# Patient Record
Sex: Female | Born: 1950 | Hispanic: Yes | Marital: Married | Smoking: Never smoker
Health system: Southern US, Community
[De-identification: ages and names within clinical notes are randomized; demographics above are authoritative.]

## PROBLEM LIST (undated history)

## (undated) DIAGNOSIS — H269 Unspecified cataract: Secondary | ICD-10-CM

## (undated) DIAGNOSIS — T7840XA Allergy, unspecified, initial encounter: Secondary | ICD-10-CM

## (undated) DIAGNOSIS — M19071 Primary osteoarthritis, right ankle and foot: Secondary | ICD-10-CM

## (undated) DIAGNOSIS — M810 Age-related osteoporosis without current pathological fracture: Secondary | ICD-10-CM

## (undated) DIAGNOSIS — M1990 Unspecified osteoarthritis, unspecified site: Secondary | ICD-10-CM

## (undated) DIAGNOSIS — H524 Presbyopia: Secondary | ICD-10-CM

## (undated) DIAGNOSIS — E7800 Pure hypercholesterolemia, unspecified: Secondary | ICD-10-CM

## (undated) DIAGNOSIS — H52203 Unspecified astigmatism, bilateral: Secondary | ICD-10-CM

## (undated) DIAGNOSIS — Z87442 Personal history of urinary calculi: Secondary | ICD-10-CM

## (undated) DIAGNOSIS — H5203 Hypermetropia, bilateral: Secondary | ICD-10-CM

## (undated) DIAGNOSIS — I10 Essential (primary) hypertension: Secondary | ICD-10-CM

## (undated) DIAGNOSIS — H11003 Unspecified pterygium of eye, bilateral: Secondary | ICD-10-CM

## (undated) DIAGNOSIS — I4891 Unspecified atrial fibrillation: Secondary | ICD-10-CM

## (undated) HISTORY — DX: Unspecified pterygium of eye, bilateral: H11.003

## (undated) HISTORY — PX: CHOLECYSTECTOMY: SHX55

## (undated) HISTORY — DX: Presbyopia: H52.4

## (undated) HISTORY — DX: Essential (primary) hypertension: I10

## (undated) HISTORY — DX: Age-related osteoporosis without current pathological fracture: M81.0

## (undated) HISTORY — DX: Unspecified osteoarthritis, unspecified site: M19.90

## (undated) HISTORY — DX: Unspecified cataract: H26.9

## (undated) HISTORY — DX: Unspecified astigmatism, bilateral: H52.203

## (undated) HISTORY — DX: Allergy, unspecified, initial encounter: T78.40XA

## (undated) HISTORY — DX: Hypermetropia, bilateral: H52.03

---

## 2004-05-18 ENCOUNTER — Inpatient Hospital Stay (HOSPITAL_COMMUNITY): Admission: EM | Admit: 2004-05-18 | Discharge: 2004-05-22 | Payer: Self-pay | Admitting: Emergency Medicine

## 2004-12-24 ENCOUNTER — Emergency Department (HOSPITAL_COMMUNITY): Admission: EM | Admit: 2004-12-24 | Discharge: 2004-12-24 | Payer: Self-pay | Admitting: Emergency Medicine

## 2007-01-23 LAB — CONVERTED CEMR LAB: Pap Smear: NORMAL

## 2007-09-11 ENCOUNTER — Telehealth (INDEPENDENT_AMBULATORY_CARE_PROVIDER_SITE_OTHER): Payer: Self-pay | Admitting: *Deleted

## 2007-09-11 DIAGNOSIS — M25579 Pain in unspecified ankle and joints of unspecified foot: Secondary | ICD-10-CM

## 2007-09-17 ENCOUNTER — Ambulatory Visit: Payer: Self-pay | Admitting: *Deleted

## 2007-09-17 ENCOUNTER — Ambulatory Visit: Payer: Self-pay | Admitting: Nurse Practitioner

## 2007-09-17 DIAGNOSIS — L84 Corns and callosities: Secondary | ICD-10-CM | POA: Insufficient documentation

## 2007-09-17 DIAGNOSIS — M2140 Flat foot [pes planus] (acquired), unspecified foot: Secondary | ICD-10-CM | POA: Insufficient documentation

## 2007-09-17 LAB — CONVERTED CEMR LAB
ALT: 31 units/L (ref 0–35)
AST: 28 units/L (ref 0–37)
Albumin: 4.3 g/dL (ref 3.5–5.2)
Alkaline Phosphatase: 95 units/L (ref 39–117)
BUN: 14 mg/dL (ref 6–23)
Basophils Absolute: 0 10*3/uL (ref 0.0–0.1)
Basophils Relative: 0 % (ref 0–1)
CO2: 21 meq/L (ref 19–32)
Calcium: 9.4 mg/dL (ref 8.4–10.5)
Chloride: 106 meq/L (ref 96–112)
Glucose, Bld: 85 mg/dL (ref 70–99)
Lymphs Abs: 1.6 10*3/uL (ref 0.7–3.3)
MCHC: 32.8 g/dL (ref 30.0–36.0)
Neutrophils Relative %: 67 % (ref 43–77)
RBC: 4.47 M/uL (ref 3.87–5.11)
RDW: 13.2 % (ref 11.5–14.0)
Rhuematoid fact SerPl-aCnc: <20 intl units/mL (ref 0–20)
WBC: 6.8 10*3/uL (ref 4.0–10.5)

## 2007-09-23 ENCOUNTER — Ambulatory Visit (HOSPITAL_COMMUNITY): Admission: RE | Admit: 2007-09-23 | Discharge: 2007-09-23 | Payer: Self-pay | Admitting: Nurse Practitioner

## 2007-10-01 ENCOUNTER — Ambulatory Visit: Payer: Self-pay | Admitting: Nurse Practitioner

## 2007-10-01 DIAGNOSIS — M949 Disorder of cartilage, unspecified: Secondary | ICD-10-CM

## 2007-10-01 DIAGNOSIS — F4310 Post-traumatic stress disorder, unspecified: Secondary | ICD-10-CM | POA: Insufficient documentation

## 2007-10-01 DIAGNOSIS — M79609 Pain in unspecified limb: Secondary | ICD-10-CM

## 2007-10-01 DIAGNOSIS — M899 Disorder of bone, unspecified: Secondary | ICD-10-CM | POA: Insufficient documentation

## 2007-10-07 ENCOUNTER — Ambulatory Visit (HOSPITAL_COMMUNITY): Admission: RE | Admit: 2007-10-07 | Discharge: 2007-10-07 | Payer: Self-pay | Admitting: Family Medicine

## 2007-10-07 ENCOUNTER — Encounter (INDEPENDENT_AMBULATORY_CARE_PROVIDER_SITE_OTHER): Payer: Self-pay | Admitting: Nurse Practitioner

## 2007-10-12 ENCOUNTER — Encounter (INDEPENDENT_AMBULATORY_CARE_PROVIDER_SITE_OTHER): Payer: Self-pay | Admitting: Nurse Practitioner

## 2007-10-20 ENCOUNTER — Ambulatory Visit: Payer: Self-pay | Admitting: Cardiology

## 2007-10-20 ENCOUNTER — Encounter (INDEPENDENT_AMBULATORY_CARE_PROVIDER_SITE_OTHER): Payer: Self-pay | Admitting: Nurse Practitioner

## 2007-10-20 ENCOUNTER — Ambulatory Visit (HOSPITAL_COMMUNITY): Admission: RE | Admit: 2007-10-20 | Discharge: 2007-10-20 | Payer: Self-pay | Admitting: Cardiology

## 2007-11-04 ENCOUNTER — Ambulatory Visit: Payer: Self-pay

## 2007-11-04 ENCOUNTER — Encounter: Payer: Self-pay | Admitting: Cardiology

## 2007-11-12 ENCOUNTER — Ambulatory Visit: Payer: Self-pay | Admitting: Cardiology

## 2007-11-12 ENCOUNTER — Encounter (INDEPENDENT_AMBULATORY_CARE_PROVIDER_SITE_OTHER): Payer: Self-pay | Admitting: Nurse Practitioner

## 2007-12-11 ENCOUNTER — Encounter (INDEPENDENT_AMBULATORY_CARE_PROVIDER_SITE_OTHER): Payer: Self-pay | Admitting: Nurse Practitioner

## 2007-12-11 DIAGNOSIS — I447 Left bundle-branch block, unspecified: Secondary | ICD-10-CM

## 2007-12-15 ENCOUNTER — Ambulatory Visit: Payer: Self-pay | Admitting: Nurse Practitioner

## 2007-12-15 DIAGNOSIS — I10 Essential (primary) hypertension: Secondary | ICD-10-CM

## 2008-01-09 ENCOUNTER — Emergency Department (HOSPITAL_COMMUNITY): Admission: EM | Admit: 2008-01-09 | Discharge: 2008-01-09 | Payer: Self-pay | Admitting: Emergency Medicine

## 2008-01-09 DIAGNOSIS — M479 Spondylosis, unspecified: Secondary | ICD-10-CM | POA: Insufficient documentation

## 2008-03-15 ENCOUNTER — Ambulatory Visit: Payer: Self-pay | Admitting: Nurse Practitioner

## 2008-03-15 LAB — CONVERTED CEMR LAB
Bilirubin Urine: NEGATIVE
KOH Prep: NEGATIVE
Ketones, urine, test strip: NEGATIVE
Pap Smear: NEGATIVE
Protein, U semiquant: NEGATIVE
Specific Gravity, Urine: <1.005
Urobilinogen, UA: 0.2

## 2008-03-16 ENCOUNTER — Ambulatory Visit (HOSPITAL_COMMUNITY): Admission: RE | Admit: 2008-03-16 | Discharge: 2008-03-16 | Payer: Self-pay | Admitting: Family Medicine

## 2008-03-16 ENCOUNTER — Encounter (INDEPENDENT_AMBULATORY_CARE_PROVIDER_SITE_OTHER): Payer: Self-pay | Admitting: Nurse Practitioner

## 2008-03-16 DIAGNOSIS — E785 Hyperlipidemia, unspecified: Secondary | ICD-10-CM

## 2008-03-16 LAB — CONVERTED CEMR LAB
ALT: 15 units/L (ref 0–35)
BUN: 18 mg/dL (ref 6–23)
Basophils Relative: 0 % (ref 0–1)
Calcium: 9.7 mg/dL (ref 8.4–10.5)
Chlamydia, DNA Probe: NEGATIVE
Eosinophils Absolute: 0.3 10*3/uL (ref 0.0–0.7)
Eosinophils Relative: 4 % (ref 0–5)
GC Probe Amp, Genital: NEGATIVE
HDL: 64 mg/dL (ref >39)
LDL Cholesterol: 162 mg/dL — ABNORMAL HIGH (ref 0–99)
Lymphs Abs: 1.5 10*3/uL (ref 0.7–4.0)
Monocytes Absolute: 0.6 10*3/uL (ref 0.1–1.0)
Neutro Abs: 4.3 10*3/uL (ref 1.7–7.7)
Neutrophils Relative %: 64 % (ref 43–77)
Platelets: 298 10*3/uL (ref 150–400)
RDW: 13.3 % (ref 11.5–15.5)
Sodium: 145 meq/L (ref 135–145)
Total CHOL/HDL Ratio: 3.9
Total Protein: 7.7 g/dL (ref 6.0–8.3)
Triglycerides: 127 mg/dL (ref <150)
VLDL: 25 mg/dL (ref 0–40)

## 2008-05-17 ENCOUNTER — Ambulatory Visit: Payer: Self-pay | Admitting: Nurse Practitioner

## 2008-05-17 DIAGNOSIS — R05 Cough: Secondary | ICD-10-CM | POA: Insufficient documentation

## 2008-06-15 ENCOUNTER — Ambulatory Visit: Payer: Self-pay | Admitting: Nurse Practitioner

## 2008-06-15 DIAGNOSIS — N200 Calculus of kidney: Secondary | ICD-10-CM

## 2008-06-15 LAB — CONVERTED CEMR LAB
Bilirubin Urine: NEGATIVE
Cholesterol, target level: 200 mg/dL
Specific Gravity, Urine: 1.015
Urobilinogen, UA: 0.2

## 2008-06-16 LAB — CONVERTED CEMR LAB
Alkaline Phosphatase: 84 units/L (ref 39–117)
Bilirubin, Direct: 0.1 mg/dL (ref 0.0–0.3)
Cholesterol: 215 mg/dL — ABNORMAL HIGH (ref 0–200)
HDL: 60 mg/dL (ref >39)
Total Bilirubin: 0.7 mg/dL (ref 0.3–1.2)
Total CHOL/HDL Ratio: 3.6
Total Protein: 7.3 g/dL (ref 6.0–8.3)
VLDL: 19 mg/dL (ref 0–40)

## 2008-08-16 ENCOUNTER — Telehealth (INDEPENDENT_AMBULATORY_CARE_PROVIDER_SITE_OTHER): Payer: Self-pay | Admitting: Internal Medicine

## 2008-08-17 ENCOUNTER — Encounter (INDEPENDENT_AMBULATORY_CARE_PROVIDER_SITE_OTHER): Payer: Self-pay | Admitting: Internal Medicine

## 2009-02-15 ENCOUNTER — Ambulatory Visit: Payer: Self-pay | Admitting: Nurse Practitioner

## 2009-02-15 DIAGNOSIS — R300 Dysuria: Secondary | ICD-10-CM | POA: Insufficient documentation

## 2009-02-15 LAB — CONVERTED CEMR LAB
Glucose, Urine, Semiquant: NEGATIVE
Ketones, urine, test strip: NEGATIVE
Nitrite: NEGATIVE
Protein, U semiquant: NEGATIVE
Urobilinogen, UA: 0.2
pH: 6.5

## 2009-02-16 ENCOUNTER — Encounter (INDEPENDENT_AMBULATORY_CARE_PROVIDER_SITE_OTHER): Payer: Self-pay | Admitting: Nurse Practitioner

## 2009-02-23 ENCOUNTER — Telehealth (INDEPENDENT_AMBULATORY_CARE_PROVIDER_SITE_OTHER): Payer: Self-pay | Admitting: *Deleted

## 2009-03-01 ENCOUNTER — Ambulatory Visit: Payer: Self-pay | Admitting: Nurse Practitioner

## 2009-03-01 LAB — CONVERTED CEMR LAB
ALT: 17 units/L (ref 0–35)
Albumin: 4.1 g/dL (ref 3.5–5.2)
Alkaline Phosphatase: 83 units/L (ref 39–117)
Basophils Absolute: 0 10*3/uL (ref 0.0–0.1)
Basophils Relative: 0 % (ref 0–1)
CO2: 17 meq/L — ABNORMAL LOW (ref 19–32)
Calcium: 8.9 mg/dL (ref 8.4–10.5)
Cholesterol: 229 mg/dL — ABNORMAL HIGH (ref 0–200)
Eosinophils Relative: 4 % (ref 0–5)
HCT: 41.1 % (ref 36.0–46.0)
HDL: 63 mg/dL (ref >39)
MCHC: 32.4 g/dL (ref 30.0–36.0)
Monocytes Relative: 6 % (ref 3–12)
Total Bilirubin: 0.6 mg/dL (ref 0.3–1.2)

## 2009-03-09 ENCOUNTER — Encounter (INDEPENDENT_AMBULATORY_CARE_PROVIDER_SITE_OTHER): Payer: Self-pay | Admitting: Nurse Practitioner

## 2009-03-29 ENCOUNTER — Ambulatory Visit: Payer: Self-pay | Admitting: Nurse Practitioner

## 2009-03-29 DIAGNOSIS — T148XXA Other injury of unspecified body region, initial encounter: Secondary | ICD-10-CM | POA: Insufficient documentation

## 2009-05-10 ENCOUNTER — Ambulatory Visit: Payer: Self-pay | Admitting: Nurse Practitioner

## 2009-05-10 ENCOUNTER — Encounter (INDEPENDENT_AMBULATORY_CARE_PROVIDER_SITE_OTHER): Payer: Self-pay | Admitting: Internal Medicine

## 2009-05-10 ENCOUNTER — Encounter (INDEPENDENT_AMBULATORY_CARE_PROVIDER_SITE_OTHER): Payer: Self-pay | Admitting: Nurse Practitioner

## 2009-05-10 DIAGNOSIS — E669 Obesity, unspecified: Secondary | ICD-10-CM

## 2009-05-10 LAB — CONVERTED CEMR LAB
Alkaline Phosphatase: 82 units/L (ref 39–117)
Chloride: 105 meq/L (ref 96–112)
Eosinophils Absolute: 0.2 10*3/uL (ref 0.0–0.7)
GC Probe Amp, Genital: NEGATIVE
Glucose, Bld: 70 mg/dL (ref 70–99)
Glucose, Urine, Semiquant: NEGATIVE
HDL: 62 mg/dL (ref >39)
Hemoglobin: 13.3 g/dL (ref 12.0–15.0)
KOH Prep: NEGATIVE
Ketones, urine, test strip: NEGATIVE
LDL Cholesterol: 113 mg/dL — ABNORMAL HIGH (ref 0–99)
MCHC: 32.8 g/dL (ref 30.0–36.0)
Microalb, Ur: 1.16 mg/dL (ref 0.00–1.89)
Monocytes Absolute: 0.4 10*3/uL (ref 0.1–1.0)
Monocytes Relative: 8 % (ref 3–12)
Potassium: 4.5 meq/L (ref 3.5–5.3)
Protein, U semiquant: NEGATIVE
RBC: 4.41 M/uL (ref 3.87–5.11)
Sodium: 142 meq/L (ref 135–145)
Specific Gravity, Urine: <1.005
TSH: 1.445 microintl units/mL (ref 0.350–4.500)
Total CHOL/HDL Ratio: 3.1
Total Protein: 7.5 g/dL (ref 6.0–8.3)
Triglycerides: 95 mg/dL (ref <150)
VLDL: 19 mg/dL (ref 0–40)
pH: 6.5

## 2009-05-11 ENCOUNTER — Encounter (INDEPENDENT_AMBULATORY_CARE_PROVIDER_SITE_OTHER): Payer: Self-pay | Admitting: Nurse Practitioner

## 2009-05-12 ENCOUNTER — Ambulatory Visit (HOSPITAL_COMMUNITY): Admission: RE | Admit: 2009-05-12 | Discharge: 2009-05-12 | Payer: Self-pay | Admitting: Family Medicine

## 2009-05-18 ENCOUNTER — Ambulatory Visit: Payer: Self-pay | Admitting: Internal Medicine

## 2009-07-05 ENCOUNTER — Encounter (INDEPENDENT_AMBULATORY_CARE_PROVIDER_SITE_OTHER): Payer: Self-pay | Admitting: Nurse Practitioner

## 2010-01-03 ENCOUNTER — Ambulatory Visit: Payer: Self-pay | Admitting: Nurse Practitioner

## 2010-01-03 DIAGNOSIS — H04129 Dry eye syndrome of unspecified lacrimal gland: Secondary | ICD-10-CM | POA: Insufficient documentation

## 2010-12-11 ENCOUNTER — Emergency Department (HOSPITAL_COMMUNITY)
Admission: EM | Admit: 2010-12-11 | Discharge: 2010-12-11 | Payer: Self-pay | Source: Home / Self Care | Admitting: Family Medicine

## 2011-01-22 NOTE — Assessment & Plan Note (Signed)
Summary: HTN/Hypercholesterolemia   Vital Signs:  Patient profile:   60 year old female Height:      59 inches Weight:      186 pounds BMI:     37.70 Temp:     98.1 degrees F oral Pulse rate:   57 / minute Pulse rhythm:   regular Resp:     18 per minute BP sitting:   145 / 77  (left arm) Cuff size:   large  Vitals Entered By: [name] [name] (January 03, 2010 10:28 AM) CC: refill meds.... pt said a couple of weeks ago she got dizzy...Marland Kitchen pt says she just got up to go to the bathroom and then she was dizzy...Marland KitchenMarland Kitchen pt says it might be cause she has not taken meds... pt has been out of meds for two months now....., Hypertension Management, Lipid Management Is Patient Diabetic? No Pain Assessment Patient in pain? no       Does patient need assistance? Functional Status Self care Ambulation Normal   CC:  refill meds.... pt said a couple of weeks ago she got dizzy...Marland Kitchen pt says she just got up to go to the bathroom and then she was dizzy...Marland KitchenMarland Kitchen pt says it might be cause she has not taken meds... pt has been out of meds for two months now....., Hypertension Management, and Lipid Management.  History of Present Illness:  Pt into the office for 6 month follow - up.  Pt here today with her daughter who interprets for her.  No medicatons for the past 2 months because she did not have refills on the meds.  Pt did go to the pharmacy but she was denied refills until this office visit.  Reviewed refill protocal with pt.  Hypertension History:      She denies headache, chest pain, and palpitations.  She notes no problems with any antihypertensive medication side effects.  Pt has been without her medications for the past 2 months.        Positive major cardiovascular risk factors include female age 10 years old or older, hyperlipidemia, and hypertension.  Negative major cardiovascular risk factors include no history of diabetes, negative family history for ischemic heart disease, and non-tobacco-user  status.        Further assessment for target organ damage reveals no history of ASHD, cardiac end-organ damage (CHF/LVH), stroke/TIA, peripheral vascular disease, renal insufficiency, or hypertensive retinopathy.    Lipid Management History:      Positive NCEP/ATP III risk factors include female age 72 years old or older and hypertension.  Negative NCEP/ATP III risk factors include non-diabetic, HDL cholesterol greater than 60, no family history for ischemic heart disease, non-tobacco-user status, no ASHD (atherosclerotic heart disease), no prior stroke/TIA, no peripheral vascular disease, and no history of aortic aneurysm.        The patient states that she does not know about the "Therapeutic Lifestyle Change" diet.  The patient does not know about adjunctive measures for cholesterol lowering.  She expresses no side effects from her lipid-lowering medication.  Comments include: Pt has not been taking her meds for the past 2 monts - needs refills.  The patient denies any symptoms to suggest myopathy or liver disease.      Current Medications (verified): 1)  Calcium Plus Vitamin D 600-100 Mg-Unit  Caps (Calcium Carbonate-Vitamin D) .Marland Kitchen.. 1 Tablet By Mouth By Mouth Two Times A Day For Bones 2)  Pravachol 40 Mg  Tabs (Pravastatin Sodium) .Marland Kitchen.. 1 Tablet By Mouth At Night For  Cholesterol 3)  Ultram 50 Mg  Tabs (Tramadol Hcl) .Marland Kitchen.. 1 Tablet By Mouth Daily As Needed For Pain 4)  Norvasc 10 Mg  Tabs (Amlodipine Besylate) .Marland Kitchen.. 1 Tablet By Mouth For Blood Pressure 5)  Alendronate Sodium 70 Mg Tabs (Alendronate Sodium) .Marland Kitchen.. 1 Tab By Mouth Weekly--Take On Empty Stomach.  Do Not Lie Down Afterward.  Do Not Eat or Take Other Meds For 1 Hour. 6)  Voltaren 1 % Gel (Diclofenac Sodium) .... Apply To Affected Area Two Times A Day As Needed For Pain  Allergies (verified): No Known Drug Allergies  Review of Systems General:  Denies fever. Eyes:  Complains of itching and red eye. CV:  Denies chest pain or  discomfort. Resp:  Denies cough. GI:  Denies abdominal pain, nausea, and vomiting. MS:  bil foot pain - flat feet.  Still wearing orthopedic shoes.  Physical Exam  General:  alert.   Head:  normocephalic.   Eyes:  conjunctival injection and pinguecula.   Lungs:  normal breath sounds.   Heart:  normal rate and regular rhythm.   Abdomen:  obese Msk:  normal ROM and pes planus.   Neurologic:  alert & oriented X3.     Impression & Recommendations:  Problem # 1:  HYPERTENSION, BENIGN ESSENTIAL (ICD-401.1) BP elevated today. DASH diet advised pt to restart on meds. Her updated medication list for this problem includes:    Norvasc 10 Mg Tabs (Amlodipine besylate) .Marland Kitchen... 1 tablet by mouth for blood pressure  Problem # 2:  DYSLIPIDEMIA (ICD-272.4) no need to check lipids today but pt has been off meds for 2 months advised her to restart meds Her updated medication list for this problem includes:    Pravachol 40 Mg Tabs (Pravastatin sodium) .Marland Kitchen... 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 1 tablet by mouth by mouth two times a day for bones 2)  Pravachol 40 Mg Tabs (Pravastatin sodium) .Marland Kitchen.. 1 tablet by mouth at night for cholesterol 3)  Ultram 50 Mg Tabs (Tramadol hcl) .Marland Kitchen.. 1 tablet by mouth daily as needed for pain 4)  Norvasc 10 Mg Tabs (Amlodipine besylate) .Marland Kitchen.. 1 tablet by mouth for blood pressure 5)  Alendronate Sodium 70 Mg Tabs (Alendronate sodium) .Marland Kitchen.. 1 tab by mouth weekly--take on empty stomach.  do not lie down afterward.  do not eat or take other meds for 1  hour. 6)  Voltaren 1 % Gel (Diclofenac sodium) .... Apply to affected area two times a day as needed for pain  Other Orders: Flu Vaccine 71yrs + (60454) Admin 1st Vaccine (09811) Admin 1st Vaccine [facility]) [phone])  Hypertension Assessment/Plan:      The patient's hypertensive risk group is category B: At least one risk factor (excluding diabetes) with no target organ damage.  Her calculated 10 year risk of coronary heart disease is 9 %.  Today's blood pressure is 145/77.  Her blood pressure goal is < 140/90.  Lipid Assessment/Plan:      Based on NCEP/ATP III, the patient's risk factor category is "0-1 risk factors".  The patient's lipid goals are as follows: Total  cholesterol goal is 200; LDL cholesterol goal is 160; HDL cholesterol goal is 40; Triglyceride goal is 150.    Patient Instructions: 1)  Restart your medications for cholesterol and bones.  2)  Schedule an appointment in 6 months for a complete physical exam. Do not eat before this visit.  You will need fasting labs. cbc, lipids, cmp, rapid hiv, tsh. 3)  You will get mammogram, PHQ-9, EKG,  4)  Dry eyes - likely due to dry heat in the house. 5)  Be sure that the filter in the heat is changed. 6)  Use either a humidifier or boil water on the stove to put some humidity in the air. 7)  May use saliene eye drops or Clear eyes Prescriptions: ALENDRONATE SODIUM 70 MG TABS (ALENDRONATE SODIUM) 1 tab by mouth weekly--take on empty stomach.  Do not lie down afterward.  Do not eat or take other meds for 1 hour.  #4 Each x 5   Entered and Authorized by:   Lehman Prom FNP   Signed by:   Lehman Prom FNP on 01/03/2010   Method used:   Print then Give to Patient   RxID:   [id number] ULTRAM 50 MG  TABS (TRAMADOL HCL) 1 tablet by mouth daily as needed for pain  #30 x 0   Entered and Authorized by:   Lehman Prom FNP   Signed by:   Lehman Prom FNP on 01/03/2010   Method used:   Faxed to ...       [facility]retail)       [address] [place], [place]  [address]       Ph: [phone]       Fax: [phone]   RxID:   [id number] NORVASC 10 MG  TABS (AMLODIPINE BESYLATE) 1 tablet by mouth for blood pressure  #30 x 6   Entered and Authorized by:   Lehman Prom FNP   Signed by:   Lehman Prom FNP on 01/03/2010   Method used:   Faxed to ...       [facility] (retail)       [address] [place], [place]  [address]       Ph: [phone]       Fax: [phone]   RxID:   [id number] PRAVACHOL 40 MG  TABS (PRAVASTATIN SODIUM) 1 tablet by mouth at night for cholesterol  #30 x 6   Entered and Authorized by:   Lehman Prom FNP   Signed by:   Lehman Prom FNP on 01/03/2010   Method used:   Faxed to ...       [facility] (retail)       [address] [place], [place]  [address]       Ph: [phone]       Fax: [phone]   RxID:   [id number]    Influenza Vaccine    Vaccine Type: Fluvax 3+    Site: right deltoid    Mfr: San[employer]i Pasteur    Dose: 0.5 ml    Route: IM    Given by: [name] [name]    Exp. Date: 06/21/2010    Lot #: [id number]    VIS given: 07/16/07 version given January 03, 2010.  Flu Vaccine Consent Questions    Do you have a history of severe  allergic reactions to this vaccine? no    Any prior history of allergic reactions to egg and/or gelatin? no    Do you have a sensitivity to the preservative Thimersol? no    Do you have a past history of Guillan-Barre Syndrome? no    Do you currently have an acute febrile illness? no    Have you ever had a severe reaction to latex? no    Vaccine information given and explained to patient? yes    Are you currently pregnant? no

## 2011-02-21 ENCOUNTER — Telehealth (INDEPENDENT_AMBULATORY_CARE_PROVIDER_SITE_OTHER): Payer: Self-pay | Admitting: Nurse Practitioner

## 2011-02-26 ENCOUNTER — Encounter (INDEPENDENT_AMBULATORY_CARE_PROVIDER_SITE_OTHER): Payer: Self-pay | Admitting: Internal Medicine

## 2011-03-05 NOTE — Progress Notes (Signed)
Summary: Query:  Refill alendronate?  Phone Note Outgoing Call   Summary of Call: Last seen 12/2009.  Refill alendronate or call pt. to make appt. before refill? Initial call taken by: Dutch Quint RN,  February 21, 2011 3:18 PM  Follow-up for Phone Call        med refilled it has been 1 year since pt's last appt so she needs an office visit Follow-up by: Lehman Prom FNP,  February 22, 2011 10:08 AM  Additional Follow-up for Phone Call Additional follow up Details #1::        Voicemail box has not been set up yet.  No message left.  Dutch Quint RN  February 25, 2011 4:40 PM  Voicemail box has not been set up yet.  No message left.  Letter sent.  Dutch Quint RN  February 26, 2011 2:44 PM

## 2011-03-05 NOTE — Letter (Signed)
Summary: Generic Letter  [facility]  [address] [place], [place] [address]   Phone: [phone]  Fax: [phone]        02/26/2011  Verdie Shire [address] [place], [place]  [address]  Dear Ms. VAZQUEZ-GARCIA,  Nosotros hemos trato de comunicarnos por telefono y no hemos podido.  Por favor llamar a la oficina, a su tiempo conveniente, para hablar con usted.  Sinceramente,  Dutch Quint RN

## 2011-05-07 NOTE — Assessment & Plan Note (Signed)
[facility]                            CARDIOLOGY OFFICE NOTE   NAME:[name], [name]                        MRN:          [id number]  DATE:11/12/2007                            DOB:          Apr 04, 1951    I had seen [name] [name] on October 20, 2007.  Her blood pressure  was elevated.  She had some shoulder discomfort.  We added Benazepril 10  mg and arranged for a chest x-ray.  This was done at [facility].  She  may have more than one medical record number.  We did obtain the result.  There was no acute abnormality.  She has some interstitial coarsening.  She has some scar versus atelectasis at the left base; however, there  was no acute abnormality.  She did have a 2D echo.  This study was done  on November 04, 2007 and showed good LV function.  There were no major  valvular abnormalities.   The patient returns today, and she is feeling better.  Also, her blood  pressure is much better.  She does not speak [place].  She is here with  her daughter, who can communicate well.   PAST MEDICAL HISTORY:  Other medical problems, see the list below.   ALLERGIES:  No known drug allergies.   MEDICATIONS:  Benazepril, piroxicam.   REVIEW OF SYSTEMS:  She feels much better, and her review of systems is  negative.   PHYSICAL EXAMINATION:  Blood pressure today is significantly improved at  127/75.  Her weight is down 2 pounds to 200.  Pulse is 61.  Patient is oriented to person, time, and place, and her affect is  normal.  I communicated with her through her daughter in the room.  LUNGS:  Clear.  Respiratory effort is not labored.  CARDIAC:  An S1 with an S2.  There are no clicks or significant murmurs.  ABDOMEN:  Soft.  She has no masses or bruits.  EXTREMITIES:  There is no peripheral edema.   PROBLEMS:  1. Normal left ventricular function.  2. Left bundle branch block.  3. Hypertension, treated.  4. Status post shortness of breath and a cough  that is improved.  No      further cardiac workup is needed.  I will see her as needed over      time at the request of [facility].    [name] Abed, MD, [facility]  Electronically Signed   JDK/MedQ  DD: 11/12/2007  DT: 11/12/2007  Job #: [id number]   cc:   [facility]

## 2011-05-07 NOTE — Assessment & Plan Note (Signed)
[facility]                            CARDIOLOGY OFFICE NOTE   NAME:[name]                MRN:          [id number]  DATE:10/20/2007                            DOB:          Oct 25, 1951    [name] is here for the evaluation of some discomfort in her  left shoulder and arm and also because her blood pressure is elevated  and because she has some shortness of breath and a cough. The  respiratory difficulty occurs at anytime. It does not appear to be  exertional. She does not have PND or orthopnea. She is also here because  her EKG is abnormal with an underlying left bundle branch block.   PAST MEDICAL HISTORY:   ALLERGIES:  No known drug allergies.   MEDICATIONS:  Feldene for arthritis. Also she has had calcium and Celexa  ordered but she has not started it.   OTHER MEDICAL PROBLEMS:  See the list below.   SOCIAL HISTORY:  The patient has 10 children. She does not smoke. She  does not drink.   FAMILY HISTORY:  There is no strong family history of coronary disease.   REVIEW OF SYSTEMS:  See the HPI.   PHYSICAL EXAMINATION:  Weight is 202 pounds, blood pressure is 179/93.  Pulse is 76.  The patient is oriented to person, time and place. The history is  obtained through using interpreters and her daughter.  HEENT:  Reveals no xanthelasma. She has normal extraocular motion. There  are no carotid bruits. There is no jugular venous distention.  LUNGS:  Clear. Respiratory effort is not labored.  CARDIAC:  Reveals an S1 with an S2. There are no clicks or significant  murmurs.  ABDOMEN:  Obese but soft.  She has no significant peripheral edema.   EKG reveals left bundle branch block.   LABORATORY DATA:  Her labs done in September 2008 through [facility]  revealed a hemoglobin of 13.5, potassium 3.9, BUN 14, creatinine 0.49.  TSH was normal.   PROBLEM LIST:  1. Hypertension. We will start Benazepril 10 mg daily.  2.  Other musculoskeletal problems that are being evaluated by      [facility].  3. Shortness of breath and cough. She needs a chest x-ray.  4. Left bundle branch block. We need a 2-D echo to assess her LV      function better. I will start her on Benazepril and then I will see      her for followup.     [name] Abed, MD, [facility]  Electronically Signed    JDK/MedQ  DD: 10/20/2007  DT: 10/21/2007  Job #: [id number]   cc:   Dala Dock

## 2011-05-24 ENCOUNTER — Inpatient Hospital Stay (INDEPENDENT_AMBULATORY_CARE_PROVIDER_SITE_OTHER)
Admission: RE | Admit: 2011-05-24 | Discharge: 2011-05-24 | Disposition: A | Discharge status: Home / Self Care | Payer: Self-pay | Source: Ambulatory Visit | Attending: Emergency Medicine | Admitting: Emergency Medicine

## 2011-05-24 DIAGNOSIS — I10 Essential (primary) hypertension: Secondary | ICD-10-CM

## 2011-05-24 LAB — POCT I-STAT, CHEM 8
BUN: 21 mg/dL (ref 6–23)
HCT: 40 % (ref 36.0–46.0)
Potassium: 4 mEq/L (ref 3.5–5.1)
Sodium: 140 mEq/L (ref 135–145)

## 2011-09-12 LAB — URINE CULTURE

## 2011-09-12 LAB — POCT URINALYSIS DIP (DEVICE)
Nitrite: NEGATIVE
Operator id: 270961
Urobilinogen, UA: 0.2
pH: 7

## 2013-07-12 ENCOUNTER — Ambulatory Visit: Payer: Self-pay | Admitting: Family Medicine

## 2013-07-12 VITALS — BP 174/82 | HR 77 | Temp 97.8°F | Resp 18 | Ht 60.0 in | Wt 186.0 lb

## 2013-07-12 DIAGNOSIS — I10 Essential (primary) hypertension: Secondary | ICD-10-CM

## 2013-07-12 DIAGNOSIS — M25569 Pain in unspecified knee: Secondary | ICD-10-CM

## 2013-07-12 LAB — BASIC METABOLIC PANEL
Calcium: 9.3 mg/dL (ref 8.4–10.5)
Glucose, Bld: 89 mg/dL (ref 70–99)
Sodium: 137 mEq/L (ref 135–145)

## 2013-07-12 MED ORDER — LISINOPRIL 10 MG PO TABS: 10.0000 mg | ORAL_TABLET | Freq: Every day | ORAL | Status: DC

## 2013-07-12 MED ORDER — PIROXICAM 20 MG PO CAPS: 20.0000 mg | ORAL_CAPSULE | Freq: Every day | ORAL | Status: DC

## 2013-07-12 NOTE — Progress Notes (Signed)
[facility] [address], [place] [address] [phone]  Date:  07/12/2013   Name:  [name] [name]   DOB:  Mar 18, 1951   MRN:  [id number]  PCP:  Julieanne Manson, MD    Chief Complaint: Hypertension and Dizziness   History of Present Illness:  [name] [name] is a 62 y.o. very pleasant female patient who presents with the following:  She has been out of her BP medications for more than 6 months. She has been on BP medications for a long time- she was on benazepril but has run out.  However, the benazepril is expensive and she would like to change to something else if possible  She is post- menopausal She has been on celexa in the past but no longer feels that she needs this.   She is here with her family today.  No other concerns except she has also used feldene in the past for occasional MSK pains and would like to have some more of this if possible.    Last BMP in 2012 per epic  Patient Active Problem List   Diagnosis Date Noted  . 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  . OBESITY 05/10/2009  . STRAIN 03/29/2009  . DYSURIA 02/15/2009  . NEPHROLITHIASIS 06/15/2008  . COUGH 05/17/2008  . DYSLIPIDEMIA 03/16/2008  . 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 HYPERTENSION, BENIGN ESSENTIAL 12/15/2007  . BUNDLE BRANCH BLOCK, LEFT 12/11/2007  . PTSD 10/01/2007  . ARM PAIN, LEFT 10/01/2007  . OSTEOPENIA 10/01/2007  . CALLUSES, FEET, BILATERAL 09/17/2007  . PES PLANUS 09/17/2007  . PAIN IN JOINT, ANKLE/FOOT 09/11/2007    Past Medical History  Diagnosis Date  . Arthritis   . Osteoporosis     History reviewed. No pertinent past surgical history.  History  Substance Use Topics  . Smoking status: Never Smoker   . Smokeless tobacco: Not on file  . Alcohol Use: No    History reviewed. No pertinent family history.  No Known Allergies  Medication list has been reviewed and updated.  No current outpatient prescriptions on file prior to  visit.   No current facility-administered medications on file prior to visit.    Review of Systems:  As per HPI- otherwise negative.   Physical Examination: Filed Vitals:   07/12/13 1258  BP: 174/82  Pulse: 77  Temp: 97.8 F (36.6 C)  Resp: 18   Filed Vitals:   07/12/13 1258  Height: 5' (1.524 m)  Weight: 186 lb (84.369 kg)   Body mass index is 36.33 kg/(m^2). Ideal Body Weight: Weight in (lb) to have BMI = 25: 127.7  GEN: WDWN, NAD, Non-toxic, A & O x 3, obese HEENT: Atraumatic, Normocephalic. Neck supple. No masses, No LAD. Ears and Nose: No external deformity. CV: RRR, No M/G/R. No JVD. No thrill. No extra heart sounds. PULM: CTA B, no wheezes, crackles, rhonchi. No retractions. No resp. distress. No accessory muscle use. ABD: S, NT, ND. No rebound. No HSM. EXTR: No c/c/e NEURO Normal gait.  PSYCH: Normally interactive. Conversant. Not depressed or anxious appearing.  Calm demeanor.    Assessment and Plan: HTN (hypertension) - Plan: Basic metabolic panel, lisinopril (PRINIVIL,ZESTRIL) 10 MG tablet, DISCONTINUED: lisinopril (PRINIVIL,ZESTRIL) 10 MG tablet  Pain in joint, lower leg, unspecified laterality - Plan: piroxicam (FELDENE) 20 MG capsule, DISCONTINUED: piroxicam (FELDENE) 20 MG capsule  Restart BP medication; the benazapril has been expensive for her, so will change to lisinopril. Asked her to check her BP at home and record her  readings, and plan to come back in one month for a recheck  She has used feldene on occasion for MSK pain. Gave her some more of these but asked her to use sparingly as they can raise her BP.   Signed Abbe Amsterdam, MD

## 2013-07-13 ENCOUNTER — Encounter: Payer: Self-pay | Admitting: Family Medicine

## 2013-09-03 ENCOUNTER — Ambulatory Visit: Payer: No Typology Code available for payment source | Attending: Internal Medicine

## 2013-09-27 ENCOUNTER — Ambulatory Visit: Payer: No Typology Code available for payment source | Attending: Internal Medicine | Admitting: Internal Medicine

## 2013-09-27 ENCOUNTER — Other Ambulatory Visit (HOSPITAL_COMMUNITY)
Admission: RE | Admit: 2013-09-27 | Discharge: 2013-09-27 | Disposition: A | Discharge status: Home / Self Care | Payer: No Typology Code available for payment source | Source: Ambulatory Visit | Attending: Internal Medicine | Admitting: Internal Medicine

## 2013-09-27 ENCOUNTER — Encounter: Payer: Self-pay | Admitting: Internal Medicine

## 2013-09-27 VITALS — BP 154/82 | HR 58 | Temp 97.8°F | Resp 17

## 2013-09-27 DIAGNOSIS — Z01419 Encounter for gynecological examination (general) (routine) without abnormal findings: Secondary | ICD-10-CM | POA: Insufficient documentation

## 2013-09-27 DIAGNOSIS — Z23 Encounter for immunization: Secondary | ICD-10-CM

## 2013-09-27 DIAGNOSIS — Z0000 Encounter for general adult medical examination without abnormal findings: Secondary | ICD-10-CM

## 2013-09-27 DIAGNOSIS — E785 Hyperlipidemia, unspecified: Secondary | ICD-10-CM

## 2013-09-27 DIAGNOSIS — M79609 Pain in unspecified limb: Secondary | ICD-10-CM | POA: Insufficient documentation

## 2013-09-27 DIAGNOSIS — N63 Unspecified lump in unspecified breast: Secondary | ICD-10-CM

## 2013-09-27 DIAGNOSIS — Z1151 Encounter for screening for human papillomavirus (HPV): Secondary | ICD-10-CM | POA: Insufficient documentation

## 2013-09-27 DIAGNOSIS — I10 Essential (primary) hypertension: Secondary | ICD-10-CM

## 2013-09-27 DIAGNOSIS — M2140 Flat foot [pes planus] (acquired), unspecified foot: Secondary | ICD-10-CM | POA: Insufficient documentation

## 2013-09-27 DIAGNOSIS — M2141 Flat foot [pes planus] (acquired), right foot: Secondary | ICD-10-CM

## 2013-09-27 MED ORDER — LISINOPRIL 20 MG PO TABS: 20.0000 mg | ORAL_TABLET | Freq: Every day | ORAL | Status: DC

## 2013-09-27 MED ORDER — SIMVASTATIN 20 MG PO TABS: 20.0000 mg | ORAL_TABLET | Freq: Every day | ORAL | Status: DC

## 2013-09-27 NOTE — Progress Notes (Signed)
Patient here to establish care Has history of HTN

## 2013-09-27 NOTE — Progress Notes (Signed)
Patient ID: [name] [name], female   DOB: December 28, 1950, 62 y.o.   MRN: [id number]   CC:  Feet hurt, flat feet.  Establish care.  HPI: The patient comes in today to establish care. Her main complaints are bilateral foot pain secondary to her history of flat feet. Her past medical history is reviewed and she had a fasting lipid panel done 05/10/2009 with elevated cholesterol at that time. She's been intermittently on medications for cholesterol control.  No Known Allergies Past Medical History  Diagnosis Date  . Arthritis   . Osteoporosis   . Hypertension    Current Outpatient Prescriptions on File Prior to Visit  Medication Sig Dispense Refill  . lisinopril (PRINIVIL,ZESTRIL) 10 MG tablet Take 1 tablet (10 mg total) by mouth daily.  90 tablet  3  . piroxicam (FELDENE) 20 MG capsule Take 1 capsule (20 mg total) by mouth daily. As needed for leg pains. Spanish label please  30 capsule  0  . risedronate (ACTONEL) 35 MG tablet Take 35 mg by mouth every 7 (seven) days. with water on empty stomach, nothing by mouth or lie down for next 30 minutes.       No current facility-administered medications on file prior to visit.   History reviewed. No pertinent family history. History   Social History  . Marital Status: Married    Spouse Name: N/A    Number of Children: N/A  . Years of Education: N/A   Occupational History  . Not on file.   Social History Main Topics  . Smoking status: Never Smoker   . Smokeless tobacco: Not on file  . Alcohol Use: No  . Drug Use: No  . Sexual Activity: Not on file   Other Topics Concern  . Not on file   Social History Narrative  . No narrative on file    Review of Systems: Constitutional: No fever, no chills;  Appetite normal; No weight loss. HEENT: No blurry vision, no diplopia, no pharyngitis, no dysphagia CV: No chest pain, no palpitations.  Resp: No SOB, no cough. GI: No N/V, no diarrhea, no melena, no hematochezia.  GU: No dysuria, hematuria,  no frequency, no hesitancy.  MSK: no myalgias/arthralgias except in her feet.  Neuro:  No headache, no focal neurological deficits.  Psych: No depression, no anxiety.  Endo: No heat intolerance, no cold intolerance, no excessive thirst, no excessive urination.  Skin: No rashes, no skin lesions.  Heme: No fatigue, no easy bruising   Objective:   Filed Vitals:   09/27/13 0913  BP: 154/82  Pulse: 58  Temp: 97.8 F (36.6 C)  Resp: 17    Physical Exam  Constitutional: Appears well-developed and well-nourished. No distress.  HENT: Normocephalic. External right and left ear normal. Oropharynx is clear and moist.  Eyes: Conjunctivae and EOM are normal. PERRLA, no scleral icterus.  Neck: Normal ROM. Neck supple. No JVD. No tracheal deviation. No thyromegaly.  CVS: RRR, S1/S2 +, no murmurs, no gallops, no carotid bruit.  Breasts: Normal female breasts with no axillary lymphadenopathy, nipple discharge, or masses except for a small area of skin thickening below the right breast, outer quadrant. Pulmonary: Effort and breath sounds normal, no stridor, rhonchi, wheezes, rales.  Abdominal: Soft. BS +,  no distension, tenderness, rebound or guarding. GYN: Normal external female genitalia. Normal cervix. No cervical motion tenderness or abnormal discharge.  Musculoskeletal: Normal range of motion. No edema and no tenderness.  Neuro: Alert. Normal reflexes, muscle tone coordination. No cranial nerve  deficit. Skin: Skin is warm and dry. No rash noted. Not diaphoretic. No erythema. No pallor.  Psychiatric: Normal mood and affect. Behavior, judgment, thought content normal.   Lab Results  Component Value Date   WBC 5.0 05/10/2009   HGB 13.6 05/24/2011   HCT 40.0 05/24/2011   MCV 91.8 05/10/2009   PLT 291 05/10/2009   Lab Results  Component Value Date   CREATININE 0.47* 07/12/2013   BUN 20 07/12/2013   NA 137 07/12/2013   K 4.0 07/12/2013   CL 104 07/12/2013   CO2 23 07/12/2013    No results found for  this basename: HGBA1C   Lipid Panel     Component Value Date/Time   CHOL 194 05/10/2009 2128   TRIG 95 05/10/2009 2128   HDL 62 05/10/2009 2128   CHOLHDL 3.1 Ratio 05/10/2009 2128   VLDL 19 05/10/2009 2128   LDLCALC 113* 05/10/2009 2128       Assessment and plan:  1. Bilateral flatfeet: Referral made to podiatry for consideration of shoe inserts. 2. Breast lump: Referred for mammography. Area of skin thickening noted. 3. Dyslipidemia: Patient was started on simvastatin 20 mg daily. She will need a repeat lipid panel and chemistries done in 6 weeks. 4. Hypertension: Patient's Prinivil was increased to 20 mg daily for better blood pressure control.   Routine Health Maintenance   Ophthalmology Exam: Reports she is up to date.  Colon Cancer Screening annually 50-75 with stool cards/Colonoscopy Q 10 years: Referral made 09/27/2013.  Lipid Screening Q 5 years:  Scheduled 11/14.  DM Screening >45 Q 3 years:  Hemoglobin A1c ordered for 11/14.  Mammogram annually in women > 40: Scheduled.  Breast Exam annually: 09/27/2013  PAP annually 21-30, Q 3 years > 30: 09/27/2013  Flu vaccine: 09/27/2013  Return to the clinic: 6 weeks for blood work, blood pressure check.  Signed:  Dr. Trula Ore Caeleb Batalla 09/27/2013 9:20 AM

## 2013-09-27 NOTE — Patient Instructions (Signed)
Flat Feet Having flat feet is a common condition. One foot or both might be affected. People of any age can have flat feet. In fact, everyone is born with them. But most of the time, the foot gradually develops an arch. That is the curve on the bottom of the foot that creates a gap between the foot and the ground. An arch usually develops in childhood. Sometimes, though, an arch never develops and the foot stays flat on the bottom. Other times, an arch develops but later collapses (caves in). That is what gives the condition its nickname, "fallen arches." The medical term for flat feet is pes planus. Some people have flat feet their whole life and have no problems. For others, the condition causes pain and needs to be corrected.  CAUSES   A problem with the foot's soft tissue; tendons and ligaments could be loose.  This can cause what is called flexible flat feet. That means the shape of the foot changes with pressure. When standing on the toes, a curved arch can be seen. When standing on the ground, the foot is flat.  Wear and tear. Sometimes arches simply flatten over time.  Damage to the posterior tibial tendon. This is the tendon that goes from the inside of the ankle to the bones in the middle of the foot. It is the main support for the arch. If the tendon is injured, stretched or torn, the arch might flatten.  Tarsal coalition. With this condition, two or more bones in the foot are joined together (fused ) during development in the womb. This limits movement and can lead to a flat foot. SYMPTOMS   The foot is even with the ground from toe to heel. Your caregiver will look closely at the inside of the foot while you are standing.  Pain along the bottom of the foot. Some people describe the pain as tightness.  Swelling on the inside of the foot or ankle.  Changes in the way you walk (gait).  The feet lean inward, starting at the ankle (pronation). DIAGNOSIS  To decide if a child or  adult has flat feet, a healthcare provider will probably:  Do a physical examination. This might include having the person stand on his or her toes and then stand normally. The caregiver will also hold the foot and put pressure on the foot in different directions.  Check the person's shoes. The pattern of wear on the soles can offer clues.  Order images (pictures) of the foot. They can help identify the cause of any pain. They also will show injuries to bones or tendons that could be causing the condition. The images can come from:  X-rays.  Computed tomography (CT) scan. This combines X-ray and a computer.  Magnetic resonance imaging (MRI). This uses magnets, radio waves and a computer to take a picture of the foot. It is the best technique to evaluate tendons, ligaments and muscles. TREATMENT   Flexible flat feet usually are painless. Most of the time, gait is not affected. Most children grow out of the condition. Often no treatment is needed. If there is pain, treatment options include:  Orthotics. These are inserts that go in the shoes. They add support and shape to the feet. An orthotic is custom-made from a mold of the foot.  Shoes. Not all shoes are the same. People with flat feet need arch support. However, too much can be painful. It is important to find shoes that offer the right amount   of support. Athletes, especially runners, may need to try shoes made just for people with flatter feet.  Medication. For pain, only take over-the-counter medicine for pain, discomfort, as directed by your caregiver.  Rest. If the feet start to hurt, cut back on the exercise which increases the pain. Use common sense.  For damage to the posterior tibial tendon, options include:  Orthotics. Also adding a wedge on the inside edge may help. This can relieve pressure on the tendon.  Ankle brace, boot or cast. These supports can ease the load on the tendon while it heals.  Surgery. If the tendon is  torn, it might need to be repaired.  For tarsal coalition, similar options apply:  Pain medication.  Orthotics.  A cast and crutches. This keeps weight off the foot.  Physical therapy.  Surgery to remove the bone bridge joining the two bones together. PROGNOSIS  In most people, flat feet do not cause pain or problems. People can go about their normal activities. However, if flat feet are painful, they can and should be treated. Treatment usually relieves the pain. HOME CARE INSTRUCTIONS   Take any medications prescribed by the healthcare provider. Follow the directions carefully.  Wear, or make sure a child wears, orthotics or special shoes if this was suggested. Be sure to ask how often and for how long they should be worn.  Do any exercises or therapy treatments that were suggested.  Take notes on when the pain occurs. This will help healthcare providers decide how to treat the condition.  If surgery is needed, be sure to find out if there is anything that should or should not be done before the operation. SEEK MEDICAL CARE IF:   Pain worsens in the foot or lower leg.  Pain disappears after treatment, but then returns.  Walking or simple exercise becomes difficult or causes foot pain.  Orthotics or special shoes are uncomfortable or painful. Document Released: 10/06/2009 Document Revised: 03/02/2012 Document Reviewed: 10/06/2009 Exit[employer] Patient Information 2014 [employer].  

## 2013-09-30 ENCOUNTER — Telehealth: Payer: Self-pay

## 2013-09-30 NOTE — Telephone Encounter (Signed)
[name] [name]  Can you call the patient and tell her her pap smear results Thank you

## 2013-09-30 NOTE — Telephone Encounter (Signed)
Message copied by Lestine Mount on Thu Sep 30, 2013  3:15 PM ------      Message from: RAMA, Trula Ore P      Created: Thu Sep 30, 2013  2:50 PM       Please let the patient know that her Pap smear was negative for cancer but did show some hormone related thinning of the vaginal tissues which can cause vaginitis. If she has any trouble with vaginal discomfort, please have her notify us so that we can prescribe something to help with this. ------

## 2013-09-30 NOTE — Progress Notes (Signed)
Quick Note:  Please let the patient know that her Pap smear was negative for cancer but did show some hormone related thinning of the vaginal tissues which can cause vaginitis. If she has any trouble with vaginal discomfort, please have her notify us so that we can prescribe something to help with this. ______

## 2013-10-05 NOTE — Telephone Encounter (Signed)
Pt aware of her results 

## 2013-10-07 ENCOUNTER — Ambulatory Visit: Payer: Self-pay | Admitting: Podiatry

## 2013-10-08 ENCOUNTER — Ambulatory Visit: Payer: No Typology Code available for payment source | Attending: Internal Medicine

## 2013-10-08 NOTE — Progress Notes (Unsigned)
  Subjective:    Patient ID: [name] [name], female    DOB: 1951-11-28, 62 y.o.   MRN: [id number]  HPI    Review of Systems     Objective:   Physical Exam        Assessment & Plan:  Pt came in for BP recheck. BP 161/75/ 63 Pt instructed to continue taking prescribed bp meds. States she has been feeling very stressful lately. Barbaraann Boys interpretor for [employer]

## 2013-11-08 ENCOUNTER — Ambulatory Visit: Payer: No Typology Code available for payment source | Attending: Internal Medicine

## 2013-11-08 DIAGNOSIS — Z0000 Encounter for general adult medical examination without abnormal findings: Secondary | ICD-10-CM

## 2013-11-08 DIAGNOSIS — E785 Hyperlipidemia, unspecified: Secondary | ICD-10-CM

## 2013-11-08 LAB — HEMOGLOBIN A1C: Mean Plasma Glucose: 114 mg/dL (ref <117)

## 2013-11-08 LAB — COMPREHENSIVE METABOLIC PANEL
ALT: 16 U/L (ref 0–35)
Alkaline Phosphatase: 75 U/L (ref 39–117)
CO2: 28 mEq/L (ref 19–32)
Calcium: 9.7 mg/dL (ref 8.4–10.5)
Creat: 0.58 mg/dL (ref 0.50–1.10)
Sodium: 138 mEq/L (ref 135–145)
Total Bilirubin: 0.6 mg/dL (ref 0.3–1.2)

## 2013-11-08 LAB — LIPID PANEL
Cholesterol: 247 mg/dL — ABNORMAL HIGH (ref 0–200)
HDL: 62 mg/dL (ref >39)
Total CHOL/HDL Ratio: 4 Ratio
VLDL: 22 mg/dL (ref 0–40)

## 2013-11-08 NOTE — Progress Notes (Unsigned)
  Subjective:    Patient ID: [name] [name], female    DOB: [birth year], 62 y.o.   MRN: [id number]  HPI    Review of Systems     Objective:   Physical Exam        Assessment & Plan:  Pt here for BP recheck BP 126/72 Denies pain at this time Labs drawn

## 2014-02-10 ENCOUNTER — Encounter: Payer: Self-pay | Admitting: Internal Medicine

## 2014-02-10 ENCOUNTER — Ambulatory Visit: Payer: No Typology Code available for payment source | Attending: Internal Medicine | Admitting: Internal Medicine

## 2014-02-10 VITALS — BP 146/86 | HR 77 | Temp 97.8°F | Resp 14 | Ht 59.0 in | Wt 184.4 lb

## 2014-02-10 DIAGNOSIS — R109 Unspecified abdominal pain: Secondary | ICD-10-CM | POA: Insufficient documentation

## 2014-02-10 DIAGNOSIS — I10 Essential (primary) hypertension: Secondary | ICD-10-CM | POA: Insufficient documentation

## 2014-02-10 DIAGNOSIS — R110 Nausea: Secondary | ICD-10-CM | POA: Insufficient documentation

## 2014-02-10 DIAGNOSIS — Z79899 Other long term (current) drug therapy: Secondary | ICD-10-CM | POA: Insufficient documentation

## 2014-02-10 DIAGNOSIS — R197 Diarrhea, unspecified: Secondary | ICD-10-CM

## 2014-02-10 LAB — CBC WITH DIFFERENTIAL/PLATELET
BASOS ABS: 0 10*3/uL (ref 0.0–0.1)
BASOS PCT: 0 % (ref 0–1)
EOS ABS: 0.2 10*3/uL (ref 0.0–0.7)
Eosinophils Relative: 3 % (ref 0–5)
HCT: 43 % (ref 36.0–46.0)
Hemoglobin: 14.8 g/dL (ref 12.0–15.0)
Lymphocytes Relative: 15 % (ref 12–46)
Lymphs Abs: 0.8 10*3/uL (ref 0.7–4.0)
MCH: 31.6 pg (ref 26.0–34.0)
MCHC: 34.4 g/dL (ref 30.0–36.0)
MCV: 91.9 fL (ref 78.0–100.0)
MONOS PCT: 11 % (ref 3–12)
Monocytes Absolute: 0.6 10*3/uL (ref 0.1–1.0)
NEUTROS ABS: 3.7 10*3/uL (ref 1.7–7.7)
NEUTROS PCT: 71 % (ref 43–77)
PLATELETS: 261 10*3/uL (ref 150–400)
RBC: 4.68 MIL/uL (ref 3.87–5.11)
RDW: 13.5 % (ref 11.5–15.5)
WBC: 5.2 10*3/uL (ref 4.0–10.5)

## 2014-02-10 LAB — LIPID PANEL
Cholesterol: 216 mg/dL — ABNORMAL HIGH (ref 0–200)
HDL: 64 mg/dL (ref >39)
LDL Cholesterol: 135 mg/dL — ABNORMAL HIGH (ref 0–99)
Total CHOL/HDL Ratio: 3.4 Ratio
Triglycerides: 84 mg/dL (ref <150)
VLDL: 17 mg/dL (ref 0–40)

## 2014-02-10 LAB — COMPLETE METABOLIC PANEL WITH GFR
ALBUMIN: 4.2 g/dL (ref 3.5–5.2)
ALT: 32 U/L (ref 0–35)
AST: 32 U/L (ref 0–37)
Alkaline Phosphatase: 83 U/L (ref 39–117)
BILIRUBIN TOTAL: 0.5 mg/dL (ref 0.2–1.2)
BUN: 14 mg/dL (ref 6–23)
CO2: 24 mEq/L (ref 19–32)
Calcium: 9.7 mg/dL (ref 8.4–10.5)
Chloride: 105 mEq/L (ref 96–112)
Creat: 0.56 mg/dL (ref 0.50–1.10)
GFR, Est African American: >89 mL/min
GLUCOSE: 100 mg/dL — AB (ref 70–99)
POTASSIUM: 3.5 meq/L (ref 3.5–5.3)
Sodium: 140 mEq/L (ref 135–145)
TOTAL PROTEIN: 7.5 g/dL (ref 6.0–8.3)

## 2014-02-10 LAB — LIPASE: Lipase: 32 U/L (ref 0–75)

## 2014-02-10 MED ORDER — PANTOPRAZOLE SODIUM 40 MG PO TBEC: 40.0000 mg | DELAYED_RELEASE_TABLET | Freq: Every day | ORAL | Status: DC

## 2014-02-10 MED ORDER — PROMETHAZINE HCL 25 MG PO TABS: 12.5000 mg | ORAL_TABLET | Freq: Three times a day (TID) | ORAL | Status: DC | PRN

## 2014-02-10 NOTE — Progress Notes (Signed)
Patient is here for abdominal pain and diarrhea. Patient has a history of hypertension. BP today is 146/86. Complains of dizziness, abdominal pain, headaches, nausea, vomiting, and diarrhea. Patient has been on hypertension medication x2 months. Also complains of a Rt side sore throat and dry cough x2 months. Patient has an interpreter.

## 2014-02-10 NOTE — Progress Notes (Signed)
Patient ID: [name] [name], female   DOB: September 01, 1951, 63 y.o.   MRN: [id number]   CC:  HPI: Patient presents with a chief complaint of diarrhea since Sunday associated with nausea but no vomiting. She describes stooling every few minutes. She has been taking her blood pressure medication. She is able to keep liquids down. She has been taking Pepto-Bismol for the diarrhea. She has diffuse abdominal pain. No recent history of antibiotics or hospitalizations.  She denies any hematochezia melena. She is normotensive today.  No Known Allergies Past Medical History  Diagnosis Date  . Arthritis   . Osteoporosis   . Hypertension    Current Outpatient Prescriptions on File Prior to Visit  Medication Sig Dispense Refill  . lisinopril (PRINIVIL,ZESTRIL) 20 MG tablet Take 1 tablet (20 mg total) by mouth daily.  90 tablet  3  . piroxicam (FELDENE) 20 MG capsule Take 1 capsule (20 mg total) by mouth daily. As needed for leg pains. Spanish label please  30 capsule  0  . risedronate (ACTONEL) 35 MG tablet Take 35 mg by mouth every 7 (seven) days. with water on empty stomach, nothing by mouth or lie down for next 30 minutes.      . simvastatin (ZOCOR) 20 MG tablet Take 1 tablet (20 mg total) by mouth at bedtime.  90 tablet  3   No current facility-administered medications on file prior to visit.   No family history on file. History   Social History  . Marital Status: Married    Spouse Name: N/A    Number of Children: N/A  . Years of Education: N/A   Occupational History  . Not on file.   Social History Main Topics  . Smoking status: Never Smoker   . Smokeless tobacco: Not on file  . Alcohol Use: No  . Drug Use: No  . Sexual Activity: Not on file   Other Topics Concern  . Not on file   Social History Narrative  . No narrative on file    Review of Systems  Constitutional: Negative for fever, chills, diaphoresis, activity change, appetite change and fatigue.  HENT: Negative for  ear pain, nosebleeds, congestion, facial swelling, rhinorrhea, neck pain, neck stiffness and ear discharge.   Eyes: Negative for pain, discharge, redness, itching and visual disturbance.  Respiratory: Negative for cough, choking, chest tightness, shortness of breath, wheezing and stridor.   Cardiovascular: Negative for chest pain, palpitations and leg swelling.  Gastrointestinal: As in history of present illness  Genitourinary: Negative for dysuria, urgency, frequency, hematuria, flank pain, decreased urine volume, difficulty urinating and dyspareunia.  Musculoskeletal: Negative for back pain, joint swelling, arthralgias and gait problem.  Neurological: Negative for dizziness, tremors, seizures, syncope, facial asymmetry, speech difficulty, weakness, light-headedness, numbness and headaches.  Hematological: Negative for adenopathy. Does not bruise/bleed easily.  Psychiatric/Behavioral: Negative for hallucinations, behavioral problems, confusion, dysphoric mood, decreased concentration and agitation.    Objective:   Filed Vitals:   02/10/14 1450  BP: 146/86  Pulse: 77  Temp: 97.8 F (36.6 C)  Resp: 14    Physical Exam  Constitutional: Appears well-developed and well-nourished. No distress.  HENT: Normocephalic. External right and left ear normal. Oropharynx is clear and moist.  Eyes: Conjunctivae and EOM are normal. PERRLA, no scleral icterus.  Neck: Normal ROM. Neck supple. No JVD. No tracheal deviation. No thyromegaly.  CVS: RRR, S1/S2 +, no murmurs, no gallops, no carotid bruit.  Pulmonary: Effort and breath sounds normal, no stridor, rhonchi, wheezes, rales.  Abdominal: Soft. BS +,  diffusely tender, rebound or guarding.  Musculoskeletal: Normal range of motion. No edema and no tenderness.  Lymphadenopathy: No lymphadenopathy noted, cervical, inguinal. Neuro: Alert. Normal reflexes, muscle tone coordination. No cranial nerve deficit. Skin: Skin is warm and dry. No rash noted. Not  diaphoretic. No erythema. No pallor.  Psychiatric: Normal mood and affect. Behavior, judgment, thought content normal.   Lab Results  Component Value Date   WBC 5.0 05/10/2009   HGB 13.6 05/24/2011   HCT 40.0 05/24/2011   MCV 91.8 05/10/2009   PLT 291 05/10/2009   Lab Results  Component Value Date   CREATININE 0.58 11/08/2013   BUN 12 11/08/2013   NA 138 11/08/2013   K 4.6 11/08/2013   CL 103 11/08/2013   CO2 28 11/08/2013    Lab Results  Component Value Date   HGBA1C 5.6 11/08/2013   Lipid Panel     Component Value Date/Time   CHOL 247* 11/08/2013 1002   TRIG 110 11/08/2013 1002   HDL 62 11/08/2013 1002   CHOLHDL 4.0 11/08/2013 1002   VLDL 22 11/08/2013 1002   LDLCALC 163* 11/08/2013 1002       Assessment and plan:   Patient Active Problem List   Diagnosis Date Noted  . 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  . OBESITY 05/10/2009  . STRAIN 03/29/2009  . DYSURIA 02/15/2009  . NEPHROLITHIASIS 06/15/2008  . COUGH 05/17/2008  . DYSLIPIDEMIA 03/16/2008  . 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 HYPERTENSION, BENIGN ESSENTIAL 12/15/2007  . BUNDLE BRANCH BLOCK, LEFT 12/11/2007  . PTSD 10/01/2007  . ARM PAIN, LEFT 10/01/2007  . OSTEOPENIA 10/01/2007  . CALLUSES, FEET, BILATERAL 09/17/2007  . PES PLANUS 09/17/2007  . PAIN IN JOINT, ANKLE/FOOT 09/11/2007   Acute infectious diarrhea Most likely secondary to an noro virus Stool culture, C. difficile PCR will be obtained, sample container provided    Phenergan for nausea If Abdominal pain and diarrhea continues we'll order a CT abdomen pelvis  Hypertension Patient advised to hold lisinopril to avoid acute renal failure Patient advised to hydrate herself aggressively and resume lisinopril when diarrhea resolves   Followup in a couple of months   The patient was given clear instructions to go to ER or return to medical center if symptoms don't improve, worsen or new problems develop. The patient verbalized understanding.  The patient was told to call to get any lab results if not heard anything in the next week.

## 2014-02-11 ENCOUNTER — Telehealth: Payer: Self-pay | Admitting: *Deleted

## 2014-02-11 LAB — CLOSTRIDIUM DIFFICILE BY PCR: Toxigenic C. Difficile by PCR: NOT DETECTED

## 2014-02-11 NOTE — Telephone Encounter (Signed)
Tried contacting the patient with an interpreter. Was unable to reach the patient and leave a voicemail.

## 2014-02-11 NOTE — Telephone Encounter (Signed)
Message copied by Lazette Estala, [place] R on Fri Feb 11, 2014  5:17 PM ------      Message from: Susie CassetteABROL MD, Germain OsgoodNAYANA      Created: Fri Feb 11, 2014  4:33 PM       Notify patient of the patient's labs are within normal limits and the stool study was negative for C. difficile ------

## 2014-02-14 LAB — STOOL CULTURE

## 2014-02-21 ENCOUNTER — Ambulatory Visit: Payer: No Typology Code available for payment source

## 2014-04-05 ENCOUNTER — Ambulatory Visit: Payer: Self-pay | Admitting: Internal Medicine

## 2014-04-28 ENCOUNTER — Ambulatory Visit: Payer: Self-pay

## 2014-05-19 ENCOUNTER — Ambulatory Visit: Payer: Self-pay | Attending: Internal Medicine

## 2014-07-28 ENCOUNTER — Ambulatory Visit: Payer: Self-pay | Attending: Internal Medicine | Admitting: Internal Medicine

## 2014-07-28 ENCOUNTER — Encounter: Payer: Self-pay | Admitting: Internal Medicine

## 2014-07-28 VITALS — BP 160/80 | HR 70 | Temp 99.0°F | Resp 14 | Ht 59.0 in | Wt 183.0 lb

## 2014-07-28 DIAGNOSIS — K2970 Gastritis, unspecified, without bleeding: Secondary | ICD-10-CM | POA: Insufficient documentation

## 2014-07-28 DIAGNOSIS — M1990 Unspecified osteoarthritis, unspecified site: Secondary | ICD-10-CM | POA: Insufficient documentation

## 2014-07-28 DIAGNOSIS — I10 Essential (primary) hypertension: Secondary | ICD-10-CM | POA: Insufficient documentation

## 2014-07-28 DIAGNOSIS — Z0000 Encounter for general adult medical examination without abnormal findings: Secondary | ICD-10-CM | POA: Insufficient documentation

## 2014-07-28 DIAGNOSIS — H11009 Unspecified pterygium of unspecified eye: Secondary | ICD-10-CM | POA: Insufficient documentation

## 2014-07-28 DIAGNOSIS — H11003 Unspecified pterygium of eye, bilateral: Secondary | ICD-10-CM

## 2014-07-28 DIAGNOSIS — K2990 Gastroduodenitis, unspecified, without bleeding: Secondary | ICD-10-CM

## 2014-07-28 DIAGNOSIS — E785 Hyperlipidemia, unspecified: Secondary | ICD-10-CM | POA: Insufficient documentation

## 2014-07-28 LAB — POCT RAPID STREP A (OFFICE): Rapid Strep A Screen: NEGATIVE

## 2014-07-28 MED ORDER — PANTOPRAZOLE SODIUM 40 MG PO TBEC: 40.0000 mg | DELAYED_RELEASE_TABLET | Freq: Every day | ORAL | Status: DC

## 2014-07-28 MED ORDER — LISINOPRIL 20 MG PO TABS: 20.0000 mg | ORAL_TABLET | Freq: Every day | ORAL | Status: DC

## 2014-07-28 MED ORDER — SIMVASTATIN 20 MG PO TABS: 20.0000 mg | ORAL_TABLET | Freq: Every day | ORAL | Status: DC

## 2014-07-28 NOTE — Progress Notes (Signed)
Pt is here following up on her HTN. Pt reports having a strong cough w/ abdomen pain and hoarseness. Flu like symptoms w/ diarrhea. Pt states that she is not getting hungry and feel weak.

## 2014-07-28 NOTE — Progress Notes (Signed)
Patient ID: [name] [name], female   DOB: 09/15/1951, 63 y.o.   MRN: [id number]   [name] [name], is a 63 y.o. female  [id number]  [id number]  DOB - 05/23/1951  Chief Complaint  Patient presents with  . Follow-up        Subjective:   [name] [name] is a 63 y.o. female here today for a follow up visit. Pt is here following up on her HTN.  Pt reports having a strong cough w/ abdomen pain and hoarseness of voice. She has been having on and off diarrhea since February. She has been worked up with no significant findings except that she has not had colonoscopy done. Pt states that she is not getting hungry and feel weak. Her medical history is significant for hypertension, dyslipidemia, osteoporosis. Patient has No headache, No chest pain, No abdominal pain - No Nausea, No new weakness tingling or numbness, No Cough - SOB.  Problem  Preventative Health Care  Essential Hypertension  Pterygium  Dyslipidemia  Gastritis    ALLERGIES: No Known Allergies  PAST MEDICAL HISTORY: Past Medical History  Diagnosis Date  . Arthritis   . Osteoporosis   . Hypertension     MEDICATIONS AT HOME: Prior to Admission medications   Medication Sig Start Date End Date Taking? Authorizing Provider  lisinopril (PRINIVIL,ZESTRIL) 20 MG tablet Take 1 tablet (20 mg total) by mouth daily. 07/28/14  Yes Jeanann Lewandowskylugbemiga Reeshemah Nazaryan, MD  pantoprazole (PROTONIX) 40 MG tablet Take 1 tablet (40 mg total) by mouth daily. 07/28/14   Jeanann Lewandowskylugbemiga Keria Widrig, MD  piroxicam (FELDENE) 20 MG capsule Take 1 capsule (20 mg total) by mouth daily. As needed for leg pains. Spanish label please 07/12/13   Pearline CablesJessica C Copland, MD  promethazine (PHENERGAN) 25 MG tablet Take 0.5 tablets (12.5 mg total) by mouth every 8 (eight) hours as needed for nausea or vomiting. 02/10/14   Richarda OverlieNayana Abrol, MD  risedronate (ACTONEL) 35 MG tablet Take 35 mg by mouth every 7 (seven) days. with water on empty stomach, nothing by mouth or lie down  for next 30 minutes.    Historical Provider, MD  simvastatin (ZOCOR) 20 MG tablet Take 1 tablet (20 mg total) by mouth at bedtime. 07/28/14  Yes Jeanann Lewandowskylugbemiga Julio Storr, MD     Objective:   Filed Vitals:   07/28/14 1153  BP: 160/80  Pulse: 70  Temp: 99 F (37.2 C)  TempSrc: Oral  Resp: 14  Height: 4\' 11"  (1.499 m)  Weight: 183 lb (83.008 kg)  SpO2: 94%    Exam General appearance : Awake, alert, not in any distress. Speech Clear. Not toxic looking HEENT: Atraumatic and Normocephalic, pupils equally reactive to light and accomodation Neck: supple, no JVD. No cervical lymphadenopathy.  Chest:Good air entry bilaterally, no added sounds  CVS: S1 S2 regular, no murmurs.  Abdomen: Bowel sounds present, Non tender and not distended with no gaurding, rigidity or rebound. Extremities: B/L Lower Ext shows no edema, both legs are warm to touch Neurology: Awake alert, and oriented X 3, CN II-XII intact, Non focal Skin:No Rash Wounds:N/A  Data Review Lab Results  Component Value Date   HGBA1C 5.6 11/08/2013     Assessment & Plan   1. Preventative health care  - Rapid Strep A - MM Digital Screening; Future - Colonoscopy - Ambulatory referral to gastroenterology for screening colonoscopy and chronic diarrhea  2. Essential hypertension  - lisinopril (PRINIVIL,ZESTRIL) 20 MG tablet; Take 1 tablet (20 mg total) by mouth daily.  Dispense: 90 tablet; Refill:  3  3. Dyslipidemia  - simvastatin (ZOCOR) 20 MG tablet; Take 1 tablet (20 mg total) by mouth at bedtime.  Dispense: 90 tablet; Refill: 3  4. Pterygium, bilateral  - Ambulatory referral to Ophthalmology  5. Gastritis  - pantoprazole (PROTONIX) 40 MG tablet; Take 1 tablet (40 mg total) by mouth daily.  Dispense: 30 tablet; Refill: 3  Patient was counseled extensively about nutrition and exercise  Interpreter was used to communicate directly with patient for the entire encounter including providing detailed patient instructions.    Return in about 3 months (around 10/28/2014), or if symptoms worsen or fail to improve, for Follow up HTN, Follow up Pain and comorbidities.  The patient was given clear instructions to go to ER or return to medical center if symptoms don't improve, worsen or new problems develop. The patient verbalized understanding. The patient was told to call to get lab results if they haven't heard anything in the next week.   This note has been created with Education officer, environmental. Any transcriptional errors are unintentional.    Jeanann Lewandowsky, MD, MHA, FACP, FAAP [facility] [place], [place] [phone]   07/28/2014, 12:56 PM

## 2014-07-28 NOTE — Patient Instructions (Signed)
Gastritis - Adultos  (Gastritis, Adult)  La gastrittis es la irritacin (inflamacin) de la membrana interna del estmago. Puede ser Neomia Dear enfermedad de inicio sbito (aguda) o de largo plazo (crnica). Si la gastritis no se trata, puede causar sangrado y lceras. CAUSAS  La gastritis se produce cuando la membrana que tapiza interiormente al estmago se debilita o se daa. Los jugos digestivos del estmago inflaman el revestimiento del estmago debilitado. El revestimiento del estmago puede debilitarse o daarse por una infeccin viral o bacteriana. La infeccin bacteriana ms comn es la infeccin por Helicobacter pylori. Tambin puede ser el resultado del consumo excesivo de alcohol, por el uso de ciertos medicamentos o porque hay demasiado cido en el estmago.  SNTOMAS  En algunos casos no hay sntomas. Si se presentan sntomas, stos pueden ser:   Dolor o sensacin de ardor en la parte superior del abdomen.  Nuseas.  Vmitos.  Sensacin molesta de distensin despus de comer. DIAGNSTICO  El mdico puede diagnosticar gastritis segn los sntomas y el examen fsico. Para determinar la causa de la gastritis, el mdico podr:   Pedir anlisis de sangre o de materia fecal para diagnosticar la presencia de la bacteria H pylori.  Gastroscopa. Un tubo delgado y flexible (endoscopio) se pasa por Theatre stage manager al [employer]. El endoscopio tiene [place] luz y una cmara en el extremo. El mdico utilizar el endoscopio para observar el interior del [place].  Tomar una muestra de tejido (biopsia) del estmago para examinarlo en el microscopio. TRATAMIENTO  Segn la causa de la gastritis podrn recetarle: Antibiticos, si la causa es una infeccin bacteriana, como una infeccin por H. pylori. Anticidos o bloqueadores H2, si hay demasiado cido en el estmago. El [employer] aconsejar que deje de tomar aspirina, ibuprofeno u otros antiinflamatorios no esteroides (AINE).  INSTRUCCIONES PARA EL  CUIDADO EN EL HOGAR   Tome slo medicamentos de venta libre o recetados, segn las indicaciones del mdico.  Si le han recetado antibiticos, tmelos segn las indicaciones. Tmelos todos, aunque se sienta mejor.  Debe ingerir gran cantidad de lquido para mantener la orina de tono claro o color amarillo plido.  Evite las comidas y bebidas que [address] [place], [place]:  [place] con cafena o alcohlicas.  Chocolate.  Sabores a Advertising account planner.  Ajo y cebolla.  Comidas muy condimentadas.  Ctricos como naranjas, limones o limas.  Alimentos que contengan tomate, como salsas, [place] y pizza.  Alimentos fritos y Lexicographer.  Haga comidas pequeas durante Glass blower/designer de 3 comidas abundantes. SOLICITE ATENCIN MDICA DE INMEDIATO SI:   La materia fecal es negra o de color rojo oscuro.  Vomita sangre de color rojo brillante o material similar a granos de caf.  No puede retener los lquidos.  El dolor abdominal empeora.  Tiene fiebre.  No mejora luego de 1 semana.  Tiene preguntas o preocupaciones. ASEGRESE DE QUE:   Comprende estas instrucciones.  Controlar su enfermedad.  Solicitar ayuda de inmediato si no mejora o si empeora. Document Released: 09/18/2005 Document Revised: 09/02/2012 [facility] Patient Information 2015 [place], [place]. This information is not intended to replace advice given to you by your health care provider. Make sure you discuss any questions you have with your health care provider. Plan de alimentacin DASH (DASH Eating Plan) DASH es la sigla en ingls de "Enfoques Alimentarios para Detener la Hipertensin". El plan de alimentacin DASH ha demostrado bajar la presin arterial elevada (hipertensin). Los beneficios adicionales para la salud pueden incluir la disminucin del  riesgo de diabetes mellitus tipo2, enfermedades cardacas e ictus. Este plan tambin puede ayudar a Geophysical data processor. QU DEBO SABER ACERCA DEL PLAN DE ALIMENTACIN DASH? Para el plan  de alimentacin DASH, seguir las siguientes pautas generales:  Elija los alimentos con un valor porcentual diario de sodio de menos del 5% (segn figura en la etiqueta del alimento).  Use hierbas o aderezos sin sal, en lugar de sal de mesa o sal marina.  Consulte al mdico o farmacutico antes de usar sustitutos de la sal.  Coma productos con bajo contenido de sodio, cuya etiqueta suele decir "bajo contenido de sodio" o "sin agregado de sal".  Coma alimentos frescos.  Coma ms verduras, frutas y productos lcteos con bajo contenido de [place].  Elija los cereales integrales. Busque la palabra "integral" en Estate agent de la lista de ingredientes.  Elija el pescado y el pollo o el pavo sin piel ms a menudo que las carnes rojas. Limite el consumo de pescado, carne de ave y carne a 6onzas (170g) por Futures trader.  Limite el consumo de dulces, postres, azcares y bebidas azucaradas.  Elija las grasas saludables para el corazn.  Limite el consumo de queso a 1onza (28g) por Futures trader.  Consuma ms comida casera y menos de restaurante, de buf y comida rpida.  Limite el consumo de alimentos fritos.  Cocine los alimentos utilizando mtodos que no sean la fritura.  Limite las verduras enlatadas. Si las consume, enjuguelas bien para disminuir el sodio.  Cuando coma en un restaurante, pida que preparen su comida con menos sal o, en lo posible, sin nada de sal. QU ALIMENTOS PUEDO COMER? Pida ayuda a un nutricionista para conocer las necesidades calricas individuales. Cereales Pan de salvado o integral. Arroz integral. Pastas de salvado o integrales. Quinua, trigo burgol y cereales integrales. Cereales con bajo contenido de sodio. Tortillas de harina de maz o de salvado. Pan de maz integral. Galletas saladas integrales. Galletas con bajo contenido de [place]. Vegetales Verduras frescas o congeladas (crudas, al vapor, asadas o grilladas). Jugos de tomate y verduras con contenido bajo o  reducido de sodio. Pasta y salsa de tomate con contenido bajo o reducido de sodio. Verduras enlatadas con bajo contenido de sodio o reducido de sodio.  Nils Pyle Nils Pyle frescas, en conserva (en su jugo natural) o frutas congeladas. Carnes y otros productos con protenas Carne de res molida (al 85% o ms [place]), carne de res de animales alimentados con pastos o carne de res sin la grasa. Pollo o pavo sin piel. Carne de pollo o de [place]. Cerdo sin la grasa. Todos los pescados y frutos de mar. Huevos. Porotos, guisantes o lentejas secos. Frutos secos y semillas sin sal. Frijoles enlatados sin sal. Lcteos Productos lcteos con bajo contenido de grasas, como [place] o al 1%, quesos reducidos en grasas o al 2%, ricota con bajo contenido de grasas o [employer], o yogur natural con bajo contenido de [place]. Quesos con contenido bajo o reducido de sodio. Grasas y Writer en barra que no contengan grasas trans. [place] y alios para ensaladas livianos o reducidos en grasas (reducidos en sodio). [place]. Aceites de crtamo, oliva o canola. [place] natural de man o almendra. Otros Palomitas de maz y pretzels sin sal. Los artculos mencionados arriba pueden no ser [employer] de las bebidas o los alimentos recomendados. Comunquese con el nutricionista para conocer ms opciones. QU ALIMENTOS NO SE RECOMIENDAN? Cereales Pan blanco. Pastas blancas. Arroz blanco. Pan de maz refinado. Bagels y  croissants. Galletas saladas que contengan grasas trans. Vegetales Vegetales con crema o fritos. Verduras en salsa de [place]. Verduras enlatadas comunes. Pasta y salsa de tomate en lata comunes. Jugos comunes de tomate y de verduras. Nils PyleFrutas Frutas secas. Fruta enlatada en almbar liviano o espeso. Jugo de frutas. Carnes y otros productos con protenas Cort[place] de carne con Holiday representativegrasa. Costillas, alas de pollo, tocineta, salchicha, mortadela, salame, chinchulines, tocino, perros  calientes, salchichas alemanas y embutidos envasados. Frutos secos y semillas con sal. Frijoles con sal en lata. Lcteos Leche entera o al 2%, crema, mezcla de [place] y crema, y queso crema. Yogur entero o endulzado. Quesos o queso azul con alto contenido de Neurosurgeongrasas. [place]rem[place] no lcteas y coberturas batidas. Quesos procesados, quesos para untar o cuajadas. Condimentos Sal de cebolla y ajo, sal condimentada, sal de mesa y sal marina. Salsas en lata y envasadas. Salsa [place]. Salsa trtara. Salsa barbacoa. Salsa teriyaki. Salsa de soja, incluso la que tiene contenido reducido de [place] Bloom[place]odio. Salsa de carne. Salsa de pescado. Salsa de Belleroseostras. Salsa rosada. Rbano picante. Ketchup y mostaza. Saborizantes y tiernizantes para carne. Caldo en cubitos. Salsa picante. Salsa tabasco. Adobos. Aderezos para tacos. Salsas. Grasas y [address] [place], Indiamargarina en barra, Southside Placemanteca de Nelsonvillecerdo, Cockrell Hillgrasa, Singaporemantequilla clarificada y Steffanie Rainwatergrasa de tocino. Aceites de coco, de palmiste o de palma. Aderezos comunes para ensalada. Otros Pickles y Jordan Hillaceitunas. Palomitas de maz y pretzels con sal. Los artculos mencionados arriba pueden no ser Raytheonuna lista completa de las bebidas y los alimentos que se Theatre stage managerdeben evitar. Comunquese con el nutricionista para obtener ms informacin. DNDE Raelyn MoraPUEDO ENCONTRAR MS INFORMACIN? Instituto Nacional del Muirorazn, del Pulmn y de la Sangre (National Heart, Lung, and Blood Institute): CablePromo.itwww.nhlbi.nih.gov/health/health-topics/topics/dash/ Document Released: 11/28/2011 Document Revised: 04/25/2014 [facility] Patient Information 2015 [place], [place]. This information is not intended to replace advice given to you by your health care provider. Make sure you discuss any questions you have with your health care provider. Hipertensin (Hypertension) La hipertensin, conocida comnmente como presin arterial alta, se produce cuando la sangre bombea en las arterias con mucha fuerza. Las arterias son los vasos sanguneos  que transportan la sangre desde el corazn hacia todas las partes del cuerpo. Una lectura de la presin arterial consiste en un nmero ms alto sobre un nmero ms bajo, por ejemplo, 110/72. El nmero ms alto (presin sistlica) corresponde a la presin interna de las arterias cuando el corazn Darbyvillebombea sangre. El nmero ms bajo (presin diastlica) corresponde a la presin interna de las arterias cuando el corazn se relaja. En condiciones ideales, la presin arterial debe ser inferior a 120/80. La hipertensin fuerza al corazn a trabajar ms para Marine scientistbombear la sangre. Las arterias pueden estrecharse o ponerse rgidas. La hipertensin conlleva el riesgo de enfermedad cardaca, ictus y otros problemas.  FACTORES DE RIESGO Algunos factores de riesgo de hipertensin son controlables, pero otros no lo son.  DynegyEntre los factores de riesgo que usted no puede Chief Operating Officercontrolar, se incluyen:   Nurse, learning disabilityLa raza. El riesgo es mayor para las Statisticianpersonas afroamericanas.  La edad. Los riesgos aumentan con la edad.  El sexo. Antes de los 45aos, los hombres corren ms Goodyear Tireriesgo que las mujeres. Despus de los 65aos, las mujeres corren ms Lexmark Internationalriesgo que los hombres. Entre los factores de riesgo que usted puede Chief Operating Officercontrolar, se incluyen:  No hacer la cantidad suficiente de actividad fsica o ejercicio.  Tener sobrepeso.  Consumir mucha grasa, azcar, caloras o sal en la dieta.  Beber alcohol en exceso. SIGNOS Y SNTOMAS Por lo general, la  hipertensin no causa signos o sntomas. La hipertensin demasiado alta (crisis hipertensiva) puede causar dolor de cabeza, ansiedad, falta de aire y hemorragia nasal. DIAGNSTICO  Para detectar si usted tiene hipertensin, el mdico le medir la presin arterial mientras est sentado, con el brazo levantado a la altura del corazn. Debe medirla al [facility] veces en el mismo brazo. Determinadas condiciones pueden causar una diferencia de presin arterial entre el brazo izquierdo y Aeronautical engineer. El hecho  de tener una sola lectura de la presin arterial ms alta que lo normal no significa que Research scientist (physical sciences). En el caso de tener una lectura de la presin arterial con un valor alto, pdale al mdico que la verifique nuevamente. TRATAMIENTO  El tratamiento de la hipertensin arterial incluye hacer cambios en el estilo de vida y, posiblemente, tomar medicamentos. Un estilo de vida saludable puede ayudar a bajar la presin arterial alta. Quiz deba cambiar algunos hbitos. Los [employer] Hughes [employer]orporated en el estilo de vida pueden incluir:  Seguir la dieta DASH. Esta dieta tiene un alto contenido de frutas, verduras y Radiation protection practitioner. Incluye poca cantidad de sal, carnes rojas y azcares agregados.  Hacer al menos 2horas de actividad fsica enrgica todas las semanas.  Perder peso, si es necesario.  No fumar.  Limitar el consumo de bebidas alcohlicas.  Aprender formas de reducir el estrs. Si los cambios en el estilo de vida no son suficientes para Museum/gallery curator la presin arterial, el mdico puede recetarle medicamentos. Quiz necesite tomar ms de uno. Trabaje en conjunto con su mdico para comprender los riesgos y los beneficios. INSTRUCCIONES PARA EL CUIDADO EN EL HOGAR  Haga que le midan de nuevo la presin arterial segn las indicaciones del mdico.  Tome los medicamentos solamente como se lo haya indicado el mdico. Siga cuidadosamente las indicaciones. Los medicamentos para la presin arterial deben tomarse segn las indicaciones. Los medicamentos pierden eficacia al omitir las dosis. El hecho de omitir las dosis tambin [place] el riesgo de otros problemas.  No fume.  Contrlese la presin arterial en su casa segn las indicaciones del mdico. SOLICITE ATENCIN MDICA SI:   Piensa que tiene una reaccin alrgica a los medicamentos.  Tiene mareos o dolores de cabeza con Naval architect.  Tiene hinchazn en los tobillos.  Tiene problemas de visin. SOLICITE ATENCIN MDICA DE  INMEDIATO SI:  Siente un dolor de cabeza intenso o confusin.  Siente debilidad inusual, adormecimiento o que Hospital doctor.  Siente dolor intenso en el pecho o en el abdomen.  Vomita repetidas veces.  Tiene dificultad para respirar. ASEGRESE DE QUE:   Comprende estas instrucciones.  Controlar su afeccin.  Recibir ayuda de inmediato si no mejora o si empeora. Document Released: 12/09/2005 Document Revised: 04/25/2014 [facility] Patient Information 2015 [place], [place]. This information is not intended to replace advice given to you by your health care provider. Make sure you discuss any questions you have with your health care provider.

## 2014-07-30 LAB — CULTURE, GROUP A STREP: ORGANISM ID, BACTERIA: NORMAL

## 2014-12-05 ENCOUNTER — Encounter: Payer: Self-pay | Admitting: Internal Medicine

## 2014-12-05 ENCOUNTER — Ambulatory Visit: Payer: Self-pay | Attending: Internal Medicine | Admitting: Internal Medicine

## 2014-12-05 VITALS — BP 149/82 | HR 63 | Temp 98.7°F | Resp 18 | Ht 59.0 in | Wt 185.0 lb

## 2014-12-05 DIAGNOSIS — I10 Essential (primary) hypertension: Secondary | ICD-10-CM

## 2014-12-05 DIAGNOSIS — Z23 Encounter for immunization: Secondary | ICD-10-CM

## 2014-12-05 DIAGNOSIS — M545 Low back pain: Secondary | ICD-10-CM

## 2014-12-05 DIAGNOSIS — N342 Other urethritis: Secondary | ICD-10-CM

## 2014-12-05 LAB — POCT URINALYSIS DIPSTICK
Bilirubin, UA: NEGATIVE
GLUCOSE UA: NEGATIVE
Ketones, UA: NEGATIVE
NITRITE UA: NEGATIVE
Protein, UA: NEGATIVE
SPEC GRAV UA: 1.02
UROBILINOGEN UA: 0.2
pH, UA: 7

## 2014-12-05 MED ORDER — DICLOFENAC SODIUM 75 MG PO TBEC
75.0000 mg | DELAYED_RELEASE_TABLET | Freq: Two times a day (BID) | ORAL | Status: DC
Start: 2014-12-05 — End: 2015-04-15

## 2014-12-05 MED ORDER — CIPROFLOXACIN HCL 500 MG PO TABS: 500.0000 mg | ORAL_TABLET | Freq: Two times a day (BID) | ORAL | Status: DC
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
## 2014-12-05 NOTE — Patient Instructions (Signed)
Dolor de espalda en el adulto °(Back Pain, Adult) ° El dolor de cintura es frecuente. Aproximadamente 1 de cada 5 personas lo sufren. La causa rara vez pone en peligro la vida. Con frecuencia mejora luego de algún tiempo. Alrededor de la mitad de las personas que sufren un inicio súbito de dolor de cintura, se sentirán mejor luego de 2 semanas. Aproximadamente 8 de cada 10 se sentirán mejor luego de 6 semanas.  °CAUSAS  °Algunas causas comunes son:  °· Distensión de los músculos o ligamentos que sostienen la columna vertebral. °· Desgaste (degeneración) de los discos vertebrales. °· Artritis. °· Traumatismos directos en la espalda. °DIAGNÓSTICO  °La mayor parte de las veces, la causa directa no se conoce. Sin embargo, el dolor puede tratarse efectivamente aún cuando no se conozca la causa. Una de las formas más precisas de asegurar que la causa del dolor no constituye un peligro es responder a las preguntas del médico acerca de su salud y sus síntomas. Si el médico necesita más información, podrá indicar análisis de laboratorio o realizar un diagnóstico por imágenes (radiografías o resonancia magnética). Sin embargo, aunque las imágenes muestren modificaciones, generalmente no es necesaria la cirugía.  °INSTRUCCIONES PARA EL CUIDADO EN EL HOGAR  °En algunas personas, el dolor de espalda vuelve. Como rara vez es peligroso, los pacientes pueden aprender a manejarlo ellos mismos.  °· Manténgase activo. Si permanece sentado o de pie mucho tiempo en el mismo lugar, se tensiona la espalda.  No se siente, maneje ni se quede parado en un mismo lugar por más de 30 minutos. Realice caminatas cortas en superficies planas ni bien el dolor haya cedido. Trate de aumentar cada día el tiempo que camina . °· No se quede en la cama. Si hace reposo durante más de 1 o 2 días, puede retrasar la recuperación. °· No evite los ejercicios ni el trabajo. El cuerpo está hecho para moverse. No es peligroso estar activo, aunque le duela la  espalda. La espalda se curará más rápido si continúa sus actividades antes de que el dolor se vaya. °· Preste atención a su cuerpo cuando se incline y se levante. Muchas personas sienten menos molestias cuando levantan objetos si doblan las rodillas, mantienen la carga cerca del cuerpo y evitan torcerse. Generalmente, las posiciones más cómodas son las que ejercen menos tensión en la espalda en recuperación. °· Encuentre una posición cómoda para dormir. Utilice un colchón firme y recuéstese de costado. Doble ligeramente sus rodillas. Si se recuesta sobre su espalda, coloque una almohada debajo de sus rodillas. °· Tome sólo medicamentos de venta libre o recetados, según las indicaciones del médico. Los medicamentos de venta libre para calmar el dolor y reducir la inflamación, son los que en general más ayudan. El médico podrá prescribirle relajantes musculares. Estos medicamentos calman el dolor de modo que pueda retornar a sus actividades normales y a realizar ejercicios saludables. °· Aplique hielo sobre la zona lesionada. °¨ Ponga el hielo en una bolsa plástica. °¨ Colóquese una toalla entre la piel y la bolsa de hielo. °¨ Deje la bolsa de hielo durante 15 a 20 minutos 3 a 4 veces por día, durante los primeros 2 ó 3 días. Luego podrá alternar entre calor y hielo para reducir el dolor y los espasmos. °· Consulte a su médico si puede tratar de hacer ejercicios para la espalda y recibir un masaje suave. Pueden ser beneficiosos. °· Evite sentirse ansioso o estresado. El estrés aumenta la tensión muscular y puede empeorar el dolor de espalda. Es importante reconocer cuando está ansioso o estresado y aprender la forma   de controlarlos.El ejercicio es una gran opcin. SOLICITE ATENCIN MDICA SI:   Siente un dolor que no se alivia con reposo o medicamentos.  El dolor no mejora en 1 semana.  Desarrolla nuevos sntomas.  No se siente bien en general. SOLICITE ATENCIN MDICA DE INMEDIATO SI:  Siente un dolor  que se irradia desde la espalda hacia sus piernas.  Desarrolla nuevos problemas en el intestino o la vejiga.  Siente debilidad o adormecimiento inusual en sus brazos o piernas.  Presenta nuseas o vmitos.  Presenta dolor abdominal.  Se siente desfalleciente. Document Released: 12/09/2005 Document Revised: 06/09/2012 [facility] Patient Information 2015 University CenterExitCare, MarylandLLC. This information is not intended to replace advice given to you by your health care provider. Make sure you discuss any questions you have with your health care provider. Hipertensin (Hypertension) La hipertensin, conocida comnmente como presin arterial alta, se produce cuando la sangre bombea en las arterias con mucha fuerza. Las arterias son los vasos sanguneos que transportan la sangre desde el corazn hacia todas las partes del cuerpo. Una lectura de la presin arterial consiste en un nmero ms alto sobre un nmero ms bajo, por ejemplo, 110/72. El nmero ms alto (presin sistlica) corresponde a la presin interna de las arterias cuando el corazn Dorchesterbombea sangre. El nmero ms bajo (presin diastlica) corresponde a la presin interna de las arterias cuando el corazn se relaja. En condiciones ideales, la presin arterial debe ser inferior a 120/80. La hipertensin fuerza al corazn a trabajar ms para Marine scientistbombear la sangre. Las arterias pueden estrecharse o ponerse rgidas. La hipertensin conlleva el riesgo de enfermedad cardaca, ictus y otros problemas.  FACTORES DE RIESGO Algunos factores de riesgo de hipertensin son controlables, pero otros no lo son.  DynegyEntre los factores de riesgo que usted no puede Chief Operating Officercontrolar, se incluyen:   Nurse, learning disabilityLa raza. El riesgo es mayor para las Statisticianpersonas afroamericanas.  La edad. Los riesgos aumentan con la edad.  El sexo. Antes de los 45aos, los hombres corren ms Goodyear Tireriesgo que las mujeres. Despus de los 65aos, las mujeres corren ms Lexmark Internationalriesgo que los hombres. Entre los factores de riesgo que usted  puede Chief Operating Officercontrolar, se incluyen:  No hacer la cantidad suficiente de actividad fsica o ejercicio.  Tener sobrepeso.  Consumir mucha grasa, azcar, caloras o sal en la dieta.  Beber alcohol en exceso. SIGNOS Y SNTOMAS Por lo general, la hipertensin no causa signos o sntomas. La hipertensin demasiado alta (crisis hipertensiva) puede causar dolor de cabeza, ansiedad, falta de aire y hemorragia nasal. DIAGNSTICO  Para detectar si usted tiene hipertensin, el mdico le medir la presin arterial mientras est sentado, con el brazo levantado a la altura del corazn. Debe medirla al [facility] Pamenos dos veces en el mismo brazo. Determinadas condiciones pueden causar una diferencia de presin arterial entre el brazo izquierdo y Aeronautical engineerel derecho. El hecho de tener una sola lectura de la presin arterial ms alta que lo normal no significa que Research scientist (physical sciences)necesita un tratamiento. En el caso de tener una lectura de la presin arterial con un valor alto, pdale al mdico que la verifique nuevamente. TRATAMIENTO  El tratamiento de la hipertensin arterial incluye hacer cambios en el estilo de vida y, posiblemente, tomar medicamentos. Un estilo de vida saludable puede ayudar a bajar la presin arterial alta. Quiz deba cambiar algunos hbitos. Los Baker Hughes Incorporatedcambios en el estilo de vida pueden incluir:  Seguir la dieta DASH. Esta dieta tiene un alto contenido de frutas, verduras y Radiation protection practitionercereales integrales. Incluye poca cantidad de sal, carnes rojas y azcares  agregados.  Hacer al menos 2horas de actividad fsica enrgica todas las semanas.  Perder peso, si es necesario.  No fumar.  Limitar el consumo de bebidas alcohlicas.  Aprender formas de reducir el estrs. Si los cambios en el estilo de vida no son suficientes para Museum/gallery curatorlograr controlar la presin arterial, el mdico puede recetarle medicamentos. Quiz necesite tomar ms de uno. Trabaje en conjunto con su mdico para comprender los riesgos y los beneficios. INSTRUCCIONES PARA EL CUIDADO  EN EL HOGAR  Haga que le midan de nuevo la presin arterial segn las indicaciones del mdico.  Tome los medicamentos solamente como se lo haya indicado el mdico. Siga cuidadosamente las indicaciones. Los medicamentos para la presin arterial deben tomarse segn las indicaciones. Los medicamentos pierden eficacia al omitir las dosis. El hecho de omitir las dosis tambin Lesothoaumenta el riesgo de otros problemas.  No fume.  Contrlese la presin arterial en su casa segn las indicaciones del mdico. SOLICITE ATENCIN MDICA SI:   Piensa que tiene una reaccin alrgica a los medicamentos.  Tiene mareos o dolores de cabeza con Naval architectrecurrencia.  Tiene hinchazn en los tobillos.  Tiene problemas de visin. SOLICITE ATENCIN MDICA DE INMEDIATO SI:  Siente un dolor de cabeza intenso o confusin.  Siente debilidad inusual, adormecimiento o que Hospital doctorse desmayar.  Siente dolor intenso en el pecho o en el abdomen.  Vomita repetidas veces.  Tiene dificultad para respirar. ASEGRESE DE QUE:   Comprende estas instrucciones.  Controlar su afeccin.  Recibir ayuda de inmediato si no mejora o si empeora. Document Released: 12/09/2005 Document Revised: 04/25/2014 [facility]Exit[facility] Patient Information 2015 [place] LorraineExitCare, MarylandLLC. This information is not intended to replace advice given to you by your health care provider. Make sure you discuss any questions you have with your health care provider.

## 2014-12-13 ENCOUNTER — Ambulatory Visit (INDEPENDENT_AMBULATORY_CARE_PROVIDER_SITE_OTHER): Payer: Self-pay | Admitting: Physician Assistant

## 2014-12-13 VITALS — BP 134/86 | HR 66 | Temp 99.0°F | Resp 18 | Ht 59.75 in | Wt 181.8 lb

## 2014-12-13 DIAGNOSIS — R1013 Epigastric pain: Secondary | ICD-10-CM

## 2014-12-13 DIAGNOSIS — R112 Nausea with vomiting, unspecified: Secondary | ICD-10-CM

## 2014-12-13 DIAGNOSIS — K529 Noninfective gastroenteritis and colitis, unspecified: Secondary | ICD-10-CM

## 2014-12-13 LAB — COMPLETE METABOLIC PANEL WITH GFR
ALT: 71 U/L — ABNORMAL HIGH (ref 0–35)
AST: 82 U/L — ABNORMAL HIGH (ref 0–37)
Albumin: 3.8 g/dL (ref 3.5–5.2)
Alkaline Phosphatase: 85 U/L (ref 39–117)
BILIRUBIN TOTAL: 0.7 mg/dL (ref 0.2–1.2)
BUN: 15 mg/dL (ref 6–23)
CO2: 27 meq/L (ref 19–32)
CREATININE: 0.51 mg/dL (ref 0.50–1.10)
Calcium: 9.4 mg/dL (ref 8.4–10.5)
Chloride: 106 mEq/L (ref 96–112)
GLUCOSE: 91 mg/dL (ref 70–99)
Potassium: 4.3 mEq/L (ref 3.5–5.3)
Sodium: 139 mEq/L (ref 135–145)
Total Protein: 6.9 g/dL (ref 6.0–8.3)

## 2014-12-13 LAB — POCT CBC
Granulocyte percent: 78 %G (ref 37–80)
HEMATOCRIT: 39.3 % (ref 37.7–47.9)
HEMOGLOBIN: 13.2 g/dL (ref 12.2–16.2)
LYMPH, POC: 0.9 (ref 0.6–3.4)
MCH: 31.5 pg — AB (ref 27–31.2)
MCHC: 33.5 g/dL (ref 31.8–35.4)
MCV: 94.2 fL (ref 80–97)
MID (cbc): 0.5 (ref 0–0.9)
MPV: 6.5 fL (ref 0–99.8)
POC Granulocyte: 5.1 (ref 2–6.9)
POC LYMPH PERCENT: 14 %L (ref 10–50)
POC MID %: 8 % (ref 0–12)
Platelet Count, POC: 308 10*3/uL (ref 142–424)
RBC: 4.17 M/uL (ref 4.04–5.48)
RDW, POC: 13.2 %
WBC: 6.6 10*3/uL (ref 4.6–10.2)

## 2014-12-13 LAB — LIPASE: Lipase: 34 U/L (ref 0–75)

## 2014-12-13 MED ORDER — ONDANSETRON HCL 4 MG PO TABS: 4.0000 mg | ORAL_TABLET | Freq: Three times a day (TID) | ORAL | Status: AC | PRN

## 2014-12-13 MED ORDER — PANTOPRAZOLE SODIUM 40 MG PO TBEC: 40.0000 mg | DELAYED_RELEASE_TABLET | Freq: Every day | ORAL | Status: DC

## 2014-12-13 NOTE — Progress Notes (Signed)
MRN: [id number] DOB: 01/28/1951  Subjective:   [name] [name] is a 63 y.o. female presenting for epigastric pain at 11:30pm last night.  She is with her daughter in law.  The pain radiated through to her back, and up towards her head.  She states that she had difficulty breathing with the pain.  She was diaphoretic in assocation with this episode.  She then episodic bouts of nausea and vomiting to the early morning along with diarrhea.  This was yellow and non-bloody.  Last BM was 6am.  The last emesis 11:30am.  She has not had any thing to eat since last night but peanut crackers here.  She has slight dizziness and headache.  She denies epigastric pain now.  She ate tamales for dinner.  Daughter-in-law states that the family also ate tamales and no one has had these symptoms.  Daughter in law states that the patient is not taking the protonix.    [name] [name] has a current medication list which includes the following prescription(s): diclofenac, lisinopril, pantoprazole, piroxicam, promethazine, risedronate, and simvastatin.  She has No Known Allergies.  [name] [name]  has a past medical history of Arthritis; Osteoporosis; and Hypertension. Also  has no past surgical history on file.  ROS As in subjective.  Objective:   Vitals: BP 134/86 mmHg  Pulse 66  Temp(Src) 99 F (37.2 C) (Oral)  Resp 18  Ht 4' 11.75" (1.518 m)  Wt 181 lb 12.8 oz (82.464 kg)  BMI 35.79 kg/m2  SpO2 98%  Physical Exam  Constitutional: She is oriented to person, place, and time and well-developed, well-nourished, and in no distress. No distress.  Eyes: Pupils are equal, round, and reactive to light. Right eye exhibits no discharge. Left eye exhibits no discharge.  Neck: Neck supple.  Cardiovascular: Normal rate, regular rhythm and normal heart sounds.  Exam reveals no gallop.   No murmur heard. Pulmonary/Chest: Effort normal and breath sounds normal. No respiratory distress. She has no wheezes.  Abdominal: Soft.  Bowel sounds are normal. She exhibits no distension, no abdominal bruit and no pulsatile midline mass. There is tenderness (Tendereness to epigastric area.  Negative Murphy's sign.  Negative McBurney's.  No masses or bruits appreciated or heard.). There is no CVA tenderness.  Lymphadenopathy:    She has no cervical adenopathy.  Neurological: She is alert and oriented to person, place, and time.  Skin: Skin is warm and dry. No rash noted.  Psychiatric: Mood, memory, affect and judgment normal.    Results for orders placed or performed in visit on 12/13/14  POCT CBC  Result Value Ref Range   WBC 6.6 4.6 - 10.2 K/uL   Lymph, poc 0.9 0.6 - 3.4   POC LYMPH PERCENT 14.0 10 - 50 %L   MID (cbc) 0.5 0 - 0.9   POC MID % 8.0 0 - 12 %M   POC Granulocyte 5.1 2 - 6.9   Granulocyte percent 78.0 37 - 80 %G   RBC 4.17 4.04 - 5.48 M/uL   Hemoglobin 13.2 12.2 - 16.2 g/dL   HCT, POC 52.839.3 41.337.7 - 47.9 %   MCV 94.2 80 - 97 fL   MCH, POC 31.5 (A) 27 - 31.2 pg   MCHC 33.5 31.8 - 35.4 g/dL   RDW, POC 24.413.2 %   Platelet Count, POC 308 142 - 424 K/uL   MPV 6.5 0 - 99.8 fL   Orthostats as listed in extended vitals.    EKG reviewed with Dr. Alwyn RenHopper: No  changes from last documented EKG.    Assessment and Plan :  63 year old is here today for nausea, vomiting, and diarrhea.   Diff dx includes gastroenteritis, appendicitis, gastritis.  Given the symptoms, I am most suspicious of gastroenteritis.  Refilling her protonix and advised to continue taking.  Ondansetron given for prevention.  Gastroenteritis   Abdominal pain, epigastric - Plan: POCT CBC, COMPLETE METABOLIC PANEL WITH GFR, Lipase, EKG 12-Lead, pantoprazole (PROTONIX) 40 MG tablet  Nausea and vomiting, vomiting of unspecified type - Plan: ondansetron (ZOFRAN) 4 MG tablet    Trena PlattStephanie Maryann Mccall, PA-C [facility] 12/22/20158:32 PM

## 2014-12-13 NOTE — Patient Instructions (Addendum)
Please drink plenty of water I will contact you about the results of your labs.  Gastroenteritis viral (Viral Gastroenteritis) La gastroenteritis viral tambin es conocida como gripe del Grandviewestmago. Este trastorno Performance Food Groupafecta el estmago y el tubo digestivo. Puede causar diarrea y vmitos repentinos. La enfermedad generalmente dura entre 3 y [address] das. La Harley-Davidsonmayora de las personas desarrolla una respuesta inmunolgica. Con el tiempo, esto elimina el virus. Mientras se desarrolla esta respuesta natural, el virus puede afectar en forma importante su salud.  CAUSAS Muchos virus diferentes pueden causar gastroenteritis, por ejemplo el rotavirus o el norovirus. Estos virus pueden contagiarse al consumir alimentos o agua contaminados. Tambin puede contagiarse al compartir utensilios u otros artculos personales con una persona infectada o al tocar una superficie contaminada.  SNTOMAS Los sntomas ms comunes son diarrea y vmitos. Estos problemas pueden causar una prdida grave de lquidos corporales(deshidratacin) y un desequilibrio de sales corporales(electrolitos). Otros sntomas pueden ser:   Grant RutsFiebre.  Dolor de Turkmenistancabeza.  Fatiga.  Dolor abdominal. DIAGNSTICO  El mdico podr hacer el diagnstico de gastroenteritis viral basndose en los sntomas y el examen fsico Tambin pueden tomarle una muestra de materia fecal para diagnosticar la presencia de virus u otras infecciones.  TRATAMIENTO Esta enfermedad generalmente desaparece sin tratamiento. Los tratamientos estn dirigidos a Social research officer, governmentla rehidratacin. Los casos ms graves de gastroenteritis viral implican vmitos tan intensos que no es posible retener lquidos. En Franklin Resourcesestos casos, los lquidos deben administrarse a travs de una va intravenosa (IV).  INSTRUCCIONES PARA EL CUIDADO DOMICILIARIO  Beba suficientes lquidos para mantener la orina clara o de color amarillo plido. Beba pequeas cantidades de lquido con frecuencia y aumente la cantidad segn la  tolerancia.  Pida instrucciones especficas a su mdico con respecto a la rehidratacin.  Evite:  Alimentos que Nurse, adulttengan mucha azcar.  Alcohol.  Gaseosas.  TabacoVista Lawman.  Jugos.  Bebidas con cafena.  Lquidos muy calientes o fros.  Alimentos muy grasos.  Comer demasiado a Licensed conveyancerla vez.  Productos lcteos hasta 24 a 48 horas despus de que se detenga la diarrea.  Puede consumir probiticos. Los probiticos son cultivos activos de bacterias beneficiosas. Pueden disminuir la cantidad y el nmero de deposiciones diarreicas en el adulto. Se encuentran en los yogures con cultivos activos y en los suplementos.  Lave bien sus manos para evitar que se disemine el virus.  Slo tome medicamentos de venta libre o recetados para Primary school teachercalmar el dolor, las molestias o bajar la fiebre segn las indicaciones de su mdico. No administre aspirina a los nios. Los medicamentos antidiarreicos no son recomendables.  Consulte a su mdico si puede seguir tomando sus medicamentos recetados o de H. J. Heinzventa libre.  Cumpla con todas las visitas de control, segn le indique su mdico. SOLICITE ATENCIN MDICA DE INMEDIATO SI:  No puede retener lquidos.  No hay emisin de orina durante 6 a 8 horas.  Le falta el aire.  Observa sangre en el vmito (se ve como caf molido) o en la materia fecal.  Siente dolor abdominal que empeora o se concentra en una zona pequea (se localiza).  Tiene nuseas o vmitos persistentes.  Tiene fiebre.  El paciente es un nio menor de 3 meses y Mauritaniatiene fiebre.  El paciente es un nio mayor de 3 meses, tiene fiebre y sntomas persistentes.  El paciente es un nio mayor de 3 meses y tiene fiebre y sntomas que empeoran repentinamente.  El paciente es un beb y no tiene lgrimas cuando llora. ASEGRESE QUE:   Comprende estas instrucciones.  Controlar su enfermedad.  Solicitar ayuda inmediatamente si no mejora o si empeora. Document Released: 12/09/2005 Document Revised:  03/02/2012 [facility]Exit[facility] Patient Information 2015 [place]ExitCare, [place]LLC. This information is not intended to replace advice given to you by your health care provider. Make sure you discuss any questions you have with your health care provider.

## 2014-12-27 ENCOUNTER — Ambulatory Visit: Payer: Self-pay | Attending: Internal Medicine | Admitting: Internal Medicine

## 2014-12-27 ENCOUNTER — Encounter: Payer: Self-pay | Admitting: Internal Medicine

## 2014-12-27 VITALS — BP 130/79 | HR 65 | Temp 98.1°F | Resp 16 | Ht 60.0 in | Wt 181.0 lb

## 2014-12-27 DIAGNOSIS — R059 Cough, unspecified: Secondary | ICD-10-CM

## 2014-12-27 DIAGNOSIS — I10 Essential (primary) hypertension: Secondary | ICD-10-CM | POA: Insufficient documentation

## 2014-12-27 DIAGNOSIS — R05 Cough: Secondary | ICD-10-CM | POA: Insufficient documentation

## 2014-12-27 DIAGNOSIS — M810 Age-related osteoporosis without current pathological fracture: Secondary | ICD-10-CM | POA: Insufficient documentation

## 2014-12-27 DIAGNOSIS — H11003 Unspecified pterygium of eye, bilateral: Secondary | ICD-10-CM | POA: Insufficient documentation

## 2014-12-27 DIAGNOSIS — Z1239 Encounter for other screening for malignant neoplasm of breast: Secondary | ICD-10-CM

## 2014-12-27 DIAGNOSIS — Z791 Long term (current) use of non-steroidal anti-inflammatories (NSAID): Secondary | ICD-10-CM | POA: Insufficient documentation

## 2014-12-27 MED ORDER — DM-GUAIFENESIN ER 30-600 MG PO TB12: 1.0000 | ORAL_TABLET | Freq: Two times a day (BID) | ORAL | Status: DC

## 2014-12-27 NOTE — Progress Notes (Signed)
Patient is complaining of productive cough since XMAS States she had flu and [place] urgent care did lab work and told her her liver enzymes were abnormal-wants to follow up with you about this Patient needs mammogram Patient states she had a pap smear a little over a year ago. Had flu/PNA shot in 12/15

## 2014-12-27 NOTE — Progress Notes (Signed)
Patient ID: [name] [name], female   DOB: 09/05/1951, 64 y.o.   MRN: [id number]   [name] [name], is a 64 y.o. female  [id number]  [id number]  DOB - 08/28/1951  Chief Complaint  Patient presents with  . Hyperlipidemia  . Cough  . Hypertension        Subjective:   [name] [name] is a 64 y.o. female here today for a follow up visit. Patient has history of osteoporosis and hypertension was recently seen in the urgent care for abdominal pain, evaluation revealed mildly elevated transaminases, she was discharged to be followed up in the clinic. Patient has no significant complaints today, she said her abdominal pain has improved significantly. She has had a nonproductive cough since Christmas which started with a flulike symptom, she went to urgent care and was treated, but laboratory evaluation revealed elevated liver enzymes and was instructed to follow-up with primary care physician for further workup. Patient is due for mammogram. Last Pap smear was over one year ago. Patient has No headache, No chest pain, No abdominal pain - No Nausea, No new weakness tingling or numbness, No Cough - SOB.  Problem  Breast Cancer Screening    ALLERGIES: No Known Allergies  PAST MEDICAL HISTORY: Past Medical History  Diagnosis Date  . Arthritis   . Osteoporosis   . Hypertension     MEDICATIONS AT HOME: Prior to Admission medications   Medication Sig Start Date End Date Taking? Authorizing Provider  diclofenac (VOLTAREN) 75 MG EC tablet Take 1 tablet (75 mg total) by mouth 2 (two) times daily. 12/05/14  Yes Quentin Angstlugbemiga E Arnell Slivinski, MD  lisinopril (PRINIVIL,ZESTRIL) 20 MG tablet Take 1 tablet (20 mg total) by mouth daily. 07/28/14  Yes Quentin Angstlugbemiga E Mohamedamin Nifong, MD  simvastatin (ZOCOR) 20 MG tablet Take 1 tablet (20 mg total) by mouth at bedtime. 07/28/14  Yes Quentin Angstlugbemiga E Chevi Lim, MD  dextromethorphan-guaiFENesin (MUCINEX DM) 30-600 MG per 12 hr tablet Take 1 tablet by mouth 2 (two)  times daily. 12/27/14   Quentin Angstlugbemiga E Malachi Kinzler, MD  pantoprazole (PROTONIX) 40 MG tablet Take 1 tablet (40 mg total) by mouth daily. Patient not taking: Reported on 12/27/2014 12/13/14   Collie SiadStephanie D English, PA  piroxicam (FELDENE) 20 MG capsule Take 1 capsule (20 mg total) by mouth daily. As needed for leg pains. Spanish label please Patient not taking: Reported on 12/27/2014 07/12/13   Pearline CablesJessica C Copland, MD  promethazine (PHENERGAN) 25 MG tablet Take 0.5 tablets (12.5 mg total) by mouth every 8 (eight) hours as needed for nausea or vomiting. Patient not taking: Reported on 12/27/2014 02/10/14   Richarda OverlieNayana Abrol, MD  risedronate (ACTONEL) 35 MG tablet Take 35 mg by mouth every 7 (seven) days. with water on empty stomach, nothing by mouth or lie down for next 30 minutes.    Historical Provider, MD     Objective:   Filed Vitals:   12/27/14 1029  BP: 130/79  Pulse: 65  Temp: 98.1 F (36.7 C)  Resp: 16  Height: 5' (1.524 m)  Weight: 181 lb (82.101 kg)  SpO2: 98%    Exam General appearance : Awake, alert, not in any distress. Speech Clear. Not toxic looking, obese HEENT: Bilateral pterygia, Atraumatic and Normocephalic, pupils equally reactive to light and accomodation Neck: supple, no JVD. No cervical lymphadenopathy.  Chest:Good air entry bilaterally, no added sounds  CVS: S1 S2 regular, no murmurs.  Abdomen: Bowel sounds present, Non tender and not distended with no gaurding, rigidity or rebound. Extremities: B/L Lower  Ext shows no edema, both legs are warm to touch Neurology: Awake alert, and oriented X 3, CN II-XII intact, Non focal Skin:No Rash Wounds:N/A  Data Review Lab Results  Component Value Date   HGBA1C 5.6 11/08/2013     Assessment & Plan   1. Essential hypertension  - Continue lisinopril 20 mg tablet by mouth daily - DASH diet  2. Pterygium, bilateral  - Ambulatory referral to Ophthalmology  3. Cough  - dextromethorphan-guaiFENesin (MUCINEX DM) 30-600 MG per 12 hr  tablet; Take 1 tablet by mouth 2 (two) times daily.  Dispense: 30 tablet; Refill: 0 - Symptomatic treatment  4. Breast cancer screening  - MM Digital Screening; Future   Patient was counseled extensively about nutrition and exercise  Interpreter was used to communicate directly with patient for the entire encounter including providing detailed patient instructions.    Return for Liver function test, Follow up HTN. in 4 weeks  The patient was given clear instructions to go to ER or return to medical center if symptoms don't improve, worsen or new problems develop. The patient verbalized understanding. The patient was told to call to get lab results if they haven't heard anything in the next week.   This note has been created with Education officer, environmental. Any transcriptional errors are unintentional.    Jeanann Lewandowsky, MD, MHA, FACP, FAAP [facility] [place], [place] [phone]   12/27/2014, 11:22 AM

## 2014-12-27 NOTE — Patient Instructions (Signed)
Plan de alimentacin DASH (DASH Eating Plan) DASH es la sigla en ingls de "Enfoques Alimentarios para Detener la Hipertensin". El plan de alimentacin DASH ha demostrado bajar la presin arterial elevada (hipertensin). Los beneficios adicionales para la salud pueden incluir la disminucin del riesgo de diabetes mellitus tipo2, enfermedades cardacas e ictus. Este plan tambin puede ayudar a adelgazar. QU DEBO SABER ACERCA DEL PLAN DE ALIMENTACIN DASH? Para el plan de alimentacin DASH, seguir las siguientes pautas generales:  Elija los alimentos con un valor porcentual diario de sodio de menos del 5% (segn figura en la etiqueta del alimento).  Use hierbas o aderezos sin sal, en lugar de sal de mesa o sal marina.  Consulte al mdico o farmacutico antes de usar sustitutos de la sal.  Coma productos con bajo contenido de sodio, cuya etiqueta suele decir "bajo contenido de sodio" o "sin agregado de sal".  Coma alimentos frescos.  Coma ms verduras, frutas y productos lcteos con bajo contenido de grasas.  Elija los cereales integrales. Busque la palabra "integral" en el primer lugar de la lista de ingredientes.  Elija el pescado y el pollo o el pavo sin piel ms a menudo que las carnes rojas. Limite el consumo de pescado, carne de ave y carne a 6onzas (170g) por da.  Limite el consumo de dulces, postres, azcares y bebidas azucaradas.  Elija las grasas saludables para el corazn.  Limite el consumo de queso a 1onza (28g) por da.  Consuma ms comida casera y menos de restaurante, de buf y comida rpida.  Limite el consumo de alimentos fritos.  Cocine los alimentos utilizando mtodos que no sean la fritura.  Limite las verduras enlatadas. Si las consume, enjuguelas bien para disminuir el sodio.  Cuando coma en un restaurante, pida que preparen su comida con menos sal o, en lo posible, sin nada de sal. QU ALIMENTOS PUEDO COMER? Pida ayuda a un nutricionista para  conocer las necesidades calricas individuales. Cereales Pan de salvado o integral. Arroz integral. Pastas de salvado o integrales. Quinua, trigo burgol y cereales integrales. Cereales con bajo contenido de sodio. Tortillas de harina de maz o de salvado. Pan de maz integral. Galletas saladas integrales. Galletas con bajo contenido de sodio. Vegetales Verduras frescas o congeladas (crudas, al vapor, asadas o grilladas). Jugos de tomate y verduras con contenido bajo o reducido de sodio. Pasta y salsa de tomate con contenido bajo o reducido de sodio. Verduras enlatadas con bajo contenido de sodio o reducido de sodio.  Frutas Frutas frescas, en conserva (en su jugo natural) o frutas congeladas. Carnes y otros productos con protenas Carne de res molida (al 85% o ms magra), carne de res de animales alimentados con pastos o carne de res sin la grasa. Pollo o pavo sin piel. Carne de pollo o de pavo molida. Cerdo sin la grasa. Todos los pescados y frutos de mar. Huevos. Porotos, guisantes o lentejas secos. Frutos secos y semillas sin sal. Frijoles enlatados sin sal. Lcteos Productos lcteos con bajo contenido de grasas, como leche descremada o al 1%, quesos reducidos en grasas o al 2%, ricota con bajo contenido de grasas o queso cottage, o yogur natural con bajo contenido de grasas. Quesos con contenido bajo o reducido de sodio. Grasas y aceites Margarinas en barra que no contengan grasas trans. Mayonesa y alios para ensaladas livianos o reducidos en grasas (reducidos en sodio). [place]. Aceites de crtamo, oliva o canola. Mantequilla natural de man o almendra. Otros Palomitas de maz y pretzels sin sal.   Los artculos mencionados arriba pueden no ser una lista completa de las bebidas o los alimentos recomendados. Comunquese con el nutricionista para conocer ms opciones. QU ALIMENTOS NO SE RECOMIENDAN? Cereales Pan blanco. Pastas blancas. Arroz blanco. Pan de maz refinado. Bagels y  croissants. Galletas saladas que contengan grasas trans. Vegetales Vegetales con crema o fritos. Verduras en salsa de queso. Verduras enlatadas comunes. Pasta y salsa de tomate en lata comunes. Jugos comunes de tomate y de verduras. Frutas Frutas secas. Fruta enlatada en almbar liviano o espeso. Jugo de frutas. Carnes y otros productos con protenas Cortes de carne con grasa. Costillas, alas de pollo, tocineta, salchicha, mortadela, salame, chinchulines, tocino, perros calientes, salchichas alemanas y embutidos envasados. Frutos secos y semillas con sal. Frijoles con sal en lata. Lcteos Leche entera o al 2%, crema, mezcla de leche y crema, y queso crema. Yogur entero o endulzado. Quesos o queso azul con alto contenido de grasas. Cremas no lcteas y coberturas batidas. Quesos procesados, quesos para untar o cuajadas. Condimentos Sal de cebolla y ajo, sal condimentada, sal de mesa y sal marina. Salsas en lata y envasadas. Salsa Worcestershire. Salsa trtara. Salsa barbacoa. Salsa teriyaki. Salsa de soja, incluso la que tiene contenido reducido de sodio. Salsa de carne. Salsa de pescado. Salsa de ostras. Salsa rosada. Rbano picante. Ketchup y mostaza. Saborizantes y tiernizantes para carne. Caldo en cubitos. Salsa picante. Salsa tabasco. Adobos. Aderezos para tacos. Salsas. Grasas y aceites Mantequilla, margarina en barra, manteca de cerdo, grasa, mantequilla clarificada y grasa de tocino. Aceites de coco, de palmiste o de palma. Aderezos comunes para ensalada. Otros Pickles y aceitunas. Palomitas de maz y pretzels con sal. Los artculos mencionados arriba pueden no ser una lista completa de las bebidas y los alimentos que se deben evitar. Comunquese con el nutricionista para obtener ms informacin. DNDE PUEDO ENCONTRAR MS INFORMACIN? Instituto Nacional del Corazn, del Pulmn y de la Sangre (National Heart, Lung, and Blood Institute):  www.nhlbi.nih.gov/health/health-topics/topics/dash/ Document Released: 11/28/2011 Document Revised: 04/25/2014 ExitCare Patient Information 2015 Exit[employer], [employer]. This information is not intended to replace advice given to you by your health care provider. Make sure you discuss any questions you have with your health care provider. Hipertensin (Hypertension) La hipertensin, conocida comnmente como presin arterial alta, se produce cuando la sangre bombea en las arterias con mucha fuerza. Las arterias son los vasos sanguneos que transportan la sangre desde el corazn hacia todas las partes del cuerpo. Una lectura de la presin arterial consiste en un nmero ms alto sobre un nmero ms bajo, por ejemplo, 110/72. El nmero ms alto (presin sistlica) corresponde a la presin interna de las arterias cuando el corazn bombea sangre. El nmero ms bajo (presin diastlica) corresponde a la presin interna de las arterias cuando el corazn se relaja. En condiciones ideales, la presin arterial debe ser inferior a 120/80. La hipertensin fuerza al corazn a trabajar ms para bombear la sangre. Las arterias pueden estrecharse o ponerse rgidas. La hipertensin conlleva el riesgo de enfermedad cardaca, ictus y otros problemas.  FACTORES DE RIESGO Algunos factores de riesgo de hipertensin son controlables, pero otros no lo son.  Entre los factores de riesgo que usted no puede controlar, se incluyen:   La raza. El riesgo es mayor para las personas afroamericanas.  La edad. Los riesgos aumentan con la edad.  El sexo. Antes de los 45aos, los hombres corren ms riesgo que las mujeres. Despus de los 65aos, las mujeres corren ms riesgo que los hombres. Entre los factores de riesgo   que usted puede controlar, se incluyen:  No hacer la cantidad suficiente de actividad fsica o ejercicio.  Tener sobrepeso.  Consumir mucha grasa, azcar, caloras o sal en la dieta.  Beber alcohol en exceso. SIGNOS Y  SNTOMAS Por lo general, la hipertensin no causa signos o sntomas. La hipertensin demasiado alta (crisis hipertensiva) puede causar dolor de cabeza, ansiedad, falta de aire y hemorragia nasal. DIAGNSTICO  Para detectar si usted tiene hipertensin, el mdico le medir la presin arterial mientras est sentado, con el brazo levantado a la altura del corazn. Debe medirla al menos dos veces en el mismo brazo. Determinadas condiciones pueden causar una diferencia de presin arterial entre el brazo izquierdo y el derecho. El hecho de tener una sola lectura de la presin arterial ms alta que lo normal no significa que necesita un tratamiento. En el caso de tener una lectura de la presin arterial con un valor alto, pdale al mdico que la verifique nuevamente. TRATAMIENTO  El tratamiento de la hipertensin arterial incluye hacer cambios en el estilo de vida y, posiblemente, tomar medicamentos. Un estilo de vida saludable puede ayudar a bajar la presin arterial alta. Quiz deba cambiar algunos hbitos. Los cambios en el estilo de vida pueden incluir:  Seguir la dieta DASH. Esta dieta tiene un alto contenido de frutas, verduras y cereales integrales. Incluye poca cantidad de sal, carnes rojas y azcares agregados.  Hacer al menos 2horas de actividad fsica enrgica todas las semanas.  Perder peso, si es necesario.  No fumar.  Limitar el consumo de bebidas alcohlicas.  Aprender formas de reducir el estrs. Si los cambios en el estilo de vida no son suficientes para lograr controlar la presin arterial, el mdico puede recetarle medicamentos. Quiz necesite tomar ms de uno. Trabaje en conjunto con su mdico para comprender los riesgos y los beneficios. INSTRUCCIONES PARA EL CUIDADO EN EL HOGAR  Haga que le midan de nuevo la presin arterial segn las indicaciones del mdico.  Tome los medicamentos solamente como se lo haya indicado el mdico. Siga cuidadosamente las indicaciones. Los  medicamentos para la presin arterial deben tomarse segn las indicaciones. Los medicamentos pierden eficacia al omitir las dosis. El hecho de omitir las dosis tambin aumenta el riesgo de otros problemas.  No fume.  Contrlese la presin arterial en su casa segn las indicaciones del mdico. SOLICITE ATENCIN MDICA SI:   Piensa que tiene una reaccin alrgica a los medicamentos.  Tiene mareos o dolores de cabeza con recurrencia.  Tiene hinchazn en los tobillos.  Tiene problemas de visin. SOLICITE ATENCIN MDICA DE INMEDIATO SI:  Siente un dolor de cabeza intenso o confusin.  Siente debilidad inusual, adormecimiento o que se desmayar.  Siente dolor intenso en el pecho o en el abdomen.  Vomita repetidas veces.  Tiene dificultad para respirar. ASEGRESE DE QUE:   Comprende estas instrucciones.  Controlar su afeccin.  Recibir ayuda de inmediato si no mejora o si empeora. Document Released: 12/09/2005 Document Revised: 04/25/2014 ExitCare Patient Information 2015 ExitCare, LLC. This information is not intended to replace advice given to you by your health care provider. Make sure you discuss any questions you have with your health care provider.  

## 2015-01-09 ENCOUNTER — Ambulatory Visit: Payer: Self-pay

## 2015-01-26 ENCOUNTER — Ambulatory Visit: Payer: No Typology Code available for payment source | Attending: Internal Medicine | Admitting: Internal Medicine

## 2015-01-26 ENCOUNTER — Encounter: Payer: Self-pay | Admitting: Internal Medicine

## 2015-01-26 VITALS — BP 144/77 | HR 63 | Temp 98.5°F | Resp 16 | Ht 61.0 in | Wt 182.0 lb

## 2015-01-26 DIAGNOSIS — R7401 Elevation of levels of liver transaminase levels: Secondary | ICD-10-CM

## 2015-01-26 DIAGNOSIS — R740 Nonspecific elevation of levels of transaminase and lactic acid dehydrogenase [LDH]: Secondary | ICD-10-CM | POA: Insufficient documentation

## 2015-01-26 DIAGNOSIS — R778 Other specified abnormalities of plasma proteins: Secondary | ICD-10-CM | POA: Insufficient documentation

## 2015-01-26 DIAGNOSIS — I10 Essential (primary) hypertension: Secondary | ICD-10-CM | POA: Insufficient documentation

## 2015-01-26 DIAGNOSIS — F411 Generalized anxiety disorder: Secondary | ICD-10-CM | POA: Insufficient documentation

## 2015-01-26 LAB — HEPATIC FUNCTION PANEL
ALK PHOS: 84 U/L (ref 39–117)
ALT: 23 U/L (ref 0–35)
AST: 26 U/L (ref 0–37)
Albumin: 4.3 g/dL (ref 3.5–5.2)
BILIRUBIN DIRECT: 0.1 mg/dL (ref 0.0–0.3)
BILIRUBIN TOTAL: 0.8 mg/dL (ref 0.2–1.2)
Indirect Bilirubin: 0.7 mg/dL (ref 0.2–1.2)
TOTAL PROTEIN: 7.6 g/dL (ref 6.0–8.3)

## 2015-01-26 MED ORDER — HYDROXYZINE HCL 25 MG PO TABS
25.0000 mg | ORAL_TABLET | Freq: Every day | ORAL | Status: DC
Start: 2015-01-26 — End: 2015-04-15

## 2015-01-26 NOTE — Patient Instructions (Signed)
DASH Eating Plan DASH stands for "Dietary Approaches to Stop Hypertension." The DASH eating plan is a healthy eating plan that has been shown to reduce high blood pressure (hypertension). Additional health benefits may include reducing the risk of type 2 diabetes mellitus, heart disease, and stroke. The DASH eating plan may also help with weight loss. WHAT DO I NEED TO KNOW ABOUT THE DASH EATING PLAN? For the DASH eating plan, you will follow these general guidelines:  Choose foods with a percent daily value for sodium of less than 5% (as listed on the food label).  Use salt-free seasonings or herbs instead of table salt or sea salt.  Check with your health care provider or pharmacist before using salt substitutes.  Eat lower-sodium products, often labeled as "lower sodium" or "no salt added."  Eat fresh foods.  Eat more vegetables, fruits, and low-fat dairy products.  Choose whole grains. Look for the word "whole" as the first word in the ingredient list.  Choose fish and skinless chicken or turkey more often than red meat. Limit fish, poultry, and meat to 6 oz (170 g) each day.  Limit sweets, desserts, sugars, and sugary drinks.  Choose heart-healthy fats.  Limit cheese to 1 oz (28 g) per day.  Eat more home-cooked food and less restaurant, buffet, and fast food.  Limit fried foods.  Cook foods using methods other than frying.  Limit canned vegetables. If you do use them, rinse them well to decrease the sodium.  When eating at a restaurant, ask that your food be prepared with less salt, or no salt if possible. WHAT FOODS CAN I EAT? Seek help from a dietitian for individual calorie needs. Grains Whole grain or whole wheat bread. Brown rice. Whole grain or whole wheat pasta. Quinoa, bulgur, and whole grain cereals. Low-sodium cereals. Corn or whole wheat flour tortillas. Whole grain cornbread. Whole grain crackers. Low-sodium crackers. Vegetables Fresh or frozen vegetables  (raw, steamed, roasted, or grilled). Low-sodium or reduced-sodium tomato and vegetable juices. Low-sodium or reduced-sodium tomato sauce and paste. Low-sodium or reduced-sodium canned vegetables.  Fruits All fresh, canned (in natural juice), or frozen fruits. Meat and Other Protein Products Ground beef (85% or leaner), grass-fed beef, or beef trimmed of fat. Skinless chicken or turkey. Ground chicken or turkey. Pork trimmed of fat. All fish and seafood. Eggs. Dried beans, peas, or lentils. Unsalted nuts and seeds. Unsalted canned beans. Dairy Low-fat dairy products, such as skim or 1% milk, 2% or reduced-fat cheeses, low-fat ricotta or cottage cheese, or plain low-fat yogurt. Low-sodium or reduced-sodium cheeses. Fats and Oils Tub margarines without trans fats. Light or reduced-fat mayonnaise and salad dressings (reduced sodium). Avocado. Safflower, olive, or canola oils. Natural peanut or almond butter. Other Unsalted popcorn and pretzels. The items listed above may not be a complete list of recommended foods or beverages. Contact your dietitian for more options. WHAT FOODS ARE NOT RECOMMENDED? Grains White bread. White pasta. White rice. Refined cornbread. Bagels and croissants. Crackers that contain trans fat. Vegetables Creamed or fried vegetables. Vegetables in a cheese sauce. Regular canned vegetables. Regular canned tomato sauce and paste. Regular tomato and vegetable juices. Fruits Dried fruits. Canned fruit in light or heavy syrup. Fruit juice. Meat and Other Protein Products Fatty cuts of meat. Ribs, chicken wings, bacon, sausage, bologna, salami, chitterlings, fatback, hot dogs, bratwurst, and packaged luncheon meats. Salted nuts and seeds. Canned beans with salt. Dairy Whole or 2% milk, cream, half-and-half, and cream cheese. Whole-fat or sweetened yogurt. Full-fat   cheeses or blue cheese. Nondairy creamers and whipped toppings. Processed cheese, cheese spreads, or cheese  curds. Condiments Onion and garlic salt, seasoned salt, table salt, and sea salt. Canned and packaged gravies. Worcestershire sauce. Tartar sauce. Barbecue sauce. Teriyaki sauce. Soy sauce, including reduced sodium. Steak sauce. Fish sauce. Oyster sauce. Cocktail sauce. Horseradish. Ketchup and mustard. Meat flavorings and tenderizers. Bouillon cubes. Hot sauce. Tabasco sauce. Marinades. Taco seasonings. Relishes. Fats and Oils Butter, stick margarine, lard, shortening, ghee, and bacon fat. Coconut, palm kernel, or palm oils. Regular salad dressings. Other Pickles and olives. Salted popcorn and pretzels. The items listed above may not be a complete list of foods and beverages to avoid. Contact your dietitian for more information. WHERE CAN I FIND MORE INFORMATION? National Heart, Lung, and Blood Institute: travelstabloid.com Document Released: 11/28/2011 Document Revised: 04/25/2014 Document Reviewed: 10/13/2013 [facility] Patient Information 2015 [place], [place]. This information is not intended to replace advice given to you by your health care provider. Make sure you discuss any questions you have with your health care provider. Hypertension Hypertension, commonly called high blood pressure, is when the force of blood pumping through your arteries is too strong. Your arteries are the blood vessels that carry blood from your heart throughout your body. A blood pressure reading consists of a higher number over a lower number, such as 110/72. The higher number (systolic) is the pressure inside your arteries when your heart pumps. The lower number (diastolic) is the pressure inside your arteries when your heart relaxes. Ideally you want your blood pressure below 120/80. Hypertension forces your heart to work harder to pump blood. Your arteries may become narrow or stiff. Having hypertension puts you at risk for heart disease, stroke, and other problems.  RISK  FACTORS Some risk factors for high blood pressure are controllable. Others are not.  Risk factors you cannot control include:   Race. You may be at higher risk if you are African American.  Age. Risk increases with age.  Gender. Men are at higher risk than women before age 37 years. After age 55, women are at higher risk than men. Risk factors you can control include:  Not getting enough exercise or physical activity.  Being overweight.  Getting too much fat, sugar, calories, or salt in your diet.  Drinking too much alcohol. SIGNS AND SYMPTOMS Hypertension does not usually cause signs or symptoms. Extremely high blood pressure (hypertensive crisis) may cause headache, anxiety, shortness of breath, and nosebleed. DIAGNOSIS  To check if you have hypertension, your health care provider will measure your blood pressure while you are seated, with your arm held at the level of your heart. It should be measured at least twice using the same arm. Certain conditions can cause a difference in blood pressure between your right and left arms. A blood pressure reading that is higher than normal on one occasion does not mean that you need treatment. If one blood pressure reading is high, ask your health care provider about having it checked again. TREATMENT  Treating high blood pressure includes making lifestyle changes and possibly taking medicine. Living a healthy lifestyle can help lower high blood pressure. You may need to change some of your habits. Lifestyle changes may include:  Following the DASH diet. This diet is high in fruits, vegetables, and whole grains. It is low in salt, red meat, and added sugars.  Getting at least 2 hours of brisk physical activity every week.  Losing weight if necessary.  Not smoking.  Limiting  alcoholic beverages.  Learning ways to reduce stress. If lifestyle changes are not enough to get your blood pressure under control, your health care provider may  prescribe medicine. You may need to take more than one. Work closely with your health care provider to understand the risks and benefits. HOME CARE INSTRUCTIONS  Have your blood pressure rechecked as directed by your health care provider.   Take medicines only as directed by your health care provider. Follow the directions carefully. Blood pressure medicines must be taken as prescribed. The medicine does not work as well when you skip doses. Skipping doses also puts you at risk for problems.   Do not smoke.   Monitor your blood pressure at home as directed by your health care provider. SEEK MEDICAL CARE IF:   You think you are having a reaction to medicines taken.  You have recurrent headaches or feel dizzy.  You have swelling in your ankles.  You have trouble with your vision. SEEK IMMEDIATE MEDICAL CARE IF:  You develop a severe headache or confusion.  You have unusual weakness, numbness, or feel faint.  You have severe chest or abdominal pain.  You vomit repeatedly.  You have trouble breathing. MAKE SURE YOU:   Understand these instructions.  Will watch your condition.  Will get help right away if you are not doing well or get worse. Document Released: 12/09/2005 Document Revised: 04/25/2014 Document Reviewed: 10/01/2013 [facility] Patient Information 2015 [employer], [place]. This information is not intended to replace advice given to you by your health care provider. Make sure you discuss any questions you have with your health care provider. 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 Interfiere en las funciones vitales, incluyendo las [place]relaciones, el trabajo y la escuela.  Es diferente de la ansiedad normal que todas las personas experimentan en algn momento de su vida en respuesta a sucesos y Chief Operating Officeractividades especficas. En verdad, la ansiedad normal nos ayuda a prepararnos y Chief Financial Officeratravesar  estos acontecimientos y actividades de la vida. La ansiedad normal desaparece despus de que el evento o la actividad ha finalizado.  El trastorno de ansiedad generalizada no est necesariamente relacionada con eventos o actividades especficas. Tambin causa un exceso de ansiedad en proporcin a sucesos o actividades especficas. En este trastorno la ansiedad es difcil de Chief Operating Officercontrolar. Los sntomas pueden variar de leves a muy graves. Las personas que sufren de trastorno de ansiedad generalizada pueden tener intensas olas de ansiedad con sntomas fsicos (ataques de pnico).  SNTOMAS  La ansiedad y la preocupacin asociada a este trastorno son difciles de Chief Operating Officercontrolar. Esta ansiedad y la preocupacin estn relacionados con muchos eventos de la vida y sus actividades y tambin ocurre durante ms [employer] Lif[employer]as de los que no ocurre, durante 6 meses o ms. Las personas que la sufren pueden tener tres o ms de los siguientes sntomas (uno o ms en los nios):   Glass blower/designerAgitacin   Fatiga.  Dificultades de concentracin.   Irritabilidad.  Tensin muscular  Dificultad para dormirse o sueo poco satisfactorio. DIAGNSTICO  Se diagnostica a travs de una evaluacin realizada por el mdico. El mdico le har preguntas acerca de su estado de nimo, sntomas fsicos y sucesos de Oregonsu vida. Le har preguntas sobre su historia clnica, el consumo de alcohol o drogas, incluyendo los medicamentos recetados. Nucor Corporationambin le har un examen fsico e indicar anlisis de [place]. Ciertas enfermedades y el uso de determinadas sustancias pueden causar sntomas similares a este trastorno. Su  mdico lo puede derivar a Music therapist en salud mental para una evaluacin ms profunda.Gerlean Ren  Las terapias siguientes se utilizan en el tratamiento de este trastorno:   Medicamentos - Se recetan antidepresivos para el control diario a Air cabin crew. Pueden indicarse tambin medicamentos para combatir la [employer] graves,  especialmente cuando ocurren ataques de pnico.   Terapia conversada (psicoterapia) Ciertos tipos de psicoterapia pueden ser tiles en el tratamiento del trastorno de ansiedad generalizada, proporcionando apoyo, educacin y Optometrist. Una forma de psicoterapia llamada terapia cognitivo-conductual puede ensearle formas saludables de pensar y Publishing rights manager a los eventos y actividades de la vida diaria.  Tcnicasde manejo del estrs- Estas tcnicas incluyen el yoga, la meditacin y el ejercicio y pueden ser muy tiles cuando se practican con regularidad. Un especialista en salud mental puede ayudar a determinar qu tratamiento es mejor para usted. Algunas personas obtienen mejora con una terapia. Sin embargo, Economist requieren una combinacin de terapias.  Document Released: 04/05/2013 Document Revised: 04/25/2014 [facility] Patient Information 2015 [place], [place]. This information is not intended to replace advice given to you by your health care provider. Make sure you discuss any questions you have with your health care provider.

## 2015-01-26 NOTE — Progress Notes (Signed)
Patient ID: [name] [name], female   DOB: 03-07-1951, 64 y.o.   MRN: [id number]   [name] [name], is a 64 y.o. female  [id number]  [id number]  DOB - [birth year]  Chief Complaint  Patient presents with  . Follow-up    blood work abnormal liver enzymes  . Hypertension        Subjective:   [name] [name] is a 64 y.o. female here today for a follow up visit. Patient has history of osteoporosis and hypertension, was recently seen in the urgent care for abdominal pain, evaluation revealed mild elevated transaminases. Pt comes in today to f/u abnormal LFT's 12/14/15. Patient's epigastric pain with n/v/d has resolved. Prescribed Protonix and Phenergan. States she stopped taking Protonix due to constipation. Denies abdominal pain today. Needs Flu vaccine Patient has No headache, No chest pain, No abdominal pain - No Nausea, No new weakness tingling or numbness, No Cough - SOB.  Problem  Transaminasemia  Generalized Anxiety Disorder    ALLERGIES: No Known Allergies  PAST MEDICAL HISTORY: Past Medical History  Diagnosis Date  . Arthritis   . Osteoporosis   . Hypertension     MEDICATIONS AT HOME: Prior to Admission medications   Medication Sig Start Date End Date Taking? Authorizing Provider  lisinopril (PRINIVIL,ZESTRIL) 20 MG tablet Take 1 tablet (20 mg total) by mouth daily. 07/28/14  Yes Quentin Angst, MD  simvastatin (ZOCOR) 20 MG tablet Take 1 tablet (20 mg total) by mouth at bedtime. 07/28/14  Yes Quentin Angst, MD  dextromethorphan-guaiFENesin (MUCINEX DM) 30-600 MG per 12 hr tablet Take 1 tablet by mouth 2 (two) times daily. Patient not taking: Reported on 01/26/2015 12/27/14   Quentin Angst, MD  diclofenac (VOLTAREN) 75 MG EC tablet Take 1 tablet (75 mg total) by mouth 2 (two) times daily. Patient not taking: Reported on 01/26/2015 12/05/14   Quentin Angst, MD  hydrOXYzine (ATARAX/VISTARIL) 25 MG tablet Take 1 tablet (25 mg total) by  mouth at bedtime. 01/26/15   Quentin Angst, MD  pantoprazole (PROTONIX) 40 MG tablet Take 1 tablet (40 mg total) by mouth daily. Patient not taking: Reported on 12/27/2014 12/13/14   Collie Siad English, PA  piroxicam (FELDENE) 20 MG capsule Take 1 capsule (20 mg total) by mouth daily. As needed for leg pains. Spanish label please Patient not taking: Reported on 12/27/2014 07/12/13   Pearline Cables, MD  promethazine (PHENERGAN) 25 MG tablet Take 0.5 tablets (12.5 mg total) by mouth every 8 (eight) hours as needed for nausea or vomiting. Patient not taking: Reported on 12/27/2014 02/10/14   Richarda Overlie, MD  risedronate (ACTONEL) 35 MG tablet Take 35 mg by mouth every 7 (seven) days. with water on empty stomach, nothing by mouth or lie down for next 30 minutes.    Historical Provider, MD     Objective:   Filed Vitals:   01/26/15 1028  BP: 144/77  Pulse: 63  Temp: 98.5 F (36.9 C)  TempSrc: Oral  Resp: 16  Height:  (1.549 m)  Weight: 182 lb (82.555 kg)  SpO2: 97%    Exam General appearance : Awake, alert, not in any distress. Speech Clear. Not toxic looking HEENT: Atraumatic and Normocephalic, pupils equally reactive to light and accomodation Neck: supple, no JVD. No cervical lymphadenopathy.  Chest:Good air entry bilaterally, no added sounds  CVS: S1 S2 regular, no murmurs.  Abdomen: Bowel sounds present, Non tender and not distended with no gaurding, rigidity or rebound. Extremities: B/L Lower  Ext shows no edema, both legs are warm to touch Neurology: Awake alert, and oriented X 3, CN II-XII intact, Non focal  Data Review Lab Results  Component Value Date   HGBA1C 5.6 11/08/2013     Assessment & Plan   1. Transaminasemia  - Hepatic Function Panel - Urinalysis, Complete  2. Generalized anxiety disorder  - hydrOXYzine (ATARAX/VISTARIL) 25 MG tablet; Take 1 tablet (25 mg total) by mouth at bedtime.  Dispense: 30 tablet; Refill: 3   Patient was counseled  extensively about nutrition and exercise.  Interpreter was used to communicate directly with patient for the entire encounter including providing detailed patient instructions.    Return in about 3 months (around 04/26/2015) for Follow up HTN, Follow up Pain and comorbidities, Generalized Anxiety Disorder.  The patient was given clear instructions to go to ER or return to medical center if symptoms don't improve, worsen or new problems develop. The patient verbalized understanding. The patient was told to call to get lab results if they haven't heard anything in the next week.   This note has been created with Education officer, environmentalDragon speech recognition software and smart phrase technology. Any transcriptional errors are unintentional.    Jeanann LewandowskyJEGEDE, Abbott Jasinski, MD, MHA, FACP, FAAP [facility] and [facility] [place] , [place] [phone]   01/26/2015, 11:20 AM

## 2015-01-26 NOTE — Progress Notes (Signed)
Pt comes in today to f/u abnormal LFT's 12/14/15 Pt [facility] epigastric pain with n/v/d, which has resolved Prescribed Protonix and Phenergan States she stopped taking Protonix due to constipation? Denies abdominal pain today  Here for repeat labs Need Flu vaccine  Spanish interpretor present

## 2015-01-27 LAB — URINALYSIS, COMPLETE
Bacteria, UA: NONE SEEN
Bilirubin Urine: NEGATIVE
CASTS: NONE SEEN
CRYSTALS: NONE SEEN
Glucose, UA: NEGATIVE mg/dL
HGB URINE DIPSTICK: NEGATIVE
KETONES UR: NEGATIVE mg/dL
Leukocytes, UA: NEGATIVE
Nitrite: NEGATIVE
PH: 7 (ref 5.0–8.0)
PROTEIN: NEGATIVE mg/dL
SPECIFIC GRAVITY, URINE: 1.006 (ref 1.005–1.030)
SQUAMOUS EPITHELIAL / LPF: NONE SEEN
Urobilinogen, UA: 0.2 mg/dL (ref 0.0–1.0)

## 2015-02-03 ENCOUNTER — Ambulatory Visit: Payer: No Typology Code available for payment source | Attending: Internal Medicine

## 2015-02-07 ENCOUNTER — Telehealth: Payer: Self-pay | Admitting: *Deleted

## 2015-02-07 NOTE — Telephone Encounter (Signed)
Left voice message to rerun call (message in Spanish)    Notes Recorded by Quentin Angstlugbemiga E Jegede, MD on 02/01/2015 at 5:19 PM Please inform patient that her liver function and urine test were normal.

## 2015-03-03 NOTE — Progress Notes (Signed)
Patient ID: [name] [name], female   DOB: Mar 30, 1951, 64 y.o.   MRN: [id number]   [name] [name], is a 64 y.o. female  [id number]  [id number]  DOB - 08-06-1951  Chief Complaint  Patient presents with  . Follow-up  . Back Pain        Subjective:   [name] [name] is a 64 y.o. female here today for a follow up visit. Patient with history of osteoporosis and hypertension, here today for follow-up of hypertension. Patient is complaining of lower back pain radiating to bilat groin area. States the pain is ongoing for months unrelieved by otc medication. States lower pelvic pain started today. Denies chills or fever. Patient has No headache, No chest pain, No abdominal pain - No Nausea, No new weakness tingling or numbness, No Cough - SOB.  No problems updated.  ALLERGIES: No Known Allergies  PAST MEDICAL HISTORY: Past Medical History  Diagnosis Date  . Arthritis   . Osteoporosis   . Hypertension     MEDICATIONS AT HOME: Prior to Admission medications   Medication Sig Start Date End Date Taking? Authorizing Provider  lisinopril (PRINIVIL,ZESTRIL) 20 MG tablet Take 1 tablet (20 mg total) by mouth daily. 07/28/14  Yes Quentin Angst, MD  simvastatin (ZOCOR) 20 MG tablet Take 1 tablet (20 mg total) by mouth at bedtime. 07/28/14  Yes Quentin Angst, MD  dextromethorphan-guaiFENesin (MUCINEX DM) 30-600 MG per 12 hr tablet Take 1 tablet by mouth 2 (two) times daily. Patient not taking: Reported on 01/26/2015 12/27/14   Quentin Angst, MD  diclofenac (VOLTAREN) 75 MG EC tablet Take 1 tablet (75 mg total) by mouth 2 (two) times daily. Patient not taking: Reported on 01/26/2015 12/05/14   Quentin Angst, MD  hydrOXYzine (ATARAX/VISTARIL) 25 MG tablet Take 1 tablet (25 mg total) by mouth at bedtime. 01/26/15   Quentin Angst, MD  pantoprazole (PROTONIX) 40 MG tablet Take 1 tablet (40 mg total) by mouth daily. Patient not taking: Reported on 12/27/2014  12/13/14   Collie Siad English, PA  piroxicam (FELDENE) 20 MG capsule Take 1 capsule (20 mg total) by mouth daily. As needed for leg pains. Spanish label please Patient not taking: Reported on 12/27/2014 07/12/13   Pearline Cables, MD  promethazine (PHENERGAN) 25 MG tablet Take 0.5 tablets (12.5 mg total) by mouth every 8 (eight) hours as needed for nausea or vomiting. Patient not taking: Reported on 12/27/2014 02/10/14   Richarda Overlie, MD  risedronate (ACTONEL) 35 MG tablet Take 35 mg by mouth every 7 (seven) days. with water on empty stomach, nothing by mouth or lie down for next 30 minutes.    Historical Provider, MD     Objective:   Filed Vitals:   12/05/14 1446  BP: 149/82  Pulse: 63  Temp: 98.7 F (37.1 C)  TempSrc: Oral  Resp: 18  Height:  (1.499 m)  Weight: 185 lb (83.915 kg)  SpO2: 98%    Exam General appearance : Awake, alert, not in any distress. Speech Clear. Not toxic looking HEENT: Atraumatic and Normocephalic, pupils equally reactive to light and accomodation Neck: supple, no JVD. No cervical lymphadenopathy.  Chest:Good air entry bilaterally, no added sounds  CVS: S1 S2 regular, no murmurs.  Abdomen: Bowel sounds present, Non tender and not distended with no gaurding, rigidity or rebound. Extremities: B/L Lower Ext shows no edema, both legs are warm to touch Neurology: Awake alert, and oriented X 3, CN II-XII intact, Non focal Skin:No Rash  Wounds:N/A  Data Review Lab Results  Component Value Date   HGBA1C 5.6 11/08/2013     Assessment & Plan   1. Low back pain without sciatica, unspecified back pain laterality  - Urinalysis Dipstick was negative for UTI - diclofenac (VOLTAREN) 75 MG EC tablet; Take 1 tablet (75 mg total) by mouth 2 (two) times daily. (Patient not taking: Reported on 01/26/2015)  Dispense: 30 tablet; Refill: 0  2. Encounter for immunization Flu and pneumonia vaccine given today  3. Essential hypertension Continue lisinopril 20 mg  tablet by mouth daily. - DASH Diet  Interpreter was used to communicate directly with patient for the entire encounter including providing detailed patient instructions.   Return in about 3 months (around 03/06/2015), or if symptoms worsen or fail to improve, for Follow up HTN, Follow up Pain and comorbidities.  The patient was given clear instructions to go to ER or return to medical center if symptoms don't improve, worsen or new problems develop. The patient verbalized understanding. The patient was told to call to get lab results if they haven't heard anything in the next week.   This note has been created with Education officer, environmentalDragon speech recognition software and smart phrase technology. Any transcriptional errors are unintentional.    Jeanann LewandowskyJEGEDE, Maurion Walkowiak, MD, MHA, FACP, FAAP [facility] and [facility] [place], [place] [phone]   12/05/2014

## 2015-03-28 ENCOUNTER — Encounter (HOSPITAL_COMMUNITY): Payer: Self-pay | Admitting: Emergency Medicine

## 2015-03-28 ENCOUNTER — Emergency Department (HOSPITAL_COMMUNITY)
Admission: EM | Admit: 2015-03-28 | Discharge: 2015-03-28 | Disposition: A | Discharge status: Home / Self Care | Payer: Self-pay | Source: Home / Self Care | Attending: Family Medicine | Admitting: Family Medicine

## 2015-03-28 ENCOUNTER — Emergency Department (INDEPENDENT_AMBULATORY_CARE_PROVIDER_SITE_OTHER): Payer: No Typology Code available for payment source

## 2015-03-28 DIAGNOSIS — Q6652 Congenital pes planus, left foot: Secondary | ICD-10-CM

## 2015-03-28 MED ORDER — MELOXICAM 7.5 MG PO TABS: 7.5000 mg | ORAL_TABLET | Freq: Two times a day (BID) | ORAL | Status: DC

## 2015-03-28 NOTE — ED Provider Notes (Signed)
CSN: [id number]     Arrival date & time 03/28/15  [id number] History   First MD Initiated Contact with Patient 03/28/15 1016     Chief Complaint  Patient presents with  . Foot Pain   (Consider location/radiation/quality/duration/timing/severity/associated sxs/prior Treatment) Patient is a 64 y.o. female presenting with lower extremity pain. The history is provided by the patient. The history is limited by a language barrier. A language interpreter was used (staff member assisted).  Foot Pain This is a chronic problem. The current episode started more than 1 week ago. The problem has been gradually worsening. The symptoms are aggravated by walking.    Past Medical History  Diagnosis Date  . Arthritis   . Osteoporosis   . Hypertension    History reviewed. No pertinent past surgical history. History reviewed. No pertinent family history. History  Substance Use Topics  . Smoking status: Never Smoker   . Smokeless tobacco: Not on file  . Alcohol Use: No   OB History    No data available     Review of Systems  Constitutional: Negative.   Musculoskeletal: Positive for joint swelling and gait problem.  Skin: Negative.     Allergies  Review of patient's allergies indicates no known allergies.  Home Medications   Prior to Admission medications   Medication Sig Start Date End Date Taking? Authorizing Provider  lisinopril (PRINIVIL,ZESTRIL) 20 MG tablet Take 1 tablet (20 mg total) by mouth daily. 07/28/14  Yes Quentin Angstlugbemiga E Jegede, MD  simvastatin (ZOCOR) 20 MG tablet Take 1 tablet (20 mg total) by mouth at bedtime. 07/28/14  Yes Quentin Angstlugbemiga E Jegede, MD  dextromethorphan-guaiFENesin (MUCINEX DM) 30-600 MG per 12 hr tablet Take 1 tablet by mouth 2 (two) times daily. Patient not taking: Reported on 01/26/2015 12/27/14   Quentin Angstlugbemiga E Jegede, MD  diclofenac (VOLTAREN) 75 MG EC tablet Take 1 tablet (75 mg total) by mouth 2 (two) times daily. Patient not taking: Reported on 01/26/2015 12/05/14    Quentin Angstlugbemiga E Jegede, MD  hydrOXYzine (ATARAX/VISTARIL) 25 MG tablet Take 1 tablet (25 mg total) by mouth at bedtime. 01/26/15   Quentin Angstlugbemiga E Jegede, MD  pantoprazole (PROTONIX) 40 MG tablet Take 1 tablet (40 mg total) by mouth daily. Patient not taking: Reported on 12/27/2014 12/13/14   Collie SiadStephanie D English, PA  piroxicam (FELDENE) 20 MG capsule Take 1 capsule (20 mg total) by mouth daily. As needed for leg pains. Spanish label please Patient not taking: Reported on 12/27/2014 07/12/13   Pearline CablesJessica C Copland, MD  promethazine (PHENERGAN) 25 MG tablet Take 0.5 tablets (12.5 mg total) by mouth every 8 (eight) hours as needed for nausea or vomiting. Patient not taking: Reported on 12/27/2014 02/10/14   Richarda OverlieNayana Abrol, MD  risedronate (ACTONEL) 35 MG tablet Take 35 mg by mouth every 7 (seven) days. with water on empty stomach, nothing by mouth or lie down for next 30 minutes.    Historical Provider, MD   BP 159/79 mmHg  Pulse 58  Temp(Src) 98.7 F (37.1 C) (Oral)  Resp 20  SpO2 96% Physical Exam  Constitutional: She is oriented to person, place, and time. She appears well-developed and well-nourished.  Musculoskeletal: She exhibits tenderness.       Left foot: There is decreased range of motion, tenderness, bony tenderness and deformity.       Feet:  Neurological: She is alert and oriented to person, place, and time.  Skin: Skin is warm and dry.  Nursing note and vitals reviewed.   ED  Course  Procedures (including critical care time) Labs Review Labs Reviewed - No data to display  Imaging Review Dg Foot Complete Left  03/28/2015   CLINICAL DATA:  Ankle swelling, no known injury, pain  EXAM: LEFT FOOT - COMPLETE 3+ VIEW  COMPARISON:  None  FINDINGS: Three views of the left foot submitted. No acute fracture or subluxation. There is diffuse osteopenia. Small plantar spur of calcaneus.  IMPRESSION: No acute fracture or subluxation.  Diffuse osteopenia.   Electronically Signed   By: Natasha Mead M.D.   On:  03/28/2015 10:37    X-rays reviewed and report per radiologist.  MDM   1. Pes planus, congenital, left        Linna Hoff, MD 03/28/15 1105

## 2015-03-28 NOTE — ED Notes (Signed)
Pt has had pain in her left foot, radiating into her shin for about 1 month.  Pt denies any falls or injury to the foot.

## 2015-03-28 NOTE — Discharge Instructions (Signed)
See orthopedist as advised for further foot eval.

## 2015-04-12 ENCOUNTER — Encounter: Payer: Self-pay | Admitting: Family Medicine

## 2015-04-12 ENCOUNTER — Telehealth: Payer: Self-pay | Admitting: *Deleted

## 2015-04-12 ENCOUNTER — Ambulatory Visit: Payer: No Typology Code available for payment source | Attending: Internal Medicine | Admitting: Family Medicine

## 2015-04-12 ENCOUNTER — Telehealth: Payer: Self-pay | Admitting: Internal Medicine

## 2015-04-12 VITALS — BP 162/82 | HR 64 | Temp 99.1°F | Resp 18 | Ht 59.5 in | Wt 182.8 lb

## 2015-04-12 DIAGNOSIS — J069 Acute upper respiratory infection, unspecified: Secondary | ICD-10-CM

## 2015-04-12 NOTE — Patient Instructions (Signed)
Infeccin de las vas areas superiores en los adultos (Upper Respiratory Infection, Adult)  La infeccin respiratoria de las vas areas superiores se conoce tambin como resfro comn. Las vas areas superiores incluyen los senos nasales, la garganta, la trquea, y los bronquios. Los bronquios son las vas areas que conducen el aire a los pulmones. La mayor parte de las personas mejora luego de una semana, pero los sntomas pueden durar hasta dos semanas. La tos residual puede durar ms. CAUSAS Varios tipos de virus pueden causar la infeccin de los tejidos que cubren las vas areas superiores. Los tejidos se irritan y se inflaman y se originan secreciones. Tambin es frecuente la produccin de moco. El resfro es contagioso. El virus se disemina fcilmente a otras personas por contacto oral. Aqu se incluyen los besos, el compartir un vaso y el toser o estornudar. Tambin puede diseminarse tocndose la boca o la nariz y luego tocando una superficie que luego tocan otras personas.  SNTOMAS Los sntomas se desarrollan entre uno y tres das luego de entrar en contacto con el virus. Pueden variar de una persona a otra. Incluyen:  Secrecin nasal.  Estornudos  Congestin nasal.  Irritacin de los senos nasales.  Dolor de garganta.  Prdida de la voz (laringitis).  Tos.  Fatiga.  Dolores musculares.  Prdida del apetito.  Dolor de cabeza.  Fiebre no muy elevada. DIAGNSTICO Puede diagnosticarse a s mismo la infeccin respiratoria, segn los sntomas habituales, ya que la mayor parte de las personas se resfra dos o tres veces al ao. El profesional puede confirmarlo basndose en el examen fsico. Lo ms importante es que el profesional verifique que los sntomas no se deben a otra enfermedad como anginas, sinusitis, neumona, asma o epiglotitis. Para diagnosticar el resfrio comn, no es necesario que haga anlisis de sangre, pruebas en la garganta o radiografas, pero en algunos  casos puede ser de utilidad para excluir otros problemas ms graves. El mdico decidir si necesita otras pruebas. RIESGOS Y COMPLICACIONES Tendr mayor riesgo de sufrir un resfro grave si consume cigarrillos, sufre una enfermedad cardaca (como insuficiencia cardaca) o pulmonar crnica (como asma) o si tiene un debilitamiento del sistema inmunolgico. Las personas muy jvenes o muy mayores tienen riesgo de sufrir infecciones ms graves. La sinusitis bacteriana, las infecciones del odo medio y la neumona bacteriana pueden complicar el resfro comn. El resfro puede exacerbar el asma y la enfermedad pulmonar obstructiva crnica. En algunos casos estas complicaciones requieren la atencin en un servicio de emergencias y pueden poner en peligro la vida. PREVENCIN La mejor manera de protegerse para no contraer un resfro es mantener una buena higiene. Evite el contacto bucal o de las manos con personas con sntomas de resfro. Si se produce el contacto, lvese las manos con frecuencia. No hay pruebas firmes que indiquen que la vitamina C, la vitamina E, la equincea o la actividad fsica reduzcan las posibilidades de tener una infeccin. Sin embargo, siempre se recomienda descansar mucho y tener una buena nutricin. TRATAMIENTO El tratamiento est dirigido a aliviar los sntomas. Esta enfermedad no tiene cura. Los antibiticos no son eficaces, ya que esta infeccin la causa un virus y no una bacteria. El tratamiento incluye:  Aumente la ingesta de lquidos. Consumo de bebidas deportivas, que proporcionan electrolitos,azcares e hidratacin.  Inhale vapor caliente (de un vaporizador o de la ducha).  Tomar sopa de pollo u otros lquidos claros, y mantener una buena nutricin.  Descanse lo suficiente.  Haga grgaras o coma pastillas para aliviar   las molestias.  Control de la fiebre con ibuprofeno o acetaminofen, segn las indicaciones del mdico.  Aumento del uso del inhalador, si sufre asma. Las  pastillas y los geles de zinc durante las primeras 24 horas de iniciado el resfro comn, pueden disminuir la duracin y Paramedicaliviar la gravedad de los sntomas. Los medicamentos para Chief Technology Officerel dolor pueden disminuir la fiebre, Paramedicaliviar los dolores musculares y Chief Technology Officerel dolor de Advertising copywritergarganta. Se dispone de una gran variedad de medicamentos de venta libre para tratar la congestin y la secrecin nasal. El profesional podr recomendarle inhalantes para los otros sntomas. INSTRUCCIONES PARA EL CUIDADO DOMICILIARIO  Utilice los medicamentos de venta libre o de prescripcin para Chief Technology Officerel dolor, el malestar o la [place]iebre, segn se lo indique el profesional que lo asiste.  Utilice un vaporizador caliente o inhale vapor, haciendo salir agua de la ducha para aumentar la humedad [place]ambiente. Esto mantendr las secreciones hmedas y Community education officerle resultar ms fcil respirar.  Beba gran cantidad de lquido para mantener la orina de tono claro o color amarillo plido.  Descanse todo lo que pueda.  Regrese a su trabajo cuando la temperatura se haya normalizado, o cuando el profesional que lo asiste se lo indique. Quizs sea necesario que permanezca en su casa durante un tiempo ms prolongado para Buyer, retailevitar infectar a Economistotras personas. Tambin puede utilizar un barbijo y ser cuidadoso con el lavado de manos para evitar la diseminacin del virus. SOLICITE ATENCIN MDICA SI:  Luego de los primeros das siente que empeora en vez de [place]jorar.  Necesita que Occupational psychologistel profesional le brinde ms informacin relacionada con los medicamentos para AGCO Corporationcontrolar los sntomas.  Siente escalofros, le falta el aire o escupe moco de color marrn o rojo. Estos pueden ser sntomas de neumona.  Tiene una secrecin nasal de color amarillo o marrn, o siente dolor en el rostro, especialmente cuando se inclina hacia adelante. Estos pueden ser sntomas de sinusitis.  Tiene fiebre, siente el cuello hinchado, tiene dolor al tragar u observa manchas blancas en el fondo de la garganta.  Estos pueden ser sntomas de angina por estreptococo. Romona CurlsSOLICITE ATENCIN MDICA DE INMEDIATO SI:  Lance Mussiene fiebre.  Comienza a sentir Herbalistun dolor de cabeza intenso o persistente, dolor de odos, en el seno nasal o en el pecho.  Tiene tos y esta se prolonga demasiado, tose y escupe sangre, la mucosidad habitual se modifica (si tiene una enfermedad pulmonar crnica) o respira con dificultad.  Siente rigidez en el cuello o dolor de cabeza intenso. Document Released: 09/18/2005 Document Revised: 03/02/2012 [facility] Patient Information 2015 [place], [place]. This information is not intended to replace advice given to you by your health care provider. Make sure you discuss any questions you have with your health care provider.  Follow-up if getting worse after a week or not getting better after 10 days. Do not take Pseudophrine because of blood pressure May use warm salt water gargles, Tylenol, motrin, throat logenzes, cough drops, robitussin, Nyquil.

## 2015-04-12 NOTE — Telephone Encounter (Signed)
Pt calling to clarify what medications she is supposed to be taking and which ones require refills.  Pt is confused by the list of current medications that were printed on her AVS.  Please f/u with pt.

## 2015-04-12 NOTE — Progress Notes (Signed)
Subjective:     Patient ID: [name] [name], female   DOB: 01/08/1951, 64 y.o.   MRN: [id number]  HPI   Patient presents with a 2-3 day history of upper respiratory symptoms. She states is unsure if cold or allergies.  She started with stuffy nose, leading to runny nose, eye burning, sorethroat, cough, body aches and feeling feverish. She did not confirm fever.   Review of Systems  See HPI    Objective:   Physical Exam  Alert, oriented, appropriate in no distress. Skin is warm and dry. TMS clear, conjunctiva mildly injected, throat with mild generalized erythema without tonsillar swelling or exudate. Neck is supple FROM w/o adenopathy or tenderness. Lungs are clear to auscultation. HS are regular w/o m,g,r     Assessment:    Viral Upper Respiratory infection    Plan:     I  Have explained why I think this is cold and not allergies.   I have provided URI education in spanish Have recommended OTC symptomatic measure but no pseudoephidrine. She should follow-up if getting worse after a week or not getting better after 10 days.

## 2015-04-12 NOTE — Telephone Encounter (Signed)
Spoke with patient's daughter because mother was not at home.  Mother had called in earlier saying she was confused as to what Concepcion LivingLinda Bernhardt wanted her to take for her upper respiratory infection.  Spoke with the daughter via [id number].  I explained that she could take Tylenol or motrin or aches, pains or headache, fever, cough drops and eith Robitussin or Nyquil for cough.  She was not to take any medication with pseudoephedrine in it because it can raise her blood pressure.  Patient's daughter verbalized understanding and said she would pass information on to her mother.

## 2015-04-12 NOTE — Progress Notes (Signed)
Patient complaining of three day history of headache, runny nose. Patient indicates she has felt feverish but did not check temperature at home. Patient also indicates she has had increased fatigue and developed sore throat yesterday. She indicates she feels like she has something caught in throat. Patient taking Nyquil for symptom relief. Interpretor used for Systems analystlanguage translation.

## 2015-04-15 ENCOUNTER — Emergency Department (INDEPENDENT_AMBULATORY_CARE_PROVIDER_SITE_OTHER)
Admission: EM | Admit: 2015-04-15 | Discharge: 2015-04-15 | Disposition: A | Discharge status: Home / Self Care | Payer: Self-pay | Source: Home / Self Care | Attending: Family Medicine | Admitting: Family Medicine

## 2015-04-15 ENCOUNTER — Emergency Department (INDEPENDENT_AMBULATORY_CARE_PROVIDER_SITE_OTHER): Payer: Self-pay

## 2015-04-15 DIAGNOSIS — J4530 Mild persistent asthma, uncomplicated: Secondary | ICD-10-CM

## 2015-04-15 DIAGNOSIS — J302 Other seasonal allergic rhinitis: Secondary | ICD-10-CM

## 2015-04-15 MED ORDER — ALBUTEROL SULFATE (2.5 MG/3ML) 0.083% IN NEBU
INHALATION_SOLUTION | RESPIRATORY_TRACT | Status: AC
  Filled 2015-04-15: qty 3

## 2015-04-15 MED ORDER — ALBUTEROL SULFATE HFA 108 (90 BASE) MCG/ACT IN AERS: 2.0000 | INHALATION_SPRAY | RESPIRATORY_TRACT | Status: DC | PRN

## 2015-04-15 MED ORDER — TRIAMCINOLONE ACETONIDE 40 MG/ML IJ SUSP
40.0000 mg | Freq: Once | INTRAMUSCULAR | Status: AC
  Administered 2015-04-15: 40 mg via INTRAMUSCULAR

## 2015-04-15 MED ORDER — PREDNISONE 20 MG PO TABS: ORAL_TABLET | ORAL | Status: DC

## 2015-04-15 MED ORDER — IPRATROPIUM-ALBUTEROL 0.5-2.5 (3) MG/3ML IN SOLN
RESPIRATORY_TRACT | Status: AC
  Filled 2015-04-15: qty 3

## 2015-04-15 MED ORDER — ALBUTEROL SULFATE (2.5 MG/3ML) 0.083% IN NEBU
2.5000 mg | INHALATION_SOLUTION | Freq: Once | RESPIRATORY_TRACT | Status: AC
  Administered 2015-04-15: 2.5 mg via RESPIRATORY_TRACT

## 2015-04-15 MED ORDER — TRIAMCINOLONE ACETONIDE 40 MG/ML IJ SUSP
INTRAMUSCULAR | Status: AC
  Filled 2015-04-15: qty 1

## 2015-04-15 MED ORDER — IPRATROPIUM-ALBUTEROL 0.5-2.5 (3) MG/3ML IN SOLN
3.0000 mL | Freq: Once | RESPIRATORY_TRACT | Status: AC
  Administered 2015-04-15: 3 mL via RESPIRATORY_TRACT

## 2015-04-15 NOTE — ED Notes (Signed)
Pt  Reports  Symptoms  Of  Chills  Body  Aches        Dizzy   And     Nauseated  X  4  Days      Pt  Was   Seen  By  Her  PCP  4  Days  Ago  And  DX  With  Possible flu   Continues  To  Have   Symptoms

## 2015-04-15 NOTE — Discharge Instructions (Signed)
Rinitis alrgica (Allergic Rhinitis) Flonase or Rhinocort nasal spray every day Allegra or Zyrtec daily  La rinitis alrgica ocurre cuando las membranas mucosas de la nariz responden a los alrgenos. Los alrgenos son las partculas que estn en el aire y que hacen que el cuerpo tenga una reaccin Counselling psychologist. Esto hace que usted libere anticuerpos alrgicos. A travs de una cadena de eventos, estos finalmente hacen que usted libere histamina en la corriente sangunea. Aunque la funcin de la histamina es proteger al organismo, es esta liberacin de histamina lo que provoca malestar, como los estornudos frecuentes, la congestin y goteo y Control and instrumentation engineer.  CAUSAS  La causa de la rinitis Merchandiser, retail (fiebre del heno) son los alrgenos del polen que pueden provenir del csped, los rboles y Theme park manager. La causa de la rinitis IT consultant (rinitis alrgica perenne) son los alrgenos como los caros del polvo domstico, la caspa de las mascotas y las esporas del moho.  SNTOMAS  1. Secrecin nasal (congestin). 2. Goteo y picazn nasales con estornudos y Arboriculturist. DIAGNSTICO  Su mdico puede ayudarlo a Warehouse manager alrgeno o los alrgenos que desencadenan sus sntomas. Si usted y su mdico no pueden Chief Strategy Officer cul es el alrgeno, pueden hacerse anlisis de sangre o estudios de la piel. TRATAMIENTO  La rinitis alrgica no tiene [place], pero puede controlarse mediante lo siguiente:  Medicamentos y vacunas contra la alergia (inmunoterapia).  Prevencin del alrgeno. La fiebre del heno a menudo puede tratarse con antihistamnicos en las formas de pldoras o aerosol nasal. Los antihistamnicos bloquean los efectos de la histamina. Existen medicamentos de venta libre que pueden ayudar con la congestin nasal y la hinchazn alrededor de los ojos. Consulte a su mdico antes de tomar o administrarse este medicamento.  Si la prevencin del alrgeno o el medicamento recetado no dan resultado,  existen muchos medicamentos nuevos que su mdico puede recetarle. Pueden usarse medicamentos ms fuertes si las medidas iniciales no son efectivas. Pueden aplicarse inyecciones desensibilizantes si los medicamentos y la prevencin no funcionan. La desensibilizacin ocurre cuando un paciente recibe vacunas constantes hasta que el cuerpo se vuelve menos sensible al alrgeno. Asegrese de Medical sales representative seguimiento con su mdico si los problemas continan. INSTRUCCIONES PARA EL CUIDADO EN EL HOGAR No es posible evitar por completo los alrgenos, pero puede reducir los sntomas al tomar medidas para limitar su exposicin a ellos. Es muy til saber exactamente a qu es alrgico para que pueda evitar sus desencadenantes especficos. SOLICITE ATENCIN MDICA SI:   Lance Muss.  Desarrolla una tos que no se detiene fcilmente (persistente).  Le falta el aire.  Comienza a tener sibilancias.  Los sntomas interfieren con las actividades diarias normales. Document Released: 09/18/2005 Document Revised: 09/29/2013 [facility] Patient Information 2015 [place], [place]. This information is not intended to replace advice given to you by your health care provider. Make sure you discuss any questions you have with your health care provider.  Broncoespasmo (Bronchospasm) Albuterol inhaler and prednisone as directed El broncoespasmo se produce cuando los conductos que transportan el aire desde y [employer] pulmones (vas respiratorias) sufren un espasmo o se Engineer, technical sales. Durante un broncoespasmo es Control and instrumentation engineer. Esto se debe a que las vas respiratorias se Engineer, technical sales. El broncoespasmo puede ser desencadenado por: 3. Alergias. Puede ser a animales, polen, alimentos o moho. 4. Infeccin. Esta es una causa frecuente de broncoespasmo. 5. Actividad fsica. 6. Agentes irritantes. Por ejemplo, polucin, humo de cigarrillos, olores fuertes, aerosoles y vapores de [place]. 7. Los cambios climticos. 8. Estrs.  9. Estar  emocionado. CUIDADOS EN EL HOGAR   Cuente siempre con un plan para pedir ayuda. Sepa cundo debe llamar al mdico y a los servicios de emergencia de su localidad (911 en [place] Sepa dnde puede acceder a un servicio de emergencias.  Solo tome los medicamentos que le haya indicado su mdico.  Si le indicaron el uso de un inhalador o nebulizador, consulte a su mdico para que le explique cmo usarlo correctamente. Siempre use un espaciador con Therapist, nutritionalel inhalador, si le proporcionaron uno  Mantenga la calma durante el ataque. Trate de relajarse y respire ms lentamente.  Controle el ambiente de su casa:  Cambie el filtro de la calefaccin y el aire acondicionado al menos una vez al mes.  Limite el uso de hogares o estufas a lea.  No fume. No permita que fumen en su casa.  Evite la exposicin a perfumes y fragancias.  Elimine las plagas (como cucarachas y ratones) y sus excrementos.  Elimine las plantas si observa moho en ellas.  Mantenga su casa limpia y [place] Island[place]re de polvo.  Reemplace las alfombras por pisos de Wedge[place]madera, baldosas o vinilo. Las alfombras pueden retener la caspa de los animales y [place] polvo.  Use almohadas, mantas y cubre colchones antialrgicos.  Lave las sbanas y las mantas todas las semanas con agua caliente. Squelas en Luci Bankuna secadora.  Use mantas de polister o algodn.  Lvese las manos con frecuencia. SOLICITE AYUDA SI:  Tiene dolores musculares.  Siente dolor en el pecho.  El catarro espeso que elimina (esputo) cambia de un color claro o blanco a un color amarillo, verde, gris o sanguinolento.  El catarro espeso que elimina se hace ms espeso.  Tiene algn problema que pueda relacionarse con los medicamentos que est tomando como:  Una erupcin cutnea.  Picazn.  Hinchazn.  Problemas para respirar. SOLICITE AYUDA DE INMEDIATO SI:  No puede respirar normalmente.  No puede dejar de toser.  El tratamiento no lo ayuda a Solicitorrespirar mejor.  Siente un  dolor muy intenso en el pecho. ASEGRESE DE QUE:   Comprende estas instrucciones.  Controlar su afeccin.  Recibir ayuda de inmediato si no mejora o si empeora. Document Released: 01/11/2011 Document Revised: 12/14/2013 [facility] Patient Information 2015 [place]. This information is not intended to replace advice given to you by your health care provider. Make sure you discuss any questions you have with your health care provider.  Cmo usar Advertising account executiveun inhalador (How to Use an Inhaler) Es muy importante que sepa usar su Water engineerinhalador correctamente. Una buena tcnica garantizar que el medicamento llegue a los pulmones.  CMO USAR UN INHALADOR: 10. Retire la tapa del inhalador. 11. Si esta es la primera vez que [place] el [place]inhalador, debe prepararlo. Sacuda el inhalador durante 5segundos. Libere cuatro descargas en el aire, lejos del rostro. Si tiene preguntas, pdale ayuda al mdico. 12. Sacuda el inhalador durante 5segundos. 13. Gire el inhalador de modo que la botella quede por encima de la boquilla. 14. Coloque el dedo ndice por encima de la botella. El pulgar debe sujetar la parte inferior del inhalador. 15. Abra la boca. 16. Sostenga el inhalador lejos de la boca (un ancho de 2 dedos) o coloque los labios alrededor de la boquilla. Pregntele al mdico de qu manera debe usar Therapist, nutritionalel inhalador. 17. Exhale la mayor cantidad de aire que pueda. 18. Inhale y presione la botella hacia abajo 1vez para Pharmacologistliberar el medicamento. Sentir cmo el medicamento ingresa a la boca y Administratorla garganta. 19. Siga  inspirando profundamente, muy despacio. Trate de llenar los pulmones. 20. Despus de que haya inspirado profundamente, contenga la respiracin durante 10segundos. Esto ayudar a que el medicamento se asiente en los pulmones. Si no puede contener la respiracin durante 10segundos, contngala cuanto ms pueda antes de exhalar. 21. Exhale lentamente a travs de los labios fruncidos. Ponga los labios como cuando  silba. 22. Si el mdico le ha indicado que debe aspirar ms de 1 descarga, espere de 15 a 30segundos como mnimo entre Presenter, broadcasting. Esto ayudar a que [employer] del [place]. No use el inhalador ms veces de las que el [employer]. 23. Vuelva a colocar la tapa en el inhalador. 24. Siga las indicaciones del mdico o las instrucciones que vienen en la caja para Building control surveyor. Si [place] ms de Advertising account executive, pregntele al mdico qu inhaladores debe usar y en qu orden. Pdale al mdico que lo ayude a determinar cundo Higher education careers adviser a Field seismologist.  Si [place] un inhalador con corticoides, enjuguese siempre la boca con agua despus de la ltima descarga, hgase grgaras y escupa el agua. No trague el agua. SOLICITE AYUDA SI:  El medicamento del Armed forces operational officer solo lo ayuda parcialmente a TEFL teacher las sibilancias y las dificultades para Industrial/product designer.  Tiene dificultad para Academic librarian.  Experimenta un leve aumento de la expectoracin espesa (flema). SOLICITE AYUDA DE INMEDIATO SI:  El medicamento del inhalador no ayuda a TEFL teacher las sibilancias o la dificultad para respirar, o bien, si siente opresin en el pecho.  Siente mareos, dolor de cabeza o una frecuencia cardaca acelerada.  Siente escalofros, fiebre o sudores nocturnos.  Experimenta un aumento considerable de la expectoracin espesa o si observa sangre en dicha expectoracin espesa. ASEGRESE DE QUE:   Comprende estas instrucciones.  Controlar su afeccin.  Recibir ayuda de inmediato si no mejora o si empeora. Document Released: 01/11/2011 Document Revised: 09/29/2013 [facility] Patient Information 2015 [place], [place]. This information is not intended to replace advice given to you by your health care provider. Make sure you discuss any questions you have with your health care provider.

## 2015-04-15 NOTE — ED Provider Notes (Signed)
CSN: [id number]     Arrival date & time 04/15/15  1448 History   First MD Initiated Contact with Patient 04/15/15 1725     Chief Complaint  Patient presents with  . Chills   (Consider location/radiation/quality/duration/timing/severity/associated sxs/prior Treatment) HPI Comments: 64 year old female is complaining of a one-week history of PND, occasional dizziness, sore throat and subjective fever. Last night she was sweating and presumed she had a fever but did not measure it. She was seen by her PCP and treated with a bottle of OTC liquid. This has not helped much.   Past Medical History  Diagnosis Date  . Arthritis   . Osteoporosis   . Hypertension    No past surgical history on file. No family history on file. History  Substance Use Topics  . Smoking status: Never Smoker   . Smokeless tobacco: Not on file  . Alcohol Use: No   OB History    No data available     Review of Systems  Constitutional: Positive for fever, chills and activity change.  HENT: Positive for congestion and postnasal drip. Negative for trouble swallowing.   Eyes: Negative.   Respiratory: Positive for cough. Negative for choking and wheezing.   Cardiovascular:       Pain across the anterior chest and lower abdomen when coughing only.  Gastrointestinal: Negative.   Genitourinary: Negative.   Musculoskeletal: Negative.   Neurological: Positive for dizziness. Negative for tremors, syncope and speech difficulty.    Allergies  Review of patient's allergies indicates no known allergies.  Home Medications   Prior to Admission medications   Medication Sig Start Date End Date Taking? Authorizing Provider  albuterol (PROVENTIL HFA;VENTOLIN HFA) 108 (90 BASE) MCG/ACT inhaler Inhale 2 puffs into the lungs every 4 (four) hours as needed for wheezing or shortness of breath. 04/15/15   Hayden Rasmussenavid Kholton Coate, NP  predniSONE (DELTASONE) 20 MG tablet Take 3 tabs po on first day, 2 tabs second day, 2 tabs third day, 1 tab  fourth day, 1 tab 5th day. Take with food. Start 04/16/15. 04/15/15   Hayden Rasmussenavid Orma Cheetham, NP  risedronate (ACTONEL) 35 MG tablet Take 35 mg by mouth every 7 (seven) days. with water on empty stomach, nothing by mouth or lie down for next 30 minutes.    Historical Provider, MD  simvastatin (ZOCOR) 20 MG tablet Take 1 tablet (20 mg total) by mouth at bedtime. 07/28/14   Quentin Angstlugbemiga E Jegede, MD   Temp(Src) 99.8 F (37.7 C) (Oral)  Resp 16  SpO2 99% Physical Exam  Constitutional: She appears well-developed and well-nourished. No distress.  HENT:  Mouth/Throat: No oropharyngeal exudate.  Bilateral TMs are retracted Oropharynx with cobblestoning and moderate amount of clear PND.  Eyes: EOM are normal.  Neck: Normal range of motion. Neck supple.  Cardiovascular: Normal rate, regular rhythm and normal heart sounds.   Pulmonary/Chest: Effort normal. She exhibits tenderness.  With forced expiration there is bilateral coarseness and wheezing. Cough increases the intensity of the coarseness.  Abdominal: Soft. There is no tenderness.  Musculoskeletal: Normal range of motion.  Neurological: She is alert. No cranial nerve deficit. She exhibits normal muscle tone.  Skin: Skin is warm and dry.  Psychiatric: She has a normal mood and affect.  Nursing note and vitals reviewed.   ED Course  Procedures (including critical care time) Labs Review Labs Reviewed - No data to display  Imaging Review Dg Chest 2 View  04/15/2015   CLINICAL DATA:  Cough.  Fever.  Dyspnea.  Shortness of breath.  EXAM: CHEST  2 VIEW  COMPARISON:  05/18/2004.  FINDINGS: Cardiopericardial silhouette is within normal limits. Mediastinal contours are normal. Basilar atelectasis. No airspace consolidation. Discoid atelectasis is evident on the lateral view. Cholecystectomy clips are present in the right upper quadrant. No airspace consolidation. No pleural effusion. Lower thoracic compression fracture is new compared to the prior exam from 2005  but age indeterminate.  IMPRESSION: 1. No acute cardiopulmonary disease. 2. Basilar atelectasis. 3. Age indeterminate but likely chronic inferior thoracic compression fracture.   Electronically Signed   By: Andreas Newport M.D.   On: 04/15/2015 18:59     MDM   1. Other seasonal allergic rhinitis   2. RAD (reactive airway disease) with wheezing, mild persistent, uncomplicated     Kenalog 40 mg IM. Post Duoneb 5 mg 2.5 mg much improvement, no coarseness or wheezing. Very good air movement. No cough. Flonase or Rhinocort nasal spray every day Allegra or Zyrtec daily Albuterol inhaler for cough and breathing Prednisone daily start tomorrow. Take deep breaths often.  Hayden Rasmussen, NP 04/15/15 1910

## 2015-04-19 ENCOUNTER — Ambulatory Visit: Payer: Self-pay | Attending: Internal Medicine | Admitting: Family Medicine

## 2015-04-19 ENCOUNTER — Encounter: Payer: Self-pay | Admitting: Family Medicine

## 2015-04-19 VITALS — BP 132/81 | HR 67 | Temp 98.3°F | Resp 18 | Ht 60.0 in | Wt 183.6 lb

## 2015-04-19 DIAGNOSIS — J069 Acute upper respiratory infection, unspecified: Secondary | ICD-10-CM | POA: Insufficient documentation

## 2015-04-19 MED ORDER — GUAIFENESIN-CODEINE 100-10 MG/5ML PO SOLN: 10.0000 mL | Freq: Three times a day (TID) | ORAL | Status: DC

## 2015-04-19 NOTE — Progress Notes (Signed)
Patient complaining of coughing and congestion x 2 weeks. She went to ED on 04/15/15 and was told she has seasonal rhinitis and reactive airway disease. Patient prescribed albuterol and prednisone at Urgent Care. Patient taking all meds as prescribed. Interpreter line used [phone]

## 2015-04-19 NOTE — Patient Instructions (Signed)
Lots of liquids. Use inhaler if needed for difficulty breathing Cough syrup, try to save for night time for cough and rest.

## 2015-04-19 NOTE — Progress Notes (Signed)
Subjective:     Patient ID: [name] ShireRamona Vazquez-Garcia, female   DOB: 07/18/1951, 64 y.o.   MRN: [id number]  HPI   Patient presents for a follow-up of an ED visit for upper airway congestion.  I saw her on 4/20 and diagnosed her with viral URI and suggested symptomatic measures. She had only had for 24-48 hours at that time. She went to ED on 4/26 and was diagnosed with URI with reactive airway disease. She was prescribe an albuterol inhaler and prednisone taper. She returns today because she is not well and the cough is keeping her awake. She denies any facial pain, ST, fever or chills.   Review of Systems   See HPI       Objective:   Physical Exam   Alert, oriented, appropriate, in no distress.  Lungs are clear to auscultation. HS are regular.    Assessment:     Viral URI with cough    Plan:     Guaifenesin with codiene, 10 ml no more than 3 times a day. I have recommended to reserve for nighttime use Use inhaler for difficulty breathing Stay upright most of the day Lots of liquids. Reassurance.   Henrietta HooverLinda C. Tearah Saulsbury, FNP-BC

## 2015-05-01 ENCOUNTER — Ambulatory Visit: Payer: Self-pay | Attending: Internal Medicine

## 2015-07-09 ENCOUNTER — Emergency Department (HOSPITAL_COMMUNITY)
Admission: EM | Admit: 2015-07-09 | Discharge: 2015-07-09 | Disposition: A | Discharge status: Home / Self Care | Payer: Self-pay | Attending: Emergency Medicine | Admitting: Emergency Medicine

## 2015-07-09 ENCOUNTER — Encounter (HOSPITAL_COMMUNITY): Payer: Self-pay | Admitting: Emergency Medicine

## 2015-07-09 DIAGNOSIS — Y9389 Activity, other specified: Secondary | ICD-10-CM | POA: Insufficient documentation

## 2015-07-09 DIAGNOSIS — M1990 Unspecified osteoarthritis, unspecified site: Secondary | ICD-10-CM | POA: Insufficient documentation

## 2015-07-09 DIAGNOSIS — Y9289 Other specified places as the place of occurrence of the external cause: Secondary | ICD-10-CM | POA: Insufficient documentation

## 2015-07-09 DIAGNOSIS — M810 Age-related osteoporosis without current pathological fracture: Secondary | ICD-10-CM | POA: Insufficient documentation

## 2015-07-09 DIAGNOSIS — I10 Essential (primary) hypertension: Secondary | ICD-10-CM | POA: Insufficient documentation

## 2015-07-09 DIAGNOSIS — T63441A Toxic effect of venom of bees, accidental (unintentional), initial encounter: Secondary | ICD-10-CM | POA: Insufficient documentation

## 2015-07-09 DIAGNOSIS — Y998 Other external cause status: Secondary | ICD-10-CM | POA: Insufficient documentation

## 2015-07-09 MED ORDER — PREDNISONE 20 MG PO TABS: 20.0000 mg | ORAL_TABLET | Freq: Two times a day (BID) | ORAL | Status: DC

## 2015-07-09 MED ORDER — DIPHENHYDRAMINE HCL 25 MG PO TABS: 25.0000 mg | ORAL_TABLET | Freq: Four times a day (QID) | ORAL | Status: DC | PRN

## 2015-07-09 MED ORDER — METHYLPREDNISOLONE SODIUM SUCC 125 MG IJ SOLR
125.0000 mg | Freq: Once | INTRAMUSCULAR | Status: AC
  Administered 2015-07-09: 125 mg via INTRAVENOUS
  Filled 2015-07-09: qty 2

## 2015-07-09 NOTE — ED Notes (Signed)
Received pt via EMS with c/o stung by a bee on left pinky finger. Pt has redness to B/L arms, neck and back. Pt took 25 mg of benadryl at home and was given another 25 MG of benadryl by EMS. Pt denies shortness of breath.

## 2015-07-09 NOTE — ED Provider Notes (Signed)
CSN: [id number]     Arrival date & time 07/09/15  1202 History   First MD Initiated Contact with Patient 07/09/15 1208     Chief Complaint  Patient presents with  . Insect Bite  . Allergic Reaction  . Abdominal Pain  . Nausea      HPI  Patient presents after being stung by a bee on her left hand. Itching and swelling to her small finger progressing into her forearm. No difficulty swallowing speech breathing or swelling of the mouth lips or tongue. No history of previous significant reactions or anaphylaxis.  Past Medical History  Diagnosis Date  . Arthritis   . Osteoporosis   . Hypertension    History reviewed. No pertinent past surgical history. No family history on file. History  Substance Use Topics  . Smoking status: Never Smoker   . Smokeless tobacco: Not on file  . Alcohol Use: No   OB History    No data available     Review of Systems  Constitutional: Negative for fever, chills, diaphoresis, appetite change and fatigue.  HENT: Negative for mouth sores, sore throat and trouble swallowing.   Eyes: Negative for visual disturbance.  Respiratory: Negative for cough, chest tightness, shortness of breath and wheezing.   Cardiovascular: Negative for chest pain.  Gastrointestinal: Negative for nausea, vomiting, abdominal pain, diarrhea and abdominal distention.  Endocrine: Negative for polydipsia, polyphagia and polyuria.  Genitourinary: Negative for dysuria, frequency and hematuria.  Musculoskeletal: Negative for gait problem.  Skin: Positive for rash. Negative for color change and pallor.  Neurological: Negative for dizziness, syncope, light-headedness and headaches.  Hematological: Does not bruise/bleed easily.  Psychiatric/Behavioral: Negative for behavioral problems and confusion.      Allergies  Review of patient's allergies indicates no known allergies.  Home Medications   Prior to Admission medications   Medication Sig Start Date End Date Taking?  Authorizing Provider  albuterol (PROVENTIL HFA;VENTOLIN HFA) 108 (90 BASE) MCG/ACT inhaler Inhale 2 puffs into the lungs every 4 (four) hours as needed for wheezing or shortness of breath. 04/15/15   Hayden Rasmussen, NP  diphenhydrAMINE (BENADRYL) 25 MG tablet Take 1 tablet (25 mg total) by mouth every 6 (six) hours as needed. 07/09/15   Rolland Porter, MD  guaiFENesin-codeine 100-10 MG/5ML syrup Take 10 mLs by mouth 3 (three) times daily. As needed but try to use only at night. 04/19/15   Henrietta Hoover, NP  predniSONE (DELTASONE) 20 MG tablet Take 1 tablet (20 mg total) by mouth 2 (two) times daily with a meal. 07/09/15   Rolland Porter, MD  risedronate (ACTONEL) 35 MG tablet Take 35 mg by mouth every 7 (seven) days. with water on empty stomach, nothing by mouth or lie down for next 30 minutes.    Historical Provider, MD  simvastatin (ZOCOR) 20 MG tablet Take 1 tablet (20 mg total) by mouth at bedtime. 07/28/14   Quentin Angst, MD   BP 143/70 mmHg  Pulse 60  Temp(Src) 97.8 F (36.6 C) (Oral)  Resp 22  Wt 183 lb 10.3 oz (83.3 kg)  SpO2 99% Physical Exam  Constitutional: She is oriented to person, place, and time. She appears well-developed and well-nourished. No distress.  HENT:  Head: Normocephalic.  Normal tongue and oral mucosa. Normal pharynx. No stridor. No wheezing.  Eyes: Conjunctivae are normal. Pupils are equal, round, and reactive to light. No scleral icterus.  Neck: Normal range of motion. Neck supple. No thyromegaly present.  Cardiovascular: Normal rate and regular  rhythm.  Exam reveals no gallop and no friction rub.   No murmur heard. Pulmonary/Chest: Effort normal and breath sounds normal. No respiratory distress. She has no wheezes. She has no rales.  Abdominal: Soft. Bowel sounds are normal. She exhibits no distension. There is no tenderness. There is no rebound.  Musculoskeletal: Normal range of motion.  Neurological: She is alert and oriented to person, place, and time.  Skin:  Skin is warm and dry. No rash noted.     Psychiatric: She has a normal mood and affect. Her behavior is normal.    ED Course  Procedures (including critical care time) Labs Review Labs Reviewed - No data to display  Imaging Review No results found.   EKG Interpretation None      MDM   Final diagnoses:  Bee sting, accidental or unintentional, initial encounter    Deserve for an hour. Given IV site Medrol. Benadryl. No progression. Has a mostly resolution of urticaria. No GI, pulmonary ENT symptoms. Plan is discharged home.    Rolland PorterMark Shayn Madole, MD 07/09/15 1530

## 2015-07-09 NOTE — Discharge Instructions (Signed)
Picadura de insectos Surveyor, minerals(Insect Bite)  Los mosquitos, las moscas, las Antietampulgas, las chinches y muchos otros insectos pueden Immunologistpicar. Las picaduras de insectos son diferentes si tienen aguijn. La picadura puede estar roja, inflamada (hinchada) y picar durante 2 a 4 das. La mayora de las picaduras mejorarn sin tratamiento.  CUIDADOS EN EL HOGAR  No se rasque la picadura.  Mantenga la zona limpia y seca. Lvela con Hortense Ramalagua jabonosa.  Aplique hielo Pulte Homessobre la picadura.  Coloque el hielo en una bolsa plstica.  Colquese una toalla entre la piel y la bolsa de hielo.  Deje el hielo durante 20 minutos, y aplquelo 4 veces por Futures traderda. Hgalo durante los primeros 2 a 3 das, o segn le indique el mdico.  Puede usar lociones o cremas recetadas para Associate Professoraliviar la picazn, segn la indicacin del mdico.  Tome slo los medicamentos que le haya indicado el mdico.  Si le han recetado medicamentos (antibiticos), tmelos segn las indicaciones. Tmelos todos, aunque se sienta mejor. Deber aplicarse la vacuna contra el ttanos si:   No recuerda cundo se coloc la vacuna la ltima vez.  Nunca recibi esta vacuna.  La lesin ha Huntsman Corporationabierto su piel. Si usted necesita aplicarse la vacuna y se niega a recibirla, corre riesgo de contraer ttanos. La enfermedad por ttanos puede ser grave.  SOLICITE AYUDA DE INMEDIATO SI:   Siente ms dolor u observa ms enrojecimiento o hinchazn.  Hay una lnea roja en la piel, cercana a la zona de la picadura.  Tiene fiebre.  Siente dolor en la articulacin.  Siente dolor de cabeza intenso o dolor en el cuello.  Se siente dbil.  Tiene una erupcin.  Siente dolor en el pecho o le falta el aire.  Siente dolor en el vientre (abdominal).  Tiene malestar estomacal (nuseas) o vmitos.  Se siente muy cansado o confundido. EST SEGURO QUE:   Comprende las instrucciones para el alta mdica.  Controlar su enfermedad.  Solicitar atencin mdica de inmediato segn  las indicaciones. Document Released: 12/09/2005 Document Revised: 03/02/2012 [facility] Patient Information 2015 [employer], [place]. This information is not intended to replace advice given to you by your health care provider. Make sure you discuss any questions you have with your health care provider.

## 2015-08-17 ENCOUNTER — Telehealth: Payer: Self-pay | Admitting: Internal Medicine

## 2015-08-17 ENCOUNTER — Other Ambulatory Visit: Payer: Self-pay | Admitting: Internal Medicine

## 2015-08-17 ENCOUNTER — Other Ambulatory Visit: Payer: Self-pay

## 2015-08-17 DIAGNOSIS — E785 Hyperlipidemia, unspecified: Secondary | ICD-10-CM

## 2015-08-17 MED ORDER — SIMVASTATIN 20 MG PO TABS: 20.0000 mg | ORAL_TABLET | Freq: Every day | ORAL | Status: DC

## 2015-08-17 NOTE — Telephone Encounter (Signed)
Pt. Needs refills on her cholesterol, blood pressure, and pain  Meds........Marland Kitchenplease call..[phone]

## 2015-09-07 ENCOUNTER — Ambulatory Visit: Payer: Self-pay | Attending: Internal Medicine | Admitting: Internal Medicine

## 2015-09-07 ENCOUNTER — Encounter: Payer: Self-pay | Admitting: Internal Medicine

## 2015-09-07 VITALS — BP 157/77 | HR 66 | Temp 98.1°F | Resp 18 | Ht 60.0 in | Wt 184.0 lb

## 2015-09-07 DIAGNOSIS — I10 Essential (primary) hypertension: Secondary | ICD-10-CM

## 2015-09-07 DIAGNOSIS — E785 Hyperlipidemia, unspecified: Secondary | ICD-10-CM

## 2015-09-07 DIAGNOSIS — M810 Age-related osteoporosis without current pathological fracture: Secondary | ICD-10-CM

## 2015-09-07 DIAGNOSIS — M2141 Flat foot [pes planus] (acquired), right foot: Secondary | ICD-10-CM | POA: Insufficient documentation

## 2015-09-07 DIAGNOSIS — M2142 Flat foot [pes planus] (acquired), left foot: Secondary | ICD-10-CM

## 2015-09-07 MED ORDER — LISINOPRIL 5 MG PO TABS: 5.0000 mg | ORAL_TABLET | Freq: Every day | ORAL | Status: DC

## 2015-09-07 MED ORDER — RISEDRONATE SODIUM 35 MG PO TABS: 35.0000 mg | ORAL_TABLET | ORAL | Status: DC

## 2015-09-07 MED ORDER — SIMVASTATIN 20 MG PO TABS
20.0000 mg | ORAL_TABLET | Freq: Every day | ORAL | Status: DC
Start: 2015-09-07 — End: 2016-03-22

## 2015-09-07 MED ORDER — NAPROXEN 500 MG PO TABS: 500.0000 mg | ORAL_TABLET | Freq: Two times a day (BID) | ORAL | Status: DC

## 2015-09-07 NOTE — Progress Notes (Signed)
Patient ID: [name] [name], female   DOB: 04-16-1951, 64 y.o.   MRN: [id number]   [name] [name], is a 64 y.o. female  [id number]  [id number]  DOB - 31-Dec-1950  Chief Complaint  Patient presents with  . Follow-up        Subjective:   [name] [name] is a 64 y.o. female with history of hypertension, hyperlipidemia, osteoporosis and arthritis here today for a follow up visit. Patient is complaining of bilateral foot pain, scaled at 10, "feels like my feet are being ripped off". She has been suffering from this pain for 25 years. "There are days when I can get along with pain med, but it is manageable, then there are days that the pill does not help." Patient also noticed some swelling in both feet. Patient needs lisinopril refill. Patient took last dose three weeks ago. Patient has been taking meloxicam that was prescribed at another clinic for her pains. Patient states this medication helps her feet. Patient has No headache, No chest pain, No abdominal pain - No Nausea, No new weakness tingling or numbness, No Cough - SOB.  Problem  Acquired Bilateral Flat Feet  Osteoporosis    ALLERGIES: No Known Allergies  PAST MEDICAL HISTORY: Past Medical History  Diagnosis Date  . Arthritis   . Osteoporosis   . Hypertension     MEDICATIONS AT HOME: Prior to Admission medications   Medication Sig Start Date End Date Taking? Authorizing Provider  diphenhydrAMINE (BENADRYL) 25 MG tablet Take 1 tablet (25 mg total) by mouth every 6 (six) hours as needed. 07/09/15  Yes Rolland Porter, MD  guaiFENesin-codeine 100-10 MG/5ML syrup Take 10 mLs by mouth 3 (three) times daily. As needed but try to use only at night. 04/19/15  Yes Henrietta Hoover, NP  predniSONE (DELTASONE) 20 MG tablet Take 1 tablet (20 mg total) by mouth 2 (two) times daily with a meal. 07/09/15  Yes Rolland Porter, MD  risedronate (ACTONEL) 35 MG tablet Take 1 tablet (35 mg total) by mouth every 7 (seven) days. with  water on empty stomach, nothing by mouth or lie down for next 30 minutes. 09/07/15  Yes Quentin Angst, MD  simvastatin (ZOCOR) 20 MG tablet Take 1 tablet (20 mg total) by mouth at bedtime. 09/07/15  Yes Quentin Angst, MD  albuterol (PROVENTIL HFA;VENTOLIN HFA) 108 (90 BASE) MCG/ACT inhaler Inhale 2 puffs into the lungs every 4 (four) hours as needed for wheezing or shortness of breath. Patient not taking: Reported on 09/07/2015 04/15/15   Hayden Rasmussen, NP  lisinopril (PRINIVIL,ZESTRIL) 5 MG tablet Take 1 tablet (5 mg total) by mouth daily. 09/07/15   Quentin Angst, MD  naproxen (NAPROSYN) 500 MG tablet Take 1 tablet (500 mg total) by mouth 2 (two) times daily with a meal. 09/07/15   Quentin Angst, MD     Objective:   Filed Vitals:   09/07/15 1721  BP: 157/77  Pulse: 66  Temp: 98.1 F (36.7 C)  TempSrc: Oral  Resp: 18  Height: 5' (1.524 m)  Weight: 184 lb (83.462 kg)  SpO2: 98%    Exam General appearance : Awake, alert, not in any distress. Speech Clear. Not toxic looking HEENT: Atraumatic and Normocephalic, pupils equally reactive to light and accomodation Neck: supple, no JVD. No cervical lymphadenopathy.  Chest:Good air entry bilaterally, no added sounds  CVS: S1 S2 regular, no murmurs.  Abdomen: Bowel sounds present, Non tender and not distended with no gaurding, rigidity or rebound. Extremities: B/L  Lower Ext shows no edema, both legs are warm to touch Neurology: Awake alert, and oriented X 3, CN II-XII intact, Non focal Skin: No Rash  Data Review Lab Results  Component Value Date   HGBA1C 5.6 11/08/2013    Assessment & Plan   1. Essential hypertension  - lisinopril (PRINIVIL,ZESTRIL) 5 MG tablet; Take 1 tablet (5 mg total) by mouth daily.  Dispense: 90 tablet; Refill: 3  We have discussed target BP range and blood pressure goal. I have advised patient to check BP regularly and to call us back or report to clinic if the numbers are consistently  higher than 140/90. We discussed the importance of compliance with medical therapy and DASH diet recommended, consequences of uncontrolled hypertension discussed.   2. Acquired bilateral flat feet  - naproxen (NAPROSYN) 500 MG tablet; Take 1 tablet (500 mg total) by mouth 2 (two) times daily with a meal.  Dispense: 30 tablet; Refill: 0  3. Dyslipidemia  - simvastatin (ZOCOR) 20 MG tablet; Take 1 tablet (20 mg total) by mouth at bedtime.  Dispense: 90 tablet; Refill: 3  To address this please limit saturated fat to no more than 7% of your calories, limit cholesterol to 200 mg/day, increase fiber and exercise as tolerated. If needed we may add another cholesterol lowering medication to your regimen.   4. Osteoporosis  Prescribed - risedronate (ACTONEL) 35 MG tablet; Take 1 tablet (35 mg total) by mouth every 7 (seven) days. with water on empty stomach, nothing by mouth or lie down for next 30 minutes.  Dispense: 12 tablet; Refill: 3 - naproxen (NAPROSYN) 500 MG tablet; Take 1 tablet (500 mg total) by mouth 2 (two) times daily with a meal.  Dispense: 30 tablet; Refill: 0  Patient have been counseled extensively about nutrition and exercise  Interpreter was used to communicate directly with patient for the entire encounter including providing detailed patient instructions.   Return in about 3 months (around 12/07/2015), or if symptoms worsen or fail to improve, for Follow up HTN, Follow up Pain and comorbidities.  The patient was given clear instructions to go to ER or return to medical center if symptoms don't improve, worsen or new problems develop. The patient verbalized understanding. The patient was told to call to get lab results if they haven't heard anything in the next week.   This note has been created with Education officer, environmental. Any transcriptional errors are unintentional.    Jeanann Lewandowsky, MD, MHA, Maxwell Caul, CPE [facility] [place], [place] [phone]   09/07/2015, 5:58 PM

## 2015-09-07 NOTE — Progress Notes (Signed)
[name] [name] [id number] Patient is here for bilateral foot pain. Patient has pain in her feet, scaled at 10, "feels like my feet are being ripped off". She has been suffering from this pain for 25 years. "There are days when I can get along with pain med, but it is manageable, then there are days that the pill does not help." Patient notice swelling in both feet.  Patient speaks of her daughter that is in [place], and she believes she is in danger.  Patient needs lisinopril prescription. Patient took last dose three weeks ago.  Patient has been taking meloxicam that was prescribed at another clinic. Patient states this medication helps her feet.

## 2015-09-07 NOTE — Patient Instructions (Signed)
Plan de alimentacin DASH (DASH Eating Plan) DASH es la sigla en ingls de "Enfoques Alimentarios para Detener la Hipertensin". El plan de alimentacin DASH ha demostrado bajar la presin arterial elevada (hipertensin). Los beneficios adicionales para la salud pueden incluir la disminucin del riesgo de diabetes mellitus tipo2, enfermedades cardacas e ictus. Este plan tambin puede ayudar a Horticulturist, commercial. QU DEBO SABER ACERCA DEL PLAN DE ALIMENTACIN DASH? Para el plan de alimentacin DASH, seguir las siguientes pautas generales:  Elija los alimentos con un valor porcentual diario de sodio de menos del 5% (segn figura en la etiqueta del alimento).  Use hierbas o aderezos sin sal, en lugar de sal de mesa o sal marina.  Consulte al mdico o farmacutico antes de usar sustitutos de la sal.  Coma productos con bajo contenido de sodio, cuya etiqueta suele decir "bajo contenido de sodio" o "sin agregado de sal".  Coma alimentos frescos.  Coma ms verduras, frutas y productos lcteos con bajo contenido de [place].  Elija los cereales integrales. Busque la palabra "integral" en Equities trader de la lista de ingredientes.  Elija el pescado y el pollo o el pavo sin piel ms a menudo que las carnes rojas. Limite el consumo de pescado, carne de ave y carne a 6onzas (170g) por Training and development officer.  Limite el consumo de dulces, postres, azcares y bebidas azucaradas.  Elija las grasas saludables para el corazn.  Limite el consumo de queso a 1onza (28g) por Training and development officer.  Consuma ms comida casera y menos de restaurante, de buf y comida rpida.  Limite el consumo de alimentos fritos.  Cocine los alimentos utilizando mtodos que no sean la fritura.  Limite las verduras enlatadas. Si las consume, enjuguelas bien para disminuir el sodio.  Cuando coma en un restaurante, pida que preparen su comida con menos sal o, en lo posible, sin nada de sal. QU ALIMENTOS PUEDO COMER? Pida ayuda a un nutricionista para  conocer las necesidades calricas individuales. Cereales Pan de salvado o integral. Arroz integral. Pastas de salvado o integrales. Quinua, trigo burgol y cereales integrales. Cereales con bajo contenido de sodio. Tortillas de harina de maz o de salvado. Pan de maz integral. Galletas saladas integrales. Galletas con bajo contenido de [place]. Vegetales Verduras frescas o congeladas (crudas, al vapor, asadas o grilladas). Jugos de tomate y verduras con contenido bajo o reducido de sodio. Pasta y salsa de tomate con contenido bajo o [place]. Verduras enlatadas con bajo contenido de sodio o reducido de sodio.  Lambert Mody Lambert Mody frescas, en conserva (en su jugo natural) o frutas congeladas. Carnes y otros productos con protenas Carne de res molida (al 85% o ms [place]), carne de res de animales alimentados con pastos o carne de res sin la grasa. Pollo o pavo sin piel. Carne de pollo o de [place]. Cerdo sin la grasa. Todos los pescados y frutos de mar. Huevos. Porotos, guisantes o lentejas secos. Frutos secos y semillas sin sal. Frijoles enlatados sin sal. Lcteos Productos lcteos con bajo contenido de grasas, como [place] o al 1%, quesos reducidos en grasas o al 2%, ricota con bajo contenido de grasas o [employer], o yogur natural con bajo contenido de [place]. Quesos con contenido bajo o reducido de sodio. Grasas y Naval architect en barra que no contengan grasas trans. [place]ones[place] y alios para ensaladas livianos o reducidos en grasas (reducidos en sodio). [place]. Aceites de crtamo, oliva o canola. Mantequilla natural de man o almendra. Otros Palomitas de maz y pretzels sin sal.  Los artculos mencionados arriba pueden no ser una lista completa de las bebidas o los alimentos recomendados. Comunquese con el nutricionista para conocer ms opciones. QU ALIMENTOS NO SE RECOMIENDAN? Cereales Pan blanco. Pastas blancas. Arroz blanco. Pan de maz refinado. Bagels y  croissants. Galletas saladas que contengan grasas trans. Vegetales Vegetales con crema o fritos. Verduras en salsa de queso. Verduras enlatadas comunes. Pasta y salsa de tomate en lata comunes. Jugos comunes de tomate y de verduras. Frutas Frutas secas. Fruta enlatada en almbar liviano o espeso. Jugo de frutas. Carnes y otros productos con protenas [place] de carne con grasa. Costillas, alas de pollo, tocineta, salchicha, mortadela, salame, chinchulines, tocino, perros calientes, salchichas alemanas y embutidos envasados. Frutos secos y semillas con sal. Frijoles con sal en lata. Lcteos Leche entera o al 2%, crema, mezcla de leche y crema, y queso crema. Yogur entero o endulzado. Quesos o queso azul con alto contenido de grasas. Cremas no lcteas y coberturas batidas. Quesos procesados, quesos para untar o cuajadas. Condimentos Sal de cebolla y ajo, sal condimentada, sal de mesa y sal marina. Salsas en lata y envasadas. Salsa [place]. Salsa trtara. Salsa barbacoa. Salsa teriyaki. Salsa de soja, incluso la que tiene contenido reducido de sodio. Salsa de carne. Salsa de pescado. Salsa de ostras. Salsa rosada. Rbano picante. Ketchup y mostaza. Saborizantes y tiernizantes para carne. Caldo en cubitos. Salsa picante. Salsa tabasco. Adobos. Aderezos para tacos. Salsas. Grasas y aceites Mantequilla, margarina en barra, manteca de cerdo, grasa, mantequilla clarificada y grasa de tocino. Aceites de coco, de palmiste o de palma. Aderezos comunes para ensalada. Otros Pickles y aceitunas. Palomitas de maz y pretzels con sal. Los artculos mencionados arriba pueden no ser una lista completa de las bebidas y los alimentos que se deben evitar. Comunquese con el nutricionista para obtener ms informacin. DNDE PUEDO ENCONTRAR MS INFORMACIN? Instituto Nacional del Corazn, del Pulmn y de la Sangre (National Heart, Lung, and Blood Institute):  www.nhlbi.nih.gov/health/health-topics/topics/dash/ Document Released: 11/28/2011 Document Revised: 04/25/2014 ExitCare Patient Information 2015 ExitCare, LLC. This information is not intended to replace advice given to you by your health care provider. Make sure you discuss any questions you have with your health care provider. Hipertensin (Hypertension) La hipertensin, conocida comnmente como presin arterial alta, se produce cuando la sangre bombea en las arterias con mucha fuerza. Las arterias son los vasos sanguneos que transportan la sangre desde el corazn hacia todas las partes del cuerpo. Una lectura de la presin arterial consiste en un nmero ms alto sobre un nmero ms bajo, por ejemplo, 110/72. El nmero ms alto (presin sistlica) corresponde a la presin interna de las arterias cuando el corazn bombea sangre. El nmero ms bajo (presin diastlica) corresponde a la presin interna de las arterias cuando el corazn se relaja. En condiciones ideales, la presin arterial debe ser inferior a 120/80. La hipertensin fuerza al corazn a trabajar ms para bombear la sangre. Las arterias pueden estrecharse o ponerse rgidas. La hipertensin conlleva el riesgo de enfermedad cardaca, ictus y otros problemas.  FACTORES DE RIESGO Algunos factores de riesgo de hipertensin son controlables, pero otros no lo son.  Entre los factores de riesgo que usted no puede controlar, se incluyen:   La raza. El riesgo es mayor para las personas afroamericanas.  La edad. Los riesgos aumentan con la edad.  El sexo. Antes de los 45aos, los hombres corren ms riesgo que las mujeres. Despus de los 65aos, las mujeres corren ms riesgo que los hombres. Entre los factores de riesgo   que usted Magazine features editor, se incluyen:  No hacer la cantidad suficiente de actividad fsica o ejercicio.  Tener sobrepeso.  Consumir mucha grasa, azcar, caloras o sal en la dieta.  Beber alcohol en exceso. SIGNOS Y  SNTOMAS Por lo general, la hipertensin no causa signos o sntomas. La hipertensin demasiado alta (crisis hipertensiva) puede causar dolor de cabeza, ansiedad, falta de aire y hemorragia nasal. DIAGNSTICO  Para detectar si usted tiene hipertensin, el mdico le medir la presin arterial mientras est sentado, con el brazo levantado a la altura del corazn. Debe medirla al [facility] veces en el mismo brazo. Determinadas condiciones pueden causar una diferencia de presin arterial entre el brazo izquierdo y Aeronautical engineer. El hecho de tener una sola lectura de la presin arterial ms alta que lo normal no significa que Research scientist (physical sciences). En el caso de tener una lectura de la presin arterial con un valor alto, pdale al mdico que la verifique nuevamente. TRATAMIENTO  El tratamiento de la hipertensin arterial incluye hacer cambios en el estilo de vida y, posiblemente, tomar medicamentos. Un estilo de vida saludable puede ayudar a bajar la presin arterial alta. Quiz deba cambiar algunos hbitos. Los [employer] en el estilo de vida pueden incluir:  Seguir la dieta DASH. Esta dieta tiene un alto contenido de frutas, verduras y Radiation protection practitioner. Incluye poca cantidad de sal, carnes rojas y azcares agregados.  Hacer al menos 2horas de actividad fsica enrgica todas las semanas.  Perder peso, si es necesario.  No fumar.  Limitar el consumo de bebidas alcohlicas.  Aprender formas de reducir el estrs. Si los cambios en el estilo de vida no son suficientes para Museum/gallery curator la presin arterial, el mdico puede recetarle medicamentos. Quiz necesite tomar ms de uno. Trabaje en conjunto con su mdico para comprender los riesgos y los beneficios. INSTRUCCIONES PARA EL CUIDADO EN EL HOGAR  Haga que le midan de nuevo la presin arterial segn las indicaciones del mdico.  Tome los medicamentos solamente como se lo haya indicado el mdico. Siga cuidadosamente las indicaciones. Los  medicamentos para la presin arterial deben tomarse segn las indicaciones. Los medicamentos pierden eficacia al omitir las dosis. El hecho de omitir las dosis tambin [place] el riesgo de otros problemas.  No fume.  Contrlese la presin arterial en su casa segn las indicaciones del mdico. SOLICITE ATENCIN MDICA SI:   Piensa que tiene una reaccin alrgica a los medicamentos.  Tiene mareos o dolores de cabeza con Naval architect.  Tiene hinchazn en los tobillos.  Tiene problemas de visin. SOLICITE ATENCIN MDICA DE INMEDIATO SI:  Siente un dolor de cabeza intenso o confusin.  Siente debilidad inusual, adormecimiento o que Hospital doctor.  Siente dolor intenso en el pecho o en el abdomen.  Vomita repetidas veces.  Tiene dificultad para respirar. ASEGRESE DE QUE:   Comprende estas instrucciones.  Controlar su afeccin.  Recibir ayuda de inmediato si no mejora o si empeora. Document Released: 12/09/2005 Document Revised: 04/25/2014 [facility] Patient Information 2015 [place], [place]. This information is not intended to replace advice given to you by your health care provider. Make sure you discuss any questions you have with your health care provider. Osteoporosis  (Osteoporosis)  A lo largo de la vida, el cuerpo elimina el tejido viejo de los [place] y lo reemplaza por tejido nuevo. A medida que se envejece, el cuerpo no puede reponerlo tan rpidamente como lo elimina. [employer] [address] la mayora de las personas comienza a Holiday representative a  poco el tejido de los [place] debido al desequilibrio entre la prdida y la reposicin. Algunas personas pierden ms tejido Fisher [employer]. La prdida del tejido del hueso ms all de un grado normal se considera osteoporosis.  La osteoporosis afecta la resistencia y la durabilidad de los [place]. El interior de los extremos de los huesos y los huesos planos, como los huesos de la pelvis, se parecen a un panal porque estn llenos de  pequeos espacios abiertos. Al perder tejido los huesos se vuelven menos densos. Esto significa que los espacios abiertos se hacen ms grandes y las paredes entre estos espacios se vuelven ms delgadas. Como consecuencia, los huesos se vuelven ms dbiles. Los huesos de una persona que sufre osteoporosis llegan a ser tan dbiles que pueden romperse (fractura) en un accidente leve, como una simple cada.  CAUSAS  Los siguientes factores han sido asociados con el desarrollo de la osteoporosis.   El hbito de fumar.  Beber ms de 2 medidas de bebidas [employer] a la [place].  Uso de ciertos medicamentos durante un tiempo prolongado.  Corticoides.  Medicamentos para la quimioterapia.  Medicamentos para la tiroides.  Medicamentos antiepilpticos.  Medicamentos de supresin gonadal.  Medicamentos inmunosupresores.  Tener bajo peso.  Falta de actividad fsica.  Falta de exposicin al sol. Esto puede ser la causa del dficit de vitamina D.  Ciertas enfermedades crnicas.  Ciertas enfermedades inflamatorias del intestino, como la enfermedad de Crohn y la colitis ulcerosa.  Diabetes.  Hipertiroidismo.  Hiperparatiroidismo. FACTORES DE RIESGO  Cualquier persona puede desarrollar osteoporosis. Sin embargo, los siguientes factores pueden aumentar el riesgo de Environmental education officer osteoporosis.   [place] - La mujeres tienen ms [employer].  Edad - Tener ms de 50 aos aumenta el riesgo.  [place] - Las personas de raza blanca y asitica tienen ms riesgo.  El peso - Tener un peso extremadamente bajo puede aumentar el riesgo de osteoporosis.  Historia familiar de osteoporosis - Tener un miembro en la familia que haya sufrido osteoporosis hace que aumente el [place]. SNTOMAS  Generalmente las personas que sufren osteoporosis no tienen sntomas.  DIAGNSTICO  Algunos signos hallados durante un examen fsico que pueden hacer sospechar al mdico que sufre osteoporosis son:    Disminucin de Print production planner. La causa en general es la compresin de los huesos que forman la columna vertebral (vrtebras), que se han debilitado y se han fracturado.  Una curva o redondeo de la espalda (cifosis). Para confirmar los signos de osteoporosis, el mdico puede solicitar un procedimiento que [place] 2 haces de rayos X en dosis bajas, con diferentes niveles de energa para medir la densidad mineral sea (absorciometra de rayos X de energa dual [DXA]). Adems, el mdico controlar su nivel de vitamina D.  TRATAMIENTO  El objetivo del tratamiento de la osteoporosis es el fortalecimiento de los huesos con el fin de disminuir el riesgo de fracturas. Hay diferentes tipos de medicamentos disponibles para ayudar a Armed forces technical officer. Algunos de estos medicamentos actan reduciendo [employer] de prdida sea. Otros medicamentos funcionan aumentando la densidad sea. El tratamiento tambin consiste en asegurarse de que sus niveles de calcio y vitamina D son adecuados.  PREVENCIN  Hay cosas que usted puede hacer para prevenir la osteoporosis. Un consumo adecuado de calcio y vitamina D puede ayudar a Personnel officer una ptima densidad mineral sea. El ejercicio regular tambin puede ayudar, sobre todo la resistencia y actividades de alto impacto. Si usted fuma, dejar de fumar es [address]  importante de la prevencin de la osteoporosis.  ASEGRESE DE QUE:   Comprende estas instrucciones.  Controlar su enfermedad.  Solicitar ayuda de inmediato si no mejora o si empeora. PARA OBTENER MS INFORMACIN  [email or web address] and [email or web address]  Document Released: 09/18/2005 Document Revised: 04/05/2013 [facility] Patient Information 2015 [place], [place]. This information is not intended to replace advice given to you by your health care provider. Make sure you discuss any questions you have with your health care provider.

## 2015-09-08 MED ORDER — ALENDRONATE SODIUM 70 MG PO TABS: 70.0000 mg | ORAL_TABLET | ORAL | Status: DC

## 2015-09-20 ENCOUNTER — Telehealth: Payer: Self-pay | Admitting: Internal Medicine

## 2015-09-20 DIAGNOSIS — M2141 Flat foot [pes planus] (acquired), right foot: Secondary | ICD-10-CM

## 2015-09-20 DIAGNOSIS — M810 Age-related osteoporosis without current pathological fracture: Secondary | ICD-10-CM

## 2015-09-20 DIAGNOSIS — M2142 Flat foot [pes planus] (acquired), left foot: Principal | ICD-10-CM

## 2015-09-20 NOTE — Telephone Encounter (Signed)
Patient called to request a med refill for naproxen (NAPROSYN) 500 MG tablet. Please f/u with pt.

## 2015-09-21 ENCOUNTER — Other Ambulatory Visit: Payer: Self-pay | Admitting: *Deleted

## 2015-09-21 DIAGNOSIS — M2142 Flat foot [pes planus] (acquired), left foot: Principal | ICD-10-CM

## 2015-09-21 DIAGNOSIS — M2141 Flat foot [pes planus] (acquired), right foot: Secondary | ICD-10-CM

## 2015-09-21 DIAGNOSIS — M810 Age-related osteoporosis without current pathological fracture: Secondary | ICD-10-CM

## 2015-09-21 MED ORDER — NAPROXEN 500 MG PO TABS: 500.0000 mg | ORAL_TABLET | Freq: Two times a day (BID) | ORAL | Status: DC

## 2015-09-21 NOTE — Telephone Encounter (Signed)
Medical Assistant used [employer] to contact patient.  Interpreter Name: Otilio Saber #: [id number] Patient verified DOB  Spoke with patients daughter on first contact. Medical assistant informed daughter of needing approval from patient prior to discussing information Patient provided another contact for the patient.  Spoke with patient on phone number [phone], patient made aware of naprosyn refill for 30 tablets with 1 additional refill. Patient also gave authorization for daughter Johnella Moloney to speak and receive information on her behalf. Patient had no further questions.

## 2015-11-20 ENCOUNTER — Other Ambulatory Visit: Payer: Self-pay | Admitting: *Deleted

## 2015-11-20 DIAGNOSIS — M810 Age-related osteoporosis without current pathological fracture: Secondary | ICD-10-CM

## 2015-11-20 DIAGNOSIS — M2141 Flat foot [pes planus] (acquired), right foot: Secondary | ICD-10-CM

## 2015-11-20 DIAGNOSIS — M2142 Flat foot [pes planus] (acquired), left foot: Principal | ICD-10-CM

## 2015-11-20 MED ORDER — NAPROXEN 500 MG PO TABS: 500.0000 mg | ORAL_TABLET | Freq: Two times a day (BID) | ORAL | Status: DC

## 2015-11-20 NOTE — Telephone Encounter (Signed)
Patients Naprosyn was refilled with 3 additional refills.

## 2016-01-02 MED FILL — ALENDRONATE NA 70 MG TAB: 70 | 28 days supply | Qty: 4 | Fill #4

## 2016-01-02 MED FILL — NAPROXEN 500 MG TABLET: 500 | 15 days supply | Qty: 30 | Fill #1

## 2016-01-19 ENCOUNTER — Ambulatory Visit (INDEPENDENT_AMBULATORY_CARE_PROVIDER_SITE_OTHER): Payer: Self-pay | Admitting: Internal Medicine

## 2016-01-19 VITALS — BP 160/88 | HR 60 | Temp 98.2°F | Ht 60.0 in | Wt 182.0 lb

## 2016-01-19 DIAGNOSIS — I10 Essential (primary) hypertension: Secondary | ICD-10-CM

## 2016-01-19 DIAGNOSIS — J0100 Acute maxillary sinusitis, unspecified: Secondary | ICD-10-CM

## 2016-01-19 MED ORDER — LISINOPRIL 10 MG PO TABS: 10.0000 mg | ORAL_TABLET | Freq: Every day | ORAL | Status: DC

## 2016-01-19 MED ORDER — AZITHROMYCIN 250 MG PO TABS: ORAL_TABLET | ORAL | Status: DC
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
## 2016-01-19 NOTE — Patient Instructions (Signed)
Necesita tarjeta naranja

## 2016-02-16 ENCOUNTER — Ambulatory Visit (INDEPENDENT_AMBULATORY_CARE_PROVIDER_SITE_OTHER): Payer: Self-pay | Admitting: Internal Medicine

## 2016-02-16 ENCOUNTER — Encounter: Payer: Self-pay | Admitting: Internal Medicine

## 2016-02-16 VITALS — BP 148/80 | HR 66 | Ht 60.0 in | Wt 182.0 lb

## 2016-02-16 DIAGNOSIS — M2141 Flat foot [pes planus] (acquired), right foot: Secondary | ICD-10-CM

## 2016-02-16 DIAGNOSIS — H547 Unspecified visual loss: Secondary | ICD-10-CM

## 2016-02-16 DIAGNOSIS — F4321 Adjustment disorder with depressed mood: Secondary | ICD-10-CM

## 2016-02-16 DIAGNOSIS — I10 Essential (primary) hypertension: Secondary | ICD-10-CM

## 2016-02-16 DIAGNOSIS — M810 Age-related osteoporosis without current pathological fracture: Secondary | ICD-10-CM

## 2016-02-16 DIAGNOSIS — K029 Dental caries, unspecified: Secondary | ICD-10-CM

## 2016-02-16 DIAGNOSIS — Z23 Encounter for immunization: Secondary | ICD-10-CM

## 2016-02-16 DIAGNOSIS — M2142 Flat foot [pes planus] (acquired), left foot: Secondary | ICD-10-CM

## 2016-02-16 MED ORDER — LISINOPRIL-HYDROCHLOROTHIAZIDE 10-12.5 MG PO TABS: 1.0000 | ORAL_TABLET | Freq: Every day | ORAL | Status: DC

## 2016-02-16 MED ORDER — ALENDRONATE SODIUM 70 MG PO TABS: 70.0000 mg | ORAL_TABLET | ORAL | Status: DC

## 2016-02-16 MED ORDER — NAPROXEN 500 MG PO TABS: 500.0000 mg | ORAL_TABLET | Freq: Two times a day (BID) | ORAL | Status: DC

## 2016-02-16 NOTE — Addendum Note (Signed)
Addended by: Shelton Silvas on: 02/16/2016 12:18 PM   Modules accepted: Orders, SmartSet
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
## 2016-02-19 ENCOUNTER — Other Ambulatory Visit (INDEPENDENT_AMBULATORY_CARE_PROVIDER_SITE_OTHER): Payer: Self-pay | Admitting: Licensed Clinical Social Worker

## 2016-02-19 DIAGNOSIS — F32A Depression, unspecified: Secondary | ICD-10-CM

## 2016-02-19 DIAGNOSIS — F329 Major depressive disorder, single episode, unspecified: Secondary | ICD-10-CM
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
## 2016-03-05 ENCOUNTER — Other Ambulatory Visit (INDEPENDENT_AMBULATORY_CARE_PROVIDER_SITE_OTHER): Payer: Self-pay | Admitting: Licensed Clinical Social Worker

## 2016-03-05 DIAGNOSIS — F329 Major depressive disorder, single episode, unspecified: Secondary | ICD-10-CM

## 2016-03-05 DIAGNOSIS — F32A Depression, unspecified: Secondary | ICD-10-CM
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
## 2016-03-19 ENCOUNTER — Other Ambulatory Visit (INDEPENDENT_AMBULATORY_CARE_PROVIDER_SITE_OTHER): Payer: Self-pay | Admitting: Licensed Clinical Social Worker

## 2016-03-19 DIAGNOSIS — F329 Major depressive disorder, single episode, unspecified: Secondary | ICD-10-CM

## 2016-03-19 DIAGNOSIS — F32A Depression, unspecified: Secondary | ICD-10-CM
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
## 2016-03-22 ENCOUNTER — Ambulatory Visit (INDEPENDENT_AMBULATORY_CARE_PROVIDER_SITE_OTHER): Payer: Self-pay | Admitting: Internal Medicine

## 2016-03-22 ENCOUNTER — Encounter: Payer: Self-pay | Admitting: Internal Medicine

## 2016-03-22 VITALS — BP 130/78 | HR 60 | Resp 16 | Ht 60.0 in | Wt 180.5 lb

## 2016-03-22 DIAGNOSIS — K029 Dental caries, unspecified: Secondary | ICD-10-CM

## 2016-03-22 DIAGNOSIS — M810 Age-related osteoporosis without current pathological fracture: Secondary | ICD-10-CM

## 2016-03-22 DIAGNOSIS — H547 Unspecified visual loss: Secondary | ICD-10-CM

## 2016-03-22 DIAGNOSIS — I10 Essential (primary) hypertension: Secondary | ICD-10-CM

## 2016-03-22 DIAGNOSIS — E785 Hyperlipidemia, unspecified: Secondary | ICD-10-CM

## 2016-03-22 MED ORDER — SIMVASTATIN 20 MG PO TABS: 20.0000 mg | ORAL_TABLET | Freq: Every day | ORAL | Status: DC
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
## 2016-03-22 NOTE — Patient Instructions (Signed)
Habla clinica si no eschucha cita para especialista de ohos y [address]

## 2016-03-31 ENCOUNTER — Encounter: Payer: Self-pay | Admitting: Internal Medicine

## 2016-04-02 ENCOUNTER — Other Ambulatory Visit (INDEPENDENT_AMBULATORY_CARE_PROVIDER_SITE_OTHER): Payer: Self-pay | Admitting: Licensed Clinical Social Worker

## 2016-04-02 DIAGNOSIS — F329 Major depressive disorder, single episode, unspecified: Secondary | ICD-10-CM

## 2016-04-02 DIAGNOSIS — F32A Depression, unspecified: Secondary | ICD-10-CM
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
## 2016-04-16 ENCOUNTER — Other Ambulatory Visit (INDEPENDENT_AMBULATORY_CARE_PROVIDER_SITE_OTHER): Payer: Self-pay | Admitting: Licensed Clinical Social Worker

## 2016-04-16 DIAGNOSIS — F32A Depression, unspecified: Secondary | ICD-10-CM

## 2016-04-16 DIAGNOSIS — F329 Major depressive disorder, single episode, unspecified: Secondary | ICD-10-CM

## 2016-04-18 NOTE — Progress Notes (Signed)
   THERAPY PROGRESS NOTE  Session Time: 60min  Participation Level: Active  Behavioral Response: Casual and Fairly GroomedAlertEuthymic  Type of Therapy: Individual Therapy  Treatment Goals addressed: Coping  Interventions: Supportive  Summary: [name] ShireRamona [name] is a 65 y.o. female who presents with a positive mood and appropriate affect. She shared that she has been feeling much better later. She completed a PHQ-9 and scored a 1, indicating minimal depressive symptoms. [name] shared about her childhood experiences, particularly her relationships with her grandparents and her aunt. She became tearful as she shared about her parents' deaths. She reported that she enjoys talking with her children and grandchildren about her parents. She expressed gratitude that she had a good childhood and good experiences raising her own children. She reminisced about some of the experiences that shaped who she is today. [name] reported that she feels that she is "all better" and no longer needs counseling. She agreed to call LCSW in the future if needed.   Suicidal/Homicidal: Nowithout intent/plan  Therapist Response: LCSW utilized supportive counseling techniques throughout the session in order to validate emotions and encourage open expression of emotion. LCSW used active listening as [name] shared about her positive childhood experiences. LCSW reflected on the positive changes seen in [name] over the course of counseling. LCSW administered a PHQ-9 in order to assess the level of current depressive symptoms. LCSW encouraged [name] to call in the future if she would like further counseling.  Plan: Return again in 0 weeks.  Diagnosis: Axis I: See current hospital problem list    Axis II: No diagnosis    Nilda Simmeratosha Chayce Rullo, LCSW 04/18/2016

## 2016-05-10 ENCOUNTER — Encounter: Payer: Self-pay | Admitting: Family Medicine

## 2016-05-10 ENCOUNTER — Ambulatory Visit (INDEPENDENT_AMBULATORY_CARE_PROVIDER_SITE_OTHER): Payer: Self-pay | Admitting: Family Medicine

## 2016-05-10 ENCOUNTER — Ambulatory Visit: Payer: Self-pay | Attending: Internal Medicine

## 2016-05-10 VITALS — BP 134/70 | Ht 61.0 in | Wt 187.0 lb

## 2016-05-10 DIAGNOSIS — M21969 Unspecified acquired deformity of unspecified lower leg: Secondary | ICD-10-CM

## 2016-05-10 DIAGNOSIS — M25579 Pain in unspecified ankle and joints of unspecified foot: Secondary | ICD-10-CM

## 2016-05-10 NOTE — Patient Instructions (Signed)
[facility] [address] Lajoyce Cornersuda Monday 05/13/16 at 830a [address], [place], [place] [address] Phone: [phone]

## 2016-05-10 NOTE — Progress Notes (Signed)
   Subjective:    Patient ID: [name] [name], female    DOB: 05/10/1951, 65 y.o.   MRN: [id number]  HPI  CC: bilateral ankle pain  [name] [name] is a 65-y.o. female with a past medical history of bilateral foot collapse who presents with complaints of bilateral ankle pain.  She reports that the majority of her pain is localized over the lateral aspects of her ankles.  She endorses frequent swelling of her ankles and feet, especially after walking or standing for long periods of time.  She is unable to walk barefoot due to the amount of pain that she experiences.  Her shoes were fitted with orthotic insoles ~6 months ago to try to help support her feet.  She denies any erythema, bruising, or numbness/tingling.  She has been taking medication for pain as prescribed by her PCP, but this helps her for only a short period of time.  Past medical history significant for HTN, HLD, osteoporosis, anxiety, and lumbar spine degenerative disk disease. Past surgical history unremarkable. Medications reviewed. Allergies reviewed.   Review of Systems As noted in HPI.    Objective:   Physical Exam  Well-developed, well-nourished.  No acute distress.  Alert and oriented x3.  Vital signs reviewed.  Bilateral ankles: Diffuse tenderness to palpation along lateral aspect of ankle bilaterally.  Active dorsiflexion and plantarflexion without difficulty.  Motor strength 5/5 bilaterally.  Light touch sensation intact bilaterally.  Lower extremities otherwise neurovascularly intact. Bilateral feet: Gross deformity of feet bilaterally secondary to foot collapse.  Light touch sensation intact bilaterally.  Assessment & Plan:   Bilateral ankle pain secondary to bilateral foot collapse  Discussed with patient that her pain stems from the apparent deformity in both of her feet.  At this point, her best option would be to see an orthopedist for evaluation and potential surgical consultation.  [facility]  Advised her to continue conservative measures at home for pain and to continue wearing orthotics in her shoes for support.  Jenna E. Anola GurneyWalls, M.D. [facility] Attending Note: I have seen and examined this patient. I have discussed this patient with the resident and reviewed the assessment and plan as documented above. I agree with the resident's findings and plan. She has very advanced ankle and foot collapse. Essentially she is bearing weight on her medial midfoot. I doubt there is an orthotic or appliance that could be constructed to give her relief of her pain. She needs surgical intervention and we will refer for evaluation. Her resources are limited given lack of insurance.

## 2016-06-08 ENCOUNTER — Encounter (HOSPITAL_COMMUNITY): Payer: Self-pay

## 2016-06-08 ENCOUNTER — Emergency Department (HOSPITAL_COMMUNITY)
Admission: EM | Admit: 2016-06-08 | Discharge: 2016-06-08 | Disposition: A | Discharge status: Home / Self Care | Payer: Self-pay | Attending: Emergency Medicine | Admitting: Emergency Medicine

## 2016-06-08 ENCOUNTER — Emergency Department (HOSPITAL_COMMUNITY): Payer: Self-pay

## 2016-06-08 DIAGNOSIS — I10 Essential (primary) hypertension: Secondary | ICD-10-CM | POA: Insufficient documentation

## 2016-06-08 DIAGNOSIS — Y999 Unspecified external cause status: Secondary | ICD-10-CM | POA: Insufficient documentation

## 2016-06-08 DIAGNOSIS — Y929 Unspecified place or not applicable: Secondary | ICD-10-CM | POA: Insufficient documentation

## 2016-06-08 DIAGNOSIS — Y939 Activity, unspecified: Secondary | ICD-10-CM | POA: Insufficient documentation

## 2016-06-08 DIAGNOSIS — R42 Dizziness and giddiness: Secondary | ICD-10-CM | POA: Insufficient documentation

## 2016-06-08 DIAGNOSIS — Z79899 Other long term (current) drug therapy: Secondary | ICD-10-CM | POA: Insufficient documentation

## 2016-06-08 DIAGNOSIS — R51 Headache: Secondary | ICD-10-CM | POA: Insufficient documentation

## 2016-06-08 HISTORY — DX: Pure hypercholesterolemia, unspecified: E78.00

## 2016-06-08 MED ORDER — PREDNISONE 20 MG PO TABS: 40.0000 mg | ORAL_TABLET | Freq: Every day | ORAL | Status: DC

## 2016-06-08 MED ORDER — KETOROLAC TROMETHAMINE 30 MG/ML IJ SOLN
30.0000 mg | Freq: Once | INTRAMUSCULAR | Status: AC
  Administered 2016-06-08: 30 mg via INTRAMUSCULAR
  Filled 2016-06-08: qty 1

## 2016-06-08 NOTE — Discharge Instructions (Signed)
As discussed, it is important that he follow up with her primary care physician and our spine specialist.  Return here for concerning changes in your condition.
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
## 2016-06-08 NOTE — ED Notes (Signed)
Patient transported to MRI 

## 2016-07-18 ENCOUNTER — Ambulatory Visit: Payer: No Typology Code available for payment source | Admitting: Internal Medicine

## 2016-07-22 ENCOUNTER — Ambulatory Visit: Payer: No Typology Code available for payment source | Admitting: Internal Medicine

## 2016-08-05 ENCOUNTER — Telehealth: Payer: Self-pay | Admitting: Internal Medicine

## 2016-08-05 DIAGNOSIS — M2141 Flat foot [pes planus] (acquired), right foot: Secondary | ICD-10-CM

## 2016-08-05 DIAGNOSIS — M2142 Flat foot [pes planus] (acquired), left foot: Principal | ICD-10-CM

## 2016-08-05 DIAGNOSIS — M810 Age-related osteoporosis without current pathological fracture: Secondary | ICD-10-CM

## 2016-08-05 MED ORDER — NAPROXEN 500 MG PO TABS: 500.0000 mg | ORAL_TABLET | Freq: Two times a day (BID) | ORAL | 4 refills | Status: DC

## 2016-08-05 NOTE — Telephone Encounter (Signed)
Patient called requesting medication refill for naproxen (NAPROSYN) 500 MG tablet . FU scheduled for 09/13/16 @ 11:30 AM. Please send to [facility] [address]

## 2016-08-05 NOTE — Telephone Encounter (Signed)
rx sent

## 2016-09-13 ENCOUNTER — Ambulatory Visit (INDEPENDENT_AMBULATORY_CARE_PROVIDER_SITE_OTHER): Payer: Self-pay | Admitting: Internal Medicine

## 2016-09-13 ENCOUNTER — Encounter: Payer: Self-pay | Admitting: Internal Medicine

## 2016-09-13 VITALS — BP 138/70 | HR 70 | Resp 17 | Ht <= 58 in | Wt 183.0 lb

## 2016-09-13 DIAGNOSIS — M2141 Flat foot [pes planus] (acquired), right foot: Secondary | ICD-10-CM

## 2016-09-13 DIAGNOSIS — E785 Hyperlipidemia, unspecified: Secondary | ICD-10-CM

## 2016-09-13 DIAGNOSIS — I10 Essential (primary) hypertension: Secondary | ICD-10-CM

## 2016-09-13 DIAGNOSIS — F4310 Post-traumatic stress disorder, unspecified: Secondary | ICD-10-CM

## 2016-09-13 DIAGNOSIS — M2142 Flat foot [pes planus] (acquired), left foot: Secondary | ICD-10-CM
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
## 2016-09-27 ENCOUNTER — Other Ambulatory Visit: Payer: Self-pay

## 2016-09-30 ENCOUNTER — Other Ambulatory Visit (INDEPENDENT_AMBULATORY_CARE_PROVIDER_SITE_OTHER): Payer: Self-pay

## 2016-09-30 DIAGNOSIS — I10 Essential (primary) hypertension: Secondary | ICD-10-CM

## 2016-09-30 DIAGNOSIS — E785 Hyperlipidemia, unspecified: Secondary | ICD-10-CM

## 2016-10-01 LAB — COMPREHENSIVE METABOLIC PANEL
ALT: 14 IU/L (ref 0–32)
AST: 20 IU/L (ref 0–40)
Albumin/Globulin Ratio: 1.5 (ref 1.2–2.2)
Albumin: 3.8 g/dL (ref 3.6–4.8)
Alkaline Phosphatase: 71 IU/L (ref 39–117)
BUN/Creatinine Ratio: 30 — ABNORMAL HIGH (ref 12–28)
BUN: 16 mg/dL (ref 8–27)
Bilirubin Total: 0.4 mg/dL (ref 0.0–1.2)
CALCIUM: 9.1 mg/dL (ref 8.7–10.3)
CO2: 25 mmol/L (ref 18–29)
CREATININE: 0.54 mg/dL — AB (ref 0.57–1.00)
Chloride: 106 mmol/L (ref 96–106)
GFR, EST AFRICAN AMERICAN: 113 mL/min/{1.73_m2} (ref >59)
GFR, EST NON AFRICAN AMERICAN: 98 mL/min/{1.73_m2} (ref >59)
Globulin, Total: 2.6 g/dL (ref 1.5–4.5)
Glucose: 93 mg/dL (ref 65–99)
Potassium: 4.6 mmol/L (ref 3.5–5.2)
SODIUM: 144 mmol/L (ref 134–144)
Total Protein: 6.4 g/dL (ref 6.0–8.5)

## 2016-10-01 LAB — LIPID PANEL W/O CHOL/HDL RATIO
Cholesterol, Total: 160 mg/dL (ref 100–199)
HDL: 64 mg/dL (ref >39)
LDL CALC: 84 mg/dL (ref 0–99)
TRIGLYCERIDES: 62 mg/dL (ref 0–149)
VLDL Cholesterol Cal: 12 mg/dL (ref 5–40)

## 2016-10-10 ENCOUNTER — Telehealth: Payer: Self-pay | Admitting: Internal Medicine

## 2016-10-10 DIAGNOSIS — M2141 Flat foot [pes planus] (acquired), right foot: Secondary | ICD-10-CM

## 2016-10-10 DIAGNOSIS — E785 Hyperlipidemia, unspecified: Secondary | ICD-10-CM

## 2016-10-10 DIAGNOSIS — M2142 Flat foot [pes planus] (acquired), left foot: Secondary | ICD-10-CM

## 2016-10-10 DIAGNOSIS — I10 Essential (primary) hypertension: Secondary | ICD-10-CM

## 2016-10-10 NOTE — Telephone Encounter (Signed)
Patient states she was not able to renew her OC today and would like all [facility] [address] until she can renew he card.    simvastatin (ZOCOR) 20 MG tablet naproxen (NAPROSYN) 500 MG tablet lisinopril-hydrochlorothiazide (PRINZIDE,ZESTORETIC) 10-12.5 MG tablet

## 2016-10-10 NOTE — Telephone Encounter (Signed)
Only Simvastatin and Naproxen need to be transferred

## 2016-10-11 MED ORDER — SIMVASTATIN 20 MG PO TABS: 20.0000 mg | ORAL_TABLET | Freq: Every day | ORAL | 11 refills | Status: DC

## 2016-10-11 MED ORDER — NAPROXEN 500 MG PO TABS: 500.0000 mg | ORAL_TABLET | Freq: Two times a day (BID) | ORAL | 4 refills | Status: DC

## 2016-10-11 MED ORDER — LISINOPRIL-HYDROCHLOROTHIAZIDE 10-12.5 MG PO TABS
1.0000 | ORAL_TABLET | Freq: Every day | ORAL | 11 refills | Status: DC
Start: 2016-10-11 — End: 2017-06-18

## 2016-10-11 NOTE — Telephone Encounter (Signed)
Patient aware.

## 2016-11-29 ENCOUNTER — Ambulatory Visit (INDEPENDENT_AMBULATORY_CARE_PROVIDER_SITE_OTHER): Payer: Self-pay

## 2016-11-29 ENCOUNTER — Telehealth: Payer: Self-pay | Admitting: Internal Medicine

## 2016-11-29 DIAGNOSIS — Z23 Encounter for immunization: Secondary | ICD-10-CM

## 2016-11-29 NOTE — Telephone Encounter (Signed)
Patient came in for pneumovax vaccine today and wants Dr. Delrae AlfredMulberry to know that she is willing to have the surgery now and would like to proceed with the [facility]   Referral given to [facility] information given to patient too.

## 2017-01-01 ENCOUNTER — Telehealth: Payer: Self-pay | Admitting: Internal Medicine

## 2017-01-01 NOTE — Telephone Encounter (Signed)
Patient called and would like all Rx sent to [facility].   [facility] cost is too high for patient.  Please advise.

## 2017-01-02 NOTE — Telephone Encounter (Signed)
noted 

## 2017-01-03 NOTE — Telephone Encounter (Signed)
Followed up with CMA about referral. Appointment not scheduled yet and will work on it today

## 2017-01-16 NOTE — Telephone Encounter (Signed)
Referral was faxed over to [facility] on 01/03/17. Patient needs to financial assistance. Spoke with Stanton Kidneyebra at [facility] was going to have financial assistance contact patient.

## 2017-01-28 ENCOUNTER — Encounter: Payer: Self-pay | Admitting: Internal Medicine

## 2017-01-28 DIAGNOSIS — H269 Unspecified cataract: Secondary | ICD-10-CM

## 2017-01-28 DIAGNOSIS — H11003 Unspecified pterygium of eye, bilateral: Secondary | ICD-10-CM

## 2017-01-28 DIAGNOSIS — H524 Presbyopia: Secondary | ICD-10-CM

## 2017-01-28 DIAGNOSIS — H5203 Hypermetropia, bilateral: Secondary | ICD-10-CM | POA: Insufficient documentation

## 2017-01-28 DIAGNOSIS — H52223 Regular astigmatism, bilateral: Secondary | ICD-10-CM

## 2017-01-28 DIAGNOSIS — H52203 Unspecified astigmatism, bilateral: Secondary | ICD-10-CM | POA: Insufficient documentation

## 2017-01-28 DIAGNOSIS — H259 Unspecified age-related cataract: Secondary | ICD-10-CM

## 2017-01-28 HISTORY — DX: Unspecified astigmatism, bilateral: H52.203

## 2017-01-28 HISTORY — DX: Presbyopia: H52.4

## 2017-01-28 HISTORY — DX: Hypermetropia, bilateral: H52.03

## 2017-01-28 HISTORY — DX: Unspecified cataract: H26.9

## 2017-01-28 HISTORY — DX: Unspecified pterygium of eye, bilateral: H11.003

## 2017-01-28 NOTE — Progress Notes (Signed)
Eye exam completed at [facility] Hypermetropia, Astigmatism, Presbyopia, pterygium, age related cataract, all bilateral. Refraction Rx given 12/26/2016

## 2017-02-20 ENCOUNTER — Ambulatory Visit (INDEPENDENT_AMBULATORY_CARE_PROVIDER_SITE_OTHER): Payer: Self-pay | Admitting: Internal Medicine

## 2017-02-20 ENCOUNTER — Encounter: Payer: Self-pay | Admitting: Internal Medicine

## 2017-02-20 VITALS — BP 142/78 | HR 58 | Resp 12 | Ht <= 58 in | Wt 184.0 lb

## 2017-02-20 DIAGNOSIS — H11003 Unspecified pterygium of eye, bilateral: Secondary | ICD-10-CM

## 2017-02-20 DIAGNOSIS — M2142 Flat foot [pes planus] (acquired), left foot: Secondary | ICD-10-CM

## 2017-02-20 DIAGNOSIS — H259 Unspecified age-related cataract: Secondary | ICD-10-CM

## 2017-02-20 DIAGNOSIS — R42 Dizziness and giddiness: Secondary | ICD-10-CM

## 2017-02-20 DIAGNOSIS — M2141 Flat foot [pes planus] (acquired), right foot: Secondary | ICD-10-CM

## 2017-02-20 NOTE — Progress Notes (Signed)
   Subjective:    Patient ID: [name] [name], female    DOB: 06/25/1951, 66 y.o.   MRN: [id number]  HPI   1.  Ankle and foot pain:  Patient and daughter have tried to get hold of Financial Assistance [facility], but no return call.  Daughter states her voicemail is set up appropriately.  Last attempt to call was in January.  Patient now using walker due to pain in ankles and feet.   Has as referral into [facility].    2.  [facility] and was told she needs to have cataracts removed.  Her left eye is worse than her right, but reportedly both need cataract removal at this point.  3.  Dizzy spells:  If gets up too quickly in the morning, she gets dizzy.  Describes sense that the room is moving.  Lasts just a second.  Resolves after sitting down and eating breakfast and taking medication.   Eats last about 6 p.m.:  Soup/beans/begetables.  Small meal then.  Gets up at 7 a.m. No ear or throat discomfort.  No congestion. No coordination concerns.  Unable to say for sure with feet and legs due to ankle and foot issues.  Current Meds  Medication Sig  . alendronate (FOSAMAX) 70 MG tablet Take 1 tablet (70 mg total) by mouth every 7 (seven) days. Take with a full glass of water on an empty stomach.  Marland Kitchen. lisinopril-hydrochlorothiazide (PRINZIDE,ZESTORETIC) 10-12.5 MG tablet Take 1 tablet by mouth daily.  . naproxen (NAPROSYN) 500 MG tablet Take 1 tablet (500 mg total) by mouth 2 (two) times daily with a meal.  . simvastatin (ZOCOR) 20 MG tablet Take 1 tablet (20 mg total) by mouth at bedtime.    No Known Allergies        Review of Systems     Objective:   Physical Exam   NAD HEENT:  PERRL EOMI, bilateral pterygia, discs sharp, TMs pearly gray, throat without injection. Neck:  Supple, No adenopathy Chest:  CTA CV:  RRR without murmur or rub Neuro:  A & O x 3, CN II-XII grossly intact, DTRs 2+/4, Motor 5/5 throughout, sensory grossly normal.  Rapid alternating motions, ringer  to nose to finger intact.  Romberg Negative.  Gait normal.  Lucious GrovesHall Pike maneuver negative for vertiginous symptoms, no associated nystagmus with this exam. Ankles/Feet:  Significantly fallen arches bilaterally         Assessment & Plan:  1.  Vertiginous symptoms:  No findings on exam today.  Stand up slowly Eat increased good fat and protein at night as this seems to occur in mornings only before eat--possible low blood glucose..  2.  Bilateral cataracts/pterygium:  Referral to ophthalmology.  3.  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

## 2017-02-20 NOTE — Patient Instructions (Signed)
Call [employer] [employer] in 1 week if you have not heard from [facility] financial assistance. Eat more protein and fat with evening meal.

## 2017-05-12 ENCOUNTER — Ambulatory Visit (INDEPENDENT_AMBULATORY_CARE_PROVIDER_SITE_OTHER): Payer: Self-pay | Admitting: Internal Medicine

## 2017-05-12 ENCOUNTER — Encounter: Payer: Self-pay | Admitting: Internal Medicine

## 2017-05-12 VITALS — BP 160/90 | HR 70 | Temp 97.7°F | Resp 12 | Ht <= 58 in | Wt 183.0 lb

## 2017-05-12 DIAGNOSIS — J301 Allergic rhinitis due to pollen: Secondary | ICD-10-CM | POA: Insufficient documentation

## 2017-05-12 DIAGNOSIS — T7840XA Allergy, unspecified, initial encounter: Secondary | ICD-10-CM | POA: Insufficient documentation

## 2017-05-12 MED ORDER — CETIRIZINE HCL 10 MG PO TABS: 10.0000 mg | ORAL_TABLET | Freq: Every day | ORAL | 11 refills | Status: DC

## 2017-05-12 MED ORDER — FLUTICASONE PROPIONATE 50 MCG/ACT NA SUSP: 2.0000 | Freq: Every day | NASAL | 6 refills | Status: DC

## 2017-05-12 NOTE — Progress Notes (Signed)
   Subjective:    Patient ID: [name] [name], female    DOB: 09/16/1951, 66 y.o.   MRN: [id number]  HPI   Has had cough with itchy throat, nose and eyes for 8 days.  Cough productive of white mucous.  Lots of sneezing.  Chest discomfort with cough.  Also a headache with cough. No fever, though may have had a fever the first day.  Did not take her temp.  No nausea, vomiting or diarrhea.  One of her grandchildren had similar symptoms.  Only treated with Tylenol. Took unknown OTC medication for sinusitis. Helped a little bit.   No dyspnea   Has had symptoms of allergies  Essential Hypertension:  Just took her Lisinopril/HCTZ.  Was late today.  Generally takes at a regular hour.  Current Meds  Medication Sig  . lisinopril-hydrochlorothiazide (PRINZIDE,ZESTORETIC) 10-12.5 MG tablet Take 1 tablet by mouth daily.  . naproxen (NAPROSYN) 500 MG tablet Take 1 tablet (500 mg total) by mouth 2 (two) times daily with a meal.  . simvastatin (ZOCOR) 20 MG tablet Take 1 tablet (20 mg total) by mouth at bedtime.    No Known Allergies    Review of Systems     Objective:   Physical Exam   NAD HEENT:  PERRL, EOMI, conjunctivae without injection, TMs pearly gray, throat with mild injection, could not get a good view of posterior pharynx.  Nasal mucosa boggy and swollen with white discharge.  NT over frontal and maxillary sinuses. Neck:  Supple, No adenopathy Chest:  CTA CV:  RRR without murmur or rub, radial pulses normal and equal.        Assessment & Plan:  Seasonal Allergic rhinitis:   Zyrtec 10 mg daily.   Fluticasone nasal spray  2 sprays each nostril daily Call if no improvement in 7 days, sooner if worsens. Drink lots of water.

## 2017-05-12 NOTE — Patient Instructions (Signed)
Call clinic in 1 week if no improvement or if worsens.

## 2017-06-05 ENCOUNTER — Other Ambulatory Visit: Payer: Self-pay | Admitting: Internal Medicine

## 2017-06-05 DIAGNOSIS — M810 Age-related osteoporosis without current pathological fracture: Secondary | ICD-10-CM

## 2017-06-05 NOTE — Telephone Encounter (Signed)
Patient called requesting refill for alendronate (FOSAMAX) 70 MG tablet [[id number] States she needs to take it on Sunday and does not have any pills left. [facility] to [facility] [address]

## 2017-06-06 NOTE — Telephone Encounter (Signed)
Filled with first request.

## 2017-06-18 ENCOUNTER — Telehealth: Payer: Self-pay | Admitting: Internal Medicine

## 2017-06-18 ENCOUNTER — Other Ambulatory Visit: Payer: Self-pay

## 2017-06-18 DIAGNOSIS — I10 Essential (primary) hypertension: Secondary | ICD-10-CM

## 2017-06-18 MED ORDER — LISINOPRIL-HYDROCHLOROTHIAZIDE 10-12.5 MG PO TABS: 1.0000 | ORAL_TABLET | Freq: Every day | ORAL | 11 refills | Status: DC

## 2017-06-18 NOTE — Telephone Encounter (Signed)
Please inform patient Rx was sent to [facility] [address].

## 2017-06-18 NOTE — Telephone Encounter (Signed)
Patient need a refill in her blood pressure medication; [facility]

## 2017-06-19 ENCOUNTER — Telehealth: Payer: Self-pay | Admitting: Internal Medicine

## 2017-06-19 NOTE — Telephone Encounter (Signed)
Patient needs blood preasure medication; [facility] is expired she [facility] get [facility], needs a possibility to get [facility] [address]

## 2017-06-20 NOTE — Telephone Encounter (Signed)
[facility] 

## 2017-06-24 ENCOUNTER — Telehealth: Payer: Self-pay | Admitting: Internal Medicine

## 2017-06-24 ENCOUNTER — Other Ambulatory Visit: Payer: Self-pay

## 2017-06-24 DIAGNOSIS — E785 Hyperlipidemia, unspecified: Secondary | ICD-10-CM

## 2017-06-24 MED ORDER — SIMVASTATIN 20 MG PO TABS: 20.0000 mg | ORAL_TABLET | Freq: Every day | ORAL | 11 refills | Status: DC

## 2017-06-24 NOTE — Telephone Encounter (Signed)
Patient needs refill on Rx: simvastatin (ZOCOR) 20 mg. And lisinopril-hydrochlorothiazide (PRINZIDe,ZESTORECIC) 10-12.5 mg tablet.

## 2017-06-24 NOTE — Telephone Encounter (Signed)
Rx sent to pharmacy   

## 2017-06-26 ENCOUNTER — Other Ambulatory Visit: Payer: Self-pay

## 2017-07-03 ENCOUNTER — Ambulatory Visit: Payer: Self-pay | Admitting: Internal Medicine

## 2017-07-17 ENCOUNTER — Ambulatory Visit (INDEPENDENT_AMBULATORY_CARE_PROVIDER_SITE_OTHER): Payer: Self-pay | Admitting: Physician Assistant

## 2017-07-17 ENCOUNTER — Encounter (INDEPENDENT_AMBULATORY_CARE_PROVIDER_SITE_OTHER): Payer: Self-pay | Admitting: Physician Assistant

## 2017-07-17 ENCOUNTER — Ambulatory Visit: Payer: Self-pay | Admitting: Internal Medicine

## 2017-07-17 VITALS — BP 152/83 | HR 59 | Temp 97.9°F | Ht <= 58 in | Wt 186.8 lb

## 2017-07-17 DIAGNOSIS — M21961 Unspecified acquired deformity of right lower leg: Secondary | ICD-10-CM

## 2017-07-17 DIAGNOSIS — I10 Essential (primary) hypertension: Secondary | ICD-10-CM

## 2017-07-17 DIAGNOSIS — E785 Hyperlipidemia, unspecified: Secondary | ICD-10-CM

## 2017-07-17 DIAGNOSIS — M810 Age-related osteoporosis without current pathological fracture: Secondary | ICD-10-CM

## 2017-07-17 MED ORDER — MELOXICAM 15 MG PO TABS: 15.0000 mg | ORAL_TABLET | Freq: Every day | ORAL | 1 refills | Status: DC

## 2017-07-17 MED ORDER — LISINOPRIL-HYDROCHLOROTHIAZIDE 20-12.5 MG PO TABS: 2.0000 | ORAL_TABLET | Freq: Every day | ORAL | 3 refills | Status: DC

## 2017-07-17 MED ORDER — CALCIUM CARB-CHOLECALCIFEROL 600-800 MG-UNIT PO TABS: 2.0000 | ORAL_TABLET | Freq: Every day | ORAL | 11 refills | Status: DC

## 2017-07-17 MED ORDER — ACETAMINOPHEN 500 MG PO TABS: 1000.0000 mg | ORAL_TABLET | Freq: Three times a day (TID) | ORAL | 0 refills | Status: AC | PRN

## 2017-07-17 MED ORDER — ALENDRONATE SODIUM 70 MG PO TABS: ORAL_TABLET | ORAL | 11 refills | Status: DC

## 2017-07-17 MED ORDER — ALENDRONATE SODIUM 70 MG PO TABS
ORAL_TABLET | ORAL | 11 refills | Status: DC
Start: 2017-07-17 — End: 2017-09-10

## 2017-07-17 MED ORDER — ACETAMINOPHEN 500 MG PO TABS: 1000.0000 mg | ORAL_TABLET | Freq: Three times a day (TID) | ORAL | 0 refills | Status: DC | PRN

## 2017-07-17 NOTE — Patient Instructions (Signed)

## 2017-07-17 NOTE — Progress Notes (Signed)
Subjective:  Patient ID: [name] [name], female    DOB: 06/19/1951  Age: 66 y.o. MRN: [id number]  CC: HTN  HPI [name] [name] is a 66 y.o. female with a PMH of HTN, HLD, osteoporosis, cataract, astigmatism, hypermetropia, presbyopia, pterygium, and peroneal tendinitis bilaterally presents to establish care for her HTN. Was [facility] and last prescribed Prinzide 10-12.5 mg. Patient reports taking as directed qday. BP is noted to be elevated today.    Also complains of right ankle pain. Was previously seen by podiatry but only received "a few shots". Reportedly was told that she will likely need surgery of the right ankle. Patient does not know her diagnosis and notes are not available. Painful to bear weight on right ankle. Ankle may swell depending on how long she has been standing. Has taken NSAIDs in the past with only minimal relief. Denies cyanosis, erythema, ecchymosis, or paresthesia.   Outpatient Medications Prior to Visit  Medication Sig Dispense Refill  . alendronate (FOSAMAX) 70 MG tablet TAKE ONE TABLET BY MOUTH EVERY 7 DAYS . TAKE WITH A FULL GLASS OF WATER ON AN EMPTY STOMACH 4 tablet 11  . lisinopril-hydrochlorothiazide (PRINZIDE,ZESTORETIC) 10-12.5 MG tablet Take 1 tablet by mouth daily. 30 tablet 11  . naproxen (NAPROSYN) 500 MG tablet Take 1 tablet (500 mg total) by mouth 2 (two) times daily with a meal. 60 tablet 4  . simvastatin (ZOCOR) 20 MG tablet Take 1 tablet (20 mg total) by mouth at bedtime. 30 tablet 11  . cetirizine (ZYRTEC ALLERGY) 10 MG tablet Take 1 tablet (10 mg total) by mouth daily. 30 tablet 11  . fluticasone (FLONASE) 50 MCG/ACT nasal spray Place 2 sprays into both nostrils daily. 16 g 6   No facility-administered medications prior to visit.      ROS Review of Systems  Constitutional: Negative for chills, fever and malaise/fatigue.  Eyes: Negative for blurred vision.  Respiratory: Negative  for shortness of breath.   Cardiovascular: Negative for chest pain and palpitations.  Gastrointestinal: Negative for abdominal pain and nausea.  Genitourinary: Negative for dysuria and hematuria.  Musculoskeletal: Positive for joint pain. Negative for myalgias.  Skin: Negative for rash.  Neurological: Negative for tingling and headaches.  Psychiatric/Behavioral: Negative for depression. The patient is not nervous/anxious.     Objective:  Ht 4\' 10"  (1.473 m)   Wt 186 lb 12.8 oz (84.7 kg)   BMI 39.04 kg/m   BP/Weight 07/17/2017 05/12/2017 02/20/2017  Systolic BP - 160 142  Diastolic BP - 90 78  Wt. (Lbs) 186.8 183 184  BMI 39.04 40.66 39.82      Physical Exam  Constitutional: She is oriented to person, place, and time.  Well developed, well nourished, NAD, polite  HENT:  Head: Normocephalic and atraumatic.  Eyes: No scleral icterus.  Neck: Normal range of motion. Neck supple. No thyromegaly present.  Cardiovascular: Normal rate, regular rhythm and normal heart sounds.   No carotid bruit bilaterally   Pulmonary/Chest: Effort normal and breath sounds normal.  Abdominal: Soft. Bowel sounds are normal. There is no tenderness.  Musculoskeletal: She exhibits no edema.  Deformed right ankle with mild nonpitting edema, no ecchymosis, no erythema,   Neurological: She is alert and oriented to person, place, and time.  Skin: Skin is warm and dry. No rash noted. No erythema. No pallor.  Psychiatric: She has a normal mood and affect. Her behavior is normal. Thought content normal.  Vitals reviewed.    Assessment &  Plan:   1. Hypertension, unspecified type - CBC with Differential; Future - Comprehensive metabolic panel; Future - TSH; Future - lisinopril-hydrochlorothiazide (ZESTORETIC) 20-12.5 MG tablet; Take 2 tablets by mouth daily.  Dispense: 180 tablet; Refill: 3  2. Hyperlipidemia, unspecified hyperlipidemia type - Lipid Panel; Future  3. Osteoporosis, unspecified  osteoporosis type, unspecified pathological fracture presence - alendronate (FOSAMAX) 70 MG tablet; TAKE ONE TABLET BY MOUTH EVERY 7 DAYS . TAKE WITH A FULL GLASS OF WATER ON AN EMPTY STOMACH  Dispense: 4 tablet; Refill: 11 - Calcium Carb-Cholecalciferol (CALTRATE 600+D) 600-800 MG-UNIT TABS; Take 2 tablets by mouth daily.  Dispense: 60 tablet; Refill: 11 - Will order bone density study as soon as patient's financial assistance is approved. Pt does not want to accrue debt she will be unable to pay.  4. Deformity of right ankle joint - Ambulatory referral to Podiatry - meloxicam (MOBIC) 15 MG tablet; Take 1 tablet (15 mg total) by mouth daily.  Dispense: 30 tablet; Refill: 1 - acetaminophen (TYLENOL) 500 MG tablet; Take 2 tablets (1,000 mg total) by mouth every 8 (eight) hours as needed.  Dispense: 42 tablet; Refill: 0   Meds ordered this encounter  Medications  . lisinopril-hydrochlorothiazide (ZESTORETIC) 20-12.5 MG tablet    Sig: Take 2 tablets by mouth daily.    Dispense:  180 tablet    Refill:  3    Order Specific Question:   Supervising Provider    Answer:   Quentin AngstJEGEDE, OLUGBEMIGA E [id number]]  . meloxicam (MOBIC) 15 MG tablet    Sig: Take 1 tablet (15 mg total) by mouth daily.    Dispense:  30 tablet    Refill:  1    Order Specific Question:   Supervising Provider    Answer:   Quentin AngstJEGEDE, OLUGBEMIGA E [id number]]  . acetaminophen (TYLENOL) 500 MG tablet    Sig: Take 2 tablets (1,000 mg total) by mouth every 8 (eight) hours as needed.    Dispense:  42 tablet    Refill:  0    Order Specific Question:   Supervising Provider    Answer:   Quentin AngstJEGEDE, OLUGBEMIGA E [id number]]  . alendronate (FOSAMAX) 70 MG tablet    Sig: TAKE ONE TABLET BY MOUTH EVERY 7 DAYS . TAKE WITH A FULL GLASS OF WATER ON AN EMPTY STOMACH    Dispense:  4 tablet    Refill:  11    Please consider 90 day supplies to promote better adherence    Order Specific Question:   Supervising Provider    Answer:   Quentin AngstJEGEDE, OLUGBEMIGA E  [[id number]]  . Calcium Carb-Cholecalciferol (CALTRATE 600+D) 600-800 MG-UNIT TABS    Sig: Take 2 tablets by mouth daily.    Dispense:  60 tablet    Refill:  11    Order Specific Question:   Supervising Provider    Answer:   Quentin AngstJEGEDE, OLUGBEMIGA E [[id number]][[id number]]    Follow-up: 4 weeks  Loletta Specteroger David Gomez PA

## 2017-07-18 ENCOUNTER — Other Ambulatory Visit (INDEPENDENT_AMBULATORY_CARE_PROVIDER_SITE_OTHER): Payer: Self-pay

## 2017-07-18 ENCOUNTER — Ambulatory Visit: Payer: Self-pay | Attending: Physician Assistant

## 2017-07-18 DIAGNOSIS — I10 Essential (primary) hypertension: Secondary | ICD-10-CM

## 2017-07-18 DIAGNOSIS — E785 Hyperlipidemia, unspecified: Secondary | ICD-10-CM

## 2017-07-19 ENCOUNTER — Other Ambulatory Visit (INDEPENDENT_AMBULATORY_CARE_PROVIDER_SITE_OTHER): Payer: Self-pay | Admitting: Physician Assistant

## 2017-07-19 DIAGNOSIS — E785 Hyperlipidemia, unspecified: Secondary | ICD-10-CM

## 2017-07-19 LAB — COMPREHENSIVE METABOLIC PANEL
ALBUMIN: 4.5 g/dL (ref 3.6–4.8)
ALK PHOS: 73 IU/L (ref 39–117)
ALT: 17 IU/L (ref 0–32)
AST: 26 IU/L (ref 0–40)
Albumin/Globulin Ratio: 1.5 (ref 1.2–2.2)
BUN/Creatinine Ratio: 25 (ref 12–28)
BUN: 13 mg/dL (ref 8–27)
Bilirubin Total: 0.4 mg/dL (ref 0.0–1.2)
CO2: 24 mmol/L (ref 20–29)
CREATININE: 0.52 mg/dL — AB (ref 0.57–1.00)
Calcium: 9.9 mg/dL (ref 8.7–10.3)
Chloride: 104 mmol/L (ref 96–106)
GFR calc Af Amer: 114 mL/min/{1.73_m2} (ref >59)
GFR calc non Af Amer: 98 mL/min/{1.73_m2} (ref >59)
Globulin, Total: 3 g/dL (ref 1.5–4.5)
Glucose: 89 mg/dL (ref 65–99)
Potassium: 4.3 mmol/L (ref 3.5–5.2)
Sodium: 142 mmol/L (ref 134–144)
Total Protein: 7.5 g/dL (ref 6.0–8.5)

## 2017-07-19 LAB — CBC WITH DIFFERENTIAL/PLATELET
BASOS ABS: 0 10*3/uL (ref 0.0–0.2)
Basos: 0 %
EOS (ABSOLUTE): 0.3 10*3/uL (ref 0.0–0.4)
Eos: 5 %
HEMOGLOBIN: 12.7 g/dL (ref 11.1–15.9)
Hematocrit: 37.4 % (ref 34.0–46.6)
Immature Grans (Abs): 0 10*3/uL (ref 0.0–0.1)
Immature Granulocytes: 0 %
LYMPHS ABS: 1.3 10*3/uL (ref 0.7–3.1)
LYMPHS: 23 %
MCH: 31.7 pg (ref 26.6–33.0)
MCHC: 34 g/dL (ref 31.5–35.7)
MCV: 93 fL (ref 79–97)
MONOCYTES: 6 %
Monocytes Absolute: 0.4 10*3/uL (ref 0.1–0.9)
Neutrophils Absolute: 3.8 10*3/uL (ref 1.4–7.0)
Neutrophils: 66 %
Platelets: 351 10*3/uL (ref 150–379)
RBC: 4.01 x10E6/uL (ref 3.77–5.28)
RDW: 13.4 % (ref 12.3–15.4)
WBC: 5.8 10*3/uL (ref 3.4–10.8)

## 2017-07-19 LAB — LIPID PANEL
Chol/HDL Ratio: 3.2 ratio (ref 0.0–4.4)
Cholesterol, Total: 221 mg/dL — ABNORMAL HIGH (ref 100–199)
HDL: 69 mg/dL (ref >39)
LDL Calculated: 129 mg/dL — ABNORMAL HIGH (ref 0–99)
TRIGLYCERIDES: 117 mg/dL (ref 0–149)
VLDL Cholesterol Cal: 23 mg/dL (ref 5–40)

## 2017-07-19 LAB — TSH: TSH: 1.06 u[IU]/mL (ref 0.450–4.500)

## 2017-07-19 MED ORDER — LOVASTATIN 20 MG PO TABS: 40.0000 mg | ORAL_TABLET | Freq: Every day | ORAL | 3 refills | Status: DC

## 2017-07-22 ENCOUNTER — Ambulatory Visit: Payer: Self-pay | Admitting: Internal Medicine

## 2017-07-23 ENCOUNTER — Telehealth (INDEPENDENT_AMBULATORY_CARE_PROVIDER_SITE_OTHER): Payer: Self-pay | Admitting: Physician Assistant

## 2017-07-23 NOTE — Telephone Encounter (Signed)
Patient called back stated got message Rx for cholesterol was sent to pharm but she does not know which pharm.  Per [facility]  Please follow up.

## 2017-07-23 NOTE — Telephone Encounter (Signed)
Patient wants lovastatin sent to [facility] pharmacy. Maryjean Mornempestt S Shekia Kuper, CMA

## 2017-07-28 ENCOUNTER — Other Ambulatory Visit (INDEPENDENT_AMBULATORY_CARE_PROVIDER_SITE_OTHER): Payer: Self-pay | Admitting: Physician Assistant

## 2017-07-28 DIAGNOSIS — E785 Hyperlipidemia, unspecified: Secondary | ICD-10-CM

## 2017-07-28 MED ORDER — LOVASTATIN 20 MG PO TABS: 40.0000 mg | ORAL_TABLET | Freq: Every day | ORAL | 3 refills | Status: DC

## 2017-07-28 NOTE — Telephone Encounter (Signed)
Lovastatin resent to [facility]. I called her moblie and there is no messaging service after multiple rings. Please try one more time to let pt know [facility]

## 2017-07-29 ENCOUNTER — Telehealth: Payer: Self-pay | Admitting: Physician Assistant

## 2017-07-29 NOTE — Telephone Encounter (Signed)
Patient notified. Tempestt S Roberts, CMA  

## 2017-07-29 NOTE — Telephone Encounter (Signed)
Pt was approved for 100% CAFA.  Her letter was generated in Guarantor notes.  I printed a copy of the letter and scanned it into patient's documents.  I also mailed the letter to the patient

## 2017-07-29 NOTE — Telephone Encounter (Signed)
Pt came to the office to find out the statu of the financial, please follow up

## 2017-08-05 ENCOUNTER — Telehealth (INDEPENDENT_AMBULATORY_CARE_PROVIDER_SITE_OTHER): Payer: Self-pay | Admitting: Physician Assistant

## 2017-08-05 NOTE — Telephone Encounter (Signed)
Noted  

## 2017-08-05 NOTE — Telephone Encounter (Signed)
FWD to PCP. Tempestt S Roberts, CMA  

## 2017-08-05 NOTE — Telephone Encounter (Signed)
Patient is approved for Ocean[employer]ancial Assistant Program  And from her previous visit notes she need a Bone Density test.  Will order bone density study as soon as patient's financial assistance is approved.

## 2017-08-22 ENCOUNTER — Encounter: Payer: Self-pay | Admitting: Podiatry

## 2017-08-22 ENCOUNTER — Ambulatory Visit (INDEPENDENT_AMBULATORY_CARE_PROVIDER_SITE_OTHER): Payer: No Typology Code available for payment source | Admitting: Podiatry

## 2017-08-22 ENCOUNTER — Ambulatory Visit: Payer: No Typology Code available for payment source

## 2017-08-22 VITALS — BP 156/69 | HR 55 | Resp 16

## 2017-08-22 DIAGNOSIS — M779 Enthesopathy, unspecified: Secondary | ICD-10-CM

## 2017-08-22 DIAGNOSIS — M79672 Pain in left foot: Principal | ICD-10-CM

## 2017-08-22 DIAGNOSIS — M79671 Pain in right foot: Secondary | ICD-10-CM

## 2017-08-22 DIAGNOSIS — M2140 Flat foot [pes planus] (acquired), unspecified foot: Secondary | ICD-10-CM

## 2017-08-22 NOTE — Progress Notes (Signed)
Subjective:    Patient ID: [name] [name], female   DOB: 66 y.o.   MRN: [id number]   HPI patient presents with pain in both ankles and is with interpreter    ROS      Objective:  Physical Exam neurovascular status intact with severe flatfoot deformity bilateral with severe ankle instability and arthritis of the subtalar joint bilateral     Assessment:    Severe structural deformity     Plan:    H&P and I [facility] surgery and fusion of the subtalar joint which will be necessary. Today I injected the sinus tarsi to try to give temporary relief 3 mg Kenalog 5 mill grams Xylocaine bilateral

## 2017-08-22 NOTE — Progress Notes (Signed)
   Subjective:    Patient ID: [name] [name], female    DOB: 11/18/1951, 66 y.o.   MRN: [id number]  HPI Chief Complaint  Patient presents with  . Foot Pain    Bilateral; lateral side; pt stated, "Has had pain for many years and no has gotten worse"      Review of Systems  Respiratory: Positive for shortness of breath.   All other systems reviewed and are negative.      Objective:   Physical Exam        Assessment & Plan:

## 2017-09-10 ENCOUNTER — Encounter (INDEPENDENT_AMBULATORY_CARE_PROVIDER_SITE_OTHER): Payer: Self-pay | Admitting: Physician Assistant

## 2017-09-10 ENCOUNTER — Ambulatory Visit (HOSPITAL_COMMUNITY)
Admission: RE | Admit: 2017-09-10 | Discharge: 2017-09-10 | Disposition: A | Discharge status: Home / Self Care | Payer: Self-pay | Source: Ambulatory Visit | Attending: Physician Assistant | Admitting: Physician Assistant

## 2017-09-10 ENCOUNTER — Ambulatory Visit (INDEPENDENT_AMBULATORY_CARE_PROVIDER_SITE_OTHER): Payer: Self-pay | Admitting: Physician Assistant

## 2017-09-10 VITALS — BP 149/88 | HR 66 | Temp 98.1°F | Wt 188.8 lb

## 2017-09-10 DIAGNOSIS — M21962 Unspecified acquired deformity of left lower leg: Secondary | ICD-10-CM

## 2017-09-10 DIAGNOSIS — M19072 Primary osteoarthritis, left ankle and foot: Secondary | ICD-10-CM | POA: Insufficient documentation

## 2017-09-10 DIAGNOSIS — M19071 Primary osteoarthritis, right ankle and foot: Secondary | ICD-10-CM | POA: Insufficient documentation

## 2017-09-10 DIAGNOSIS — Z23 Encounter for immunization: Secondary | ICD-10-CM

## 2017-09-10 DIAGNOSIS — M21961 Unspecified acquired deformity of right lower leg: Secondary | ICD-10-CM

## 2017-09-10 DIAGNOSIS — M85871 Other specified disorders of bone density and structure, right ankle and foot: Secondary | ICD-10-CM | POA: Insufficient documentation

## 2017-09-10 MED ORDER — ACETAMINOPHEN-CODEINE #3 300-30 MG PO TABS: 1.0000 | ORAL_TABLET | Freq: Three times a day (TID) | ORAL | 0 refills | Status: AC | PRN

## 2017-09-10 MED ORDER — MELOXICAM 15 MG PO TABS: 15.0000 mg | ORAL_TABLET | Freq: Every day | ORAL | 0 refills | Status: DC

## 2017-09-10 NOTE — Patient Instructions (Signed)
Acetaminophen; Codeine tablets  Qu es este medicamento? El compuesto ACETAMINOFENO; CODENA es un analgsico. Se utiliza para tratar los dolores leves a moderados. Este medicamento puede ser utilizado para otros usos; si tiene alguna pregunta consulte con su proveedor de atencin mdica o con su farmacutico. MARCAS COMUNES: Cocet, Cocet Plus, Tylenol with Codeine No.3, Tylenol with Codeine No.4, Vopac Qu le debo informar a mi profesional de la salud antes de tomar este medicamento? Necesita saber si usted presenta alguno de los [employer] o situaciones: -tumor cerebral -enfermedad de Crohn, enfermedad intestinal inflamatoria o colitis ulcerativa -abuso de drogas o drogadiccin -lesin de la cabeza -problemas cardiacos o circulatorios -si consume alcohol con frecuencia -enfermedad renal o problemas al orinar -enfermedad heptica -enfermedad pulmonar, asma o dificultades al respirar -una reaccin alrgica o inusual al acetaminofeno, a la codena, a los salicilatos, a otros analgsicos opiceos, a otros medicamentos, alimentos, colorantes o conservantes -si est embarazada o buscando quedar embarazada -si est amamantando a un beb Cmo debo utilizar este medicamento? Tome este medicamento por va oral con un vaso lleno de agua. Siga las instrucciones de la etiqueta del [place]. Puede tomarlo con o sin alimentos. Si le produce Higher education careers adviser, tome el medicamento con alimentos. No tome su medicamento con una frecuencia mayor a la indicada. Su farmacutico le dar una Gua del medicamento especial (MedGuide, su nombre en ingls) con cada receta y en cada ocasin que la vuelva a surtir. Asegrese de leer esta informacin cada vez cuidadosamente. Hable con su pediatra para informarse acerca del uso de este medicamento en nios. Puede requerir atencin especial. Sobredosis: Pngase en contacto inmediatamente con un centro toxicolgico o una sala de urgencia si usted cree que  haya tomado demasiado medicamento. ATENCIN: [employer] es solo para usted. No comparta este medicamento con nadie. Qu sucede si me olvido de una dosis? Si olvida una dosis, tmela lo antes posible. Si es casi la hora de la prxima dosis, tome slo esa dosis. No tome dosis adicionales o dobles. Qu puede interactuar con este medicamento? Esta medicina puede interactuar con los siguientes medicamentos: alcohol antihistamnicos para Buyer, retail, tos y resfro medicamentos antivirales usados para el VIH o SIDA atropina ciertos antibiticos, tales como eritromicina y claritromicina ciertos medicamentos para la ansiedad o para conciliar el sueo ciertos medicamentos para problemas de vejiga, tales como oxibutinina, tolterodina ciertos medicamentos para la depresin, como amitriptilina, fluoxetina, sertralina ciertos medicamentos para infecciones micticas, tales como itraconazol y Arboriculturist ciertos medicamentos para el pulso cardiaco irregular, tales como amiodarona, propafenona, quinidina ciertos medicamentos para el mal de Parkinson, tales como benzatropina, trihexifenidilo ciertos medicamentos para convulsiones, tales como carbamazepina, fenobarbital, fenitona, primidona ciertos medicamentos para problemas estomacales, tales como diciclomina, hiosciamina ciertos medicamentos para el mareo por movimiento, tal como escopolamina anestsicos generales, tales como halotano, isoflurano, metoxiflurano, propofol ipratropio anestsicos locales, tales como lidocana, pramoxina, tetracana IMAO, tales como Carbex, Eldepryl, Marplan, Nardil y Parnate medicamentos para relajar los msculos antes de una ciruga otros medicamentos con acetaminofeno otros medicamentos narcticos para Conservation officer, historic buildings o la tos fenotiazinas, tales como clorpromazina, Musician, Government social research officer, tioridazina rifampicina Puede ser que esta lista no menciona todas las posibles interacciones. Informe a su profesional de [employer]	[place] de [employer]  productos a base de hierbas, medicamentos de [place] o suplementos nutritivos que est tomando. Si usted fuma, consume bebidas alcohlicas o si utiliza drogas ilegales, indqueselo tambin a su profesional de [employer]	[place]. Algunas sustancias pueden interactuar con su medicamento. A qu debo estar atento al usar Coca[employer]a? Informe  a su mdico o a su profesional de la salud si el dolor no desaparece, si empeora, o si tiene un dolor nuevo o diferente. Es posible que desarrolle tolerancia al [employer]. Tolerancia significa que necesitar una dosis ms alta del medicamento para Best boy. La tolerancia es normal y previsible si toma este medicamento por [employer]. No deje de usar su medicamento repentinamente porque puede desarrollar una reaccin grave. Su cuerpo se acostumbra al [employer]. Esto NO significa que usted es [place]. La adiccin es una conducta relacionada a la obtencin y [place] de una droga por razones que no son mdicas. Si usted tiene Social research officer, government, tiene una razn mdica para tomar el analgsico. Su mdico le dir cunto medicamento tomar. Si su mdico desea que deje de Environmental consultant, la dosis se ir disminuyendo lentamente durante un tiempo para Midwife secundario. Existen distintos tipos de medicamentos narcticos (opiceos). Si toma ms de un tipo al [employer] tiempo o si est tomando otro medicamento que tambin causa somnolencia, es posible que tenga ms efectos secundarios. Entrguele a su proveedor de atencin mdica una lista de todos los medicamentos que [place]. Su mdico le dir cunto Dentist. No tome ms medicamento que lo indicado. Llame al servicio de emergencias para recibir ayuda si tiene problemas para respirar o somnolencia inusual. No tome con este medicamento otros medicamentos que contengan acetaminofeno. Siempre lea atentamente las etiquetas. Si tiene [place] duda, pregntele a su mdico o farmacutico. Si toma demasiado acetaminofeno,  busque ayuda mdica de inmediato. Demasiado acetaminofeno puede ser muy peligroso y causar dao heptico. Incluso si no tiene sntomas, es importante obtener ayuda de inmediato. Puede experimentar mareos o somnolencia. No conduzca, no utilice maquinaria ni haga nada que Associate Professor en estado de alerta hasta que sepa cmo le afecta este medicamento. No se siente ni se ponga de pie con rapidez, especialmente si es un paciente de edad avanzada. Esto reduce el riesgo de mareos o [employer]. El alcohol puede interferir con el efecto de este medicamento. Evite consumir bebidas alcohlicas. El medicamento causar estreimiento. Trate de evacuar los intestinos al menos cada 2 o 3 das. Si no evacua los intestinos durante 3 das, comunquese con su mdico o con su profesional de [employer]	[place]. Se le podra secar la boca. Masticar chicle sin azcar, chupar caramelos duros y tomar agua en abundancia le ayudar a mantener la boca hmeda. Si el problema no desaparece o es severo, consulte a su mdico. Llame inmediatamente a su mdico o busque ayuda de emergencia si est amamantando y su beb tiene ms sueo de lo habitual, est sin fuerzas, o tiene dificultades para amamantar o para respirar. Los nios pueden tener mayor riesgo de sufrir efectos secundarios. Si su hijo tiene respiracin lenta, respiracin con ruido, confusin o somnolencia inusual, deje de darle este medicamento y busque ayuda mdica de inmediato. Qu efectos secundarios puedo tener al [employer] este medicamento? Efectos secundarios que debe informar a su mdico o a Barrister's clerk de la salud tan pronto como sea posible: Chief of Staff, como erupcin cutnea, comezn/picazn o urticarias, hinchazn de la cara, los labios o la lengua problemas respiratorios confusin enrojecimiento, formacin de ampollas, descamacin o distensin de la piel, incluso dentro de la boca signos y sntomas de presin sangunea baja, tales como [place], sensacin de  [place] o aturdimiento, cadas, cansancio o debilidad inusual dificultad para orinar o cambios en el volumen de orina color amarillento de los ojos o la piel Efectos secundarios que generalmente no requieren  atencin mdica (debe informarlos a su mdico o a su profesional de la salud si persisten o si son molestos): estreimiento boca seca nuseas, vmito cansancio Puede ser que esta lista no menciona todos los posibles efectos secundarios. Comunquese a su mdico por asesoramiento mdico [employer]. Usted puede informar los efectos secundarios a la FDA por telfono al 1-800-FDA-1088. Dnde debo guardar mi medicina? Mantenga fuera del alcance de los nios. Existe la posibilidad de abusar de [employer]. Mantenga su medicamento en un lugar seguro para protegerlo contra robos. No comparta este medicamento con nadie. Es peligroso vender o Restaurant manager, fast food, y est prohibido por la ley. Este medicamento puede causar Neomia Dear sobredosis accidental y la muerte si lo toman otros adultos, nios o Neurosurgeon. Mezcle todo el medicamento sin usar con una sustancia como piedras sanitarias para gatos o posos (residuos) de caf. Luego deseche el [employer] International un recipiente cerrado, como una bolsa sellada o una lata de caf con tapa. No utilice este medicamento despus de la fecha de vencimiento. Guarde a [employer], entre 15 y 30 grados Celsius (59 y 56 grados Fahrenheit). ATENCIN: Este folleto es un resumen. Puede ser que no cubra toda la posible informacin. Si usted tiene preguntas acerca de esta medicina, consulte con su mdico, su farmacutico o su profesional de Radiographer, therapeutic.  2018 Elsevier/Gold Standard (2017-01-09 00:00:00)

## 2017-09-10 NOTE — Progress Notes (Signed)
Pt complains of still having pain in both feet Pt states the right foot hurts more

## 2017-09-10 NOTE — Progress Notes (Signed)
Subjective:  Patient ID: [name] [name], female    DOB: [birth year]  Age: 66 y.o. MRN: [id number]  CC: foot pain  HPI  [name] [name] is a 66 y.o. female with a PMH of HTN, HLD, osteoporosis, cataract, astigmatism, hypermetropia, presbyopia, pterygium, and peroneal tendinitis bilaterally presents to f/u on bilateral ankle pain and deformity. She was referred to podiatry and was seen on 08/22/17.  Podiatry reportedly injected her ankles and told her there is nothing else that can be done. No podiatry notes to refer to. Patient has not had any relief of pain since the injection. She still uses a walker when pain is at its greatest. At times she feels as if the ankle will break and cause her to fall. She has difficulty ambulating and requests help with specialty consult and pain management.      Outpatient Medications Prior to Visit  Medication Sig Dispense Refill  . Calcium Carb-Cholecalciferol (CALTRATE 600+D) 600-800 MG-UNIT TABS Take 2 tablets by mouth daily. 60 tablet 11  . lisinopril-hydrochlorothiazide (ZESTORETIC) 20-12.5 MG tablet Take 2 tablets by mouth daily. 180 tablet 3  . lovastatin (MEVACOR) 20 MG tablet Take 2 tablets (40 mg total) by mouth at bedtime. 180 tablet 3  . alendronate (FOSAMAX) 70 MG tablet TAKE ONE TABLET BY MOUTH EVERY 7 DAYS . TAKE WITH A FULL GLASS OF WATER ON AN EMPTY STOMACH 4 tablet 11   No facility-administered medications prior to visit.      ROS Review of Systems  Constitutional: Negative for chills, fever and malaise/fatigue.  Eyes: Negative for blurred vision.  Respiratory: Negative for shortness of breath.   Cardiovascular: Negative for chest pain and palpitations.  Gastrointestinal: Negative for abdominal pain and nausea.  Genitourinary: Negative for dysuria and hematuria.  Musculoskeletal: Negative for joint pain and myalgias.  Skin: Negative for rash.  Neurological: Negative for tingling and headaches.  Psychiatric/Behavioral:  Negative for depression. The patient is not nervous/anxious.     Objective:  BP (!) 150/99 (BP Location: Right Arm, Patient Position: Sitting, Cuff Size: Large)   Pulse 66   Temp 98.1 F (36.7 C) (Oral)   Wt 188 lb 12.8 oz (85.6 kg)   SpO2 99%   BMI 39.46 kg/m   BP/Weight 09/10/2017 08/22/2017 07/17/2017  Systolic BP 150 156 152  Diastolic BP 99 69 83  Wt. (Lbs) 188.8 - 186.8  BMI 39.46 - 39.04      Physical Exam  Constitutional: She is oriented to person, place, and time.  Well developed, well nourished, NAD, polite  HENT:  Head: Normocephalic and atraumatic.  Eyes: No scleral icterus.  Neck: Normal range of motion. Neck supple. No thyromegaly present.  Cardiovascular: Normal rate, regular rhythm and normal heart sounds.   Pulmonary/Chest: Effort normal and breath sounds normal.  Musculoskeletal: She exhibits no edema.  Deformity of the bilateral ankle appearing as acquired flat foot deformity   Neurological: She is alert and oriented to person, place, and time.  Antalgic gait  Skin: Skin is warm and dry. No rash noted. No erythema. No pallor.  Psychiatric: She has a normal mood and affect. Her behavior is normal. Thought content normal.  Vitals reviewed.    Assessment & Plan:    1. Deformity of right ankle joint - DG Ankle Complete Left; Future - DG Ankle Complete Right; Future - Begin acetaminophen-codeine (TYLENOL #3) 300-30 MG tablet; Take 1 tablet by mouth every 8 (eight) hours as needed for moderate pain.  Dispense: 45 tablet; Refill: 0 -  Begin meloxicam (MOBIC) 15 MG tablet; Take 1 tablet (15 mg total) by mouth daily.  Dispense: 30 tablet; Refill: 0 - Ambulatory referral to Orthopedic Surgery  2. Deformity of left ankle joint - DG Ankle Complete Left; Future - DG Ankle Complete Right; Future - Begin acetaminophen-codeine (TYLENOL #3) 300-30 MG tablet; Take 1 tablet by mouth every 8 (eight) hours as needed for moderate pain.  Dispense: 45 tablet; Refill:  0 -Begin meloxicam (MOBIC) 15 MG tablet; Take 1 tablet (15 mg total) by mouth daily.  Dispense: 30 tablet; Refill: 0 - Ambulatory referral to Orthopedic Surgery   Meds ordered this encounter  Medications  . acetaminophen-codeine (TYLENOL #3) 300-30 MG tablet    Sig: Take 1 tablet by mouth every 8 (eight) hours as needed for moderate pain.    Dispense:  45 tablet    Refill:  0    Order Specific Question:   Supervising Provider    Answer:   Quentin Angst [id number]  . meloxicam (MOBIC) 15 MG tablet    Sig: Take 1 tablet (15 mg total) by mouth daily.    Dispense:  30 tablet    Refill:  0    Order Specific Question:   Supervising Provider    Answer:   Quentin Angst [id number]    Follow-up: Return in about 8 weeks (around 11/05/2017) for ankle .   Loletta Specter PA

## 2017-09-11 ENCOUNTER — Telehealth (INDEPENDENT_AMBULATORY_CARE_PROVIDER_SITE_OTHER): Payer: Self-pay

## 2017-09-11 NOTE — Telephone Encounter (Signed)
-----   Message from Loletta Specter, PA-C sent at 09/10/2017  6:13 PM EDT ----- Osteopenia and degenerative changes of the ankles. Referral to orthopedics has already been made.

## 2017-09-11 NOTE — Telephone Encounter (Signed)
Called patient using pacific interpreters Lars Mage [phone], left message for patient to call office. Maryjean Morn, CMA

## 2017-10-02 ENCOUNTER — Ambulatory Visit (INDEPENDENT_AMBULATORY_CARE_PROVIDER_SITE_OTHER): Payer: No Typology Code available for payment source | Admitting: Orthopedic Surgery

## 2017-10-13 ENCOUNTER — Ambulatory Visit (INDEPENDENT_AMBULATORY_CARE_PROVIDER_SITE_OTHER): Payer: Self-pay | Admitting: Orthopedic Surgery

## 2017-10-13 DIAGNOSIS — M25571 Pain in right ankle and joints of right foot: Secondary | ICD-10-CM

## 2017-10-14 ENCOUNTER — Encounter (INDEPENDENT_AMBULATORY_CARE_PROVIDER_SITE_OTHER): Payer: Self-pay | Admitting: Orthopedic Surgery

## 2017-10-14 NOTE — Progress Notes (Signed)
Office Visit Note   Patient: [name] [name]           Date of Birth: September 10, 1951           MRN: [id number] Visit Date: 10/13/2017 Requested by: Loletta Specter, PA-C [address] [place], [place] [address] PCP: Denny Levy  Subjective: Chief Complaint  Patient presents with  . Right Ankle - Pain  . Left Ankle - Pain    HPI: [name] [name] is a 66 year old female with bilateral ankle pain and deformity.  She has had chronic pain for years.  She is taking Tylenol 3 for pain.  Has had injections in the past.  Outside radiographs are reviewed from September and they show significant degenerative changes within the midfoot region in both feet.  Right foot hurts worse than the left.  She has limited walking endurance.  She is not a smoker and does not have diabetes.              ROS: All systems reviewed are negative as they relate to the chief complaint within the history of present illness.  Patient denies  fevers or chills.   Assessment & Plan: Visit Diagnoses:  1. 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  This is a surgical problem based upon discussions with the patient.  Plan CT scan right ankle with follow-up with Dr. due to for likely a foot realignment and foot fusion surgery  Follow-Up Instructions: No Follow-up on file.   Orders:  Orders Placed This Encounter  Procedures  . CT ANKLE RIGHT WO CONTRAST   No orders of the defined types were placed in this encounter.     Procedures: No procedures performed   Clinical Data: No additional findings.  Objective: Vital Signs: There were no vitals taken for this visit.  Physical Exam:   Constitutional: Patient appears well-developed HEENT:  Head: Normocephalic Eyes:EOM are normal Neck: Normal range of motion Cardiovascular: Normal  rate Pulmonary/chest: Effort normal Neurologic: Patient is alert Skin: Skin is warm Psychiatric: Patient has normal mood and affect    Ortho Exam: Orthopedic exam demonstrates forefoot collapse bilaterally and midfoot collapse bilaterally with forefoot eversion.  She has diminished/absent subtalar motion on the right with only mild motion on the left.  Pedal pulses are palpable.  No other masses lymphadenopathy or skin changes noted in bilateral foot region.  End-stage posterior tib tendon deficiency is present  Specialty Comments:  No specialty comments available.  Imaging: No results found.   PMFS History: Patient Active Problem List   Diagnosis Date Noted  . Seasonal allergic rhinitis due to pollen 05/12/2017  . Allergy   . Cataract 01/28/2017  . Presbyopia of both eyes 01/28/2017  . Pterygium eye, 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  . Astigmatism, 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  . 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 Acquired bilateral flat feet 09/07/2015  . Osteoporosis 09/07/2015  . Transaminasemia 01/26/2015  . Generalized anxiety disorder 01/26/2015  . Essential hypertension 07/28/2014  . Gastritis 07/28/2014  . 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  . OBESITY 05/10/2009  . NEPHROLITHIASIS 06/15/2008  . Dyslipidemia 03/16/2008  . 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 BUNDLE BRANCH BLOCK, LEFT 12/11/2007  . PTSD 10/01/2007  . ARM PAIN, LEFT 10/01/2007   Past Medical History:  Diagnosis Date  . Allergy    spring pollen  . Arthritis   .  Astigmatism, 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  . Cataract 01/28/2017  . High cholesterol   . 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 Hypertension   . Osteoporosis   . Presbyopia of both eyes 01/28/2017  . Pterygium eye, 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    No family history on file.  No past surgical history on file. Social History   Occupational History  . Not on file.   Social History Main Topics  . Smoking status: Never Smoker  . Smokeless tobacco: Never Used  . Alcohol  use No  . Drug use: No  . Sexual activity: Not on file

## 2017-10-21 ENCOUNTER — Ambulatory Visit (HOSPITAL_COMMUNITY)
Admission: RE | Admit: 2017-10-21 | Discharge: 2017-10-21 | Disposition: A | Discharge status: Home / Self Care | Payer: Self-pay | Source: Ambulatory Visit | Attending: Orthopedic Surgery | Admitting: Orthopedic Surgery

## 2017-10-21 DIAGNOSIS — M19071 Primary osteoarthritis, right ankle and foot: Secondary | ICD-10-CM | POA: Insufficient documentation

## 2017-10-21 DIAGNOSIS — M21071 Valgus deformity, not elsewhere classified, right ankle: Secondary | ICD-10-CM | POA: Insufficient documentation

## 2017-10-21 DIAGNOSIS — M25571 Pain in right ankle and joints of right foot: Secondary | ICD-10-CM | POA: Insufficient documentation

## 2017-11-03 ENCOUNTER — Ambulatory Visit (INDEPENDENT_AMBULATORY_CARE_PROVIDER_SITE_OTHER): Payer: No Typology Code available for payment source | Admitting: Orthopedic Surgery

## 2017-11-03 ENCOUNTER — Encounter (INDEPENDENT_AMBULATORY_CARE_PROVIDER_SITE_OTHER): Payer: Self-pay | Admitting: Orthopedic Surgery

## 2017-11-03 DIAGNOSIS — M19171 Post-traumatic osteoarthritis, right ankle and foot: Secondary | ICD-10-CM

## 2017-11-03 NOTE — Progress Notes (Signed)
Office Visit Note   Patient: [name] [name]           Date of Birth: 06/11/1951           MRN: [id number] Visit Date: 11/03/2017              Requested by: Loletta SpecterGomez, Roger David, PA-C [address] [place], [place] [id number] PCP: Loletta SpecterGomez, Roger David, PA-C  Chief Complaint  Patient presents with  . 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  Patient has had progressive pronation and valgus with a fixed hindfoot with degenerative arthritic pain in the ankle and subtalar joint.  Patient has tried custom orthotics extra-depth shoes stabilizing braces all without relief.  She states she cannot perform her activities of daily living due to pain.  She states she does not have pain over the talonavicular joint region.  Assessment & Plan: Visit Diagnoses:  1. Post-traumatic osteoarthritis, right ankle and foot     Plan: Discussed with the patient with her advanced traumatic arthritis of the ankle and subtalar joint that her only option would be to proceed with a fusion from the calcaneus to the talus to the tibia.  Discussed that with her arthritic changes at the talonavicular joint she would be at increased risk of pain through the midfoot.  Discussed that further fusions would be more symptomatic than limiting fusion to the tibial calcaneal joint.  Risks and benefits were discussed including infection neurovascular injury persistent pain need for additional surgery.  Discussed the patient will need to be off her foot for 2 months.  Patient states she understands she states she would like to proceed at this time.  Interpretation provided through the office.  Follow-Up Instructions: Return in about 2 weeks (around 11/17/2017).   Ortho Exam  Patient is alert, oriented, no adenopathy, well-dressed, normal affect, normal respiratory effort. Examination patient has a good dorsalis pedis and posterior tibial  pulse she has an antalgic gait.  She has a fixed valgus hindfoot with no subtalar motion she is tender to palpation of the sinus Tarsi.  She has limited range of motion of the ankle and has tenderness to palpation anteriorly over the ankle.  The posterior tibial tendon and the talonavicular joint which are prominent are nontender to palpation.  Review of the CT scan shows advanced degenerative changes of the subtalar and tibiotalar joint.  There is some mild arthritic changes of the talonavicular joint.  Imaging: No results found. No images are attached to the encounter.  Labs: Lab Results  Component Value Date   HGBA1C 5.6 11/08/2013   ESRSEDRATE 20 09/17/2007   REPTSTATUS 01/11/2008 FINAL 01/09/2008   CULT  01/09/2008    LACTOBACILLUS SPECIES Note: Standardized susceptibility testing for this organism is not available.   LABORGA Normal Upper Respiratory Flora 07/28/2014   LABORGA No Beta Hemolytic Streptococci Isolated 07/28/2014    Orders:  No orders of the defined types were placed in this encounter.  No orders of the defined types were placed in this encounter.    Procedures: No procedures performed  Clinical Data: No additional findings.  ROS:  All other systems negative, except as noted in the HPI. Review of Systems  Objective: Vital Signs: There were no vitals taken for this visit.  Specialty Comments:  No specialty comments available.  PMFS History: Patient Active Problem List   Diagnosis Date Noted  . Seasonal allergic  rhinitis due to pollen 05/12/2017  . Allergy   . Cataract 01/28/2017  . Presbyopia of both eyes 01/28/2017  . Pterygium eye, 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  . Astigmatism, 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  . 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 Acquired bilateral flat feet 09/07/2015  . Osteoporosis 09/07/2015  . Transaminasemia 01/26/2015  . Generalized anxiety disorder 01/26/2015  . Essential hypertension 07/28/2014  . Gastritis 07/28/2014  . 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 OBESITY 05/10/2009  . NEPHROLITHIASIS 06/15/2008  . Dyslipidemia 03/16/2008  . 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 BUNDLE BRANCH BLOCK, LEFT 12/11/2007  . PTSD 10/01/2007  . ARM PAIN, LEFT 10/01/2007   Past Medical History:  Diagnosis Date  . Allergy    spring pollen  . Arthritis   . Astigmatism, 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  . Cataract 01/28/2017  . High cholesterol   . 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 Hypertension   . Osteoporosis   . Presbyopia of both eyes 01/28/2017  . Pterygium eye, 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    History reviewed. No pertinent family history.  History reviewed. No pertinent surgical history. Social History   Occupational History  . Not on file  Tobacco Use  . Smoking status: Never Smoker  . Smokeless tobacco: Never Used  Substance and Sexual Activity  . Alcohol use: No    Alcohol/week: 0.0 oz  . Drug use: No  . Sexual activity: Not on file

## 2017-11-05 ENCOUNTER — Other Ambulatory Visit: Payer: Self-pay

## 2017-11-05 ENCOUNTER — Ambulatory Visit (INDEPENDENT_AMBULATORY_CARE_PROVIDER_SITE_OTHER): Payer: Self-pay | Admitting: Physician Assistant

## 2017-11-05 ENCOUNTER — Encounter (INDEPENDENT_AMBULATORY_CARE_PROVIDER_SITE_OTHER): Payer: Self-pay | Admitting: Physician Assistant

## 2017-11-05 VITALS — BP 142/80 | HR 65 | Temp 97.9°F | Wt 186.8 lb

## 2017-11-05 DIAGNOSIS — M21962 Unspecified acquired deformity of left lower leg: Secondary | ICD-10-CM

## 2017-11-05 DIAGNOSIS — M19171 Post-traumatic osteoarthritis, right ankle and foot: Secondary | ICD-10-CM

## 2017-11-05 DIAGNOSIS — Z1159 Encounter for screening for other viral diseases: Secondary | ICD-10-CM

## 2017-11-05 DIAGNOSIS — Z1239 Encounter for other screening for malignant neoplasm of breast: Secondary | ICD-10-CM

## 2017-11-05 DIAGNOSIS — Z1231 Encounter for screening mammogram for malignant neoplasm of breast: Secondary | ICD-10-CM

## 2017-11-05 DIAGNOSIS — M21961 Unspecified acquired deformity of right lower leg: Secondary | ICD-10-CM

## 2017-11-05 MED ORDER — HYDROCODONE-ACETAMINOPHEN 5-325 MG PO TABS: 0.5000 | ORAL_TABLET | Freq: Two times a day (BID) | ORAL | 0 refills | Status: DC | PRN

## 2017-11-05 MED ORDER — MELOXICAM 15 MG PO TABS: 15.0000 mg | ORAL_TABLET | Freq: Every day | ORAL | 0 refills | Status: DC

## 2017-11-05 MED FILL — MELOXICAM 15 MG TABLET: 15 | 30 days supply | Qty: 30 | Fill #0

## 2017-11-05 MED FILL — LISINOPRIL-HCTZ 20-12.5 MG: 20-12.5 | 90 days supply | Qty: 180 | Fill #0

## 2017-11-05 NOTE — Patient Instructions (Signed)
Acetaminophen; Hydrocodone tablets or capsules Qu es este medicamento? El ACETAMINOFENO; HIDROCODONA es un analgsico. Se [place] para tratar el dolor moderado a severo. Este medicamento puede ser utilizado para otros usos; si tiene [place] pregunta consulte con su proveedor de atencin mdica o con su farmacutico. MARCAS COMUNES: Anexsia, Bancap HC, Ceta-Plus, Co-Gesic, Comfortpak, Dolagesic, Du Pont, [address]/[employer], [address], Hydrogesic, [place], Lorcet HD, Lorcet Plus, Lortab, Margesic H, Maxidone, UGI Corporation, Polygesic, Office manager, [place], Retail buyer, Vicodin, Vicodin ES, Vicodin HP, Xodol, Entergy Corporation le debo informar a mi profesional de la salud antes de tomar este medicamento? Necesita saber si usted presenta alguno de los [employer] o situaciones: -tumor cerebral -enfermedad de Crohn, enfermedad intestinal inflamatoria o colitis ulcerativa -abuso de drogas o drogadiccin -lesin de la cabeza -problemas cardiacos o circulatorios -si consume alcohol con frecuencia -enfermedad renal o problemas al orinar -enfermedad heptica -enfermedad pulmonar, asma o dificultades al respirar -una reaccin alrgica o inusual al acetaminofeno, a la hidrocodona, a otros analgsicos opiceos, a otros medicamentos, alimentos, colorantes o conservantes -si est embarazada o buscando quedar embarazada -si est amamantando a un beb Cmo debo utilizar este medicamento? Tome este medicamento por va oral con un vaso de agua. Siga las instrucciones de la etiqueta del [place]. Puede tomarlo con o sin alimentos. Si el Social worker, tmelo con alimentos. No tome su medicamento con una frecuencia mayor a la indicada. Su farmacutico le dar una Gua del medicamento especial (MedGuide, su nombre en ingls) con cada receta y en cada ocasin que la vuelva a surtir. Asegrese de leer esta informacin cada vez cuidadosamente. Hable con su pediatra para informarse acerca del uso de este  medicamento en nios. Puede requerir atencin especial. Sobredosis: Pngase en contacto inmediatamente con un centro toxicolgico o una sala de urgencia si usted cree que haya tomado demasiado medicamento. ATENCIN: Reynolds American es solo para usted. No comparta este medicamento con nadie. Qu sucede si me olvido de una dosis? Si olvida una dosis, tmela lo antes posible. Si es casi la hora de la prxima dosis, tome slo esa dosis. No tome dosis adicionales o dobles. Qu puede interactuar con este medicamento? Esta medicina puede interactuar con los siguientes medicamentos: alcohol medicamentos antivirales para el VIH o SIDA atropina antihistamnicos para Programmer, multimedia, tos y resfro ciertos antibiticos, tales como eritromicina y claritromicina ciertos medicamentos para la ansiedad o para conciliar el sueo ciertos medicamentos para problemas de vejiga, tales como oxibutinina, tolterodina ciertos medicamentos para la depresin, como amitriptilina, fluoxetina, sertralina ciertos medicamentos para infecciones micticas, tales como itraconazol y Tax adviser ciertos medicamentos para el mal de Parkinson, tales como benzatropina, trihexifenidilo ciertos medicamentos para convulsiones, tales como carbamazepina, fenobarbital, fenitona, primidona ciertos medicamentos para problemas estomacales, tales como diciclomina, hiosciamina ciertos medicamentos para el mareo por movimiento, tal como escopolamina anestsicos generales, tales como halotano, isoflurano, metoxiflurano, propofol ipratropio anestsicos locales, tales como lidocana, pramoxina, tetracana IMAO, tales como Carbex, Eldepryl, Marplan, Nardil y Parnate medicamentos para relajar los msculos antes de una ciruga otros medicamentos con acetaminofeno otros medicamentos narcticos para Chief Technology Officer o la tos fenotiazinas, tales como clorpromazina, Manufacturing engineer, Paediatric nurse, tioridazina rifampicina Puede ser que esta lista no menciona todas las posibles  interacciones. Informe a su profesional de [employer] de In[employer] Inc productos a base de hierbas, medicamentos de [place] o suplementos nutritivos que est tomando. Si usted fuma, consume bebidas alcohlicas o si utiliza drogas ilegales, indqueselo tambin a su profesional de [employer]. Algunas sustancias pueden interactuar con su medicamento. A qu debo estar atento al  usar PPL Corporationeste medicamento? Informe a su mdico o a su profesional de la salud si el dolor no desaparece, si empeora, o si tiene un dolor nuevo o diferente. Es posible que desarrolle tolerancia al Automatic Datamedicamento. Tolerancia significa que necesitar una dosis ms alta del medicamento para Engineer, materialsaliviar el dolor. La tolerancia es normal y previsible si toma este medicamento por Con-waymucho tiempo. No deje de usar su medicamento repentinamente porque puede desarrollar una reaccin grave. Su cuerpo se acostumbra al Automatic Datamedicamento. Esto NO significa que usted es Garrettadicto. La adiccin es una conducta relacionada a la obtencin y Douglasel uso de una droga por razones que no son mdicas. Si usted tiene Engineer, miningdolor, tiene una razn mdica para tomar el analgsico. Su mdico le dir cunto medicamento tomar. Si su mdico desea que deje de Astronomerusar el medicamento, la dosis se ir disminuyendo lentamente durante un tiempo para Printmakerevitar cualquier efecto secundario. Existen distintos tipos de medicamentos narcticos (opiceos). Si toma ms de un tipo al Lockheed Martinmismo tiempo o si est tomando otro medicamento que tambin causa somnolencia, es posible que tenga ms efectos secundarios. Entrguele a su proveedor de atencin mdica una lista de todos los medicamentos que [place]. Su mdico le dir cunto Risk managermedicamento tomar. No tome ms medicamento que lo indicado. Llame al servicio de emergencias para recibir ayuda si tiene problemas para respirar o somnolencia inusual. No tome con este medicamento otros medicamentos que contengan acetaminofeno. Siempre lea atentamente las etiquetas. Si tiene [place]alguna duda, pregntele  a su mdico o farmacutico. Si toma demasiado acetaminofeno, busque ayuda mdica de inmediato. Demasiado acetaminofeno puede ser muy peligroso y causar dao heptico. Incluso si no tiene sntomas, es importante obtener ayuda de inmediato. Puede experimentar somnolencia o mareos. No conduzca, no utilice maquinaria ni haga nada que Scientist, research (life sciences)le exija permanecer en estado de alerta hasta que sepa cmo le afecta este medicamento. No se siente ni se ponga de pie con rapidez, especialmente si es un paciente de edad avanzada. Esto reduce el riesgo de mareos o Newell Rubbermaiddesmayos. El alcohol puede interferir con el efecto de [place] Sandraeste medicamento. Evite consumir bebidas alcohlicas. El medicamento causar estreimiento. Trate de evacuar los intestinos al menos cada 2 o 3 das. Si no evacua los intestinos durante [address]  [address], comunquese con su mdico o con su profesional de Beazer Homesla salud. Se le podra secar la boca. Masticar chicle sin azcar, chupar caramelos duros y tomar agua en abundancia lo ayudar a mantener la boca hmeda. Si el problema no desaparece o es severo, consulte a su mdico. Qu efectos secundarios puedo tener al Boston Scientificutilizar este medicamento? Efectos secundarios que debe informar a su mdico o a Producer, television/film/videosu profesional de la salud tan pronto como sea posible: Therapist, artreacciones alrgicas, como erupcin cutnea, comezn/picazn o urticarias, hinchazn de la cara, los labios o la lengua problemas respiratorios confusin enrojecimiento, formacin de ampollas, descamacin o distensin de la piel, incluso dentro de la boca signos y sntomas de presin sangunea baja, tales como Hondurasmareos, sensacin de Baileydesmayos o aturdimiento, cadas, cansancio o debilidad inusual dificultad para orinar o cambios en el volumen de orina color amarillento de los ojos o la piel Efectos secundarios que generalmente no requieren atencin mdica (debe informarlos a su mdico o a su profesional de la salud si persisten o si son molestos): estreimiento boca seca nuseas, vmito  cansancio Puede ser que esta lista no menciona todos los posibles efectos secundarios. Comunquese a su mdico por asesoramiento mdico Hewlett-Packardsobre los efectos secundarios. Usted puede informar los efectos secundarios a la FDA por telfono al 1-800-FDA-1088. Dnde  debo guardar mi medicina? Mantenga fuera del alcance de los nios. Existe la posibilidad de abusar de PPL Corporationeste medicamento. Mantenga su medicamento en un lugar seguro para protegerlo contra robos. No comparta este medicamento con nadie. Es peligroso vender o Restaurant manager, fast foodregalar este medicamento, y est prohibido por la ley. Este medicamento puede causar Neomia Dearuna sobredosis accidental y la muerte si lo toman otros adultos, nios o Neurosurgeonmascotas. Mezcle todo el medicamento sin usar con una sustancia como piedras sanitarias para gatos o posos (residuos) de caf. Luego deseche el Science Applications Internationalmedicamento en un recipiente cerrado, como una bolsa sellada o una lata de caf con tapa. No utilice este medicamento despus de la fecha de vencimiento. Guarde a Sanmina-SCItemperatura ambiente, entre 15 y 30 grados Celsius (59 y 4686 grados Fahrenheit). ATENCIN: Este folleto es un resumen. Puede ser que no cubra toda la posible informacin. Si usted tiene preguntas acerca de esta medicina, consulte con su mdico, su farmacutico o su profesional de Radiographer, therapeuticla salud.  2018 Elsevier/Gold Standard (2017-01-09 00:00:00)

## 2017-11-05 NOTE — Progress Notes (Signed)
Subjective:  Patient ID: [name] [name], female    DOB: 05/15/1951  Age: 66 y.o. MRN: [id number]  CC: f/u ankle pain  HPI  [name] Vazquez-Garciais a 66 y.o.femalewith a PMH of HTN, HLD, osteoporosis, cataract, astigmatism, hypermetropia, presbyopia, pterygium, and peroneal tendinitis bilaterally presents to f/u on bilateral ankle pain and deformity. Has seen the orthopedic specialist and will need surgery. She was diagnosed with post traumatic osteoarthritis. She is to f/u with orthopedics in approximately two weeks and is looking forward to surgical correction. Continues with pain that is minimally and temporarily relieved with tylenol #3. However, pain usually persists and is severe.     Outpatient Medications Prior to Visit  Medication Sig Dispense Refill  . Calcium Carb-Cholecalciferol (CALTRATE 600+D) 600-800 MG-UNIT TABS Take 2 tablets by mouth daily. 60 tablet 11  . lisinopril-hydrochlorothiazide (ZESTORETIC) 20-12.5 MG tablet Take 2 tablets by mouth daily. 180 tablet 3  . lovastatin (MEVACOR) 20 MG tablet Take 2 tablets (40 mg total) by mouth at bedtime. 180 tablet 3  . meloxicam (MOBIC) 15 MG tablet Take 1 tablet (15 mg total) by mouth daily. 30 tablet 0   No facility-administered medications prior to visit.      ROS Review of Systems  Constitutional: Negative for chills, fever and malaise/fatigue.  Eyes: Negative for blurred vision.  Respiratory: Negative for shortness of breath.   Cardiovascular: Negative for chest pain and palpitations.  Gastrointestinal: Negative for abdominal pain and nausea.  Genitourinary: Negative for dysuria and hematuria.  Musculoskeletal: Positive for joint pain. Negative for myalgias.  Skin: Negative for rash.  Neurological: Negative for tingling and headaches.  Psychiatric/Behavioral: Negative for depression. The patient is not nervous/anxious.     Objective:  BP (!) 142/80 (BP Location: Left Arm, Patient Position: Sitting, Cuff  Size: Large)   Pulse 65   Temp 97.9 F (36.6 C) (Oral)   Wt 186 lb 12.8 oz (84.7 kg)   SpO2 98%   BMI 39.04 kg/m   BP/Weight 11/05/2017 09/10/2017 08/22/2017  Systolic BP 142 149 156  Diastolic BP 80 88 69  Wt. (Lbs) 186.8 188.8 -  BMI 39.04 39.46 -      Physical Exam  Constitutional: She is oriented to person, place, and time.  Well developed, overweight, NAD, polite  HENT:  Head: Normocephalic and atraumatic.  Eyes: No scleral icterus.  Pulmonary/Chest: Effort normal.  Abdominal: Soft. Bowel sounds are normal. There is no tenderness.  Musculoskeletal: She exhibits no edema.  Deformity of the medial aspect of ankle, right ankle greater than left ankle  Neurological: She is alert and oriented to person, place, and time. No cranial nerve deficit. Coordination normal.  Skin: Skin is warm and dry. No rash noted. No erythema. No pallor.  Psychiatric: She has a normal mood and affect. Her behavior is normal. Thought content normal.  Vitals reviewed.    Assessment & Plan:    1. Post-traumatic osteoarthritis of right ankle - Begin Norco 5-325 mg, take half tablet BID PRN, #30, zero refills - Keep appointment with orthopedic specialist  2. Deformity of right ankle joint - Refill meloxicam (MOBIC) 15 MG tablet; Take 1 tablet (15 mg total) daily by mouth.  Dispense: 30 tablet; Refill: 0  3. Deformity of left ankle joint - Refill meloxicam (MOBIC) 15 MG tablet; Take 1 tablet (15 mg total) daily by mouth.  Dispense: 30 tablet; Refill: 0  4. Screening for breast cancer - [place] DIGITAL SCREENING BILATERAL; Future  5. Need for hepatitis C screening test -  Hepatitis c antibody (reflex)   Meds ordered this encounter  Medications  . HYDROcodone-acetaminophen (NORCO) 5-325 MG tablet    Sig: Take 0.5 tablets every 12 (twelve) hours as needed by mouth for moderate pain.    Dispense:  30 tablet    Refill:  0    Order Specific Question:   Supervising Provider    Answer:   Quentin AngstJEGEDE,  OLUGBEMIGA E [id number]  . meloxicam (MOBIC) 15 MG tablet    Sig: Take 1 tablet (15 mg total) daily by mouth.    Dispense:  30 tablet    Refill:  0    Order Specific Question:   Supervising Provider    Answer:   Quentin AngstJEGEDE, OLUGBEMIGA E [id number]]    Follow-up: Return if symptoms worsen or fail to improve, for ankle pain.   Loletta Specteroger David Laquitha Heslin PA

## 2017-11-06 LAB — HCV COMMENT:

## 2017-11-06 LAB — HEPATITIS C ANTIBODY (REFLEX): HCV Ab: <0.1 s/co ratio (ref 0.0–0.9)

## 2017-11-07 ENCOUNTER — Telehealth (INDEPENDENT_AMBULATORY_CARE_PROVIDER_SITE_OTHER): Payer: Self-pay

## 2017-11-07 NOTE — Telephone Encounter (Signed)
-----   Message from Loletta Specteroger David Gomez, PA-C sent at 11/07/2017  8:57 AM EST ----- I have called both her numbers with no answer. Please try calling again later. Hepatitis C negative.

## 2017-11-07 NOTE — Telephone Encounter (Signed)
Informed patient of negative Hep C using pacific interpreters [phone]([phone]). Maryjean Mornempestt S Raksha Wolfgang, CMA

## 2017-12-11 NOTE — Progress Notes (Signed)
Email to interpreting services requesting interpreter for pre-admission appointment 12/17/2017 1400 and day of surgery arrival time request [phone]

## 2017-12-15 NOTE — Pre-Procedure Instructions (Addendum)
[name] [name]  12/15/2017      [facility] [place] [place] [address] Phone: [phone] Fax: [phone]    Your procedure is scheduled on Wednesday, December 24, 2017  [facility]" at 10:20AM   Call this number if you have problems the morning of surgery:  [phone]   Remember:  Do not eat food or drink liquids after midnight.  Take these medicines the morning of surgery with A SIP OF WATER: If needed HYDROcodone-acetaminophen (NORCO) for pain.  As of today,stop taking all Aspirins, Vitamins, Fish oils, and Herbal medications. Also stop all NSAIDS i.e. Advil, Ibuprofen, Motrin, Aleve, Anaprox, Naproxen, BC and Goody Powders. Includes: Meloxicam (MOBIC).   Do not wear jewelry, make-up or nail polish.  Do not wear lotions, powders, perfumes, or deodorant.  Do not shave 48 hours prior to surgery.    Do not bring valuables to the hospital.  [facility] for any belongings or valuables.  Contacts, dentures or bridgework may not be worn into surgery.  Leave your suitcase in the car.  After surgery it may be brought to your room.  For patients admitted to the hospital, discharge time will be determined by your treatment team.  Patients discharged the day of surgery will not be allowed to drive home.   Special instructions:   [place]- Preparing For Surgery  Before surgery, you can play an important role. Because skin is not sterile, your skin needs to be as free of germs as possible. You can reduce the number of germs on your skin by washing with CHG (chlorahexidine gluconate) Soap before surgery.  CHG is an antiseptic cleaner which kills germs and bonds with the skin to continue killing germs even after washing.  Please do not use if you have an allergy to CHG or antibacterial soaps. If your skin becomes reddened/irritated stop using  the CHG.  Do not shave (including legs and underarms) for at least 48 hours prior to first CHG shower. It is OK to shave your face.  Please follow these instructions carefully.   1. Shower the NIGHT BEFORE SURGERY and the MORNING OF SURGERY with CHG.   2. If you chose to wash your hair, wash your hair first as usual with your normal shampoo.  3. After you shampoo, rinse your hair and body thoroughly to remove the shampoo.  4. Use CHG as you would any other liquid soap. You can apply CHG directly to the skin and wash gently with a scrungie or a clean washcloth.   5. Apply the CHG Soap to your body ONLY FROM THE NECK DOWN.  Do not use on open wounds or open sores. Avoid contact with your eyes, ears, mouth and genitals (private parts). Wash Face and genitals (private parts)  with your normal soap.  6. Wash thoroughly, paying special attention to the area where your surgery will be performed.  7. Thoroughly rinse your body with warm water from the neck down.  8. DO NOT shower/wash with your normal soap after using and rinsing off the CHG Soap.  9. Pat yourself dry with a CLEAN TOWEL.  10. Wear CLEAN PAJAMAS to bed the night before surgery, wear comfortable clothes the morning of surgery  11. Place CLEAN SHEETS on your bed the night of your first shower and DO NOT SLEEP WITH PETS.  Day of Surgery: Do not  apply any deodorants/lotions. Please wear clean clothes to the hospital/surgery center.    Please read over the following fact sheets that you were given. Pain Booklet, Coughing and Deep Breathing, MRSA Information and Surgical Site Infection Prevention

## 2017-12-17 ENCOUNTER — Encounter (HOSPITAL_COMMUNITY)
Admission: RE | Admit: 2017-12-17 | Discharge: 2017-12-17 | Disposition: A | Discharge status: Home / Self Care | Payer: Self-pay | Source: Ambulatory Visit | Attending: Orthopedic Surgery | Admitting: Orthopedic Surgery

## 2017-12-17 ENCOUNTER — Encounter (HOSPITAL_COMMUNITY): Payer: Self-pay

## 2017-12-17 ENCOUNTER — Other Ambulatory Visit: Payer: Self-pay

## 2017-12-17 ENCOUNTER — Other Ambulatory Visit (INDEPENDENT_AMBULATORY_CARE_PROVIDER_SITE_OTHER): Payer: Self-pay | Admitting: Orthopedic Surgery

## 2017-12-17 DIAGNOSIS — I10 Essential (primary) hypertension: Secondary | ICD-10-CM | POA: Insufficient documentation

## 2017-12-17 DIAGNOSIS — I447 Left bundle-branch block, unspecified: Secondary | ICD-10-CM | POA: Insufficient documentation

## 2017-12-17 DIAGNOSIS — Z01810 Encounter for preprocedural cardiovascular examination: Secondary | ICD-10-CM | POA: Insufficient documentation

## 2017-12-17 DIAGNOSIS — E785 Hyperlipidemia, unspecified: Secondary | ICD-10-CM | POA: Insufficient documentation

## 2017-12-17 DIAGNOSIS — Z01812 Encounter for preprocedural laboratory examination: Secondary | ICD-10-CM | POA: Insufficient documentation

## 2017-12-17 DIAGNOSIS — Z79899 Other long term (current) drug therapy: Secondary | ICD-10-CM | POA: Insufficient documentation

## 2017-12-17 DIAGNOSIS — M19171 Post-traumatic osteoarthritis, right ankle and foot: Secondary | ICD-10-CM

## 2017-12-17 HISTORY — DX: Personal history of urinary calculi: Z87.442

## 2017-12-17 HISTORY — DX: Primary osteoarthritis, right ankle and foot: M19.071

## 2017-12-17 LAB — CBC
HEMATOCRIT: 37.9 % (ref 36.0–46.0)
HEMOGLOBIN: 12.7 g/dL (ref 12.0–15.0)
MCH: 31.9 pg (ref 26.0–34.0)
MCHC: 33.5 g/dL (ref 30.0–36.0)
MCV: 95.2 fL (ref 78.0–100.0)
Platelets: 277 10*3/uL (ref 150–400)
RBC: 3.98 MIL/uL (ref 3.87–5.11)
RDW: 12.3 % (ref 11.5–15.5)
WBC: 6.4 10*3/uL (ref 4.0–10.5)

## 2017-12-17 LAB — COMPREHENSIVE METABOLIC PANEL
ALBUMIN: 3.7 g/dL (ref 3.5–5.0)
ALK PHOS: 69 U/L (ref 38–126)
ALT: 20 U/L (ref 14–54)
ANION GAP: 7 (ref 5–15)
AST: 26 U/L (ref 15–41)
BILIRUBIN TOTAL: 0.7 mg/dL (ref 0.3–1.2)
BUN: 18 mg/dL (ref 6–20)
CALCIUM: 9.3 mg/dL (ref 8.9–10.3)
CO2: 23 mmol/L (ref 22–32)
Chloride: 107 mmol/L (ref 101–111)
Creatinine, Ser: 0.66 mg/dL (ref 0.44–1.00)
GFR calc Af Amer: >60 mL/min (ref >60)
GLUCOSE: 101 mg/dL — AB (ref 65–99)
POTASSIUM: 3.7 mmol/L (ref 3.5–5.1)
Sodium: 137 mmol/L (ref 135–145)
TOTAL PROTEIN: 6.9 g/dL (ref 6.5–8.1)

## 2017-12-17 LAB — SURGICAL PCR SCREEN
MRSA, PCR: NEGATIVE
STAPHYLOCOCCUS AUREUS: NEGATIVE

## 2017-12-17 LAB — PROTIME-INR
INR: 0.98
PROTHROMBIN TIME: 12.9 s (ref 11.4–15.2)

## 2017-12-17 LAB — APTT: aPTT: 29 seconds (ref 24–36)

## 2017-12-17 NOTE — Progress Notes (Signed)
PCP - Dr. Sindy Messingoger [facility]  Cardiologist - Denies  Chest x-ray - Denies  EKG - 12/17/17  Stress Test - Denies  ECHO - Denies  Cardiac Cath - Denies  Sleep Study - No CPAP - None  LABS- 12/17/17: CBC, CMP, PT, PTT   Anesthesia- No  Pt denies having chest pain, sob, or fever at this time. All instructions explained to the pt, with a verbal understanding of the material. Pt agrees to go over the instructions while at home for a better understanding. The opportunity to ask questions was provided.

## 2017-12-22 NOTE — Progress Notes (Signed)
Anesthesia Chart Review:  Pt is spanish speaking only and requires an interpreter.   Pt is a 66 year old female scheduled for R tibiocalcaneal fusion on 12/24/2017 with Aldean BakerMarcus Duda, MD  - PCP is Sindy Messingoger Gomez, PA - Pt was evaluated by cardiologsit Willa RoughJeffrey Katz, MD in 2008 for HTN, L shoulder discomfort.  Pt had LBBB at that time. Last office visit 11/12/07; f/u prn recommended.   PMH includes:  LBBB (2008), HTN, hyperlipidemia. Never smoker. BMI 39  Medications include: Lisinopril-HCTZ, lovastatin  No VS taken at pre-admission testing.   Preoperative labs reviewed.   EKG 12/17/17:  - Sinus bradycardia (56 bpm). LBBB.  - LBBB is present on EKG dating back to 2008  Echo 11/04/07:  - Overall left ventricular systolic function was normal. There were no left ventricular regional wall motion abnormalities. - There was mild aortic valvular regurgitation. - There was mild mitral annular calcification. There was mild mitral valvular regurgitation.  If no changes, I anticipate pt can proceed with surgery as scheduled.   Rica Mastngela Naysha Sholl, FNP-BC [facility]Anesthesiology Phone: [phone] 12/22/2017 4:32 PM

## 2017-12-24 ENCOUNTER — Inpatient Hospital Stay (HOSPITAL_COMMUNITY): Payer: Self-pay | Admitting: Emergency Medicine

## 2017-12-24 ENCOUNTER — Encounter (HOSPITAL_COMMUNITY): Payer: Self-pay | Admitting: *Deleted

## 2017-12-24 ENCOUNTER — Inpatient Hospital Stay (HOSPITAL_COMMUNITY)
Admission: RE | Admit: 2017-12-24 | Discharge: 2017-12-26 | DRG: 494 | Disposition: A | Discharge status: Home / Self Care | Payer: Self-pay | Source: Ambulatory Visit | Attending: Orthopedic Surgery | Admitting: Orthopedic Surgery

## 2017-12-24 ENCOUNTER — Encounter (HOSPITAL_COMMUNITY)
Admission: RE | Disposition: A | Discharge status: Home / Self Care | Payer: Self-pay | Source: Ambulatory Visit | Attending: Orthopedic Surgery

## 2017-12-24 ENCOUNTER — Other Ambulatory Visit: Payer: Self-pay

## 2017-12-24 DIAGNOSIS — E785 Hyperlipidemia, unspecified: Secondary | ICD-10-CM | POA: Diagnosis present

## 2017-12-24 DIAGNOSIS — I447 Left bundle-branch block, unspecified: Secondary | ICD-10-CM | POA: Diagnosis present

## 2017-12-24 DIAGNOSIS — M12571 Traumatic arthropathy, right ankle and foot: Principal | ICD-10-CM | POA: Diagnosis present

## 2017-12-24 DIAGNOSIS — Z9049 Acquired absence of other specified parts of digestive tract: Secondary | ICD-10-CM

## 2017-12-24 DIAGNOSIS — M810 Age-related osteoporosis without current pathological fracture: Secondary | ICD-10-CM | POA: Diagnosis present

## 2017-12-24 DIAGNOSIS — H524 Presbyopia: Secondary | ICD-10-CM | POA: Diagnosis present

## 2017-12-24 DIAGNOSIS — E7800 Pure hypercholesterolemia, unspecified: Secondary | ICD-10-CM | POA: Diagnosis present

## 2017-12-24 DIAGNOSIS — H5203 Hypermetropia, bilateral: Secondary | ICD-10-CM | POA: Diagnosis present

## 2017-12-24 DIAGNOSIS — H52203 Unspecified astigmatism, bilateral: Secondary | ICD-10-CM | POA: Diagnosis present

## 2017-12-24 DIAGNOSIS — H11003 Unspecified pterygium of eye, bilateral: Secondary | ICD-10-CM | POA: Diagnosis present

## 2017-12-24 DIAGNOSIS — Z87442 Personal history of urinary calculi: Secondary | ICD-10-CM

## 2017-12-24 DIAGNOSIS — M19171 Post-traumatic osteoarthritis, right ankle and foot: Secondary | ICD-10-CM

## 2017-12-24 DIAGNOSIS — I10 Essential (primary) hypertension: Secondary | ICD-10-CM | POA: Diagnosis present

## 2017-12-24 DIAGNOSIS — Z981 Arthrodesis status: Secondary | ICD-10-CM

## 2017-12-24 HISTORY — PX: ANKLE FUSION: SHX5718

## 2017-12-24 HISTORY — PX: ANKLE FUSION: SHX881

## 2017-12-24 SURGERY — ANKLE FUSION
Anesthesia: General | Site: Ankle | Laterality: Right

## 2017-12-24 MED ORDER — MIDAZOLAM HCL 2 MG/2ML IJ SOLN
INTRAMUSCULAR | Status: AC
  Administered 2017-12-24: 1 mg via INTRAVENOUS
  Filled 2017-12-24: qty 2

## 2017-12-24 MED ORDER — CHLORHEXIDINE GLUCONATE 4 % EX LIQD: 60.0000 mL | Freq: Once | CUTANEOUS | Status: DC

## 2017-12-24 MED ORDER — LIDOCAINE 2% (20 MG/ML) 5 ML SYRINGE
INTRAMUSCULAR | Status: AC
  Filled 2017-12-24: qty 5

## 2017-12-24 MED ORDER — OXYCODONE HCL 5 MG PO TABS
5.0000 mg | ORAL_TABLET | ORAL | Status: DC | PRN
  Administered 2017-12-24 – 2017-12-25 (×2): 5 mg via ORAL
  Filled 2017-12-24 (×2): qty 1

## 2017-12-24 MED ORDER — PROPOFOL 10 MG/ML IV BOLUS
INTRAVENOUS | Status: AC
  Filled 2017-12-24: qty 20

## 2017-12-24 MED ORDER — PHENYLEPHRINE 40 MCG/ML (10ML) SYRINGE FOR IV PUSH (FOR BLOOD PRESSURE SUPPORT)
PREFILLED_SYRINGE | INTRAVENOUS | Status: AC
  Filled 2017-12-24: qty 10

## 2017-12-24 MED ORDER — FENTANYL CITRATE (PF) 100 MCG/2ML IJ SOLN
INTRAMUSCULAR | Status: AC
  Administered 2017-12-24: 50 ug via INTRAVENOUS
  Filled 2017-12-24: qty 2

## 2017-12-24 MED ORDER — ONDANSETRON HCL 4 MG/2ML IJ SOLN: 4.0000 mg | Freq: Once | INTRAMUSCULAR | Status: DC | PRN

## 2017-12-24 MED ORDER — DEXAMETHASONE SODIUM PHOSPHATE 10 MG/ML IJ SOLN
INTRAMUSCULAR | Status: DC | PRN
  Administered 2017-12-24: 5 mg via INTRAVENOUS

## 2017-12-24 MED ORDER — DOCUSATE SODIUM 100 MG PO CAPS
100.0000 mg | ORAL_CAPSULE | Freq: Two times a day (BID) | ORAL | Status: DC
  Administered 2017-12-25 – 2017-12-26 (×4): 100 mg via ORAL
  Filled 2017-12-24 (×4): qty 1

## 2017-12-24 MED ORDER — FENTANYL CITRATE (PF) 100 MCG/2ML IJ SOLN: 25.0000 ug | INTRAMUSCULAR | Status: DC | PRN

## 2017-12-24 MED ORDER — ASPIRIN EC 325 MG PO TBEC
325.0000 mg | DELAYED_RELEASE_TABLET | Freq: Every day | ORAL | Status: DC
  Administered 2017-12-25 (×2): 325 mg via ORAL
  Filled 2017-12-24 (×2): qty 1

## 2017-12-24 MED ORDER — 0.9 % SODIUM CHLORIDE (POUR BTL) OPTIME
TOPICAL | Status: DC | PRN
  Administered 2017-12-24: 1000 mL

## 2017-12-24 MED ORDER — FENTANYL CITRATE (PF) 250 MCG/5ML IJ SOLN
INTRAMUSCULAR | Status: AC
  Filled 2017-12-24: qty 5

## 2017-12-24 MED ORDER — FENTANYL CITRATE (PF) 250 MCG/5ML IJ SOLN
INTRAMUSCULAR | Status: DC | PRN
  Administered 2017-12-24 (×2): 25 ug via INTRAVENOUS

## 2017-12-24 MED ORDER — ONDANSETRON HCL 4 MG PO TABS: 4.0000 mg | ORAL_TABLET | Freq: Four times a day (QID) | ORAL | Status: DC | PRN

## 2017-12-24 MED ORDER — LACTATED RINGERS IV SOLN
INTRAVENOUS | Status: DC
  Administered 2017-12-24 (×2): via INTRAVENOUS

## 2017-12-24 MED ORDER — OXYCODONE HCL 5 MG PO TABS
10.0000 mg | ORAL_TABLET | ORAL | Status: DC | PRN
  Administered 2017-12-25 – 2017-12-26 (×2): 10 mg via ORAL
  Filled 2017-12-24 (×3): qty 2

## 2017-12-24 MED ORDER — PROPOFOL 10 MG/ML IV BOLUS
INTRAVENOUS | Status: DC | PRN
  Administered 2017-12-24: 130 mg via INTRAVENOUS

## 2017-12-24 MED ORDER — LIDOCAINE 2% (20 MG/ML) 5 ML SYRINGE
INTRAMUSCULAR | Status: DC | PRN
  Administered 2017-12-24: 40 mg via INTRAVENOUS

## 2017-12-24 MED ORDER — LISINOPRIL 40 MG PO TABS
40.0000 mg | ORAL_TABLET | Freq: Every day | ORAL | Status: DC
  Administered 2017-12-25 – 2017-12-26 (×2): 40 mg via ORAL
  Filled 2017-12-24 (×2): qty 1

## 2017-12-24 MED ORDER — FENTANYL CITRATE (PF) 100 MCG/2ML IJ SOLN
50.0000 ug | Freq: Once | INTRAMUSCULAR | Status: AC
  Administered 2017-12-24: 50 ug via INTRAVENOUS

## 2017-12-24 MED ORDER — PHENYLEPHRINE 40 MCG/ML (10ML) SYRINGE FOR IV PUSH (FOR BLOOD PRESSURE SUPPORT)
PREFILLED_SYRINGE | INTRAVENOUS | Status: DC | PRN
  Administered 2017-12-24: 80 ug via INTRAVENOUS

## 2017-12-24 MED ORDER — METOCLOPRAMIDE HCL 5 MG PO TABS: 5.0000 mg | ORAL_TABLET | Freq: Three times a day (TID) | ORAL | Status: DC | PRN

## 2017-12-24 MED ORDER — OXYCODONE HCL 5 MG/5ML PO SOLN: 5.0000 mg | Freq: Once | ORAL | Status: DC | PRN

## 2017-12-24 MED ORDER — BISACODYL 10 MG RE SUPP: 10.0000 mg | Freq: Every day | RECTAL | Status: DC | PRN

## 2017-12-24 MED ORDER — CEFAZOLIN SODIUM-DEXTROSE 2-4 GM/100ML-% IV SOLN
2.0000 g | INTRAVENOUS | Status: AC
  Administered 2017-12-24: 2 g via INTRAVENOUS
  Filled 2017-12-24: qty 100

## 2017-12-24 MED ORDER — ONDANSETRON HCL 4 MG/2ML IJ SOLN
INTRAMUSCULAR | Status: DC | PRN
  Administered 2017-12-24: 4 mg via INTRAVENOUS

## 2017-12-24 MED ORDER — MIDAZOLAM HCL 5 MG/5ML IJ SOLN
INTRAMUSCULAR | Status: DC | PRN
  Administered 2017-12-24: 2 mg via INTRAVENOUS

## 2017-12-24 MED ORDER — ACETAMINOPHEN 650 MG RE SUPP: 650.0000 mg | RECTAL | Status: DC | PRN

## 2017-12-24 MED ORDER — ONDANSETRON HCL 4 MG/2ML IJ SOLN
INTRAMUSCULAR | Status: AC
  Filled 2017-12-24: qty 2

## 2017-12-24 MED ORDER — EPHEDRINE SULFATE 50 MG/ML IJ SOLN
INTRAMUSCULAR | Status: DC | PRN
  Administered 2017-12-24 (×2): 10 mg via INTRAVENOUS
  Administered 2017-12-24: 15 mg via INTRAVENOUS
  Administered 2017-12-24: 10 mg via INTRAVENOUS

## 2017-12-24 MED ORDER — ACETAMINOPHEN 325 MG PO TABS
650.0000 mg | ORAL_TABLET | ORAL | Status: DC | PRN
  Administered 2017-12-24 – 2017-12-25 (×3): 650 mg via ORAL
  Filled 2017-12-24 (×3): qty 2

## 2017-12-24 MED ORDER — METHOCARBAMOL 1000 MG/10ML IJ SOLN: 500.0000 mg | Freq: Four times a day (QID) | INTRAVENOUS | Status: DC | PRN

## 2017-12-24 MED ORDER — OXYCODONE HCL 5 MG PO TABS: 5.0000 mg | ORAL_TABLET | Freq: Once | ORAL | Status: DC | PRN

## 2017-12-24 MED ORDER — HYDROCHLOROTHIAZIDE 25 MG PO TABS
25.0000 mg | ORAL_TABLET | Freq: Every day | ORAL | Status: DC
  Administered 2017-12-25 – 2017-12-26 (×2): 25 mg via ORAL
  Filled 2017-12-24 (×2): qty 1

## 2017-12-24 MED ORDER — METOCLOPRAMIDE HCL 5 MG/ML IJ SOLN: 5.0000 mg | Freq: Three times a day (TID) | INTRAMUSCULAR | Status: DC | PRN

## 2017-12-24 MED ORDER — ONDANSETRON HCL 4 MG/2ML IJ SOLN
4.0000 mg | Freq: Four times a day (QID) | INTRAMUSCULAR | Status: DC | PRN
  Administered 2017-12-26: 4 mg via INTRAVENOUS
  Filled 2017-12-24: qty 2

## 2017-12-24 MED ORDER — POLYETHYLENE GLYCOL 3350 17 G PO PACK: 17.0000 g | PACK | Freq: Every day | ORAL | Status: DC | PRN

## 2017-12-24 MED ORDER — HYDROMORPHONE HCL 1 MG/ML IJ SOLN: 1.0000 mg | INTRAMUSCULAR | Status: DC | PRN

## 2017-12-24 MED ORDER — MAGNESIUM CITRATE PO SOLN: 1.0000 | Freq: Once | ORAL | Status: DC | PRN

## 2017-12-24 MED ORDER — CEFAZOLIN SODIUM-DEXTROSE 2-4 GM/100ML-% IV SOLN
2.0000 g | Freq: Four times a day (QID) | INTRAVENOUS | Status: AC
  Administered 2017-12-24 – 2017-12-25 (×3): 2 g via INTRAVENOUS
  Filled 2017-12-24 (×3): qty 100

## 2017-12-24 MED ORDER — METHOCARBAMOL 500 MG PO TABS
500.0000 mg | ORAL_TABLET | Freq: Four times a day (QID) | ORAL | Status: DC | PRN
  Administered 2017-12-24 – 2017-12-26 (×3): 500 mg via ORAL
  Filled 2017-12-24 (×4): qty 1

## 2017-12-24 MED ORDER — DEXAMETHASONE SODIUM PHOSPHATE 10 MG/ML IJ SOLN
INTRAMUSCULAR | Status: AC
  Filled 2017-12-24: qty 1

## 2017-12-24 MED ORDER — PRAVASTATIN SODIUM 20 MG PO TABS
10.0000 mg | ORAL_TABLET | Freq: Every day | ORAL | Status: DC
  Administered 2017-12-24 – 2017-12-25 (×2): 10 mg via ORAL
  Filled 2017-12-24 (×2): qty 1

## 2017-12-24 MED ORDER — MIDAZOLAM HCL 2 MG/2ML IJ SOLN
INTRAMUSCULAR | Status: AC
  Filled 2017-12-24: qty 2

## 2017-12-24 MED ORDER — LISINOPRIL-HYDROCHLOROTHIAZIDE 20-12.5 MG PO TABS: 2.0000 | ORAL_TABLET | Freq: Every day | ORAL | Status: DC

## 2017-12-24 MED ORDER — MIDAZOLAM HCL 2 MG/2ML IJ SOLN
1.0000 mg | Freq: Once | INTRAMUSCULAR | Status: AC
  Administered 2017-12-24: 1 mg via INTRAVENOUS

## 2017-12-24 MED ORDER — SODIUM CHLORIDE 0.9 % IV SOLN
INTRAVENOUS | Status: DC
  Administered 2017-12-25: 01:00:00 via INTRAVENOUS

## 2017-12-24 SURGICAL SUPPLY — 51 items
BANDAGE ESMARK 6X9 LF (GAUZE/BANDAGES/DRESSINGS) ×1 IMPLANT
BIT DRILL CALIBRATED 4.2 (BIT) IMPLANT
BIT DRILL CALIBRATED 5.0 MM (BIT) IMPLANT
BLADE SAW SGTL HD 18.5X60.5X1. (BLADE) ×3 IMPLANT
BLADE SURG 10 STRL SS (BLADE) ×4 IMPLANT
BNDG CMPR 9X6 STRL LF SNTH (GAUZE/BANDAGES/DRESSINGS) ×1
BNDG COHESIVE 4X5 TAN STRL (GAUZE/BANDAGES/DRESSINGS) ×3 IMPLANT
BNDG ESMARK 6X9 LF (GAUZE/BANDAGES/DRESSINGS) ×3
BNDG GAUZE ELAST 4 BULKY (GAUZE/BANDAGES/DRESSINGS) ×4 IMPLANT
CAP END T25 HF ARTHRO NAIL EX (Cap) IMPLANT
COVER MAYO STAND STRL (DRAPES) ×2 IMPLANT
COVER SURGICAL LIGHT HANDLE (MISCELLANEOUS) ×4 IMPLANT
DRAPE OEC MINIVIEW 54X84 (DRAPES) ×2 IMPLANT
DRAPE U-SHAPE 47X51 STRL (DRAPES) ×3 IMPLANT
DRILL BIT CALIBRATED 4.2 (BIT) ×3
DRILL BIT CALIBRATED 5.0 MM (BIT) ×3
DRSG ADAPTIC 3X8 NADH LF (GAUZE/BANDAGES/DRESSINGS) ×3 IMPLANT
DRSG PAD ABDOMINAL 8X10 ST (GAUZE/BANDAGES/DRESSINGS) ×2 IMPLANT
DURAPREP 26ML APPLICATOR (WOUND CARE) ×3 IMPLANT
ELECT REM PT RETURN 9FT ADLT (ELECTROSURGICAL) ×3
ELECTRODE REM PT RTRN 9FT ADLT (ELECTROSURGICAL) ×1 IMPLANT
ENDCAP T25 HF ARTHRO NAIL EX (Cap) ×2 IMPLANT
GAUZE SPONGE 4X4 12PLY STRL (GAUZE/BANDAGES/DRESSINGS) ×3 IMPLANT
GLOVE BIOGEL PI IND STRL 9 (GLOVE) ×1 IMPLANT
GLOVE BIOGEL PI INDICATOR 9 (GLOVE) ×2
GLOVE SURG ORTHO 9.0 STRL STRW (GLOVE) ×3 IMPLANT
GOWN STRL REUS W/ TWL LRG LVL3 (GOWN DISPOSABLE) ×1 IMPLANT
GOWN STRL REUS W/ TWL XL LVL3 (GOWN DISPOSABLE) ×1 IMPLANT
GOWN STRL REUS W/TWL LRG LVL3 (GOWN DISPOSABLE) ×3
GOWN STRL REUS W/TWL XL LVL3 (GOWN DISPOSABLE) ×3
GUIDEWIRE 3.2X290 (WIRE) ×2 IMPLANT
GUIDEWIRE 3.2X400 (WIRE) ×2 IMPLANT
KIT BASIN OR (CUSTOM PROCEDURE TRAY) ×3 IMPLANT
KIT ROOM TURNOVER OR (KITS) ×3 IMPLANT
NAIL EX CANN ARTHRODESIS 10MM (Nail) ×2 IMPLANT
NS IRRIG 1000ML POUR BTL (IV SOLUTION) ×3 IMPLANT
PACK ORTHO EXTREMITY (CUSTOM PROCEDURE TRAY) ×3 IMPLANT
PAD ARMBOARD 7.5X6 YLW CONV (MISCELLANEOUS) ×6 IMPLANT
REAMER ROD DEEP FLUTE 2.5X950 (INSTRUMENTS) ×2 IMPLANT
SCREW CANN LOCK TI FT 5X30 (Screw) ×2 IMPLANT
SCREW LOCK STAR 6X60 (Screw) ×2 IMPLANT
SCREW LOCKING 6.0X68MM (Screw) ×2 IMPLANT
SPONGE LAP 18X18 X RAY DECT (DISPOSABLE) ×3 IMPLANT
SUCTION FRAZIER HANDLE 10FR (MISCELLANEOUS) ×2
SUCTION TUBE FRAZIER 10FR DISP (MISCELLANEOUS) ×1 IMPLANT
SUT ETHILON 2 0 PSLX (SUTURE) ×7 IMPLANT
TOWEL OR 17X24 6PK STRL BLUE (TOWEL DISPOSABLE) ×1 IMPLANT
TOWEL OR 17X26 10 PK STRL BLUE (TOWEL DISPOSABLE) ×3 IMPLANT
TUBE CONNECTING 12'X1/4 (SUCTIONS) ×1
TUBE CONNECTING 12X1/4 (SUCTIONS) ×2 IMPLANT
WATER STERILE IRR 1000ML POUR (IV SOLUTION) ×1 IMPLANT

## 2017-12-24 NOTE — Transfer of Care (Signed)
Immediate Anesthesia Transfer of Care Note  Patient: [name] [name]  Procedure(s) Performed: RIGHT TIBIOCALCANEAL FUSION (Right Ankle)  Patient Location: PACU  Anesthesia Type:General  Level of Consciousness: awake, alert  and patient cooperative  Airway & Oxygen Therapy: Patient Spontanous Breathing  Post-op Assessment: Report given to RN and Post -op Vital signs reviewed and stable  Post vital signs: Reviewed and stable  Last Vitals:  Vitals:   12/24/17 0939 12/24/17 1211  BP: (!) 113/51 120/75  Pulse: (!) 52 76  Resp: (!) 8 10  Temp:    SpO2: 99% 100%    Last Pain:  Vitals:   12/24/17 0848  TempSrc: Oral      Patients Stated Pain Goal: 3 (12/24/17 0853)  Complications: No apparent anesthesia complications

## 2017-12-24 NOTE — Anesthesia Preprocedure Evaluation (Addendum)
Anesthesia Evaluation  Patient identified by MRN, date of birth, ID band Patient awake    Reviewed: Allergy & Precautions, NPO status , Patient's Chart, lab work & pertinent test results  Airway Mallampati: II  TM Distance: >3 FB Neck ROM: Full    Dental  (+) Teeth Intact, Chipped,    Pulmonary    breath sounds clear to auscultation       Cardiovascular hypertension,  Rhythm:Regular Rate:Normal     Neuro/Psych    GI/Hepatic   Endo/Other    Renal/GU      Musculoskeletal   Abdominal   Peds  Hematology   Anesthesia Other Findings   Reproductive/Obstetrics                            Anesthesia Physical Anesthesia Plan  ASA: III  Anesthesia Plan: General   Post-op Pain Management:  Regional for Post-op pain   Induction:   PONV Risk Score and Plan: 1 and Ondansetron and Dexamethasone  Airway Management Planned: LMA  Additional Equipment:   Intra-op Plan:   Post-operative Plan:   Informed Consent: I have reviewed the patients History and Physical, chart, labs and discussed the procedure including the risks, benefits and alternatives for the proposed anesthesia with the patient or authorized representative who has indicated his/her understanding and acceptance.   Dental advisory given  Plan Discussed with: CRNA and Anesthesiologist  Anesthesia Plan Comments:         Anesthesia Quick Evaluation

## 2017-12-24 NOTE — Anesthesia Procedure Notes (Signed)
Anesthesia Regional Block: Popliteal block   Pre-Anesthetic Checklist: ,, timeout performed, Correct Patient, Correct Site, Correct Laterality, Correct Procedure, Correct Position, site marked, Risks and benefits discussed,  Surgical consent,  Pre-op evaluation,  At surgeon's request and post-op pain management  Laterality: Right  Prep: chloraprep       Needles:  Injection technique: Single-shot  Needle Type: Stimulator Needle - 80      Needle Gauge: 21     Additional Needles:   Procedures:, nerve stimulator,,, ultrasound used (permanent image in chart),,,,  Narrative:  Start time: 12/24/2017 9:10 AM End time: 12/24/2017 9:20 AM Injection made incrementally with aspirations every 5 mL.  Performed by: Personally   Additional Notes: 30 c 0.5% Bupivacaine with 1:200 epi injected easily

## 2017-12-24 NOTE — Op Note (Signed)
12/24/2017  11:58 AM  PATIENT:  [name] [name]    PRE-OPERATIVE DIAGNOSIS:  Osteoarthritis Right Ankle and Subtalar Joint  POST-OPERATIVE DIAGNOSIS:  Same  PROCEDURE:  RIGHT TIBIOCALCANEAL FUSION  SURGEON:  Nadara MustardMarcus V Evan Osburn, MD  PHYSICIAN ASSISTANT:None ANESTHESIA:   General  PREOPERATIVE INDICATIONS:  [name] is a  67 y.o. female with a diagnosis of Osteoarthritis Right Ankle and Subtalar Joint who failed conservative measures and elected for surgical management.    The risks benefits and alternatives were discussed with the patient preoperatively including but not limited to the risks of infection, bleeding, nerve injury, cardiopulmonary complications, the need for revision surgery, among others, and the patient was willing to proceed.  OPERATIVE IMPLANTS: Synthes 10 x 150 mm nail locked distally x2 proximally x1.  OPERATIVE FINDINGS: No signs of infection.  OPERATIVE PROCEDURE: Patient was brought to the operating room and underwent a general anesthetic.  After adequate levels of anesthesia were obtained patient was placed in the left lateral decubitus position with the right side up and the right lower extremity was prepped and draped into a sterile field a timeout was called.  A lateral incision was made over the fibula and the distal aspect of the fibula was resected Hohmann retractors were placed to protect the soft tissue bundles anteriorly and posteriorly.  A oscillating saw was used to resect the tibial talar joint perpendicular to the long axis of the tibia.  A guidewire was inserted from the calcaneus into the tibia C arm fluoroscopy verified alignment.  This was overreamed to 11 mm a 10 mm nail was inserted.  The distal aspect the nail was locked with 2 posterior to anterior screws and a lateral screw was placed to lock the construct proximally.  C-arm fluoroscopy verified alignment.  The wounds were irrigated with normal saline hemostasis was obtained the  incisions were closed using 2-0 nylon.  A sterile dressing was applied patient was extubated taken to the PACU in stable condition.   DISCHARGE PLANNING:  Antibiotic duration: 23-hour postoperatively.  Weightbearing: Touchdown weightbearing on the right.  Pain medication: As needed.  Dressing care/ Wound VAC: Keep dressing clean dry and intact.  Ambulatory devices: Walker for ambulation.  Discharge to: Discharge to home with family to care for her.  Follow-up: In the office 1 week post operative.

## 2017-12-24 NOTE — Progress Notes (Signed)
Orthopedic Tech Progress Note Patient Details:  Verdie ShireRamona Vazquez-Garcia 12/21/1951 [id number]  Ortho Devices Type of Ortho Device: Postop shoe/boot Ortho Device/Splint Location: RLE Ortho Device/Splint Interventions: Ordered, Application   Post Interventions Patient Tolerated: Well Instructions Provided: Care of device   Jennye MoccasinHughes, Zayvien Canning Craig 12/24/2017, 3:33 PM

## 2017-12-24 NOTE — Evaluation (Signed)
Physical Therapy Evaluation Patient Details Name: [name] [name] MRN: [id number] DOB: 1951/08/22 Today's Date: 12/24/2017   History of Present Illness  67 y.o. female s/p R Tibiocalcaneal Fusion 12/02. PMH includes: Osteoporosis, HTN, Arthritis  Clinical Impression  Patient is s/p above surgery resulting in functional limitations due to the deficits listed below (see PT Problem List). PTA, pt was mod independent with all forms of mobility, ambulating with cane or Rolator sometimes. Pt lives with husband who works during day, and has some family who can be avail PRN during day with 3 stairs to enter home. Upon eval, pt presents with post op weakness and NWB status that limits her mobility. Patient currently min guard level for OOB mobility and able to ambulate short distances with RW. Patient states strong preference to return home, which I feel will be appropriate with progression in subsequent PT sessions.  Patient will benefit from skilled PT to increase their independence and safety with mobility to allow discharge to the venue listed below.       Follow Up Recommendations Home health PT;Supervision/Assistance - 24 hour    Equipment Recommendations  Rolling walker with 5" wheels;3in1 (PT)    Recommendations for Other Services OT consult     Precautions / Restrictions Precautions Precautions: Fall Precaution Comments: TDWB RLE Restrictions Weight Bearing Restrictions: Yes RLE Weight Bearing: Touchdown weight bearing Other Position/Activity Restrictions: Cam Walker for OOB      Mobility  Bed Mobility Overal bed mobility: Needs Assistance Bed Mobility: Supine to Sit     Supine to sit: Supervision     General bed mobility comments: Supervision for safety, able to supine to sit without physical assistance.   Transfers Overall transfer level: Needs assistance Equipment used: Rolling walker (2 wheeled) Transfers: Sit to/from Stand Sit to Stand: Min guard          General transfer comment: Min Guard for safety, patient powering up by herself. Cues for hand placement with RW and safety, WB status.   Ambulation/Gait Ambulation/Gait assistance: Min guard Ambulation Distance (Feet): 20 Feet Assistive device: Rolling walker (2 wheeled) Gait Pattern/deviations: Step-to pattern;Antalgic Gait velocity: decreased   General Gait Details: Min guard for ambulation, cues for techiqnue, Patient utilizing hop-to gait adhereing to WB status after cues. able to ambulate around hospital room x2 before fatiguing.   Stairs            Wheelchair Mobility    Modified Rankin (Stroke Patients Only)       Balance Overall balance assessment: Needs assistance Sitting-balance support: Feet unsupported;No upper extremity supported Sitting balance-Leahy Scale: Good     Standing balance support: During functional activity;Bilateral upper extremity supported Standing balance-Leahy Scale: Fair                               Pertinent Vitals/Pain Pain Assessment: No/denies pain    Home Living Family/patient expects to be discharged to:: Private residence Living Arrangements: Spouse/significant other Available Help at Discharge: Family;Available PRN/intermittently Type of Home: House Home Access: Stairs to enter Entrance Stairs-Rails: Can reach both Entrance Stairs-Number of Steps: 3 Home Layout: One level Home Equipment: Cane - quad      Prior Function Level of Independence: Independent with assistive device(s)         Comments: Ambulated with cane or rolator sometimes.      Hand Dominance        Extremity/Trunk Assessment   Upper Extremity Assessment Upper Extremity Assessment:  [facility] tasks assessed    Lower Extremity Assessment Lower Extremity Assessment: Overall WFL for tasks assessed(R Ankle unable to assess. )       Communication   Communication: No difficulties;Prefers language other than English(Daughter  for translation )  Cognition Arousal/Alertness: Awake/alert Behavior During Therapy: WFL for tasks assessed/performed Overall Cognitive Status: Within Functional Limits for tasks assessed                                        General Comments      Exercises     Assessment/Plan    PT Assessment Patient needs continued PT services  PT Problem List Decreased strength;Decreased range of motion;Decreased activity tolerance;Decreased balance;Decreased knowledge of use of DME;Decreased safety awareness       PT Treatment Interventions DME instruction;Gait training;Stair training;Functional mobility training;Therapeutic activities;Therapeutic exercise    PT Goals (Current goals can be found in the Care Plan section)  Acute Rehab PT Goals Patient Stated Goal: return home PT Goal Formulation: With patient/family Time For Goal Achievement: 12/31/17 Potential to Achieve Goals: Fair    Frequency Min 6X/week   Barriers to discharge Decreased caregiver support husband works during day. daughter avail PRN locally    Co-evaluation               AM-PAC PT "6 Clicks" Daily Activity  Outcome Measure Difficulty turning over in bed (including adjusting bedclothes, sheets and blankets)?: A Little Difficulty moving from lying on back to sitting on the side of the bed? : A Little Difficulty sitting down on and standing up from a chair with arms (e.g., wheelchair, bedside commode, etc,.)?: A Little Help needed moving to and from a bed to chair (including a wheelchair)?: A Little Help needed walking in hospital room?: A Little Help needed climbing 3-5 steps with a railing? : A Lot 6 Click Score: 17    End of Session Equipment Utilized During Treatment: Gait belt Activity Tolerance: Patient tolerated treatment well Patient left: in chair;with call bell/phone within reach;with family/visitor present Nurse Communication: Mobility status PT Visit Diagnosis: Unsteadiness on  feet (R26.81);Other abnormalities of gait and mobility (R26.89);Pain;Difficulty in walking, not elsewhere classified (R26.2) Pain - Right/Left: Right Pain - part of body: Ankle and joints of foot    Time: [id number] PT Time Calculation (min) (ACUTE ONLY): 42 min   Charges:   PT Evaluation $PT Eval Low Complexity: 1 Low PT Treatments $Gait Training: 8-22 mins $Therapeutic Activity: 8-22 mins   PT G Codes:        [name] GrandchildSean Jhordan [name], PT, DPT Acute Rehab Services Pager: [phone]    [name] [name] 12/24/2017, 5:33 PM

## 2017-12-24 NOTE — Anesthesia Procedure Notes (Signed)
Procedure Name: LMA Insertion Date/Time: 12/24/2017 11:14 AM Performed by: Army FossaPulliam, Aram Domzalski Dane, CRNA Pre-anesthesia Checklist: Patient identified, Emergency Drugs available, Suction available and Patient being monitored Patient Re-evaluated:Patient Re-evaluated prior to induction Oxygen Delivery Method: Circle System Utilized Preoxygenation: Pre-oxygenation with 100% oxygen Induction Type: IV induction Ventilation: Mask ventilation without difficulty LMA: LMA inserted LMA Size: 4.0 Number of attempts: 1 Airway Equipment and Method: Bite block Placement Confirmation: positive ETCO2 Tube secured with: Tape Dental Injury: Teeth and Oropharynx as per pre-operative assessment

## 2017-12-24 NOTE — H&P (Signed)
[name] [name] is an 67 y.o. female.   Chief Complaint: Patient is a 67 year old woman with traumatic arthritis of the ankle and subtalar joint.  Patient complains of pain with activities of daily living. 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  Patient has had progressive pronation and valgus with a fixed hindfoot with degenerative arthritic pain in the ankle and subtalar joint.  Patient has tried custom orthotics extra-depth shoes stabilizing braces all without relief.  She states she cannot perform her activities of daily living due to pain.  She states she does not have pain over the talonavicular joint region.    Past Medical History:  Diagnosis Date  . Allergy    spring pollen  . Arthritis   . Astigmatism, 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  . Cataract 01/28/2017  . High cholesterol   . History of kidney stones   . 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 Hypertension   . Osteoarthritis of ankle, right    and Subtalar Joint  . Osteoporosis   . Presbyopia of both eyes 01/28/2017  . Pterygium eye, 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    Past Surgical History:  Procedure Laterality Date  . CHOLECYSTECTOMY      No family history on file. Social History:  reports that  has never smoked. she has never used smokeless tobacco. She reports that she does not drink alcohol or use drugs.  Allergies: No Known Allergies  No medications prior to admission.    No results found for this or any previous visit (from the past 48 hour(s)). No results found.  Review of Systems  All other systems reviewed and are negative.   There were no vitals taken for this visit. Physical Exam  Patient is alert, oriented, no adenopathy, well-dressed, normal affect, normal respiratory effort. Examination patient has a good dorsalis pedis and posterior tibial pulse she has an antalgic gait.  She has a fixed valgus hindfoot with no subtalar motion she is tender to palpation of  the sinus Tarsi.  She has limited range of motion of the ankle and has tenderness to palpation anteriorly over the ankle.  The posterior tibial tendon and the talonavicular joint which are prominent are nontender to palpation.  Review of the CT scan shows advanced degenerative changes of the subtalar and tibiotalar joint.  There is some mild arthritic changes of the talonavicular joint.   Assessment/Plan 1. Post-traumatic osteoarthritis, right ankle and foot     Plan: Discussed with the patient with her advanced traumatic arthritis of the ankle and subtalar joint that her only option would be to proceed with a fusion from the calcaneus to the talus to the tibia.  Discussed that with her arthritic changes at the talonavicular joint she would be at increased risk of pain through the midfoot.  Discussed that further fusions would be more symptomatic than limiting fusion to the tibial calcaneal joint.  Risks and benefits were discussed including infection neurovascular injury persistent pain need for additional surgery.  Discussed the patient will need to be off her foot for 2 months.  Patient states she understands she states she would like to proceed at this time.     Nadara MustardMarcus V Duda, MD 12/24/2017, 7:17 AM

## 2017-12-24 NOTE — Progress Notes (Signed)
Orthopedic Tech Progress Note Patient Details:  [name] [name] 12/20/1951 [id number]  Ortho Devices Type of Ortho Device: Postop shoe/boot Ortho Device/Splint Location: RLE Ortho Device/Splint Interventions: Ordered, Application   Post Interventions Patient Tolerated: Well Instructions Provided: Care of device   Jennye MoccasinHughes, [name] [name] 12/24/2017, 3:32 PM

## 2017-12-24 NOTE — Anesthesia Postprocedure Evaluation (Signed)
Anesthesia Post Note  Patient: [name]  Procedure(s) Performed: RIGHT TIBIOCALCANEAL FUSION (Right Ankle)     Patient location during evaluation: PACU Anesthesia Type: General and Regional Level of consciousness: awake, awake and alert and oriented Pain management: pain level controlled Vital Signs Assessment: post-procedure vital signs reviewed and stable Respiratory status: spontaneous breathing, nonlabored ventilation and respiratory function stable Cardiovascular status: blood pressure returned to baseline Postop Assessment: no headache Anesthetic complications: no    Last Vitals:  Vitals:   12/24/17 1311 12/24/17 1315  BP: (!) 119/55 123/60  Pulse: 70 65  Resp: 18 11  Temp:  36.7 C  SpO2: 100% 100%    Last Pain:  Vitals:   12/24/17 1315  TempSrc: Oral                 Taliya Mcclard COKER

## 2017-12-25 ENCOUNTER — Encounter (HOSPITAL_COMMUNITY): Payer: Self-pay | Admitting: Orthopedic Surgery

## 2017-12-25 NOTE — Progress Notes (Signed)
Physical Therapy Treatment Patient Details Name: [name] [name] MRN: [id number] DOB: 1951/11/28 Today's Date: 12/25/2017    History of Present Illness 67 y.o. female s/p R Tibiocalcaneal Fusion 12/02. PMH includes: Osteoporosis, HTN, Arthritis    PT Comments    Patient progressing well with therapy. Session focused on improving activity tolerance and gait training. Patient demonstrating increased independence with transfers but requires some reinforcement of safety and sequencing with sit to stand and RW use. Plan to visit patient this afternoon to introduce stair training.     Follow Up Recommendations  Home health PT;Supervision/Assistance - 24 hour     Equipment Recommendations  Rolling walker with 5" wheels;3in1 (PT)    Recommendations for Other Services OT consult     Precautions / Restrictions Precautions Precautions: Fall Precaution Comments: TDWB RLE Restrictions Weight Bearing Restrictions: Yes RLE Weight Bearing: Touchdown weight bearing Other Position/Activity Restrictions: Cam Walker for OOB    Mobility  Bed Mobility Overal bed mobility: Needs Assistance Bed Mobility: Supine to Sit     Supine to sit: Supervision     General bed mobility comments: Supervision for safety, able to supine to sit without physical assistance.   Transfers Overall transfer level: Needs assistance Equipment used: Rolling walker (2 wheeled) Transfers: Sit to/from Stand Sit to Stand: Min guard;Supervision         General transfer comment: min guard to supervision for sit to stand, cues for sequencing and hand placement  Ambulation/Gait Ambulation/Gait assistance: Min guard Ambulation Distance (Feet): 60 Feet Assistive device: Rolling walker (2 wheeled) Gait Pattern/deviations: Step-to pattern;Antalgic Gait velocity: decreased   General Gait Details: Min guard for ambulation, cues for techiqnue, Patient utilizing hop-to gait adhereing to WB status after cues. able to  ambulate into hallway short distances before fatiguing in LLE.    Stairs            Wheelchair Mobility    Modified Rankin (Stroke Patients Only)       Balance Overall balance assessment: Needs assistance Sitting-balance support: Feet unsupported;No upper extremity supported Sitting balance-Leahy Scale: Good     Standing balance support: During functional activity;Bilateral upper extremity supported Standing balance-Leahy Scale: Fair                              Cognition Arousal/Alertness: Awake/alert Behavior During Therapy: WFL for tasks assessed/performed Overall Cognitive Status: Within Functional Limits for tasks assessed                                        Exercises      General Comments        Pertinent Vitals/Pain Pain Assessment: No/denies pain    Home Living                      Prior Function            PT Goals (current goals can now be found in the care plan section) Acute Rehab PT Goals Patient Stated Goal: return home PT Goal Formulation: With patient/family Time For Goal Achievement: 12/31/17 Potential to Achieve Goals: Fair    Frequency    Min 6X/week      PT Plan Current plan remains appropriate    Co-evaluation              AM-PAC PT "6 Clicks" Daily Activity  Outcome  Measure  Difficulty turning over in bed (including adjusting bedclothes, sheets and blankets)?: A Little Difficulty moving from lying on back to sitting on the side of the bed? : A Little Difficulty sitting down on and standing up from a chair with arms (e.g., wheelchair, bedside commode, etc,.)?: A Little Help needed moving to and from a bed to chair (including a wheelchair)?: A Little Help needed walking in hospital room?: A Little Help needed climbing 3-5 steps with a railing? : A Lot 6 Click Score: 17    End of Session Equipment Utilized During Treatment: Gait belt Activity Tolerance: Patient tolerated  treatment well Patient left: in chair;with call bell/phone within reach;with family/visitor present Nurse Communication: Mobility status PT Visit Diagnosis: Unsteadiness on feet (R26.81);Other abnormalities of gait and mobility (R26.89);Pain;Difficulty in walking, not elsewhere classified (R26.2) Pain - Right/Left: Right Pain - part of body: Ankle and joints of foot     Time: [id number] PT Time Calculation (min) (ACUTE ONLY): 35 min  Charges:  $Gait Training: 8-22 mins $Therapeutic Activity: 8-22 mins                    G Codes:       Etta GrandchildSean Mallory Enriques, PT, DPT Acute Rehab Services Pager: [phone]     Etta GrandchildSean  Luvinia Lucy 12/25/2017, 9:28 AM

## 2017-12-25 NOTE — Progress Notes (Signed)
Physical Therapy Treatment Patient Details Name: [name] ShireRamona [name] MRN: [id number] DOB: 09/21/1951 Today's Date: 12/25/2017    History of Present Illness 67 y.o. female s/p R Tibiocalcaneal Fusion 12/02. PMH includes: Osteoporosis, HTN, Arthritis    PT Comments    PM session focused extensively on stair training. Although able to perform, patient is requiring physical assistance to safely ambulate stairs at this time. Diiscussed scooting method to be safer currently and patient agrees. Scheduled translator tomorrow AM to ensure cary over for safety and will reinforce stair training.     Follow Up Recommendations  Home health PT;Supervision/Assistance - 24 hour     Equipment Recommendations  Rolling walker with 5" wheels;3in1 (PT)    Recommendations for Other Services OT consult     Precautions / Restrictions Precautions Precautions: Fall Precaution Comments: TDWB RLE Restrictions Weight Bearing Restrictions: Yes RLE Weight Bearing: Touchdown weight bearing Other Position/Activity Restrictions: Cam Walker for OOB    Mobility  Bed Mobility Overal bed mobility: Needs Assistance Bed Mobility: Supine to Sit     Supine to sit: Supervision     General bed mobility comments: Supervision for safety, able to supine to sit without physical assistance.   Transfers Overall transfer level: Needs assistance Equipment used: Rolling walker (2 wheeled) Transfers: Sit to/from Stand Sit to Stand: Min guard;Supervision         General transfer comment: min guard to supervision for sit to stand, cues for sequencing and hand placement  Ambulation/Gait Ambulation/Gait assistance: Min guard   Assistive device: Rolling walker (2 wheeled) Gait Pattern/deviations: Step-to pattern;Antalgic Gait velocity: decreased   General Gait Details: Min guard for ambulation, cues for techiqnue, Patient utilizing hop-to gait adhereing to WB status after cues. able to ambulate into hallway short  distances before fatiguing in LLE.    Stairs Stairs: Yes   Stair Management: Two rails;With crutches;Backwards Number of Stairs: 12 General stair comments: Trialed stairs several times with family present translating, Mod A at most for balance, min A with heavy cueing for sequencing. Pt unable to go up forwards, utilized RW backwards but inconsitant with RW techiqnue and safety. LOB 3x with Mod A to correct. Translation was an issue using family, scheudled Nurse, learning disabilitytranslator for tomorrow. Discussed scooting on bottom as the safer method at this time.    Wheelchair Mobility    Modified Rankin (Stroke Patients Only)       Balance Overall balance assessment: Needs assistance Sitting-balance support: Feet unsupported;No upper extremity supported Sitting balance-Leahy Scale: Good     Standing balance support: During functional activity;Bilateral upper extremity supported Standing balance-Leahy Scale: Fair                              Cognition Arousal/Alertness: Awake/alert Behavior During Therapy: WFL for tasks assessed/performed Overall Cognitive Status: Within Functional Limits for tasks assessed                                        Exercises      General Comments        Pertinent Vitals/Pain Pain Assessment: No/denies pain    Home Living                      Prior Function            PT Goals (current goals can now be found in  the care plan section) Acute Rehab PT Goals Patient Stated Goal: return home PT Goal Formulation: With patient/family Time For Goal Achievement: 12/31/17 Potential to Achieve Goals: Fair    Frequency    Min 6X/week      PT Plan Current plan remains appropriate    Co-evaluation              AM-PAC PT "6 Clicks" Daily Activity  Outcome Measure  Difficulty turning over in bed (including adjusting bedclothes, sheets and blankets)?: A Little Difficulty moving from lying on back to sitting on  the side of the bed? : A Little Difficulty sitting down on and standing up from a chair with arms (e.g., wheelchair, bedside commode, etc,.)?: A Little Help needed moving to and from a bed to chair (including a wheelchair)?: A Little Help needed walking in hospital room?: A Little Help needed climbing 3-5 steps with a railing? : A Lot 6 Click Score: 17    End of Session Equipment Utilized During Treatment: Gait belt Activity Tolerance: Patient tolerated treatment well Patient left: in chair;with call bell/phone within reach;with family/visitor present Nurse Communication: Mobility status PT Visit Diagnosis: Unsteadiness on feet (R26.81);Other abnormalities of gait and mobility (R26.89);Pain;Difficulty in walking, not elsewhere classified (R26.2) Pain - Right/Left: Right Pain - part of body: Ankle and joints of foot     Time: 1709-1740 PT Time Calculation (min) (ACUTE ONLY): 31 min  Charges:  $Gait Training: 23-37 mins                    G Codes:       Etta Grandchild, PT, DPT Acute Rehab Services Pager: [phone]      Etta Grandchild 12/25/2017, 5:51 PM

## 2017-12-25 NOTE — Care Management Note (Signed)
Case Management Note  Patient Details  Name: Verdie ShireRamona Vazquez-Garcia MRN: [id number] Date of Birth: 09/09/1951  Subjective/Objective:   67 y.o. female s/p R Tibiocalcaneal Fusion                Action/Plan: Case manager spoke with patient through translator concerning discharge and DME. Referral was called to [facility], patient is uninsured and they are determining if she will qualify for charity Home Health. Patient will have family support at discharge.   Expected Discharge Date:   12/26/17               Expected Discharge Plan:     In-House Referral:  NA  Discharge planning Services  CM Consult  Post Acute Care Choice:  Durable Medical Equipment, Home Health Choice offered to:  Patient  DME Arranged:  3-N-1, Walker rolling DME Agency:  [facility]  [facility]  Status of Service:  In process, will continue to follow  If discussed at Long Length of Stay Meetings, dates discussed:    Additional Comments:  Durenda GuthrieBrady, Humbert Morozov Naomi, RN 12/25/2017, 11:20 AM

## 2017-12-25 NOTE — Progress Notes (Signed)
Patient ID: [name] [name], female   DOB: 05/01/1951, 67 y.o.   MRN: [id number] Postoperative day 1 right tibial calcaneal fusion.  Patient is comfortable her foot is plantigrade the dressing is clean and dry.  Plan for physical therapy with the fracture boot on the right minimal weightbearing to the right lower extremity anticipate discharge once safe with therapy.

## 2017-12-26 MED ORDER — OXYCODONE-ACETAMINOPHEN 5-325 MG PO TABS: 1.0000 | ORAL_TABLET | ORAL | 0 refills | Status: DC | PRN

## 2017-12-26 NOTE — Discharge Summary (Signed)
Discharge Diagnoses:  Active Problems:   Post-traumatic osteoarthritis, right ankle and foot   S/P ankle fusion   Surgeries: Procedure(s): RIGHT TIBIOCALCANEAL FUSION on 12/24/2017    Consultants:   Discharged Condition: Improved  Hospital Course: [name] [name] is an 67 y.o. female who was admitted 12/24/2017 with a chief complaint of traumatic arthritis right foot and ankle., with a final diagnosis of Osteoarthritis Right Ankle and Subtalar Joint.  Patient was brought to the operating room on 12/24/2017 and underwent Procedure(s): RIGHT TIBIOCALCANEAL FUSION.    Patient was given perioperative antibiotics:  Anti-infectives (From admission, onward)   Start     Dose/Rate Route Frequency Ordered Stop   12/24/17 1700  ceFAZolin (ANCEF) IVPB 2g/100 mL premix     2 g 200 mL/hr over 30 Minutes Intravenous Every 6 hours 12/24/17 1336 12/25/17 1246   12/24/17 0840  ceFAZolin (ANCEF) IVPB 2g/100 mL premix     2 g 200 mL/hr over 30 Minutes Intravenous On call to O.R. 12/24/17 0840 12/24/17 1125    .  Patient was given sequential compression devices, early ambulation, and aspirin for DVT prophylaxis.  Recent vital signs:  Patient Vitals for the past 24 hrs:  BP Temp Temp src Pulse Resp SpO2  12/26/17 0358 (!) 97/51 98.4 F (36.9 C) Oral 60 18 98 %  12/25/17 1930 110/65 97.7 F (36.5 C) Oral 60 18 100 %  12/25/17 1100 - - - - - 97 %  .  Recent laboratory studies: No results found.  Discharge Medications:   Allergies as of 12/26/2017   No Known Allergies     Medication List    STOP taking these medications   HYDROcodone-acetaminophen 5-325 MG tablet Commonly known as:  NORCO     TAKE these medications   Calcium Carb-Cholecalciferol 600-800 MG-UNIT Tabs Commonly known as:  CALTRATE 600+D Take 2 tablets by mouth daily.   lisinopril-hydrochlorothiazide 20-12.5 MG tablet Commonly known as:  ZESTORETIC Take 2 tablets by mouth daily.   lovastatin 20 MG tablet Commonly  known as:  MEVACOR Take 2 tablets (40 mg total) by mouth at bedtime.   meloxicam 15 MG tablet Commonly known as:  MOBIC Take 1 tablet (15 mg total) daily by mouth.   oxyCODONE-acetaminophen 5-325 MG tablet Commonly known as:  PERCOCET/ROXICET Take 1 tablet by mouth every 4 (four) hours as needed for severe pain.            Discharge Care Instructions  (From admission, onward)        Start     Ordered   12/26/17 0000  Touch down weight bearing    Question Answer Comment  Laterality right   Extremity Lower      12/26/17 0715      Diagnostic Studies: No results found.  Patient benefited maximally from their hospital stay and there were no complications.     Disposition: 01-Home or Self Care Discharge Instructions    Call MD / Call 911   Complete by:  As directed    If you experience chest pain or shortness of breath, CALL 911 and be transported to the hospital emergency room.  If you develope a fever above 101 F, pus (white drainage) or increased drainage or redness at the wound, or calf pain, call your surgeon's office.   Constipation Prevention   Complete by:  As directed    Drink plenty of fluids.  Prune juice may be helpful.  You may use a stool softener, such as Colace (over the  counter) 100 mg twice a day.  Use MiraLax (over the counter) for constipation as needed.   Diet - low sodium heart healthy   Complete by:  As directed    Increase activity slowly as tolerated   Complete by:  As directed    Touch down weight bearing   Complete by:  As directed    Laterality:  right   Extremity:  Lower     Follow-up Information    Nadara Mustard, MD Follow up in 1 week(s).   Specialty:  Orthopedic Surgery Contact information: [address] [place] [address] [phone]            Signed: Nadara Mustard 12/26/2017, 7:16 AM

## 2017-12-26 NOTE — Evaluation (Signed)
Occupational Therapy Evaluation Patient Details Name: [name] [name] MRN: [id number] DOB: 07/19/1951 Today's Date: 12/26/2017    History of Present Illness 67 y.o. female s/p R Tibiocalcaneal Fusion 12/02. PMH includes: Osteoporosis, HTN, Arthritis   Clinical Impression   Patient is s/p R tibiocalcaneal fusion surgery resulting in functional limitations due to the deficits listed below (see OT problem list). PTA was using w/c in the home for mobility and (A) of 10 children PRN. Pt reports "they do not let me do it alone. I have plenty of help. Pt currently will need (A) for all bath transfers into the tub onto the shower seat and advised to not do it alone.  Patient will benefit from skilled OT acutely to increase independence and safety with ADLS to allow discharge home with 3n1.     Follow Up Recommendations  No OT follow up    Equipment Recommendations  3 in 1 bedside commode    Recommendations for Other Services       Precautions / Restrictions Precautions Precautions: Fall Precaution Comments: TDWB RLE Restrictions Weight Bearing Restrictions: Yes RLE Weight Bearing: Touchdown weight bearing Other Position/Activity Restrictions: Cam Walker for OOB      Mobility Bed Mobility Overal bed mobility: Needs Assistance Bed Mobility: Supine to Sit     Supine to sit: Supervision     General bed mobility comments: in gym with PT Sean on arrival  Transfers Overall transfer level: Needs assistance Equipment used: Rolling walker (2 wheeled) Transfers: Sit to/from Stand Sit to Stand: Min guard;Supervision         General transfer comment: cues for hand placement and safety    Balance Overall balance assessment: Needs assistance Sitting-balance support: Feet unsupported;No upper extremity supported Sitting balance-Leahy Scale: Good     Standing balance support: During functional activity;Bilateral upper extremity supported Standing balance-Leahy Scale: Fair                              ADL either performed or assessed with clinical judgement   ADL Overall ADL's : Needs assistance/impaired Eating/Feeding: Independent   Grooming: Independent   Upper Body Bathing: Minimal assistance   Lower Body Bathing: Minimal assistance         Lower Body Dressing Details (indicate cue type and reason): pt is able to reach bil LE to don doff shoes / socks at this time. Educated on trash bag over R LE to prevent getting it wet  Toilet Transfer: Radiographer, therapeuticupervision/safety Toilet Transfer Details (indicate cue type and reason): cues to control descend     Tub/ Shower Transfer: Tub transfer;Shower Dealerseat;Rolling walker Tub/Shower Transfer Details (indicate cue type and reason): pt educated on hand placement and positioning. pt plans to back up to seat / sit and sit R LE outside of the tub on the R edge with water behind the patient. Daughters will help turn water on and off Functional mobility during ADLs: Min guard;Rolling walker General ADL Comments: pt educated on dressing/ bathing/ don doff cam boot, shower transfer/ toilet transfer and positioning seats within the home to make safe places to rest with fatigue.      Vision Baseline Vision/History: No visual deficits       Perception     Praxis      Pertinent Vitals/Pain Pain Assessment: No/denies pain     Hand Dominance Right   Extremity/Trunk Assessment Upper Extremity Assessment Upper Extremity Assessment: Overall WFL for tasks assessed   Lower Extremity  Assessment Lower Extremity Assessment: Defer to PT evaluation;LLE deficits/detail LLE Deficits / Details: with post op boot on and plans to wear normal tennis shoe upon d/c home   Cervical / Trunk Assessment Cervical / Trunk Assessment: Kyphotic   Communication Communication Communication: Prefers language other than English(spanish)   Cognition Arousal/Alertness: Awake/alert Behavior During Therapy: WFL for tasks  assessed/performed Overall Cognitive Status: Within Functional Limits for tasks assessed                                     General Comments  interpreter Penne Lash present in gym to help translate education. Ot using teach back to ensure patient full understanding of education    Exercises     Shoulder Instructions      Home Living Family/patient expects to be discharged to:: Private residence Living Arrangements: Spouse/significant other Available Help at Discharge: Family;Available PRN/intermittently Type of Home: House Home Access: Stairs to enter [employer] of Steps: 3 Entrance Stairs-Rails: Can reach both Home Layout: One level     Bathroom Shower/Tub: Tub only   Firefighter: Standard Bathroom Accessibility: Yes How Accessible: Accessible via wheelchair Home Equipment: Cane - quad;Wheelchair - manual;Shower seat   Additional Comments: pt has 6 daughters and 4 sons that all live near by that will be helping upon d/c. pt has w/c that she uses to self propel around the home. pt will have daughters help for bathing at all times per patient. pt reports they park within 2 ft of the door to the house due to pain previous to this admission      Prior Functioning/Environment Level of Independence: Independent with assistive device(s)        Comments: pt uses a wheelchair in the home and self propels with hands and feet at baseline. pt uses shower seat at baseline        OT Problem List: Decreased activity tolerance;Decreased knowledge of precautions;Decreased knowledge of use of DME or AE;Decreased safety awareness;Decreased strength      OT Treatment/Interventions: Self-care/ADL training;Therapeutic activities;Therapeutic exercise;Balance training;Patient/family education    OT Goals(Current goals can be found in the care plan section) Acute Rehab OT Goals Patient Stated Goal: return home OT Goal Formulation: With patient Time For Goal  Achievement: 01/05/18 Potential to Achieve Goals: Good  OT Frequency: Min 2X/week   Barriers to D/C:            Co-evaluation              AM-PAC PT "6 Clicks" Daily Activity     Outcome Measure Help from another person eating meals?: None Help from another person taking care of personal grooming?: A Little Help from another person toileting, which includes using toliet, bedpan, or urinal?: A Little Help from another person bathing (including washing, rinsing, drying)?: A Little Help from another person to put on and taking off regular upper body clothing?: A Little Help from another person to put on and taking off regular lower body clothing?: A Little 6 Click Score: 19   End of Session Equipment Utilized During Treatment: Gait belt;Rolling walker Nurse Communication: Mobility status;Precautions  Activity Tolerance: Patient tolerated treatment well Patient left: in chair;with call bell/phone within reach  OT Visit Diagnosis: Unsteadiness on feet (R26.81)                Time: 1610-9604 OT Time Calculation (min): 37 min Charges:  OT General Charges $OT Visit:  1 Visit OT Evaluation $OT Eval Moderate Complexity: 1 Mod OT Treatments $Self Care/Home Management : 8-22 mins G-Codes:      Mateo Flow   OTR/L Pager: [phone] Office: [phone] .   Boone Master B 12/26/2017, 11:18 AM

## 2017-12-26 NOTE — Progress Notes (Signed)
Physical Therapy Treatment Patient Details Name: [name] ShireRamona Vazquez-Garcia MRN: [id number] DOB: 07/06/1951 Today's Date: 12/26/2017    History of Present Illness 67 y.o. female s/p R Tibiocalcaneal Fusion 12/02. PMH includes: Osteoporosis, HTN, Arthritis    PT Comments    Session focused extensively on stair training (See General Stair Comments section below).  Patient reports she understands technique and has no concerns about home entry  in preparation for d/c home today when medically cleared. Patient is adhering to WB status during ambulation and was advised to have family supervision for guarding around the home with all OOB mobilization, she agrees this is safest at this time.     Follow Up Recommendations  Home health PT;Supervision/Assistance - 24 hour     Equipment Recommendations  Rolling walker with 5" wheels;3in1 (PT)    Recommendations for Other Services OT consult     Precautions / Restrictions Precautions Precautions: Fall Precaution Comments: TDWB RLE Restrictions Weight Bearing Restrictions: Yes RLE Weight Bearing: Touchdown weight bearing    Mobility  Bed Mobility Overal bed mobility: Needs Assistance Bed Mobility: Supine to Sit     Supine to sit: Supervision        Transfers Overall transfer level: Needs assistance Equipment used: Rolling walker (2 wheeled) Transfers: Sit to/from Stand Sit to Stand: Min guard;Supervision         General transfer comment: needs reinforcocement of cues for hand placement during stand to sit. patient tends to collopase down into chair with RW closer to chair.   Ambulation/Gait Ambulation/Gait assistance: Min guard Ambulation Distance (Feet): 20 Feet Assistive device: Rolling walker (2 wheeled) Gait Pattern/deviations: Step-to pattern;Antalgic     General Gait Details: min guard to supervision for ambulatoin, patient is adherent to WB status during hop to gait with RW.   Stairs Stairs: Yes   Stair  Management: Two rails;With crutches;Backwards Number of Stairs: 8 General stair comments: Attempted to reinforce stair training from prior session with RW backwards. Patient understands techinque better via translator, however cannot safely consistantly follow WB precautions via this method. Advised patient to instead utilize scooting on bottom in order to enter home today. Patient practied and feels comfortable she can do that with her family supporting her leg and guarding. Per translator she understands PT reccomendations.   Wheelchair Mobility    Modified Rankin (Stroke Patients Only)       Balance Overall balance assessment: Needs assistance Sitting-balance support: Feet unsupported;No upper extremity supported Sitting balance-Leahy Scale: Good     Standing balance support: During functional activity;Bilateral upper extremity supported Standing balance-Leahy Scale: Fair                              Cognition Arousal/Alertness: Awake/alert Behavior During Therapy: WFL for tasks assessed/performed Overall Cognitive Status: Within Functional Limits for tasks assessed                                        Exercises      General Comments General comments (skin integrity, edema, etc.): translator utilized for session       Pertinent Vitals/Pain Pain Assessment: No/denies pain    Home Living                      Prior Function            PT Goals (current  goals can now be found in the care plan section) Acute Rehab PT Goals Patient Stated Goal: return home PT Goal Formulation: With patient/family Time For Goal Achievement: 12/31/17 Potential to Achieve Goals: Fair    Frequency    Min 6X/week      PT Plan Current plan remains appropriate    Co-evaluation              AM-PAC PT "6 Clicks" Daily Activity  Outcome Measure  Difficulty turning over in bed (including adjusting bedclothes, sheets and blankets)?: A  Little Difficulty moving from lying on back to sitting on the side of the bed? : A Little Difficulty sitting down on and standing up from a chair with arms (e.g., wheelchair, bedside commode, etc,.)?: A Little Help needed moving to and from a bed to chair (including a wheelchair)?: A Little Help needed walking in hospital room?: A Little Help needed climbing 3-5 steps with a railing? : A Lot 6 Click Score: 17    End of Session Equipment Utilized During Treatment: Gait belt Activity Tolerance: Patient tolerated treatment well Patient left: Other (comment)(In ortho gym with OT and translator) Nurse Communication: Mobility status PT Visit Diagnosis: Unsteadiness on feet (R26.81);Other abnormalities of gait and mobility (R26.89);Pain;Difficulty in walking, not elsewhere classified (R26.2) Pain - Right/Left: Right Pain - part of body: Ankle and joints of foot     Time: 1015-1036 PT Time Calculation (min) (ACUTE ONLY): 21 min  Charges:  $Gait Training: 8-22 mins                    G Codes:       Etta Grandchild, PT, DPT Acute Rehab Services Pager: [phone]    Etta Grandchild 12/26/2017, 10:43 AM

## 2017-12-30 ENCOUNTER — Other Ambulatory Visit (INDEPENDENT_AMBULATORY_CARE_PROVIDER_SITE_OTHER): Payer: Self-pay | Admitting: Physician Assistant

## 2017-12-30 ENCOUNTER — Telehealth (INDEPENDENT_AMBULATORY_CARE_PROVIDER_SITE_OTHER): Payer: Self-pay | Admitting: Physician Assistant

## 2017-12-30 DIAGNOSIS — M21961 Unspecified acquired deformity of right lower leg: Secondary | ICD-10-CM

## 2017-12-30 DIAGNOSIS — M21962 Unspecified acquired deformity of left lower leg: Secondary | ICD-10-CM

## 2017-12-30 MED ORDER — MELOXICAM 15 MG PO TABS: 15.0000 mg | ORAL_TABLET | Freq: Every day | ORAL | 0 refills | Status: DC

## 2017-12-30 NOTE — Telephone Encounter (Signed)
Pt called to request a refill for meloxicam (MOBIC) 15 MG tablet [facility] Please follow up

## 2017-12-30 NOTE — Telephone Encounter (Signed)
Meloxicam has been sent. Please notify patient.

## 2017-12-31 MED FILL — MELOXICAM 15 MG TABLET: 15 | 30 days supply | Qty: 30 | Fill #0

## 2017-12-31 NOTE — Telephone Encounter (Signed)
Patient aware. Ataya Murdy S Nike [place], CMA  

## 2018-01-06 ENCOUNTER — Ambulatory Visit (INDEPENDENT_AMBULATORY_CARE_PROVIDER_SITE_OTHER): Payer: Self-pay

## 2018-01-06 ENCOUNTER — Ambulatory Visit (INDEPENDENT_AMBULATORY_CARE_PROVIDER_SITE_OTHER): Payer: Self-pay | Admitting: Orthopedic Surgery

## 2018-01-06 ENCOUNTER — Encounter (INDEPENDENT_AMBULATORY_CARE_PROVIDER_SITE_OTHER): Payer: Self-pay | Admitting: Orthopedic Surgery

## 2018-01-06 VITALS — Ht <= 58 in | Wt 187.0 lb

## 2018-01-06 DIAGNOSIS — Z981 Arthrodesis status: Secondary | ICD-10-CM

## 2018-01-06 DIAGNOSIS — M19171 Post-traumatic osteoarthritis, right ankle and foot: Secondary | ICD-10-CM

## 2018-01-06 NOTE — Progress Notes (Signed)
Office Visit Note   Patient: [name] [name]           Date of Birth: 17-Oct-1951           MRN: [id number] Visit Date: 01/06/2018              Requested by: Loletta Specter, PA-C [address] [place], [place] [address] PCP: Loletta Specter, PA-C  Chief Complaint  Patient presents with  . Right Ankle - Routine Post Op    12/24/17 right tibiocalcaneal fusion       HPI: Patient presents in follow-up 2 weeks status post tibial calcaneal fusion she has no complaints.  Assessment & Plan: Visit Diagnoses:  1. H/O ankle fusion   2. Post-traumatic osteoarthritis, right ankle and foot     Plan: Will harvest the sutures today she may begin washing with soap and water use an Ace wrap plus gauze over the incisions continue with the fracture boot.  Patient was supposed to be set up with home health therapy from the hospital this did not happen we will call to set this up she will continue nonweightbearing on the right  3 view radiographs of the right ankle in 3 weeks follow-up.  Follow-Up Instructions: Return in about 3 weeks (around 01/27/2018).   Ortho Exam  Patient is alert, oriented, no adenopathy, well-dressed, normal affect, normal respiratory effort. Examination patient's foot is plantigrade.  The incisions are healing nicely there is no redness no cellulitis no drainage no signs of infection no pain.  Imaging: Xr Ankle Complete Right  Result Date: 01/06/2018 3 view radiographs of the right ankle shows stable alignment of the tibial calcaneal fusion no complicating features.  No images are attached to the encounter.  Labs: Lab Results  Component Value Date   HGBA1C 5.6 11/08/2013   ESRSEDRATE 20 09/17/2007   REPTSTATUS 01/11/2008 FINAL 01/09/2008   CULT  01/09/2008    LACTOBACILLUS SPECIES Note: Standardized susceptibility testing for this organism is not available.   LABORGA Normal Upper Respiratory Flora 07/28/2014   LABORGA No Beta Hemolytic  Streptococci Isolated 07/28/2014    @LABSALLVALUES (HGBA1)@  Body mass index is 39.08 kg/m.  Orders:  Orders Placed This Encounter  Procedures  . XR Ankle Complete Right   No orders of the defined types were placed in this encounter.    Procedures: No procedures performed  Clinical Data: No additional findings.  ROS:  All other systems negative, except as noted in the HPI. Review of Systems  Objective: Vital Signs: Ht 4\' 10"  (1.473 m)   Wt 187 lb (84.8 kg)   BMI 39.08 kg/m   Specialty Comments:  No specialty comments available.  PMFS History: Patient Active Problem List   Diagnosis Date Noted  . 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/2017  . Post-traumatic osteoarthritis, right ankle and foot   . Seasonal allergic rhinitis due to pollen 05/12/2017  . Allergy   . Cataract 01/28/2017  . Presbyopia of both eyes 01/28/2017  . Pterygium eye, 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  . Astigmatism, 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  . 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 Acquired bilateral flat feet 09/07/2015  . Osteoporosis 09/07/2015  . Transaminasemia 01/26/2015  . Generalized anxiety disorder 01/26/2015  . Essential hypertension 07/28/2014  . Gastritis 07/28/2014  . 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  . OBESITY 05/10/2009  . NEPHROLITHIASIS 06/15/2008  . Dyslipidemia 03/16/2008  . 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 BUNDLE BRANCH BLOCK, LEFT 12/11/2007  . PTSD 10/01/2007  . ARM PAIN, LEFT 10/01/2007   Past Medical  History:  Diagnosis Date  . Allergy    spring pollen  . Arthritis   . Astigmatism, 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  . Cataract 01/28/2017  . High cholesterol   . History of kidney stones   . 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 Hypertension   . Osteoarthritis of ankle, right    and Subtalar Joint  . Osteoporosis   . Presbyopia of both eyes 01/28/2017  . Pterygium eye, 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    History reviewed. No pertinent family history.  Past Surgical History:  Procedure Laterality Date  .  ANKLE FUSION Right 12/24/2017   Tibiocalcaneal fusion  . ANKLE FUSION Right 12/24/2017   Procedure: RIGHT TIBIOCALCANEAL FUSION;  Surgeon: Nadara Mustarduda, Lex Linhares V, MD;  [facility];  Service: Orthopedics;  Laterality: Right;  . CHOLECYSTECTOMY     Social History   Occupational History  . Not on file  Tobacco Use  . Smoking status: Never Smoker  . Smokeless tobacco: Never Used  Substance and Sexual Activity  . Alcohol use: No    Alcohol/week: 0.0 oz  . Drug use: No  . Sexual activity: Not on file

## 2018-01-07 ENCOUNTER — Other Ambulatory Visit (INDEPENDENT_AMBULATORY_CARE_PROVIDER_SITE_OTHER): Payer: Self-pay

## 2018-01-08 ENCOUNTER — Telehealth (INDEPENDENT_AMBULATORY_CARE_PROVIDER_SITE_OTHER): Payer: Self-pay | Admitting: Physician Assistant

## 2018-01-08 NOTE — Telephone Encounter (Signed)
Pt called to request a refill for   HYDROcodone-acetaminophen (NORCO) 5-325 MG tablet   Please sent it to [facility] on [address], please follow up

## 2018-01-09 ENCOUNTER — Other Ambulatory Visit (INDEPENDENT_AMBULATORY_CARE_PROVIDER_SITE_OTHER): Payer: Self-pay | Admitting: Physician Assistant

## 2018-01-09 ENCOUNTER — Telehealth (INDEPENDENT_AMBULATORY_CARE_PROVIDER_SITE_OTHER): Payer: Self-pay

## 2018-01-09 DIAGNOSIS — M25471 Effusion, right ankle: Secondary | ICD-10-CM

## 2018-01-09 DIAGNOSIS — M25571 Pain in right ankle and joints of right foot: Principal | ICD-10-CM

## 2018-01-09 MED ORDER — HYDROCODONE-ACETAMINOPHEN 5-325 MG PO TABS: 1.0000 | ORAL_TABLET | Freq: Three times a day (TID) | ORAL | 0 refills | Status: DC | PRN

## 2018-01-09 NOTE — Telephone Encounter (Signed)
I called and told patient to pick up rx. Pt was thankful.

## 2018-01-09 NOTE — Telephone Encounter (Signed)
Pt called to check on the statu of the refill request, please she out of med, need the med

## 2018-01-09 NOTE — Telephone Encounter (Signed)
[facility] is not able to see pt. [facility]

## 2018-01-27 ENCOUNTER — Ambulatory Visit: Payer: Self-pay | Attending: Physician Assistant

## 2018-01-27 ENCOUNTER — Encounter (INDEPENDENT_AMBULATORY_CARE_PROVIDER_SITE_OTHER): Payer: Self-pay | Admitting: Orthopedic Surgery

## 2018-01-27 ENCOUNTER — Ambulatory Visit (INDEPENDENT_AMBULATORY_CARE_PROVIDER_SITE_OTHER): Payer: Self-pay

## 2018-01-27 ENCOUNTER — Ambulatory Visit (INDEPENDENT_AMBULATORY_CARE_PROVIDER_SITE_OTHER): Payer: Self-pay | Admitting: Orthopedic Surgery

## 2018-01-27 VITALS — Ht <= 58 in | Wt 187.0 lb

## 2018-01-27 DIAGNOSIS — Z981 Arthrodesis status: Secondary | ICD-10-CM

## 2018-01-27 DIAGNOSIS — M25571 Pain in right ankle and joints of right foot: Secondary | ICD-10-CM

## 2018-01-27 NOTE — Progress Notes (Signed)
Office Visit Note   Patient: [name] [name]           Date of Birth: 1951-10-22           MRN: [id number] Visit Date: 01/27/2018              Requested by: Loletta Specter, PA-C [address] [place], [place] [address] PCP: Loletta Specter, PA-C  Chief Complaint  Patient presents with  . Right Ankle - Routine Post Op    12/24/17 right tibial calcaneal fusion       HPI: Patient is a 67 year old woman who presents 1 month status post right tibial calcaneal fusion she has been touchdown weightbearing in a fracture boot she is pleased with her progress has no concerns.  Assessment & Plan: Visit Diagnoses:  1. Pain in right ankle and joints of right foot   2. H/O ankle fusion     Plan: Patient will advance weightbearing as tolerated in the fracture boot she will get a pair of medical compression stockings for the venous stasis swelling in her leg.  Patient is concerned with the swelling.  Follow-Up Instructions: Return in about 4 weeks (around 02/24/2018).   Ortho Exam  Patient is alert, oriented, no adenopathy, well-dressed, normal affect, normal respiratory effort. Examination the incisions are well-healed the foot is plantigrade she has venous stasis swelling there is no calf tenderness no signs of DVT.  There is no redness no cellulitis no signs of infection.  Imaging: Xr Ankle Complete Right  Result Date: 01/27/2018 3 view radiographs of the right ankle shows a stable tibial calcaneal fusion the hardware is intact no complicating features.  No images are attached to the encounter.  Labs: Lab Results  Component Value Date   HGBA1C 5.6 11/08/2013   ESRSEDRATE 20 09/17/2007   REPTSTATUS 01/11/2008 FINAL 01/09/2008   CULT  01/09/2008    LACTOBACILLUS SPECIES Note: Standardized susceptibility testing for this organism is not available.   LABORGA Normal Upper Respiratory Flora 07/28/2014   LABORGA No Beta Hemolytic Streptococci Isolated 07/28/2014     @LABSALLVALUES (HGBA1)@  Body mass index is 39.08 kg/m.  Orders:  Orders Placed This Encounter  Procedures  . XR Ankle Complete Right   No orders of the defined types were placed in this encounter.    Procedures: No procedures performed  Clinical Data: No additional findings.  ROS:  All other systems negative, except as noted in the HPI. Review of Systems  Objective: Vital Signs: Ht 4\' 10"  (1.473 m)   Wt 187 lb (84.8 kg)   BMI 39.08 kg/m   Specialty Comments:  No specialty comments available.  PMFS History: Patient Active Problem List   Diagnosis Date Noted  . 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/2017  . Post-traumatic osteoarthritis, right ankle and foot   . Seasonal allergic rhinitis due to pollen 05/12/2017  . Allergy   . Cataract 01/28/2017  . Presbyopia of both eyes 01/28/2017  . Pterygium eye, 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  . Astigmatism, 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  . 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 Acquired bilateral flat feet 09/07/2015  . Osteoporosis 09/07/2015  . Transaminasemia 01/26/2015  . Generalized anxiety disorder 01/26/2015  . Essential hypertension 07/28/2014  . Gastritis 07/28/2014  . 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  . OBESITY 05/10/2009  . NEPHROLITHIASIS 06/15/2008  . Dyslipidemia 03/16/2008  . 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 BUNDLE BRANCH BLOCK, LEFT 12/11/2007  . PTSD 10/01/2007  . ARM PAIN, LEFT 10/01/2007   Past Medical History:  Diagnosis Date  .  Allergy    spring pollen  . Arthritis   . Astigmatism, 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  . Cataract 01/28/2017  . High cholesterol   . History of kidney stones   . 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 Hypertension   . Osteoarthritis of ankle, right    and Subtalar Joint  . Osteoporosis   . Presbyopia of both eyes 01/28/2017  . Pterygium eye, 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    History reviewed. No pertinent family history.  Past Surgical History:  Procedure Laterality Date  . ANKLE FUSION Right 12/24/2017    Tibiocalcaneal fusion  . ANKLE FUSION Right 12/24/2017   Procedure: RIGHT TIBIOCALCANEAL FUSION;  Surgeon: Nadara Mustarduda, Branna Cortina V, MD;  [facility];  Service: Orthopedics;  Laterality: Right;  . CHOLECYSTECTOMY     Social History   Occupational History  . Not on file  Tobacco Use  . Smoking status: Never Smoker  . Smokeless tobacco: Never Used  Substance and Sexual Activity  . Alcohol use: No    Alcohol/week: 0.0 oz  . Drug use: No  . Sexual activity: Not on file

## 2018-01-28 ENCOUNTER — Encounter (INDEPENDENT_AMBULATORY_CARE_PROVIDER_SITE_OTHER): Payer: Self-pay | Admitting: Physician Assistant

## 2018-01-28 ENCOUNTER — Ambulatory Visit (INDEPENDENT_AMBULATORY_CARE_PROVIDER_SITE_OTHER): Payer: Self-pay | Admitting: Physician Assistant

## 2018-01-28 VITALS — BP 135/77 | HR 66 | Temp 98.2°F | Resp 18 | Ht 59.0 in | Wt 183.0 lb

## 2018-01-28 DIAGNOSIS — Z131 Encounter for screening for diabetes mellitus: Secondary | ICD-10-CM

## 2018-01-28 DIAGNOSIS — B349 Viral infection, unspecified: Secondary | ICD-10-CM

## 2018-01-28 DIAGNOSIS — R112 Nausea with vomiting, unspecified: Secondary | ICD-10-CM

## 2018-01-28 DIAGNOSIS — R1013 Epigastric pain: Secondary | ICD-10-CM

## 2018-01-28 LAB — POCT GLYCOSYLATED HEMOGLOBIN (HGB A1C): HEMOGLOBIN A1C: 5.4

## 2018-01-28 MED ORDER — OMEPRAZOLE 40 MG PO CPDR: 40.0000 mg | DELAYED_RELEASE_CAPSULE | Freq: Every day | ORAL | 3 refills | Status: DC

## 2018-01-28 MED ORDER — PROMETHAZINE HCL 25 MG PO TABS: 25.0000 mg | ORAL_TABLET | Freq: Three times a day (TID) | ORAL | 0 refills | Status: DC | PRN

## 2018-01-28 MED ORDER — ACETAMINOPHEN 500 MG PO TABS
1000.0000 mg | ORAL_TABLET | Freq: Three times a day (TID) | ORAL | 0 refills | Status: AC | PRN
Start: 2018-01-28 — End: 2018-02-04

## 2018-01-28 MED FILL — OMEPRAZOLE DR 40 MG CAPSULE: 40 | 30 days supply | Qty: 30 | Fill #0

## 2018-01-28 MED FILL — PROMETHAZINE 25 MG TABLET: 25 | 5 days supply | Qty: 15 | Fill #0

## 2018-01-28 NOTE — Patient Instructions (Signed)
Nuseas en los adultos (Nausea, Adult) Tener nuseas significa sentir Programme researcher, broadcasting/film/videomalestar estomacal o ganas de vomitar. Las nuseas en s a menudo no revisten gravedad, pero pueden ser un signo temprano de problemas mdicos ms graves. Si las nuseas empeoran, pueden ocasionar vmitos. Si vomita o si no puede beber suficiente lquido, corre el riesgo de deshidratarse. La deshidratacin puede hacer que se sienta cansado y sediento, que tenga la boca seca y que orine con menos frecuencia. Los ONEOKadultos mayores y las personas que tienen otras enfermedades o un sistema de defensa (inmunitario) dbil tienen mayor riesgo de sufrir deshidratacin. El Dundeeobjetivo principal del tratamiento de esta afeccin es lo siguiente:  Limitar la frecuencia con la que siente nuseas.  Prevenir los vmitos y la deshidratacin. CUIDADOS EN EL HOGAR Siga las indicaciones del mdico sobre cmo debe cuidarse en su casa. Comida y bebida Siga estas recomendaciones como se lo haya indicado el mdico:  Tome una solucin de rehidratacin oral (SRO). Es Neomia Dearuna bebida que se vende en farmacias y tiendas.  Beba lquidos claros en pequeas cantidades segn le sea posible. Por ejemplo: ? [place]. ? Cubitos de hielo. ? Jugo de frutas con agua agregada (jugo de frutas diluido). ? Bebidas deportivas de bajas caloras.  En la medida en que pueda, consuma alimentos blandos y fciles de digerir en pequeas cantidades. Por ejemplo: ? Bananas. ? Pur de Praxairmanzana. ? Arroz. ? BJ'sCarnes magras. ? Tostadas. ? Galletas.  Evite beber lquidos que contengan mucha azcar o cafena.  Evite el alcohol.  Evite los alimentos picantes o grasos. Instrucciones generales  Beba suficiente lquido para mantener el pis (orina) claro o de color amarillo plido.  Lvese las manos con frecuencia. Use un desinfectante para manos si no dispone de Franceagua y Belarusjabn.  Asegrese de que todas las personas que viven en su casa se laven bien las manos y con  frecuencia.  Descanse en su casa hasta sentirse mejor.  Tome los medicamentos de venta libre y los recetados solamente como se lo haya indicado el mdico.  Cuando sienta nuseas, respire lenta y profundamente.  Controle su afeccin para ver si hay cambios.  Concurra a todas las visitas de control como se lo haya indicado el mdico. Esto es importante. SOLICITE AYUDA SI:  Tiene dolores de Turkmenistancabeza.  Aparecen nuevos sntomas.  El Museum/gallery curatormalestar estomacal empeora.  Tiene fiebre.  Se siente mareado o siente que va a desvanecerse.  Vomita  No puede retener los lquidos. SOLICITE AYUDA DE INMEDIATO SI:  Siente dolor en el pecho, el cuello, los brazos o la Carlislemandbula.  Se siente muy dbil o se desvanece (se desmaya).  Vomita sangre de color rojo brillante o el vmito se asemeja al poso del caf.  Tiene heces con sangre, de color negro, o con aspecto alquitranado.  Siente un dolor de cabeza intenso, rigidez en el cuello, o ambas cosas.  Tiene dolor muy intenso en el vientre, clicos o meteorismo.  Tiene una erupcin cutnea.  Le cuesta respirar o respira muy rpidamente.  Su corazn late muy rpidamente.  Siente la piel fra y hmeda.  Se siente confundido.  Siente dolor al ConocoPhillipsorinar.  Tiene signos de deshidratacin, por ejemplo: ? Larose KellsOrina oscura, muy escasa o Arubaausencia de orina. ? Labios agrietados. ? M.D.C. HoldingsBoca seca. ? Ojos hundidos. ? Somnolencia. ? Debilidad. Estos sntomas pueden Customer service managerindicar una emergencia. No espere hasta que los sntomas desaparezcan. Solicite atencin mdica de inmediato. Comunquese con el servicio de emergencias de su localidad (911 en los [place]). No conduzca  por sus propios medios [employer]. Esta informacin no tiene Theme park manager el consejo del mdico. Asegrese de hacerle al mdico cualquier pregunta que tenga. Document Released: 11/28/2011 Document Revised: 04/01/2016 Document Reviewed: 08/15/2015 Elsevier Interactive Patient  Education  [employer].

## 2018-01-28 NOTE — Progress Notes (Signed)
Subjective:  Patient ID: [name] [name], female    DOB: November 10, 1951  Age: 67 y.o. MRN: [id number]  CC: diarrhea   HPI  [name] Vazquez-Garciais a 67 y.o.femalewith a PMH of HTN, HLD, osteoporosis, cataract, astigmatism, hypermetropia, presbyopia, pterygium, and peroneal tendinitis bilaterally presents with complaint of diarrhea, nausea, vomiting, fever, chills, nasal congestion, epigastric pain, headache, dizziness, and cough.  Diarrhea and vomiting stopped two days ago. Epigastric pain is resolving. Cough is occasional and still present. Symptoms attributed to exposure to her sick grandchild. Grandchild reportedly had a cold/flu. Does not endorse any other symptoms or complaints.    Outpatient Medications Prior to Visit  Medication Sig Dispense Refill  . lisinopril-hydrochlorothiazide (ZESTORETIC) 20-12.5 MG tablet Take 2 tablets by mouth daily. 180 tablet 3  . lovastatin (MEVACOR) 20 MG tablet Take 2 tablets (40 mg total) by mouth at bedtime. 180 tablet 3  . meloxicam (MOBIC) 15 MG tablet Take 1 tablet (15 mg total) by mouth daily. 30 tablet 0  . Calcium Carb-Cholecalciferol (CALTRATE 600+D) 600-800 MG-UNIT TABS Take 2 tablets by mouth daily. 60 tablet 11  . HYDROcodone-acetaminophen (NORCO) 5-325 MG tablet Take 1 tablet by mouth every 8 (eight) hours as needed for moderate pain. (Patient not taking: Reported on 01/28/2018) 30 tablet 0   No facility-administered medications prior to visit.      ROS Review of Systems  Constitutional: Positive for chills, fever and malaise/fatigue.  HENT: Positive for congestion. Negative for sore throat.   Eyes: Negative for blurred vision and pain.  Respiratory: Positive for cough. Negative for shortness of breath.   Cardiovascular: Negative for chest pain and palpitations.  Gastrointestinal: Positive for abdominal pain, diarrhea, nausea and vomiting.  Genitourinary: Negative for dysuria and hematuria.  Musculoskeletal: Negative for joint  pain and myalgias.  Skin: Negative for rash.  Neurological: Positive for dizziness and headaches. Negative for tingling.  Psychiatric/Behavioral: Negative for depression. The patient is not nervous/anxious.     Objective:  BP 135/77 (BP Location: Right Arm, Patient Position: Sitting, Cuff Size: Large)   Pulse 66   Temp 98.2 F (36.8 C) (Oral)   Resp 18   Ht 4\' 11"  (1.499 m)   Wt 183 lb (83 kg)   SpO2 96%   BMI 36.96 kg/m   BP/Weight 01/28/2018 01/27/2018 01/06/2018  Systolic BP 135 - -  Diastolic BP 77 - -  Wt. (Lbs) 183 187 187  BMI 36.96 39.08 39.08      Physical Exam  Constitutional: She is oriented to person, place, and time.  Well developed, overweight, NAD, polite, no cough during encounter, not toxic appearing, ambulating with walker 2/2 surgical repair of right ankle.  HENT:  Head: Normocephalic and atraumatic.  Oropharynx mildly erythematous with mild postnasal drip. No oropharyngeal exudates.   Eyes: Conjunctivae are normal. No scleral icterus.  Neck: Normal range of motion. Neck supple.  Cardiovascular: Normal rate, regular rhythm and normal heart sounds.  No murmur heard. Pulmonary/Chest: Effort normal and breath sounds normal. No respiratory distress. She has no wheezes. She has no rales.  Abdominal: Soft. Bowel sounds are normal. There is tenderness (mild epigastric pain).  Musculoskeletal: She exhibits no edema.  Lymphadenopathy:    She has no cervical adenopathy.  Neurological: She is alert and oriented to person, place, and time. No cranial nerve deficit.  Skin: Skin is warm and dry. No rash noted. No erythema. No pallor.  Psychiatric: She has a normal mood and affect. Her behavior is normal. Thought content normal.  Vitals  reviewed.    Assessment & Plan:   1. Acute viral syndrome - Begin acetaminophen (TYLENOL) 500 MG tablet; Take 2 tablets (1,000 mg total) by mouth every 8 (eight) hours as needed for up to 7 days.  Dispense: 21 tablet; Refill: 0  2.  Non-intractable vomiting with nausea, unspecified vomiting type - Begin promethazine (PHENERGAN) 25 MG tablet; Take 1 tablet (25 mg total) by mouth every 8 (eight) hours as needed for nausea or vomiting.  Dispense: 15 tablet; Refill: 0  3. Abdominal pain, epigastric - Begin omeprazole (PRILOSEC) 40 MG capsule; Take 1 capsule (40 mg total) by mouth daily.  Dispense: 30 capsule; Refill: 3  4. Screening for diabetes mellitus (DM) - HgB A1c 5.4%    Meds ordered this encounter  Medications  . promethazine (PHENERGAN) 25 MG tablet    Sig: Take 1 tablet (25 mg total) by mouth every 8 (eight) hours as needed for nausea or vomiting.    Dispense:  15 tablet    Refill:  0    Order Specific Question:   Supervising Provider    Answer:   Quentin AngstJEGEDE, OLUGBEMIGA E [id number]]  . omeprazole (PRILOSEC) 40 MG capsule    Sig: Take 1 capsule (40 mg total) by mouth daily.    Dispense:  30 capsule    Refill:  3    Order Specific Question:   Supervising Provider    Answer:   Quentin AngstJEGEDE, OLUGBEMIGA E [id number]]  . acetaminophen (TYLENOL) 500 MG tablet    Sig: Take 2 tablets (1,000 mg total) by mouth every 8 (eight) hours as needed for up to 7 days.    Dispense:  21 tablet    Refill:  0    Order Specific Question:   Supervising Provider    Answer:   Quentin AngstJEGEDE, OLUGBEMIGA E [id number]]    Follow-up: Return if symptoms worsen or fail to improve.   [name] Specteroger David Gomez PA

## 2018-01-29 LAB — BASIC METABOLIC PANEL
BUN / CREAT RATIO: 26 (ref 12–28)
BUN: 17 mg/dL (ref 8–27)
CO2: 21 mmol/L (ref 20–29)
Calcium: 9.3 mg/dL (ref 8.7–10.3)
Chloride: 104 mmol/L (ref 96–106)
Creatinine, Ser: 0.65 mg/dL (ref 0.57–1.00)
GFR calc non Af Amer: 92 mL/min/{1.73_m2} (ref >59)
GFR, EST AFRICAN AMERICAN: 105 mL/min/{1.73_m2} (ref >59)
GLUCOSE: 103 mg/dL — AB (ref 65–99)
POTASSIUM: 3.9 mmol/L (ref 3.5–5.2)
Sodium: 142 mmol/L (ref 134–144)

## 2018-01-29 LAB — CBC WITH DIFFERENTIAL/PLATELET
BASOS ABS: 0 10*3/uL (ref 0.0–0.2)
Basos: 1 %
EOS (ABSOLUTE): 0.1 10*3/uL (ref 0.0–0.4)
Eos: 2 %
Hematocrit: 36.5 % (ref 34.0–46.6)
Hemoglobin: 12.6 g/dL (ref 11.1–15.9)
IMMATURE GRANS (ABS): 0 10*3/uL (ref 0.0–0.1)
IMMATURE GRANULOCYTES: 0 %
LYMPHS: 31 %
Lymphocytes Absolute: 1.8 10*3/uL (ref 0.7–3.1)
MCH: 31.9 pg (ref 26.6–33.0)
MCHC: 34.5 g/dL (ref 31.5–35.7)
MCV: 92 fL (ref 79–97)
MONOS ABS: 0.5 10*3/uL (ref 0.1–0.9)
Monocytes: 9 %
NEUTROS PCT: 57 %
Neutrophils Absolute: 3.3 10*3/uL (ref 1.4–7.0)
Platelets: 302 10*3/uL (ref 150–379)
RBC: 3.95 x10E6/uL (ref 3.77–5.28)
RDW: 13.3 % (ref 12.3–15.4)
WBC: 5.8 10*3/uL (ref 3.4–10.8)

## 2018-02-04 MED FILL — LOVASTATIN 20 MG TABS: 20 | 30 days supply | Qty: 60 | Fill #0

## 2018-02-04 MED FILL — LISINOPRIL-HCTZ 20-12.5 MG: 20-12.5 | 30 days supply | Qty: 60 | Fill #1

## 2018-02-04 MED FILL — MELOXICAM 15 MG TABLET: 15 | 30 days supply | Qty: 30 | Fill #0

## 2018-02-05 ENCOUNTER — Telehealth (INDEPENDENT_AMBULATORY_CARE_PROVIDER_SITE_OTHER): Payer: Self-pay | Admitting: *Deleted

## 2018-02-05 NOTE — Telephone Encounter (Signed)
-----   Message from Loletta Specteroger David Gomez, PA-C sent at 01/29/2018 12:23 PM EST ----- No bacteria, she likely has viral syndrome.

## 2018-02-05 NOTE — Telephone Encounter (Signed)
Medical Assistant used [employer] to contact patient.  Interpreter Name: [facility] Interpreter #: [phone] Patient was not available, [employer] left patient a voicemail. Please inform patient of having viral symptoms and nothing bacterial being noted. Voicemail states to give a call back to [place] [facility] at [phone].

## 2018-02-13 ENCOUNTER — Encounter (HOSPITAL_COMMUNITY): Payer: Self-pay | Admitting: Orthopedic Surgery

## 2018-02-24 ENCOUNTER — Ambulatory Visit (INDEPENDENT_AMBULATORY_CARE_PROVIDER_SITE_OTHER): Payer: Self-pay | Admitting: Orthopedic Surgery

## 2018-02-24 ENCOUNTER — Encounter (INDEPENDENT_AMBULATORY_CARE_PROVIDER_SITE_OTHER): Payer: Self-pay | Admitting: Orthopedic Surgery

## 2018-02-24 VITALS — Ht 59.0 in | Wt 183.0 lb

## 2018-02-24 DIAGNOSIS — Z981 Arthrodesis status: Secondary | ICD-10-CM

## 2018-02-24 NOTE — Progress Notes (Signed)
Office Visit Note   Patient: [name] [name]           Date of Birth: 11/12/1951           MRN: [id number] Visit Date: 02/24/2018              Requested by: Loletta SpecterGomez, Roger David, PA-C [address] [place], [place] [id number] PCP: Loletta SpecterGomez, Roger David, PA-C  Chief Complaint  Patient presents with  . Right Ankle - Routine Post Op    12/24/17 right tibial calcaneal fusion       HPI: Patient presents 2 months status post right tibial calcaneal fusion.  Patient is currently ambulating in a fracture boot she states her leg does get tired with activities she has some start up stiffness but otherwise she states she is doing well.  Assessment & Plan: Visit Diagnoses:  1. H/O ankle fusion     Plan: Patient will wean herself out of the fracture boot starting in the home.  Continue to use the walker for support.  Obtain 2 view radiographs of the right ankle at follow-up.  Follow-Up Instructions: Return in about 4 weeks (around 03/24/2018).   Ortho Exam  Patient is alert, oriented, no adenopathy, well-dressed, normal affect, normal respiratory effort. Examination patient's foot is plantigrade.  Her incisions are well-healed she has no redness no cellulitis no signs of infection there is no pain with range of motion of the midfoot.  Imaging: No results found. No images are attached to the encounter.  Labs: Lab Results  Component Value Date   HGBA1C 5.4 01/28/2018   HGBA1C 5.6 11/08/2013   ESRSEDRATE 20 09/17/2007   REPTSTATUS 01/11/2008 FINAL 01/09/2008   CULT  01/09/2008    LACTOBACILLUS SPECIES Note: Standardized susceptibility testing for this organism is not available.   LABORGA Normal Upper Respiratory Flora 07/28/2014   LABORGA No Beta Hemolytic Streptococci Isolated 07/28/2014    @LABSALLVALUES (HGBA1)@  Body mass index is 36.96 kg/m.  Orders:  No orders of the defined types were placed in this encounter.  No orders of the defined types were placed in this  encounter.    Procedures: No procedures performed  Clinical Data: No additional findings.  ROS:  All other systems negative, except as noted in the HPI. Review of Systems  Objective: Vital Signs: Ht 4\' 11"  (1.499 m)   Wt 183 lb (83 kg)   BMI 36.96 kg/m   Specialty Comments:  No specialty comments available.  PMFS History: Patient Active Problem List   Diagnosis Date Noted  . 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/2017  . Post-traumatic osteoarthritis, right ankle and foot   . Seasonal allergic rhinitis due to pollen 05/12/2017  . Allergy   . Cataract 01/28/2017  . Presbyopia of both eyes 01/28/2017  . Pterygium eye, 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  . Astigmatism, 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  . 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 Acquired bilateral flat feet 09/07/2015  . Osteoporosis 09/07/2015  . Transaminasemia 01/26/2015  . Generalized anxiety disorder 01/26/2015  . Essential hypertension 07/28/2014  . Gastritis 07/28/2014  . 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  . OBESITY 05/10/2009  . NEPHROLITHIASIS 06/15/2008  . Dyslipidemia 03/16/2008  . 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 BUNDLE BRANCH BLOCK, LEFT 12/11/2007  . PTSD 10/01/2007  . ARM PAIN, LEFT 10/01/2007   Past Medical History:  Diagnosis Date  . Allergy    spring pollen  . Arthritis   . Astigmatism, 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  . Cataract 01/28/2017  . High cholesterol   . History of kidney stones   .  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 Hypertension   . Osteoarthritis of ankle, right    and Subtalar Joint  . Osteoporosis   . Presbyopia of both eyes 01/28/2017  . Pterygium eye, 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    History reviewed. No pertinent family history.  Past Surgical History:  Procedure Laterality Date  . ANKLE FUSION Right 12/24/2017   Tibiocalcaneal fusion  . ANKLE FUSION Right 12/24/2017   Procedure: RIGHT TIBIOCALCANEAL FUSION;  Surgeon: Nadara Mustard, MD;  Location: [facility] OR;  Service: Orthopedics;  Laterality: Right;  .  CHOLECYSTECTOMY     Social History   Occupational History  . Not on file  Tobacco Use  . Smoking status: Never Smoker  . Smokeless tobacco: Never Used  Substance and Sexual Activity  . Alcohol use: No    Alcohol/week: 0.0 oz  . Drug use: No  . Sexual activity: Not on file

## 2018-03-24 ENCOUNTER — Ambulatory Visit (INDEPENDENT_AMBULATORY_CARE_PROVIDER_SITE_OTHER): Payer: Self-pay | Admitting: Orthopedic Surgery

## 2018-03-26 MED FILL — LOVASTATIN 20 MG TABS: 20 | 30 days supply | Qty: 60 | Fill #1

## 2018-04-01 ENCOUNTER — Ambulatory Visit (INDEPENDENT_AMBULATORY_CARE_PROVIDER_SITE_OTHER): Payer: Self-pay | Admitting: Orthopedic Surgery

## 2018-04-01 ENCOUNTER — Ambulatory Visit (INDEPENDENT_AMBULATORY_CARE_PROVIDER_SITE_OTHER): Payer: Self-pay

## 2018-04-01 ENCOUNTER — Encounter (INDEPENDENT_AMBULATORY_CARE_PROVIDER_SITE_OTHER): Payer: Self-pay | Admitting: Orthopedic Surgery

## 2018-04-01 VITALS — Ht 59.0 in | Wt 183.0 lb

## 2018-04-01 DIAGNOSIS — Z981 Arthrodesis status: Secondary | ICD-10-CM

## 2018-04-01 DIAGNOSIS — M25571 Pain in right ankle and joints of right foot: Secondary | ICD-10-CM

## 2018-04-01 NOTE — Progress Notes (Signed)
Office Visit Note   Patient: [name] [name]           Date of Birth: Jan 17, 1951           MRN: [id number] Visit Date: 04/01/2018              Requested by: Loletta Specter, PA-C [address] [place], [place] [address] PCP: Loletta Specter, PA-C  Chief Complaint  Patient presents with  . Right Ankle - Follow-up    12/24/17 right tib/cal fusion      HPI: Patient is a 67 year old woman who presents over 3 months out from right tibial calcaneal fusion.  Patient states she is slowly getting better she states that she does have some heel pain with prolonged activities she states she feels better in the fracture boot when she does have pain she is using regular shoes and a quad cane at this time she is seen with an interpreter.  Assessment & Plan: Visit Diagnoses:  1. Pain in right ankle and joints of right foot   2. H/O ankle fusion     Plan: Recommended new orthotics discussed that if she is on her feet for prolonged periods of time to use the fracture boot with the orthotic.  Repeat 2 view radiographs of the right ankle at follow-up.  Follow-Up Instructions: Return in about 1 month (around 04/29/2018).   Ortho Exam  Patient is alert, oriented, no adenopathy, well-dressed, normal affect, normal respiratory effort. Examination patient's foot is plantigrade her ankles at 90 degrees there is no redness no cellulitis no signs of infection patient does have some scar tissue over the heel pad of the calcaneus this is most likely the source of pain.  Patient has a good pulse  Imaging: Xr Ankle 2 Views Right  Result Date: 04/01/2018 2 view radiographs of the right ankle shows stable alignment no complicating features there is no prominent hardware.  No images are attached to the encounter.  Labs: Lab Results  Component Value Date   HGBA1C 5.4 01/28/2018   HGBA1C 5.6 11/08/2013   ESRSEDRATE 20 09/17/2007   REPTSTATUS 01/11/2008 FINAL 01/09/2008   CULT  01/09/2008      LACTOBACILLUS SPECIES Note: Standardized susceptibility testing for this organism is not available.   LABORGA Normal Upper Respiratory Flora 07/28/2014   LABORGA No Beta Hemolytic Streptococci Isolated 07/28/2014    @LABSALLVALUES (HGBA1)@  Body mass index is 36.96 kg/m.  Orders:  Orders Placed This Encounter  Procedures  . XR Ankle 2 Views Right   No orders of the defined types were placed in this encounter.    Procedures: No procedures performed  Clinical Data: No additional findings.  ROS:  All other systems negative, except as noted in the HPI. Review of Systems  Objective: Vital Signs: Ht 4\' 11"  (1.499 m)   Wt 183 lb (83 kg)   BMI 36.96 kg/m   Specialty Comments:  No specialty comments available.  PMFS History: Patient Active Problem List   Diagnosis Date Noted  . 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/2017  . Post-traumatic osteoarthritis, right ankle and foot   . Seasonal allergic rhinitis due to pollen 05/12/2017  . Allergy   . Cataract 01/28/2017  . Presbyopia of both eyes 01/28/2017  . Pterygium eye, 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  . Astigmatism, 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  . 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 Acquired bilateral flat feet 09/07/2015  . Osteoporosis 09/07/2015  . Transaminasemia 01/26/2015  . Generalized anxiety disorder 01/26/2015  . Essential hypertension 07/28/2014  . Gastritis  07/28/2014  . 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  . OBESITY 05/10/2009  . NEPHROLITHIASIS 06/15/2008  . Dyslipidemia 03/16/2008  . 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 BUNDLE BRANCH BLOCK, LEFT 12/11/2007  . PTSD 10/01/2007  . ARM PAIN, LEFT 10/01/2007   Past Medical History:  Diagnosis Date  . Allergy    spring pollen  . Arthritis   . Astigmatism, 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  . Cataract 01/28/2017  . High cholesterol   . History of kidney stones   . 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 Hypertension   . Osteoarthritis of ankle, right    and Subtalar Joint  . Osteoporosis    . Presbyopia of both eyes 01/28/2017  . Pterygium eye, 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    History reviewed. No pertinent family history.  Past Surgical History:  Procedure Laterality Date  . ANKLE FUSION Right 12/24/2017   Tibiocalcaneal fusion  . ANKLE FUSION Right 12/24/2017   Procedure: RIGHT TIBIOCALCANEAL FUSION;  Surgeon: Nadara Mustarduda, Alexzia Kasler V, MD;  [facility];  Service: Orthopedics;  Laterality: Right;  . CHOLECYSTECTOMY     Social History   Occupational History  . Not on file  Tobacco Use  . Smoking status: Never Smoker  . Smokeless tobacco: Never Used  Substance and Sexual Activity  . Alcohol use: No    Alcohol/week: 0.0 oz  . Drug use: No  . Sexual activity: Not on file

## 2018-05-05 ENCOUNTER — Ambulatory Visit (INDEPENDENT_AMBULATORY_CARE_PROVIDER_SITE_OTHER): Payer: Self-pay | Admitting: Orthopedic Surgery

## 2018-05-05 MED FILL — LISINOPRIL-HCTZ 20-12.5 MG: 20-12.5 | 30 days supply | Qty: 60 | Fill #2

## 2018-05-05 MED FILL — LOVASTATIN 20 MG TABLET: 20 | 30 days supply | Qty: 60 | Fill #2

## 2018-05-20 ENCOUNTER — Ambulatory Visit (INDEPENDENT_AMBULATORY_CARE_PROVIDER_SITE_OTHER): Payer: Self-pay | Admitting: Orthopedic Surgery

## 2018-05-21 ENCOUNTER — Encounter (INDEPENDENT_AMBULATORY_CARE_PROVIDER_SITE_OTHER): Payer: Self-pay | Admitting: Orthopedic Surgery

## 2018-05-21 ENCOUNTER — Ambulatory Visit (INDEPENDENT_AMBULATORY_CARE_PROVIDER_SITE_OTHER): Payer: Self-pay | Admitting: Orthopedic Surgery

## 2018-05-21 DIAGNOSIS — Z981 Arthrodesis status: Secondary | ICD-10-CM

## 2018-05-21 NOTE — Progress Notes (Signed)
Office Visit Note   Patient: [name] [name]           Date of Birth: 1951-01-06           MRN: [id number] Visit Date: 05/21/2018              Requested by: Loletta Specter, PA-C [address] [place], [place] [address] PCP: Loletta Specter, PA-C  Chief Complaint  Patient presents with  . Right Foot - Follow-up, Routine Post Op, Pain      HPI: Patient is a 67 year old woman status post tibial calcaneal fusion on the right.  She is about 5 months out from surgery she states occasionally has some numbness over the lateral aspect of her foot with walking with elevation and rest this numbness.  Assessment & Plan: Visit Diagnoses:  1. H/O ankle fusion     Plan: Continue wearing the compression socks the custom orthotics extra-depth shoes follow-up if there is any change in her condition.  Follow-Up Instructions: Return if symptoms worsen or fail to improve.   Ortho Exam  Patient is alert, oriented, no adenopathy, well-dressed, normal affect, normal respiratory effort. Examination patient does have an antalgic gait her foot is plantigrade there is no ulcers no calluses no cellulitis no signs of pressure points.  She has a little bit of venous stasis swelling but no other problems around the ankle there is no brawny skin color changes.  Attempted movement of the tibial calcaneal fusion is asymptomatic.  Imaging: No results found. No images are attached to the encounter.  Labs: Lab Results  Component Value Date   HGBA1C 5.4 01/28/2018   HGBA1C 5.6 11/08/2013   ESRSEDRATE 20 09/17/2007   REPTSTATUS 01/11/2008 FINAL 01/09/2008   CULT  01/09/2008    LACTOBACILLUS SPECIES Note: Standardized susceptibility testing for this organism is not available.   LABORGA Normal Upper Respiratory Flora 07/28/2014   LABORGA No Beta Hemolytic Streptococci Isolated 07/28/2014     Lab Results  Component Value Date   ALBUMIN 3.7 12/17/2017   ALBUMIN 4.5 07/18/2017   ALBUMIN  3.8 09/30/2016    There is no height or weight on file to calculate BMI.  Orders:  No orders of the defined types were placed in this encounter.  No orders of the defined types were placed in this encounter.    Procedures: No procedures performed  Clinical Data: No additional findings.  ROS:  All other systems negative, except as noted in the HPI. Review of Systems  Objective: Vital Signs: There were no vitals taken for this visit.  Specialty Comments:  No specialty comments available.  PMFS History: Patient Active Problem List   Diagnosis Date Noted  . 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/2017  . Post-traumatic osteoarthritis, right ankle and foot   . Seasonal allergic rhinitis due to pollen 05/12/2017  . Allergy   . Cataract 01/28/2017  . Presbyopia of both eyes 01/28/2017  . Pterygium eye, 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  . Astigmatism, 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  . 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 Acquired bilateral flat feet 09/07/2015  . Osteoporosis 09/07/2015  . Transaminasemia 01/26/2015  . Generalized anxiety disorder 01/26/2015  . Essential hypertension 07/28/2014  . Gastritis 07/28/2014  . 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  . OBESITY 05/10/2009  . NEPHROLITHIASIS 06/15/2008  . Dyslipidemia 03/16/2008  . 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 BUNDLE BRANCH BLOCK, LEFT 12/11/2007  . PTSD 10/01/2007  . ARM PAIN, LEFT 10/01/2007   Past Medical History:  Diagnosis Date  . Allergy  spring pollen  . Arthritis   . Astigmatism, 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  . Cataract 01/28/2017  . High cholesterol   . History of kidney stones   . 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 Hypertension   . Osteoarthritis of ankle, right    and Subtalar Joint  . Osteoporosis   . Presbyopia of both eyes 01/28/2017  . Pterygium eye, 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    History reviewed. No pertinent family history.  Past Surgical History:  Procedure Laterality Date  . ANKLE FUSION Right 12/24/2017   Tibiocalcaneal  fusion  . ANKLE FUSION Right 12/24/2017   Procedure: RIGHT TIBIOCALCANEAL FUSION;  Surgeon: Nadara Mustard, MD;  Location: [facility] OR;  Service: Orthopedics;  Laterality: Right;  . CHOLECYSTECTOMY     Social History   Occupational History  . Not on file  Tobacco Use  . Smoking status: Never Smoker  . Smokeless tobacco: Never Used  Substance and Sexual Activity  . Alcohol use: No    Alcohol/week: 0.0 oz  . Drug use: No  . Sexual activity: Not on file

## 2018-05-25 ENCOUNTER — Ambulatory Visit: Payer: Self-pay | Attending: Physician Assistant

## 2018-06-22 ENCOUNTER — Ambulatory Visit (INDEPENDENT_AMBULATORY_CARE_PROVIDER_SITE_OTHER): Payer: Self-pay | Admitting: Physician Assistant

## 2018-06-22 ENCOUNTER — Encounter (INDEPENDENT_AMBULATORY_CARE_PROVIDER_SITE_OTHER): Payer: Self-pay | Admitting: Physician Assistant

## 2018-06-22 VITALS — BP 150/80 | HR 64 | Temp 98.0°F | Resp 16 | Ht <= 58 in | Wt 185.8 lb

## 2018-06-22 DIAGNOSIS — E785 Hyperlipidemia, unspecified: Secondary | ICD-10-CM

## 2018-06-22 DIAGNOSIS — Z8739 Personal history of other diseases of the musculoskeletal system and connective tissue: Secondary | ICD-10-CM

## 2018-06-22 DIAGNOSIS — E2839 Other primary ovarian failure: Secondary | ICD-10-CM

## 2018-06-22 DIAGNOSIS — M810 Age-related osteoporosis without current pathological fracture: Secondary | ICD-10-CM

## 2018-06-22 DIAGNOSIS — Z1239 Encounter for other screening for malignant neoplasm of breast: Secondary | ICD-10-CM

## 2018-06-22 DIAGNOSIS — I10 Essential (primary) hypertension: Secondary | ICD-10-CM

## 2018-06-22 DIAGNOSIS — Z760 Encounter for issue of repeat prescription: Secondary | ICD-10-CM

## 2018-06-22 DIAGNOSIS — Z1231 Encounter for screening mammogram for malignant neoplasm of breast: Secondary | ICD-10-CM

## 2018-06-22 MED ORDER — LOVASTATIN 20 MG PO TABS: 40.0000 mg | ORAL_TABLET | Freq: Every day | ORAL | 3 refills | Status: DC

## 2018-06-22 MED ORDER — LISINOPRIL-HYDROCHLOROTHIAZIDE 20-12.5 MG PO TABS: 2.0000 | ORAL_TABLET | Freq: Every day | ORAL | 11 refills | Status: DC

## 2018-06-22 MED ORDER — CALCIUM CARB-CHOLECALCIFEROL 600-800 MG-UNIT PO TABS: 2.0000 | ORAL_TABLET | Freq: Every day | ORAL | 11 refills | Status: DC

## 2018-06-22 MED FILL — LOVASTATIN 20 MG TABLET: 20 | 30 days supply | Qty: 60 | Fill #0

## 2018-06-22 MED FILL — ?LISINOPRIL-HCTZ 20/12.5 TA: 20-12.5 | 30 days supply | Qty: 60 | Fill #0

## 2018-06-22 NOTE — Progress Notes (Signed)
Subjective:  Patient ID: [name] [name], female    DOB: 11/20/1951  Age: 67 y.o. MRN: [id number]  CC: HTN f/u  HPI  [name] Vazquez-Garciais a 67 y.o.femalewith a PMH of HTN, HLD, osteoporosis, cataract, astigmatism, hypermetropia, presbyopia, pterygium, and peroneal tendinitis bilaterally presents for HTN f/u. BP 150/80 mmHg today. Did not take medications as she ran out of medications today. Does not endorse CP, palpitations, SOB, HA, tingling, numbness, abdominal pain, f/c/n/v, rash, or GI/GU sxs.     Records show patient has not gone for mammography or bone density studies. Pt says she depends on transportation from a friend of the family and could not make it to her appointments. Would like to have imaging reordered.     Outpatient Medications Prior to Visit  Medication Sig Dispense Refill  . Calcium Carb-Cholecalciferol (CALTRATE 600+D) 600-800 MG-UNIT TABS Take 2 tablets by mouth daily. 60 tablet 11  . lisinopril-hydrochlorothiazide (ZESTORETIC) 20-12.5 MG tablet Take 2 tablets by mouth daily. 180 tablet 3  . lovastatin (MEVACOR) 20 MG tablet Take 2 tablets (40 mg total) by mouth at bedtime. 180 tablet 3  . meloxicam (MOBIC) 15 MG tablet Take 1 tablet (15 mg total) by mouth daily. 30 tablet 0  . omeprazole (PRILOSEC) 40 MG capsule Take 1 capsule (40 mg total) by mouth daily. 30 capsule 3  . promethazine (PHENERGAN) 25 MG tablet Take 1 tablet (25 mg total) by mouth every 8 (eight) hours as needed for nausea or vomiting. 15 tablet 0   No facility-administered medications prior to visit.      ROS Review of Systems  Constitutional: Negative for chills, fever and malaise/fatigue.  Eyes: Negative for blurred vision.  Respiratory: Negative for shortness of breath.   Cardiovascular: Negative for chest pain and palpitations.  Gastrointestinal: Negative for abdominal pain and nausea.  Genitourinary: Negative for dysuria and hematuria.  Musculoskeletal: Negative for joint pain  and myalgias.  Skin: Negative for rash.  Neurological: Negative for tingling and headaches.  Psychiatric/Behavioral: Negative for depression. The patient is not nervous/anxious.     Objective:  There were no vitals taken for this visit.  BP/Weight 04/01/2018 02/24/2018 01/28/2018  Systolic BP - - 135  Diastolic BP - - 77  Wt. (Lbs) 183 183 183  BMI 36.96 36.96 36.96      Physical Exam  Constitutional: She is oriented to person, place, and time.  Well developed, obese, NAD, polite  HENT:  Head: Normocephalic and atraumatic.  Eyes: No scleral icterus.  Neck: Normal range of motion. Neck supple. No thyromegaly present.  Cardiovascular: Normal rate, regular rhythm and normal heart sounds. Exam reveals no gallop and no friction rub.  No murmur heard. No carotid bruit bilaterally.  Pulmonary/Chest: Effort normal and breath sounds normal.  Musculoskeletal: She exhibits no edema.  Neurological: She is alert and oriented to person, place, and time.  Skin: Skin is warm and dry. No rash noted. No erythema. No pallor.  Psychiatric: She has a normal mood and affect. Her behavior is normal. Thought content normal.  Vitals reviewed.    Assessment & Plan:   1. Hypertension, unspecified type - lisinopril-hydrochlorothiazide (ZESTORETIC) 20-12.5 MG tablet; Take 2 tablets by mouth daily.  Dispense: 60 tablet; Refill: 11 - Comprehensive metabolic panel; Future  2. Hyperlipidemia, unspecified hyperlipidemia type - Lipid panel; Future - Comprehensive metabolic panel; Future - lovastatin (MEVACOR) 20 MG tablet; Take 2 tablets (40 mg total) by mouth at bedtime.  Dispense: 180 tablet; Refill: 3  3. Osteoporosis, unspecified osteoporosis  type, unspecified pathological fracture presence - Bone density ordered twice before by outside provider. Reordered today. I reached out to Ms. Lucilla Lame to contact patient and help fill out forms for free testing through a state sponsored program.   4.  Medication refill - Calcium Carb-Cholecalciferol (CALTRATE 600+D) 600-800 MG-UNIT TABS; Take 2 tablets by mouth daily.  Dispense: 60 tablet; Refill: 11  5. Hx of osteoporosis - DG Bone Density; Future  6. Estrogen deficiency - DG Bone Density; Future   Meds ordered this encounter  Medications  . lisinopril-hydrochlorothiazide (ZESTORETIC) 20-12.5 MG tablet    Sig: Take 2 tablets by mouth daily.    Dispense:  60 tablet    Refill:  11    Order Specific Question:   Supervising Provider    Answer:   Hoy Register [[id number]]  . lovastatin (MEVACOR) 20 MG tablet    Sig: Take 2 tablets (40 mg total) by mouth at bedtime.    Dispense:  180 tablet    Refill:  3    Order Specific Question:   Supervising Provider    Answer:   Hoy Register [[id number]]  . Calcium Carb-Cholecalciferol (CALTRATE 600+D) 600-800 MG-UNIT TABS    Sig: Take 2 tablets by mouth daily.    Dispense:  60 tablet    Refill:  11    Order Specific Question:   Supervising Provider    Answer:   Hoy Register [[id number]]    Follow-up: Return in about 6 weeks (around 08/03/2018) for HTN.   Loletta Specter PA

## 2018-06-22 NOTE — Patient Instructions (Signed)
 Cmo controlar su hipertensin Managing Your Hypertension La hipertensin se denomina usualmente presin arterial alta. Ocurre cuando la sangre presiona contra las paredes de las arterias con demasiada fuerza. Las arterias son los vasos sanguneos que transportan la sangre desde el corazn hacia todas las partes del cuerpo. La hipertensin hace que el corazn haga ms esfuerzo para bombear sangre y puede provocar que las arterias se estrechen o endurezcan. La hipertensin no tratada o no controlada puede causar infarto de miocardio, accidente cerebrovascular, enfermedad renal y otros problemas. Qu son las lecturas de presin arterial? Una lectura de la presin arterial consiste de un nmero ms alto sobre un nmero ms bajo. En condiciones ideales, la presin arterial debe estar por debajo de 120/80. El primer nmero ("superior") es la presin sistlica. Es la medida de la presin de las arterias cuando el corazn late. El segundo nmero ("inferior") es la presin diastlica. Es la medida de la presin en las arterias cuando el corazn se relaja. Qu significa mi lectura de presin arterial? La presin arterial se clasifica en cuatro etapas. Sobre la base de la lectura de su presin arterial, el mdico puede usar las siguientes etapas para determinar si necesita tratamiento y de qu tipo. La presin sistlica y la presin diastlica se miden en una unidad llamada mm Hg. Normal  Presin sistlica: por debajo de 120.  Presin diastlica: por debajo de 80. Elevada  Presin sistlica: 120-129.  Presin diastlica: por debajo de 80. Etapa 1 de hipertensin  Presin sistlica: 130-139.  Presin diastlica: 80-89. Etapa 2 de hipertensin  Presin sistlica: 140 o ms.  Presin diastlica: 90 o ms. Cules son los riesgos para la salud asociados con la hipertensin? Controlar la hipertensin es una responsabilidad importante. La hipertensin no controlada puede causar:  Infarto de  miocardio.  Accidente cerebrovascular.  Debilitamiento de los vasos sanguneos (aneurisma).  Insuficiencia cardaca.  Dao renal.  Dao ocular.  Sndrome metablico.  Problemas de memoria y concentracin.  Qu cambios puedo hacer para controlar mi hipertensin? La hipertensin se puede controlar haciendo cambios en el estilo de vida y, posiblemente, tomando medicamentos. Su mdico le ayudar a crear un plan para bajar la presin arterial al rango normal. Comida y bebida  Siga una dieta con alto contenido de fibras y potasio, y con bajo contenido de sal (sodio), azcar agregada y grasas. Un ejemplo de plan alimenticio es la dieta DASH (Dietary Approaches to Stop Hypertension, Mtodos alimenticios para detener la hipertensin). Para alimentarse de esta manera: ? Coma mucha fruta y verdura fresca. Trate de que la mitad del plato de cada comida sea de frutas y verduras. ? Coma cereales integrales, como pasta integral, arroz integral y pan integral. Llene aproximadamente un cuarto del plato con cereales integrales. ? Consuma productos lcteos con bajo contenido de grasa. ? Evite la ingesta de cortes de carne grasa, carne procesada o curada, y carne de ave con piel. Llene aproximadamente un cuarto del plato con protenas magras, como pescado, pollo sin piel, frijoles, huevos y tofu. ? Evite ingerir alimentos prehechos o procesados. En general, estos tienen mayor cantidad de sodio, azcar agregada y grasa.  Reduzca su ingesta diaria de sodio. La mayora de las personas que tienen hipertensin deben comer menos de 1500 mg de sodio por da.  Limite el consumo de alcohol a no ms de 1 medida por da si es mujer y no est embarazada y a 2 medidas por da si es hombre. Una medida equivale a 12onzas de cerveza, 5onzas de   vino o 1onzas de bebidas alcohlicas de alta graduacin. Estilo de vida  Trabaje con su mdico para mantener un peso saludable o perder peso. Pregntele cual es su peso  recomendado.  Realice al menos 30 minutos de ejercicio que haga que se acelere su corazn (ejercicio aerbico) la mayora de los das de la semana. Estas actividades pueden incluir caminar, nadar o andar en bicicleta.  Incluya ejercicios para fortalecer sus msculos (ejercicios de resistencia), como levantamiento de pesas, como parte de su rutina semanal de ejercicios. Intente realizar 30minutos de este tipo de ejercicios al menos tres das a la semana.  No consuma ningn producto que contenga nicotina o tabaco, como cigarrillos y cigarrillos electrnicos. Si necesita ayuda para dejar de fumar, consulte al mdico.  Controle las enfermedades a largo plazo (crnicas), como el colesterol alto o la diabetes. Control  Contrlese la presin arterial en su casa segn las indicaciones del mdico. La presin arterial deseada puede variar en funcin de las enfermedades, la edad y otros factores personales.  Contrlese la presin arterial de manera regular, en la frecuencia indicada por su mdico. Trabaje con su mdico  Revise con su mdico todos los medicamentos que toma ya que puede haber efectos secundarios o interacciones.  Hable con su mdico acerca de la dieta, hbitos de ejercicio y otros factores del estilo de vida que pueden contribuir a la hipertensin.  Consulte a su mdico regularmente. Su mdico puede ayudarle a crear y ajustar su plan para controlar la hipertensin. Debo tomar un medicamento para controlar mi presin arterial? El mdico puede recetarle medicamentos si los cambios en el estilo de vida no son suficientes para lograr controlar la presin arterial y si:  Su presin arterial sistlica es de 130 o ms.  Su presin arterial diastlica es de 80 o ms.  Tome los medicamentos solamente como se lo haya indicado el mdico. Siga cuidadosamente las indicaciones. Los medicamentos para la presin arterial deben tomarse segn las indicaciones. Los medicamentos pierden eficacia al  omitir las dosis. El hecho de omitir las dosis tambin aumenta el riesgo de otros problemas. Comunquese con un mdico si:  Piensa que tiene una reaccin alrgica a los medicamentos que ha tomado.  Tiene dolores de cabeza frecuentes (recurrentes).  Siente mareos.  Tiene hinchazn en los tobillos.  Tiene problemas de visin. Solicite ayuda de inmediato si:  Siente un dolor de cabeza intenso o confusin.  Siente debilidad inusual, adormecimiento o que se desmayar.  Siente un dolor intenso en el pecho o el abdomen.  Vomita repetidas veces.  Tiene dificultad para respirar. Resumen  La hipertensin se produce cuando la sangre bombea en las arterias con mucha fuerza. Si esta afeccin no se controla, podra correr riesgo de tener complicaciones graves.  La presin arterial deseada puede variar en funcin de las enfermedades, la edad y otros factores personales. Para la mayora de las personas, una presin arterial normal es menor que 120/80.  La hipertensin se puede controlar mediante cambios en el estilo de vida, tomando medicamentos, o ambas cosas. Los cambios en el estilo de vida incluyen prdida de peso, ingerir alimentos sanos, seguir una dieta baja en sodio, hacer ms ejercicio y limitar el consumo de alcohol. Esta informacin no tiene como fin reemplazar el consejo del mdico. Asegrese de hacerle al mdico cualquier pregunta que tenga. Document Released: 09/02/2012 Document Revised: 11/20/2016 Document Reviewed: 11/20/2016 Elsevier Interactive Patient Education  2018 Elsevier Inc.  

## 2018-06-29 ENCOUNTER — Other Ambulatory Visit (HOSPITAL_COMMUNITY): Payer: Self-pay | Admitting: *Deleted

## 2018-06-29 DIAGNOSIS — N6310 Unspecified lump in the right breast, unspecified quadrant: Secondary | ICD-10-CM

## 2018-07-21 ENCOUNTER — Ambulatory Visit
Admission: RE | Admit: 2018-07-21 | Discharge: 2018-07-21 | Disposition: A | Discharge status: Home / Self Care | Payer: PRIVATE HEALTH INSURANCE | Source: Ambulatory Visit | Attending: Obstetrics and Gynecology | Admitting: Obstetrics and Gynecology

## 2018-07-21 ENCOUNTER — Encounter (HOSPITAL_COMMUNITY): Payer: Self-pay

## 2018-07-21 ENCOUNTER — Ambulatory Visit
Admission: RE | Admit: 2018-07-21 | Discharge: 2018-07-21 | Disposition: A | Discharge status: Home / Self Care | Payer: Self-pay | Source: Ambulatory Visit | Attending: Obstetrics and Gynecology | Admitting: Obstetrics and Gynecology

## 2018-07-21 ENCOUNTER — Ambulatory Visit (HOSPITAL_COMMUNITY)
Admission: RE | Admit: 2018-07-21 | Discharge: 2018-07-21 | Disposition: A | Discharge status: Home / Self Care | Payer: Self-pay | Source: Ambulatory Visit | Attending: Obstetrics and Gynecology | Admitting: Obstetrics and Gynecology

## 2018-07-21 VITALS — BP 142/84

## 2018-07-21 DIAGNOSIS — N6310 Unspecified lump in the right breast, unspecified quadrant: Secondary | ICD-10-CM

## 2018-07-21 DIAGNOSIS — Z1239 Encounter for other screening for malignant neoplasm of breast: Secondary | ICD-10-CM

## 2018-07-21 DIAGNOSIS — N6313 Unspecified lump in the right breast, lower outer quadrant: Secondary | ICD-10-CM

## 2018-07-21 NOTE — Patient Instructions (Signed)
Explained breast self awareness with [name]. Patient did not need a Pap smear today due to last Pap smear and HPV typing was 09/27/2013. Patient stated she was told that she doesn't need any further Pap smears due to she has no history of an abnormal Pap smear and her age. [facility] and possible right breast ultrasound. Appointment scheduled for Tuesday, July 21, 2018 at 1420. [name] verbalized understanding.  Varshini Arrants, Kathaleen Maserhristine Poll, RN 2:32 PM

## 2018-07-21 NOTE — Progress Notes (Signed)
Complaints of right breast lump since 2006. Patient complained of right breast pain x one year that comes and goes at times. Patient rates the pain at a 1-2 out of 10.  Pap Smear: Pap smear not completed today. [facility] and normal with negative HPV. Per patient has no history of an abnormal Pap smear. Last Pap smear result is in Epic. Patient stated she was told that she does not need any further Pap smears due to she has no history of an abnormal Pap smear and her age.  Physical exam: Breasts Breasts symmetrical. No skin abnormalities bilateral breasts. No nipple retraction bilateral breasts. No nipple discharge bilateral breasts. No lymphadenopathy. No lumps palpated left breast. Palpated a bb sized lump within the right breast at 8 o'clock 12 cm from the nipple. No complaints of pain or tenderness on exam. [facility] for a diagnostic mammogram and possible right breast ultrasound. Appointment scheduled for Tuesday, July 21, 2018 at 1420.        Pelvic/Bimanual No Pap smear completed today since last Pap smear and HPV typing was 09/27/2013. Pap smear not indicated per B[facility]CP guidelines.   Smoking History: Patient has never smoked.  Patient Navigation: Patient education provided. Access to services provided for [facility] through [facility]. Spanish interpreter provided.   Colorectal Cancer Screening: Per patient had a colonoscopy completed 3-4 years ago. No complaints today. FIT Test given to patient to complete and return to [facility]CP.  Breast and Cervical Cancer Risk Assessment: Patient has no family history of breast cancer, known genetic mutations, or radiation treatment to the chest before age 69. Patient has no history of cervical dysplasia, immunocompromised, or DES exposure in-utero.  Risk Assessment    Risk Scores      07/21/2018   Last edited by: Lynnell DikeHolland, Sabrina H, LPN   5-year  risk: 1 %   Lifetime risk: 3 %         Used Spanish interpreter Natale LayErika McReynolds from [facility].

## 2018-07-24 ENCOUNTER — Encounter (HOSPITAL_COMMUNITY): Payer: Self-pay | Admitting: *Deleted

## 2018-07-24 MED FILL — ?LISINOPRIL-HCTZ 20/12.5 TA: 20-12.5 | 30 days supply | Qty: 60 | Fill #1

## 2018-07-24 MED FILL — LOVASTATIN 20 MG TABLET: 20 | 30 days supply | Qty: 60 | Fill #1

## 2018-07-27 ENCOUNTER — Other Ambulatory Visit (INDEPENDENT_AMBULATORY_CARE_PROVIDER_SITE_OTHER): Payer: PRIVATE HEALTH INSURANCE

## 2018-07-27 DIAGNOSIS — E785 Hyperlipidemia, unspecified: Secondary | ICD-10-CM

## 2018-07-27 DIAGNOSIS — I10 Essential (primary) hypertension: Secondary | ICD-10-CM

## 2018-07-27 NOTE — Progress Notes (Signed)
Lab only appointment. Labs drawn by onsite [facility] phlebotomist. Tempestt S Roberts, CMA  

## 2018-07-28 ENCOUNTER — Other Ambulatory Visit: Payer: Self-pay | Admitting: Obstetrics and Gynecology

## 2018-07-28 LAB — COMPREHENSIVE METABOLIC PANEL
A/G RATIO: 1.4 (ref 1.2–2.2)
ALBUMIN: 4.3 g/dL (ref 3.6–4.8)
ALK PHOS: 84 IU/L (ref 39–117)
ALT: 14 IU/L (ref 0–32)
AST: 16 IU/L (ref 0–40)
BILIRUBIN TOTAL: 0.4 mg/dL (ref 0.0–1.2)
BUN / CREAT RATIO: 36 — AB (ref 12–28)
BUN: 20 mg/dL (ref 8–27)
CHLORIDE: 103 mmol/L (ref 96–106)
CO2: 23 mmol/L (ref 20–29)
Calcium: 9.8 mg/dL (ref 8.7–10.3)
Creatinine, Ser: 0.55 mg/dL — ABNORMAL LOW (ref 0.57–1.00)
GFR calc non Af Amer: 96 mL/min/{1.73_m2} (ref >59)
GFR, EST AFRICAN AMERICAN: 111 mL/min/{1.73_m2} (ref >59)
GLUCOSE: 94 mg/dL (ref 65–99)
Globulin, Total: 3 g/dL (ref 1.5–4.5)
POTASSIUM: 4.1 mmol/L (ref 3.5–5.2)
Sodium: 139 mmol/L (ref 134–144)
TOTAL PROTEIN: 7.3 g/dL (ref 6.0–8.5)

## 2018-07-28 LAB — LIPID PANEL
Chol/HDL Ratio: 3.4 ratio (ref 0.0–4.4)
Cholesterol, Total: 241 mg/dL — ABNORMAL HIGH (ref 100–199)
HDL: 70 mg/dL (ref >39)
LDL Calculated: 145 mg/dL — ABNORMAL HIGH (ref 0–99)
TRIGLYCERIDES: 128 mg/dL (ref 0–149)
VLDL Cholesterol Cal: 26 mg/dL (ref 5–40)

## 2018-07-30 LAB — FECAL OCCULT BLOOD, IMMUNOCHEMICAL: FECAL OCCULT BLD: NEGATIVE

## 2018-07-31 ENCOUNTER — Telehealth (INDEPENDENT_AMBULATORY_CARE_PROVIDER_SITE_OTHER): Payer: Self-pay

## 2018-07-31 NOTE — Telephone Encounter (Signed)
Call placed using pacific interpreter [phone]) left messages on both numbers asking patient to call [facility] at [phone]. Maryjean Mornempestt S Omir Cooprider, CMA

## 2018-07-31 NOTE — Telephone Encounter (Signed)
-----   Message from Loletta Specteroger David Gomez, PA-C sent at 07/28/2018  8:32 AM EDT ----- Cholesterol is elevated more than previous. Please take Lovastatin as directed. Eat less fatty/fried foods. Will recheck cholesterol in three months.

## 2018-08-03 ENCOUNTER — Ambulatory Visit (INDEPENDENT_AMBULATORY_CARE_PROVIDER_SITE_OTHER): Payer: Self-pay | Admitting: Physician Assistant

## 2018-08-05 ENCOUNTER — Encounter (INDEPENDENT_AMBULATORY_CARE_PROVIDER_SITE_OTHER): Payer: Self-pay | Admitting: Physician Assistant

## 2018-08-05 ENCOUNTER — Other Ambulatory Visit: Payer: Self-pay

## 2018-08-05 ENCOUNTER — Ambulatory Visit (INDEPENDENT_AMBULATORY_CARE_PROVIDER_SITE_OTHER): Payer: Self-pay | Admitting: Physician Assistant

## 2018-08-05 VITALS — BP 122/75 | HR 63 | Temp 98.2°F | Ht <= 58 in | Wt 187.6 lb

## 2018-08-05 DIAGNOSIS — I10 Essential (primary) hypertension: Secondary | ICD-10-CM

## 2018-08-05 DIAGNOSIS — Z23 Encounter for immunization: Secondary | ICD-10-CM

## 2018-08-05 NOTE — Progress Notes (Signed)
   Subjective:  Patient ID: [name] [name], female    DOB: 08/07/1951  Age: 67 y.o. MRN: [id number]  CC: f/u HTN  HPI [name] Vazquez-Garciais a 67 y.o.femalewith a PMH of HTN, HLD, osteoporosis, cataract, astigmatism, hypermetropia, presbyopia, pterygium, and peroneal tendinitis bilaterally presentsfor HTN f/u. Last BP 150/80 mmHg six weeks ago. Prescribed Zestoretic 20-12.5 mg two tablets qam. BP 122/75 mmHg today. Does not endorse CP, palpitations, SOB, HA, tingling, numbness, abdominal pain, f/c/n/v, rash, or GI/GU sxs.      Outpatient Medications Prior to Visit  Medication Sig Dispense Refill  . Calcium Carb-Cholecalciferol (CALTRATE 600+D) 600-800 MG-UNIT TABS Take 2 tablets by mouth daily. 60 tablet 11  . lisinopril-hydrochlorothiazide (ZESTORETIC) 20-12.5 MG tablet Take 2 tablets by mouth daily. 60 tablet 11  . lovastatin (MEVACOR) 20 MG tablet Take 2 tablets (40 mg total) by mouth at bedtime. 180 tablet 3   No facility-administered medications prior to visit.      ROS Review of Systems  Constitutional: Negative for chills, fever and malaise/fatigue.  Eyes: Negative for blurred vision.  Respiratory: Negative for shortness of breath.   Cardiovascular: Negative for chest pain and palpitations.  Gastrointestinal: Negative for abdominal pain and nausea.  Genitourinary: Negative for dysuria and hematuria.  Musculoskeletal: Negative for joint pain and myalgias.  Skin: Negative for rash.  Neurological: Negative for tingling and headaches.  Psychiatric/Behavioral: Negative for depression. The patient is not nervous/anxious.     Objective:  Ht 4\' 9"  (1.448 m)   Wt 187 lb 9.6 oz (85.1 kg)   BMI 40.60 kg/m   Vitals:   08/05/18 1335  BP: 122/75  Pulse: 63  Temp: 98.2 F (36.8 C)  TempSrc: Oral  SpO2: 97%  Weight: 187 lb 9.6 oz (85.1 kg)  Height: 4\' 9"  (1.448 m)     Physical Exam  Constitutional: She is oriented to person, place, and time.  Well developed,  well nourished, NAD, polite  HENT:  Head: Normocephalic and atraumatic.  Eyes: No scleral icterus.  Neck: Normal range of motion. Neck supple. No thyromegaly present.  Cardiovascular: Normal rate, regular rhythm and normal heart sounds.  1+ pitting edema of the LLE, trace pitting edema of the RLE  Pulmonary/Chest: Effort normal and breath sounds normal.  Abdominal: Soft. Bowel sounds are normal. There is no tenderness.  Musculoskeletal: She exhibits no edema.  Neurological: She is alert and oriented to person, place, and time.  Skin: Skin is warm and dry. No rash noted. No erythema. No pallor.  Psychiatric: She has a normal mood and affect. Her behavior is normal. Thought content normal.  Vitals reviewed.    Assessment & Plan:   1. Hypertension, unspecified type - Continue Zestoretic 20 - 12.5 mg two tablets qam  2. Need for Tdap vaccination - Tdap vaccine greater than or equal to 7yo IM   Follow-up: 6 months  Loletta Specteroger David Kelani Robart PA

## 2018-08-05 NOTE — Patient Instructions (Signed)
Vacuna Td (contra la difteria y el ttanos): Lo que debe saber (Td Vaccine [Tetanus and Diphtheria]: What You Need to Know) 1. Por qu vacunarse? El ttanos y la difteria son enfermedades muy graves. Son poco frecuentes en los [place] actualmente, pero las personas que se infectan suelen tener complicaciones graves. La vacuna Td se usa para proteger a los adolescentes y a los adultos de ambas enfermedades. Tanto el ttanos como la difteria son infecciones causadas por bacterias. La difteria se transmite de persona a persona a travs de la tos o el estornudo. La bacteria que causa el ttanos entra al cuerpo a travs de cortes, raspones o heridas. El TTANOS (trismo) provoca entumecimiento y contraccin dolorosa de los msculos, por lo general, en todo el cuerpo.  Puede causar el endurecimiento de los msculos de la cabeza y el cuello, de modo que impide abrir la boca, tragar y en algunos casos, respirar. El ttanos es causa de muerte en aproximadamente 1de cada 10personas que contraen la infeccin, incluso despus de que reciben la mejor atencin mdica. La DIFTERIA puede hacer que se forme una membrana gruesa en la parte posterior de la garganta.  Puede causar problemas respiratorios, parlisis, insuficiencia cardaca e incluso la muerte. Antes de las vacunas, en los [place] se informaban 200000 casos de difteria y cientos de casos de ttanos cada ao. Desde que comenz la vacunacin, los informes de casos de ambas enfermedades se han reducido en un 99%. 2. Vacuna Td La vacuna Td protege a adolescentes y adultos contra el ttanos y la difteria. La vacuna Td habitualmente se aplica como dosis de refuerzo cada 10aos, pero tambin puede administrarse antes si la persona sufre una quemadura o herida sucia y grave. A veces, en lugar de la vacuna Td, se recomienda una vacuna llamada Tdap, que protege contra la tosferina, adems de proteger contra el ttanos y la difteria. El mdico o la  persona que le aplique la vacuna puede darle ms informacin al respecto. La Td puede administrarse de manera segura simultneamente con otras vacunas. 3. Algunas personas no deben recibir la vacuna  Una persona que alguna vez ha tenido una reaccin alrgica potencialmente mortal a una dosis anterior de cualquier vacuna contra el ttanos o la difteria, O que tenga una alergia grave a cualquier parte de esta vacuna, no debe recibir la vacuna Td. Informe a la persona que le aplica la vacuna si usted tiene cualquier alergia grave.  Consulte con su mdico si: ? tuvo hinchazn o dolor intenso despus de recibir cualquier vacuna contra la difteria o el ttanos, ? alguna vez ha sufrido el sndrome de Guillain-Barr, ? no se siente bien el da en que se ha programado la vacuna.  4. Riesgos de una reaccin a la vacuna Con cualquier medicamento, incluyendo las vacunas, existe la posibilidad de que aparezcan efectos secundarios. Suelen ser leves y desaparecen por s solos. Tambin son posibles las reacciones graves, pero en raras ocasiones. La mayora de las personas a las que se les aplica la vacuna Td no tienen ningn problema. Problemas leves despus de la vacuna Td: (No interfirieron en otras actividades)  Dolor en el lugar donde se aplic la vacuna (alrededor de 8de cada 10personas)  Enrojecimiento o hinchazn en el lugar donde se aplic la vacuna (alrededor de 1de cada 4personas)  Fiebre leve (poco frecuente)  Dolor de cabeza (alrededor de 1de cada 4personas)  Cansancio (alrededor de 1de cada 4personas) Problemas moderados despus de la vacuna Td: (Interfirieron en otras actividades,   pero no requirieron atencin mdica)  Fiebre superior a 102F (38,8C) (poco frecuente) Problemas graves despus de la vacuna Td: (Impidieron realizar las actividades habituales; requirieron atencin mdica)  Hinchazn, dolor intenso, sangrado o enrojecimiento en el brazo en que se aplic la vacuna  (poco frecuente). Problemas que podran ocurrir despus de cualquier vacuna:  Las personas a veces se desmayan despus de un procedimiento mdico, incluida la vacunacin. Si permanece sentado o recostado durante 15 minutos puede ayudar a evitar los desmayos y las lesiones causadas por las cadas. Informe al mdico si se siente mareado, tiene cambios en la visin o zumbidos en los odos.  Algunas personas sienten un dolor intenso en el hombro y tienen dificultad para mover el brazo donde se coloc la vacuna. Esto sucede con muy poca frecuencia.  Cualquier medicamento puede causar una reaccin alrgica grave. Dichas reacciones son muy poco frecuentes con una vacuna (se calcula que menos de 1en un milln de dosis) y se producen de unos minutos a unas horas despus de la administracin. Al igual que con cualquier medicamento, existe una probabilidad muy remota de que una vacuna cause una lesin grave o la muerte. Se controla permanentemente la seguridad de las vacunas. Para obtener ms informacin, visite: www.cdc.gov/vaccinesafety/. 5. Qu pasa si hay una reaccin grave? A qu signos debo estar atento?  Observe todo lo que le preocupe, como signos de una reaccin alrgica grave, fiebre muy alta o comportamiento fuera de lo normal. Los signos de una reaccin alrgica grave pueden incluir ronchas, hinchazn de la cara y la garganta, dificultad para respirar, latidos cardacos acelerados, mareos y debilidad. Generalmente, estos comenzaran entre unos pocos minutos y algunas horas despus de la vacunacin. Qu debo hacer?  Si usted piensa que se trata de una reaccin alrgica grave o de otra emergencia que no puede esperar, llame al 911 o dirjase al hospital ms cercano. Sino, llame a su mdico.  Despus, la reaccin debe informarse al Sistema de Informacin sobre Efectos Adversos de las Vacunas (Vaccine Adverse Event Reporting System, VAERS). Su mdico puede presentar este informe, o puede hacerlo  usted mismo a travs del sitio web de VAERS, en www.vaers.hhs.gov, o llamando al 1-800-822-7967. VAERS no brinda recomendaciones mdicas. 6. Programa Nacional de Compensacin de Daos por Vacunas El Programa Nacional de Compensacin de Daos por Vacunas (National Vaccine Injury Compensation Program, VICP) es un programa federal que fue creado para compensar a las personas que puedan haber sufrido daos al recibir ciertas vacunas. Aquellas personas que consideren que han sufrido un dao como consecuencia de una vacuna y quieran saber ms acerca del programa y de cmo presentar una denuncia, pueden llamar al 1-800-338-2382 o visitar su sitio web en www.hrsa.gov/vaccinecompensation. Hay un lmite de tiempo para presentar un reclamo de compensacin. 7. Cmo puedo obtener ms informacin?  Consulte a su mdico. Este puede darle el prospecto de la vacuna o recomendarle otras fuentes de informacin.  Comunquese con el servicio de salud de su localidad o su estado.  Comunquese con los Centros para el Control y la Prevencin de Enfermedades (Centers for Disease Control and Prevention , CDC). ? Llame al 1-800-232-4636 (1-800-CDC-INFO). ? Visite el sitio web de los CDC en www.cdc.gov/vaccines. Declaracin de informacin sobre la vacuna contra la difteria y el ttanos (Td) de los CDC (04/02/16) Esta informacin no tiene como fin reemplazar el consejo del mdico. Asegrese de hacerle al mdico cualquier pregunta que tenga. Document Released: 03/27/2009 Document Revised: 12/30/2014 Document Reviewed: 04/02/2016 Elsevier Interactive Patient Education  2017 Elsevier   Inc.  

## 2018-08-10 ENCOUNTER — Encounter (HOSPITAL_COMMUNITY): Payer: Self-pay

## 2018-08-26 ENCOUNTER — Other Ambulatory Visit (INDEPENDENT_AMBULATORY_CARE_PROVIDER_SITE_OTHER): Payer: Self-pay | Admitting: Physician Assistant

## 2018-08-26 ENCOUNTER — Ambulatory Visit
Admission: RE | Admit: 2018-08-26 | Discharge: 2018-08-26 | Disposition: A | Discharge status: Home / Self Care | Payer: Self-pay | Source: Ambulatory Visit | Attending: Physician Assistant | Admitting: Physician Assistant

## 2018-08-26 DIAGNOSIS — M8589 Other specified disorders of bone density and structure, multiple sites: Secondary | ICD-10-CM

## 2018-08-26 DIAGNOSIS — E2839 Other primary ovarian failure: Secondary | ICD-10-CM

## 2018-08-26 DIAGNOSIS — Z8739 Personal history of other diseases of the musculoskeletal system and connective tissue: Secondary | ICD-10-CM

## 2018-08-26 MED ORDER — ALENDRONATE SODIUM 35 MG PO TABS: 35.0000 mg | ORAL_TABLET | ORAL | 3 refills | Status: DC

## 2018-08-26 MED FILL — LOVASTATIN 20 MG TABLET: 20 | 30 days supply | Qty: 60 | Fill #2

## 2018-08-26 MED FILL — LISINOPRIL-HCTZ 20-12.5 MG: 20-12.5 | 30 days supply | Qty: 60 | Fill #2

## 2018-08-27 ENCOUNTER — Telehealth (INDEPENDENT_AMBULATORY_CARE_PROVIDER_SITE_OTHER): Payer: Self-pay

## 2018-08-27 MED FILL — ALENDRONATE NA 35 MG TAB: 35 | 90 days supply | Qty: 12 | Fill #0

## 2018-08-27 NOTE — Telephone Encounter (Signed)
-----   Message from Loletta Specter, PA-C sent at 08/26/2018  1:40 PM EDT ----- Bone density shows osteopenia. I have sent alendronate 35 mg to be taken once a week and to help strengthen her bones.

## 2018-08-27 NOTE — Telephone Encounter (Signed)
Call placed using pacific interpreter [phone]) patient is aware that bone density scan shows she has osteopenia. Alendronate 35 mg to be taken once a week to help strengthen her bones has been sent to [facility] pharmacy. Patient expressed understanding. [name] [name], CMA

## 2018-09-04 ENCOUNTER — Ambulatory Visit (INDEPENDENT_AMBULATORY_CARE_PROVIDER_SITE_OTHER): Payer: PRIVATE HEALTH INSURANCE

## 2018-09-04 ENCOUNTER — Ambulatory Visit: Payer: Self-pay | Attending: Physician Assistant

## 2018-09-08 ENCOUNTER — Ambulatory Visit (INDEPENDENT_AMBULATORY_CARE_PROVIDER_SITE_OTHER): Payer: No Typology Code available for payment source

## 2018-09-08 DIAGNOSIS — Z23 Encounter for immunization: Secondary | ICD-10-CM

## 2018-09-10 ENCOUNTER — Other Ambulatory Visit: Payer: PRIVATE HEALTH INSURANCE

## 2018-09-16 MED FILL — ?ALENDRONATE NA 35 MG TAB: 35 | 29 days supply | Qty: 4 | Fill #1

## 2018-09-30 MED FILL — LISINOPRIL-HCTZ 20-12.5 MG: 20-12.5 | 30 days supply | Qty: 60 | Fill #3

## 2018-09-30 MED FILL — LOVASTATIN 20 MG TABLET: 20 | 30 days supply | Qty: 60 | Fill #3

## 2018-10-13 MED FILL — ?ALENDRONATE NA 35 MG TAB: 35 | 29 days supply | Qty: 4 | Fill #2

## 2018-10-30 MED FILL — LISINOPRIL-HCTZ 20-12.5 MG: 20-12.5 | 30 days supply | Qty: 60 | Fill #4

## 2018-10-30 MED FILL — LOVASTATIN 20 MG TABLET: 20 | 30 days supply | Qty: 60 | Fill #4

## 2018-11-12 MED FILL — ?ALENDRONATE NA 35 MG TAB: 35 | 29 days supply | Qty: 4 | Fill #3

## 2018-12-03 MED FILL — LOVASTATIN 20 MG TABLET: 20 | 30 days supply | Qty: 60 | Fill #5

## 2018-12-03 MED FILL — LISINOPRIL-HCTZ 20-12.5 MG: 20-12.5 | 30 days supply | Qty: 60 | Fill #5

## 2018-12-09 MED FILL — ?ALENDRONATE NA 35 MG TAB: 35 | 29 days supply | Qty: 4 | Fill #4

## 2018-12-21 ENCOUNTER — Ambulatory Visit: Payer: Self-pay | Attending: Family Medicine

## 2019-01-04 MED FILL — LOVASTATIN 20 MG TABLET: 20 | 30 days supply | Qty: 60 | Fill #6

## 2019-01-04 MED FILL — ?ALENDRONATE NA 35 MG TAB: 35 | 29 days supply | Qty: 4 | Fill #5

## 2019-01-04 MED FILL — LISINOPRIL-HCTZ 20-12.5 MG: 20-12.5 | 30 days supply | Qty: 60 | Fill #6

## 2019-02-01 MED FILL — LISINOPRIL-HCTZ 20-12.5 MG: 20-12.5 | 30 days supply | Qty: 60 | Fill #7

## 2019-02-01 MED FILL — ALENDRONATE NA 35 MG TAB: 35 | 29 days supply | Qty: 4 | Fill #6

## 2019-02-01 MED FILL — LOVASTATIN 20 MG TABLET: 20 | 30 days supply | Qty: 60 | Fill #7

## 2019-02-05 ENCOUNTER — Ambulatory Visit (INDEPENDENT_AMBULATORY_CARE_PROVIDER_SITE_OTHER): Payer: Self-pay | Admitting: Primary Care

## 2019-02-05 ENCOUNTER — Other Ambulatory Visit: Payer: Self-pay

## 2019-02-05 ENCOUNTER — Encounter (INDEPENDENT_AMBULATORY_CARE_PROVIDER_SITE_OTHER): Payer: Self-pay | Admitting: Primary Care

## 2019-02-05 VITALS — BP 125/69 | HR 61 | Temp 97.8°F | Ht <= 58 in | Wt 187.6 lb

## 2019-02-05 DIAGNOSIS — E785 Hyperlipidemia, unspecified: Secondary | ICD-10-CM | POA: Insufficient documentation

## 2019-02-05 DIAGNOSIS — M8589 Other specified disorders of bone density and structure, multiple sites: Secondary | ICD-10-CM | POA: Insufficient documentation

## 2019-02-05 DIAGNOSIS — I10 Essential (primary) hypertension: Secondary | ICD-10-CM

## 2019-02-05 DIAGNOSIS — Z760 Encounter for issue of repeat prescription: Secondary | ICD-10-CM

## 2019-02-05 DIAGNOSIS — E2839 Other primary ovarian failure: Secondary | ICD-10-CM

## 2019-02-05 NOTE — Progress Notes (Signed)
Pt complains of back pain and pain in her left foot
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
## 2019-03-03 MED FILL — ALENDRONATE NA 35 MG TAB: 35 | 29 days supply | Qty: 4 | Fill #7

## 2019-03-03 MED FILL — LOVASTATIN 20 MG TABLET: 20 | 30 days supply | Qty: 60 | Fill #8

## 2019-03-03 MED FILL — LISINOPRIL-HCTZ 20-12.5 MG: 20-12.5 | 30 days supply | Qty: 60 | Fill #8

## 2019-03-05 ENCOUNTER — Ambulatory Visit (INDEPENDENT_AMBULATORY_CARE_PROVIDER_SITE_OTHER): Payer: Self-pay | Admitting: Primary Care

## 2019-03-05 ENCOUNTER — Other Ambulatory Visit: Payer: Self-pay

## 2019-03-16 MED FILL — LOVASTATIN 20 MG TABLET: 20 | 30 days supply | Qty: 60 | Fill #9

## 2019-03-16 MED FILL — LISINOPRIL-HCTZ 20-12.5 MG: 20-12.5 | 30 days supply | Qty: 60 | Fill #9

## 2019-03-16 MED FILL — ALENDRONATE NA 35 MG TAB: 35 | 29 days supply | Qty: 4 | Fill #8

## 2019-03-25 ENCOUNTER — Ambulatory Visit: Payer: Self-pay | Attending: Primary Care | Admitting: Physician Assistant

## 2019-03-25 ENCOUNTER — Other Ambulatory Visit: Payer: Self-pay

## 2019-03-25 DIAGNOSIS — R05 Cough: Secondary | ICD-10-CM

## 2019-03-25 DIAGNOSIS — J40 Bronchitis, not specified as acute or chronic: Secondary | ICD-10-CM

## 2019-03-25 DIAGNOSIS — R0981 Nasal congestion: Secondary | ICD-10-CM

## 2019-03-25 DIAGNOSIS — Z789 Other specified health status: Secondary | ICD-10-CM

## 2019-03-25 MED ORDER — AZITHROMYCIN 250 MG PO TABS: ORAL_TABLET | ORAL | 0 refills | Status: DC

## 2019-03-25 MED ORDER — BENZONATATE 100 MG PO CAPS: 200.0000 mg | ORAL_CAPSULE | Freq: Three times a day (TID) | ORAL | 0 refills | Status: DC | PRN

## 2019-03-25 MED FILL — AZITHROMYCIN 250 MG TABLET: 250 | 5 days supply | Qty: 6 | Fill #0

## 2019-03-25 MED FILL — BENZONATATE 100 MG CAPS: 100 | 13 days supply | Qty: 40 | Fill #0

## 2019-03-25 NOTE — Progress Notes (Signed)
Pt. Stated she had a cough more than 3-4 weeks.  No Phlegm, but she feel like it is there.

## 2019-03-25 NOTE — Progress Notes (Signed)
Patient ID: [name] [name], female   DOB: 1951-09-20, 68 y.o.   MRN: [id number]    Virtual Visit via Telephone Note  I connected with [name] [name] on 03/25/19 at  2:30 PM EDT by telephone and verified that I am speaking with the correct person using two identifiers.   I discussed the limitations, risks, security and privacy concerns of performing an evaluation and management service by telephone and the availability of in person appointments. I also discussed with the patient that there may be a patient responsible charge related to this service. The patient expressed understanding and agreed to proceed.   History of Present Illness: I am in my office at [facility].  Covid-19 precautions being taken.   Location of patient:  home Patient Consented to phone visit:  yes Participating persons: patient and interpreter, Amil Amen Time on call:  11 mins  Cough for several weeks.  Mostly non-productive; sometimes yellowish phlegm. No fever.  No runny nose.  +sinus congestion.  No recent travel.  No exposure to Covid-19 or sick contacts.  No GI s/sx.    Amil Amen is the phone interpreter.    Observations/Objective:  TP linear.  NAD.     Assessment and Plan: 1. Bronchitis Will cover for atypicals - azithromycin (ZITHROMAX) 250 MG tablet; Take 2 today then one daily  Dispense: 6 tablet; Refill: 0 - benzonatate (TESSALON) 100 MG capsule; Take 2 capsules (200 mg total) by mouth 3 (three) times daily as needed.  Dispense: 40 capsule; Refill: 0   Follow Up Instructions: 3 months with PCP;  Sooner if needed   I discussed the assessment and treatment plan with the patient. The patient was provided an opportunity to ask questions and all were answered. The patient agreed with the plan and demonstrated an understanding of the instructions.   The patient was advised to call back or seek an in-person evaluation if the symptoms worsen or if the condition fails to improve as anticipated.  I provided  11  minutes of non-face-to-face time during this encounter.   Georgian Co, PA-C

## 2019-04-29 MED FILL — LOVASTATIN 20 MG TABS: 20 | 30 days supply | Qty: 60 | Fill #10

## 2019-04-29 MED FILL — LISINOPRIL-HCTZ 20-12.5 MG: 20-12.5 | 30 days supply | Qty: 60 | Fill #10

## 2019-04-29 MED FILL — ALENDRONATE NA 35 MG TAB: 35 | 29 days supply | Qty: 4 | Fill #9

## 2019-06-02 MED FILL — LISINOPRIL-HCTZ 20-12.5 MG: 20-12.5 | 30 days supply | Qty: 60 | Fill #11

## 2019-06-02 MED FILL — LOVASTATIN 20 MG TABS: 20 | 30 days supply | Qty: 60 | Fill #11

## 2019-06-16 ENCOUNTER — Telehealth (INDEPENDENT_AMBULATORY_CARE_PROVIDER_SITE_OTHER): Payer: Self-pay | Admitting: Primary Care

## 2019-06-16 MED ORDER — ALENDRONATE SODIUM 35 MG PO TABS: 35.0000 mg | ORAL_TABLET | ORAL | 3 refills | Status: DC

## 2019-06-16 MED FILL — ALENDRONATE NA 35 MG TAB: 35 | 28 days supply | Qty: 4 | Fill #0

## 2019-06-16 NOTE — Telephone Encounter (Signed)
Refill sent to pharmacy. Nat Christen, CMA

## 2019-06-16 NOTE — Telephone Encounter (Signed)
Patient called to request refill for alendronate (FOSAMAX) 35 MG tablet    Patient uses [facility]  Please advise [phone]  Thank you [name] [name]

## 2019-07-08 ENCOUNTER — Telehealth (INDEPENDENT_AMBULATORY_CARE_PROVIDER_SITE_OTHER): Payer: Self-pay

## 2019-07-08 DIAGNOSIS — I10 Essential (primary) hypertension: Secondary | ICD-10-CM

## 2019-07-08 DIAGNOSIS — E785 Hyperlipidemia, unspecified: Secondary | ICD-10-CM

## 2019-07-08 MED FILL — ALENDRONATE NA 35 MG TAB: 35 | 28 days supply | Qty: 4 | Fill #0

## 2019-07-08 NOTE — Telephone Encounter (Signed)
Patient called to request med refill for lisinopril-hydrochlorothiazide  lovastatin (MEVACOR) 20 MG tablet   Patient uses [facility]   Please advice [phone]  Thank you Whitney Post

## 2019-07-09 MED ORDER — LISINOPRIL-HYDROCHLOROTHIAZIDE 20-12.5 MG PO TABS: 2.0000 | ORAL_TABLET | Freq: Every day | ORAL | 0 refills | Status: DC

## 2019-07-09 MED ORDER — LOVASTATIN 20 MG PO TABS: 40.0000 mg | ORAL_TABLET | Freq: Every day | ORAL | 0 refills | Status: DC

## 2019-07-09 NOTE — Telephone Encounter (Signed)
Patient is aware that 30 day supply given until she is seen in clinic. She needs to have lipids checked and HTN monitored. Patient expressed understanding and is scheduled for 07/15/2019. Nat Christen, CMA

## 2019-07-12 MED FILL — LOVASTATIN 20 MG TABS: 20 | 30 days supply | Qty: 60 | Fill #0

## 2019-07-12 MED FILL — LISINOPRIL-HCTZ 20-12.5 MG: 20-12.5 | 30 days supply | Qty: 60 | Fill #0

## 2019-07-15 ENCOUNTER — Other Ambulatory Visit: Payer: Self-pay

## 2019-07-15 ENCOUNTER — Ambulatory Visit (INDEPENDENT_AMBULATORY_CARE_PROVIDER_SITE_OTHER): Payer: Self-pay | Admitting: Primary Care

## 2019-07-15 ENCOUNTER — Encounter (INDEPENDENT_AMBULATORY_CARE_PROVIDER_SITE_OTHER): Payer: Self-pay | Admitting: Primary Care

## 2019-07-15 DIAGNOSIS — Z6841 Body Mass Index (BMI) 40.0 and over, adult: Secondary | ICD-10-CM

## 2019-07-15 DIAGNOSIS — E785 Hyperlipidemia, unspecified: Secondary | ICD-10-CM

## 2019-07-15 DIAGNOSIS — I10 Essential (primary) hypertension: Secondary | ICD-10-CM

## 2019-07-15 DIAGNOSIS — Z760 Encounter for issue of repeat prescription: Secondary | ICD-10-CM

## 2019-07-15 MED ORDER — LISINOPRIL-HYDROCHLOROTHIAZIDE 20-12.5 MG PO TABS: 2.0000 | ORAL_TABLET | Freq: Every day | ORAL | 3 refills | Status: DC

## 2019-07-15 MED ORDER — LOVASTATIN 20 MG PO TABS: 40.0000 mg | ORAL_TABLET | Freq: Every day | ORAL | 3 refills | Status: DC

## 2019-07-15 NOTE — Patient Instructions (Signed)
Obesidad en los adultos Obesity, Adult La obesidad es un exceso de grasa corporal. Ser obeso significa que su peso es ms de lo que es saludable para usted. El IMC es un nmero que indica la cantidad de grasa corporal que tiene una persona. Si usted tiene un ndice de masa corporal (IMC) de 30o ms, esto significa que es obeso. A menudo, la causa de la obesidad es comer o beber ms caloras que las que el cuerpo usa. Cambiar el estilo de vida puede ayudarlo a bajar de peso. La obesidad puede causar problemas de salud graves, como los siguientes:  Accidente cerebrovascular.  Arteriopata coronaria (EAC).  Diabetes tipo 2.  Algunos tipos de cncer, incluido el cncer de colon, mama, tero y vescula.  Artrosis.  Presin arterial alta (hipertensin arterial).  Colesterol alto.  Apnea del sueo.  Clculos en la vescula.  Problemas de esterilidad. Cules son las causas?  Consumir todos los das alimentos con altos niveles de caloras, azcar y grasa.  Nacer con genes que pueden hacerlo ms propenso a ser obeso.  Tener una afeccin mdica que causa obesidad.  Tomar ciertos medicamentos.  Permanecer mucho tiempo sentado (tener un estilo de vida sedentario).  No dormir lo suficiente.  Beber gran cantidad de bebidas con azcar. Qu incrementa el riesgo?  Tener antecedentes familiares de obesidad.  Ser mujer afroamericana.  Ser hombre de origen hispano.  Vivir en un rea con acceso limitado a las siguientes posibilidades: ? Parques, centros recreativos o veredas. ? Alimentos saludables, como se venden en tiendas de comestibles y mercados de agricultores. Cules son los signos o los sntomas? El principal signo es tener demasiada grasa corporal. Cmo se trata?  El tratamiento de esta afeccin frecuentemente incluye cambiar el estilo de vida. El tratamiento puede incluir: ? Cambios en la dieta. Esto puede incluir crear un plan de alimentacin saludable. ? Realizar  actividad fsica. Puede incluir una actividad que hace que el corazn lata ms rpido (ejercicio aerbico) y entrenamiento de fuerza. Trabaje con su mdico para disear un programa que funcione para usted. ? Medicamentos para ayudarlo a perder peso. Pueden utilizarse si no puede perder 1 libra (450g) por semana despus de 6 semanas de alimentacin saludable y ms ejercicio. ? Tratar las afecciones que causan la obesidad. ? Ciruga. Las opciones pueden incluir bandas gstricas y bypass gstrico. Esto puede realizarse en las siguientes situaciones:  Otros tratamientos no mejoraron su afeccin.  Tiene un IMC de 40 o superior.  Tiene problemas de salud potencialmente mortales relacionados con la obesidad. Siga estas instrucciones en su casa: Comida y bebida   Siga las instrucciones del mdico respecto de las comidas y las bebidas. Su mdico puede recomendarle lo siguiente: ? Limitar las comidas rpidas, los dulces y las colaciones procesadas. ? Elegir opciones con bajo contenido de grasas. Por ejemplo, leche descremada en lugar de leche entera. ? Consumir 5o ms porciones de frutas o verduras por da. ? Comer en casa con ms frecuencia. Esto le da ms control sobre lo que come. ? Cuando coma afuera, elija alimentos saludables. ? Aprenda a leer las etiquetas de los alimentos. Esto le ayudar a aprender qu cantidad de alimento hay en una porcin. ? Tenga a mano colaciones con bajo contenido de grasas. ? Evite las bebidas que contengan mucha azcar. Estas incluyen refrescos, jugo de frutas, t helado con azcar y leche saborizada.  Beba suficiente agua para mantener el pis (la orina) de color amarillo plido.  No siga las dietas de moda. Actividad   fsica  Haga ejercicios con frecuencia, como se lo haya indicado el mdico. La mayora de los adultos deben hacer hasta 150minutos de ejercicio de intensidad moderada cada semana.Pregntele al mdico lo siguiente: ? Los tipos de ejercicios que  son seguros para usted. ? La frecuencia con la que debe hacer los ejercicios.  Precaliente y elongue adecuadamente antes de hacer actividad fsica.  Haga un estiramiento lento despus de la actividad (relajacin).  Descanse entre los perodos de actividad. Estilo de vida  Trabaje con su mdico y con un experto en alimentacin (nutricionista) para establecer un objetivo de prdida de peso que sea adecuado para usted.  Limite el tiempo que pasa frente a una pantalla.  Busque formas de recompensarse que no incluyan alimentos.  No beba alcohol si: ? El mdico le indica que no lo haga. ? Est embarazada, puede estar embarazada o est tratando de quedar embarazada.  Si bebe alcohol: ? Limite la cantidad que bebe a lo siguiente:  De 0 a 1 medida por da para las mujeres.  De 0 a 2 medidas por da para los hombres. ? Est atento a la cantidad de alcohol que hay en las bebidas que toma. En los [place], una medida equivale a una botella de cerveza de 12oz (355ml), un vaso de vino de 5oz (148ml) o un vaso de una bebida alcohlica de alta graduacin de 1oz (44ml). Instrucciones generales  Lleve un diario de su prdida de peso. Esto puede ayudarlo a mantener un registro de lo siguiente: ? Los alimentos que come. ? Cunto ejercicio realiza.  Tome los medicamentos de venta libre y los recetados solamente como se lo haya indicado el mdico.  Tome vitaminas y suplementos solamente como se lo haya indicado el mdico.  Considere participar en un grupo de apoyo.  Concurra a todas las visitas de seguimiento como se lo haya indicado el mdico. Esto es importante. Comunquese con un mdico si:  No puede alcanzar su objetivo de prdida de peso despus de haber modificado su dieta y su estilo de vida durante 6semanas. Solicite ayuda inmediatamente si:  Tiene dificultad para respirar.  Tiene pensamientos acerca de lastimarse. Resumen  La obesidad es un exceso de grasa corporal.   Ser obeso significa que su peso es ms de lo que es saludable para usted.  Trabaje con su mdico para establecer un objetivo de prdida de peso.  Haga actividad fsica con regularidad tal como le indic el mdico. Esta informacin no tiene como fin reemplazar el consejo del mdico. Asegrese de hacerle al mdico cualquier pregunta que tenga. Document Released: 06/09/2012 Document Revised: 09/03/2018 Document Reviewed: 09/03/2018 Elsevier Patient Education  2020 Elsevier Inc.  

## 2019-07-15 NOTE — Progress Notes (Signed)
Established Patient Office Visit  Subjective:  Patient ID: [name] [name], female    DOB: Jun 09, 1951  Age: 68 y.o. MRN: [id number]  CC:  Chief Complaint  Patient presents with  . Hyperlipidemia  . Medication Refill    HPI [name] [name] [name] presents for the management of hypertension, hyperlipidemia and obesity. She denies shortness of breath, headaches, chest pain or lower extremity edema  Past Medical History:  Diagnosis Date  . Allergy    spring pollen  . Arthritis   . Astigmatism, 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  . Cataract 01/28/2017  . High cholesterol   . History of kidney stones   . 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 Hypertension   . Osteoarthritis of ankle, right    and Subtalar Joint  . Osteoporosis   . Presbyopia of both eyes 01/28/2017  . Pterygium eye, 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    Past Surgical History:  Procedure Laterality Date  . ANKLE FUSION Right 12/24/2017   Tibiocalcaneal fusion  . ANKLE FUSION Right 12/24/2017   Procedure: RIGHT TIBIOCALCANEAL FUSION;  Surgeon: Newt Minion, MD;  Location: [facility];  Service: Orthopedics;  Laterality: Right;  . CHOLECYSTECTOMY      History reviewed. No pertinent family history.  Outpatient Medications Prior to Visit  Medication Sig Dispense Refill  . alendronate (FOSAMAX) 35 MG tablet Take 1 tablet (35 mg total) by mouth every 7 (seven) days. Take with a full glass of water on an empty stomach. 12 tablet 3  . Calcium Carb-Cholecalciferol (CALTRATE 600+D) 600-800 MG-UNIT TABS Take 2 tablets by mouth daily. 60 tablet 11  . lisinopril-hydrochlorothiazide (ZESTORETIC) 20-12.5 MG tablet Take 2 tablets by mouth daily. 60 tablet 0  . lovastatin (MEVACOR) 20 MG tablet Take 2 tablets (40 mg total) by mouth at bedtime. 60 tablet 0  . azithromycin (ZITHROMAX) 250 MG tablet Take 2 today then one daily 6 tablet 0  . benzonatate (TESSALON) 100 MG capsule Take 2 capsules (200 mg total) by mouth 3 (three) times daily as needed. 40 capsule 0    No facility-administered medications prior to visit.     No Known Allergies  ROS Review of Systems  Respiratory: Positive for cough.   Cardiovascular: Positive for leg swelling.       Right hx of ankle surgery   Musculoskeletal: Positive for back pain and gait problem.       Fallen in the last 6 months      Objective:    Physical Exam  Constitutional: She is oriented to person, place, and time. She appears well-developed and well-nourished.  HENT:  Head: Normocephalic.  Neck: Neck supple.  Cardiovascular: Normal rate and regular rhythm.  Pulmonary/Chest: Effort normal and breath sounds normal.  Abdominal: Soft. Bowel sounds are normal. She exhibits distension.  Musculoskeletal:     Comments: Lower extremity stiffness especially on right foot hx of fracture  Neurological: She is oriented to person, place, and time.  Skin: Skin is warm and dry.    BP 126/80 (BP Location: Right Arm, Patient Position: Sitting, Cuff Size: Large)   Pulse (!) 54   Temp 97.6 F (36.4 C) (Tympanic)   Ht 4' 9" (1.448 m)   Wt 190 lb 12.8 oz (86.5 kg)   SpO2 95%   BMI 41.29 kg/m  Wt Readings from Last 3 Encounters:  07/15/19 190 lb 12.8 oz (86.5 kg)  02/05/19 187 lb 9.6 oz (85.1 kg)  08/05/18 187 lb 9.6 oz (85.1 kg)     There are no  preventive care reminders to display for this patient.  There are no preventive care reminders to display for this patient.  Lab Results  Component Value Date   TSH 1.060 07/18/2017   Lab Results  Component Value Date   WBC 5.8 01/28/2018   HGB 12.6 01/28/2018   HCT 36.5 01/28/2018   MCV 92 01/28/2018   PLT 302 01/28/2018   Lab Results  Component Value Date   NA 139 07/27/2018   K 4.1 07/27/2018   CO2 23 07/27/2018   GLUCOSE 94 07/27/2018   BUN 20 07/27/2018   CREATININE 0.55 (L) 07/27/2018   BILITOT 0.4 07/27/2018   ALKPHOS 84 07/27/2018   AST 16 07/27/2018   ALT 14 07/27/2018   PROT 7.3 07/27/2018   ALBUMIN 4.3 07/27/2018   CALCIUM  9.8 07/27/2018   ANIONGAP 7 12/17/2017   Lab Results  Component Value Date   CHOL 241 (H) 07/27/2018   Lab Results  Component Value Date   HDL 70 07/27/2018   Lab Results  Component Value Date   LDLCALC 145 (H) 07/27/2018   Lab Results  Component Value Date   TRIG 128 07/27/2018   Lab Results  Component Value Date   CHOLHDL 3.4 07/27/2018   Lab Results  Component Value Date   HGBA1C 5.4 01/28/2018      Assessment & Plan:  [name] [name] was seen today for hyperlipidemia and medication refill.  Diagnoses and all orders for this visit:  Morbid obesity ([place]) Morbid obesity is 30-39 indicating an excess in caloric intake or underlining conditions. This may lead to other co-morbidities. Lifestyle modifications of diet and exercise may reduce obesity.  -     CBC with Differential  Hyperlipidemia, unspecified hyperlipidemia type.  Healthy lifestyle diet of fruits vegetables fish nuts whole grains and low saturated fat . Foods high in cholesterol or liver, fatty meats,cheese, butter avocados, nuts and seeds, chocolate and fried foods. -     CMP14+EGFR -     Lipid Panel  Hypertension, unspecified type Counseled on blood pressure goal of less than 130/80, low-sodium, DASH diet, medication compliance, 150 minutes of moderate intensity exercise per week. Discussed medication compliance, adverse effects. -     CBC with Differential  Medication refill -lisinopril-hydrochlorothiazide (ZESTORETIC) 20-12.5 MG tablet; Take 2 tablets by mouth daily. - lovastatin (MEVACOR) 20 MG tablet; Take 2 tablets (40 mg total) by mouth at bedtime  Meds ordered this encounter  Medications  . lovastatin (MEVACOR) 20 MG tablet    Sig: Take 2 tablets (40 mg total) by mouth at bedtime.    Dispense:  60 tablet    Refill:  3  . lisinopril-hydrochlorothiazide (ZESTORETIC) 20-12.5 MG tablet    Sig: Take 2 tablets by mouth daily.    Dispense:  60 tablet    Refill:  3   [name] [name] was seen today for  hyperlipidemia and medication refill.  Diagnoses and all orders for this visit:  Morbid obesity ([place]) Morbid obesity BMI 41 is  indicating an excess in caloric intake or underlining conditions. This may lead to other co-morbidities. Lifestyle modifications of diet and exercise may reduce obesity.  -     CBC with Differential  Hyperlipidemia, unspecified hyperlipidemia type  Healthy lifestyle diet of fruits vegetables fish nuts whole grains and low saturated fat . Foods high in cholesterol or liver, fatty meats,cheese, butter avocados, nuts and seeds, chocolate and fried foods. -     CMP14+EGFR -     Lipid Panel  Hypertension, unspecified type Counseled on blood pressure goal of less than 130/80, low-sodium, DASH diet, medication compliance, 150 minutes of moderate intensity exercise per week. Discussed medication compliance, adverse effects. --     CBC with Differential  Medication refill lisinopril-hydrochlorothiazide (ZESTORETIC) 20-12.5 MG tablet; Take 2 tablets by mouth daily.       lovastatin (MEVACOR) 20 MG tablet; Take 2 tablets (40 mg           total) by mouth at bedtime.  Follow-up: Return in about 6 months (around 01/15/2020) for Hypertension and hyperlipidemia.    Kerin Perna, NP

## 2019-07-15 NOTE — Progress Notes (Signed)
Pain on the right side of her back.

## 2019-07-16 ENCOUNTER — Telehealth (INDEPENDENT_AMBULATORY_CARE_PROVIDER_SITE_OTHER): Payer: Self-pay

## 2019-07-16 LAB — CMP14+EGFR
ALT: 14 IU/L (ref 0–32)
AST: 22 IU/L (ref 0–40)
Albumin/Globulin Ratio: 1.4 (ref 1.2–2.2)
Albumin: 4.2 g/dL (ref 3.8–4.8)
Alkaline Phosphatase: 70 IU/L (ref 39–117)
BUN/Creatinine Ratio: 22 (ref 12–28)
BUN: 13 mg/dL (ref 8–27)
Bilirubin Total: 0.4 mg/dL (ref 0.0–1.2)
CO2: 21 mmol/L (ref 20–29)
Calcium: 9.8 mg/dL (ref 8.7–10.3)
Chloride: 104 mmol/L (ref 96–106)
Creatinine, Ser: 0.59 mg/dL (ref 0.57–1.00)
GFR calc Af Amer: 107 mL/min/{1.73_m2} (ref >59)
GFR calc non Af Amer: 93 mL/min/{1.73_m2} (ref >59)
Globulin, Total: 3 g/dL (ref 1.5–4.5)
Glucose: 87 mg/dL (ref 65–99)
Potassium: 4.2 mmol/L (ref 3.5–5.2)
Sodium: 143 mmol/L (ref 134–144)
Total Protein: 7.2 g/dL (ref 6.0–8.5)

## 2019-07-16 LAB — LIPID PANEL
Chol/HDL Ratio: 2.9 ratio (ref 0.0–4.4)
Cholesterol, Total: 191 mg/dL (ref 100–199)
HDL: 67 mg/dL (ref >39)
LDL Calculated: 99 mg/dL (ref 0–99)
Triglycerides: 124 mg/dL (ref 0–149)
VLDL Cholesterol Cal: 25 mg/dL (ref 5–40)

## 2019-07-16 LAB — CBC WITH DIFFERENTIAL/PLATELET
Basophils Absolute: 0.1 10*3/uL (ref 0.0–0.2)
Basos: 1 %
EOS (ABSOLUTE): 0.4 10*3/uL (ref 0.0–0.4)
Eos: 7 %
Hematocrit: 36.2 % (ref 34.0–46.6)
Hemoglobin: 12.8 g/dL (ref 11.1–15.9)
Immature Grans (Abs): 0 10*3/uL (ref 0.0–0.1)
Immature Granulocytes: 0 %
Lymphocytes Absolute: 1.3 10*3/uL (ref 0.7–3.1)
Lymphs: 23 %
MCH: 32.9 pg (ref 26.6–33.0)
MCHC: 35.4 g/dL (ref 31.5–35.7)
MCV: 93 fL (ref 79–97)
Monocytes Absolute: 0.4 10*3/uL (ref 0.1–0.9)
Monocytes: 7 %
Neutrophils Absolute: 3.5 10*3/uL (ref 1.4–7.0)
Neutrophils: 62 %
Platelets: 314 10*3/uL (ref 150–450)
RBC: 3.89 x10E6/uL (ref 3.77–5.28)
RDW: 12.2 % (ref 11.7–15.4)
WBC: 5.6 10*3/uL (ref 3.4–10.8)

## 2019-07-16 NOTE — Telephone Encounter (Signed)
Call placed to patient using pacific interpreter [phone]) patient is aware that labs are normal. Cholesterol medication reduced to 20 mg, one tablet at bedtime. Patient expressed understanding and did not have any questions. Nat Christen, CMA

## 2019-07-16 NOTE — Telephone Encounter (Signed)
-----   Message from Kerin Perna, NP sent at 07/16/2019 12:43 PM EDT ----- All labs are normal

## 2019-08-02 MED FILL — ALENDRONATE NA 35 MG TAB: 35 | 28 days supply | Qty: 4 | Fill #1

## 2019-08-23 ENCOUNTER — Other Ambulatory Visit: Payer: Self-pay

## 2019-08-23 ENCOUNTER — Ambulatory Visit (HOSPITAL_COMMUNITY)
Admission: EM | Admit: 2019-08-23 | Discharge: 2019-08-23 | Disposition: A | Discharge status: Home / Self Care | Payer: Self-pay | Attending: Family Medicine | Admitting: Family Medicine

## 2019-08-23 ENCOUNTER — Ambulatory Visit: Payer: Self-pay | Attending: Family Medicine

## 2019-08-23 ENCOUNTER — Ambulatory Visit (INDEPENDENT_AMBULATORY_CARE_PROVIDER_SITE_OTHER): Payer: Self-pay

## 2019-08-23 ENCOUNTER — Encounter (HOSPITAL_COMMUNITY): Payer: Self-pay | Admitting: Emergency Medicine

## 2019-08-23 DIAGNOSIS — S92411A Displaced fracture of proximal phalanx of right great toe, initial encounter for closed fracture: Secondary | ICD-10-CM

## 2019-08-23 DIAGNOSIS — W19XXXA Unspecified fall, initial encounter: Secondary | ICD-10-CM

## 2019-08-23 MED ORDER — HYDROCODONE-ACETAMINOPHEN 5-325 MG PO TABS: 1.0000 | ORAL_TABLET | Freq: Four times a day (QID) | ORAL | 0 refills | Status: DC | PRN

## 2019-08-23 MED ORDER — HYDROCODONE-ACETAMINOPHEN 5-325 MG PO TABS
1.0000 | ORAL_TABLET | Freq: Once | ORAL | Status: AC
  Administered 2019-08-23: 16:00:00 1 via ORAL

## 2019-08-23 MED ORDER — HYDROCODONE-ACETAMINOPHEN 5-325 MG PO TABS
ORAL_TABLET | ORAL | Status: AC
  Filled 2019-08-23: qty 1

## 2019-08-23 NOTE — ED Triage Notes (Signed)
Pt here with right foot pain from fall

## 2019-08-23 NOTE — Discharge Instructions (Signed)
Elevate foot Use ice for 20 min every couple of hours Take the pain medicine as needed Walk as little as possible, try not to put weight on the foot See orthopedic surgeon in follow up Call tomorrow to set up an appointment

## 2019-08-23 NOTE — ED Provider Notes (Signed)
[facility]    CSN: [id number] Arrival date & time: 08/23/19  1421      History   Chief Complaint Chief Complaint  Patient presents with  . Foot Pain    HPI [name] [name] is a 68 y.o. female.   HPI  Patient fell on Saturday going through a doorway with a small elevation and landed on the right foot.  Swollen, discolored and painful ever since. Here for evaluation History of an old ankle fusion on this side many years ago Taking tylenol for pain Known osteoporosis/osteopenia  Past Medical History:  Diagnosis Date  . Allergy    spring pollen  . Arthritis   . Astigmatism, 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  . Cataract 01/28/2017  . High cholesterol   . History of kidney stones   . 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 Hypertension   . Osteoarthritis of ankle, right    and Subtalar Joint  . Osteoporosis   . Presbyopia of both eyes 01/28/2017  . Pterygium eye, 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    Patient Active Problem List   Diagnosis Date Noted  . Osteopenia of multiple sites 02/05/2019  . Hyperlipidemia 02/05/2019  . 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/2017  . Post-traumatic osteoarthritis, right ankle and foot   . Seasonal allergic rhinitis due to pollen 05/12/2017  . Allergy   . Cataract 01/28/2017  . Presbyopia of both eyes 01/28/2017  . Pterygium eye, 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  . Astigmatism, 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  . 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 Acquired bilateral flat feet 09/07/2015  . Osteoporosis 09/07/2015  . Transaminasemia 01/26/2015  . Generalized anxiety disorder 01/26/2015  . Hypertension 07/28/2014  . Gastritis 07/28/2014  . 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  . OBESITY 05/10/2009  . NEPHROLITHIASIS 06/15/2008  . Dyslipidemia 03/16/2008  . 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 BUNDLE BRANCH BLOCK, LEFT 12/11/2007  . PTSD 10/01/2007  . ARM PAIN, LEFT 10/01/2007    Past Surgical History:  Procedure Laterality Date  . ANKLE FUSION Right 12/24/2017   Tibiocalcaneal fusion  . ANKLE FUSION Right 12/24/2017   Procedure: RIGHT TIBIOCALCANEAL FUSION;  Surgeon: Nadara Mustarduda, Marcus V, MD;  [facility];  Service: Orthopedics;  Laterality: Right;  . CHOLECYSTECTOMY      OB History    Gravida  13   Para      Term      Preterm      AB  2   Living  10     SAB  2   TAB      Ectopic      Multiple      Live Births  10            Home Medications    Prior to Admission medications   Medication Sig Start Date End Date Taking? Authorizing Provider  alendronate (FOSAMAX) 35 MG tablet Take 1 tablet (35 mg total) by mouth every 7 (seven) days. Take with a full glass of water on an empty stomach. 06/16/19   Grayce SessionsEdwards, Michelle P, NP  Calcium Carb-Cholecalciferol (CALTRATE 600+D) 600-800 MG-UNIT TABS Take 2 tablets by mouth daily. 06/22/18   Loletta SpecterGomez, Roger David, PA-C  HYDROcodone-acetaminophen (NORCO/VICODIN) 5-325 MG tablet Take 1-2 tablets by mouth every 6 (six) hours as needed for severe pain. 08/23/19   Eustace MooreNelson, Shaconda Hajduk Sue, MD  lisinopril-hydrochlorothiazide (ZESTORETIC) 20-12.5 MG tablet Take 2 tablets by mouth daily. 07/15/19   Grayce SessionsEdwards, Michelle P, NP  lovastatin (MEVACOR) 20 MG tablet Take 2 tablets (40 mg total) by mouth at bedtime. 07/15/19  Kerin Perna, NP    Family History History reviewed. No pertinent family history.  Social History Social History   Tobacco Use  . Smoking status: Never Smoker  . Smokeless tobacco: Never Used  Substance Use Topics  . Alcohol use: No    Alcohol/week: 0.0 standard drinks  . Drug use: No     Allergies   Patient has no known allergies.   Review of Systems Review of Systems  Constitutional: Negative for chills and fever.  HENT: Negative for ear pain and sore throat.   Eyes: Negative for pain and visual disturbance.  Respiratory: Negative for cough and shortness of breath.   Cardiovascular: Negative for chest pain and palpitations.  Gastrointestinal: Negative for abdominal pain  and vomiting.  Genitourinary: Negative for dysuria and hematuria.  Musculoskeletal: Positive for arthralgias and gait problem. Negative for back pain.  Skin: Negative for color change and rash.  Neurological: Negative for seizures and syncope.  All other systems reviewed and are negative.    Physical Exam Triage Vital Signs ED Triage Vitals [08/23/19 1522]  Enc Vitals Group     BP 140/60     Pulse Rate 62     Resp 18     Temp 97.7 F (36.5 C)     Temp Source Oral     SpO2 99 %     Weight      Height      Head Circumference      Peak Flow      Pain Score      Pain Loc      Pain Edu?      Excl. in [place]?    No data found.  Updated Vital Signs BP 140/60 (BP Location: Right Arm)   Pulse 62   Temp 97.7 F (36.5 C) (Oral)   Resp 18   SpO2 99%      Physical Exam Constitutional:      General: She is not in acute distress.    Appearance: She is well-developed. She is obese.     Comments: In wheelchair  HENT:     Head: Normocephalic and atraumatic.  Eyes:     Conjunctiva/sclera: Conjunctivae normal.     Pupils: Pupils are equal, round, and reactive to light.  Neck:     Musculoskeletal: Normal range of motion.  Cardiovascular:     Rate and Rhythm: Normal rate.  Pulmonary:     Effort: Pulmonary effort is normal. No respiratory distress.  Abdominal:     General: There is no distension.     Palpations: Abdomen is soft.  Musculoskeletal: Normal range of motion.     Comments: right foot with moderate to severe swelling distally over the ist MTP area with fracture blisters visible and deep purple discoloration.  Tip of toe has good cap refill  and sensation.  Pain with any movement great toe  Skin:    General: Skin is warm and dry.  Neurological:     Mental Status: She is alert.      UC Treatments / Results  Labs (all labs ordered are listed, but only abnormal results are displayed) Labs Reviewed - No data to display  EKG   Radiology Dg Foot Complete Right   Result Date: 08/23/2019 CLINICAL DATA:  Right foot pain secondary to a fall. Swelling and bruising. Blister on the great toe. EXAM: RIGHT FOOT COMPLETE - 3+ VIEW COMPARISON:  Ankle radiographs dated 04/01/2018 FINDINGS: There is a comminuted impacted fracture of the base  of the proximal phalanx of the right great toe. Diffuse osteopenia. Previous ankle fusion. Hardware in place, unchanged. Slight arthritic changes between the talus and the navicular. IMPRESSION: 1. Comminuted impacted fracture of the base of the proximal phalanx of the right great toe. 2. Hardware intact. Electronically Signed   By: Francene Boyers M.D.   On: 08/23/2019 16:35    Procedures Procedures (including critical care time)  Medications Ordered in UC Medications  HYDROcodone-acetaminophen (NORCO/VICODIN) 5-325 MG per tablet 1 tablet (1 tablet Oral Given 08/23/19 1602)  HYDROcodone-acetaminophen (NORCO/VICODIN) 5-325 MG per tablet (has no administration in time range)    Initial Impression / Assessment and Plan / UC Course  I have reviewed the triage vital signs and the nursing notes.  Pertinent labs & imaging results that were available during my care of the patient were reviewed by me and considered in my medical decision making (see chart for details).     Called  Dr August Saucer who is on call for [facility].  He recommended a fracture shoe, limited weightbearing, follow-up with his office Final Clinical Impressions(s) / UC Diagnoses   Final diagnoses:  Closed displaced fracture of proximal phalanx of right great toe, initial encounter     Discharge Instructions     Elevate foot Use ice for 20 min every couple of hours Take the pain medicine as needed Walk as little as possible, try not to put weight on the foot See orthopedic surgeon in follow up Call tomorrow to set up an appointment    ED Prescriptions    Medication Sig Dispense Auth. Provider   HYDROcodone-acetaminophen (NORCO/VICODIN) 5-325  MG tablet Take 1-2 tablets by mouth every 6 (six) hours as needed for severe pain. 15 tablet Eustace Moore, MD     Controlled Substance Prescriptions [place] Controlled Substance Registry consulted? Yes, I have consulted the [place] Controlled Substances Registry for this patient, and feel the risk/benefit ratio today is favorable for proceeding with this prescription for a controlled substance.   Eustace Moore, MD 08/23/19 [phone]

## 2019-08-24 MED FILL — LOVASTATIN 20 MG TABS: 20 | 30 days supply | Qty: 60 | Fill #0

## 2019-08-24 MED FILL — LISINOPRIL-HCTZ 20-12.5 MG: 20-12.5 | 30 days supply | Qty: 60 | Fill #0

## 2019-09-01 MED FILL — ALENDRONATE NA 35 MG TAB: 35 | 28 days supply | Qty: 4 | Fill #2

## 2019-09-02 ENCOUNTER — Ambulatory Visit: Payer: Self-pay | Admitting: Orthopedic Surgery

## 2019-09-08 ENCOUNTER — Ambulatory Visit (INDEPENDENT_AMBULATORY_CARE_PROVIDER_SITE_OTHER): Payer: Self-pay | Admitting: Orthopedic Surgery

## 2019-09-08 ENCOUNTER — Encounter: Payer: Self-pay | Admitting: Orthopedic Surgery

## 2019-09-08 DIAGNOSIS — S92411A Displaced fracture of proximal phalanx of right great toe, initial encounter for closed fracture: Secondary | ICD-10-CM

## 2019-09-10 ENCOUNTER — Encounter: Payer: Self-pay | Admitting: Orthopedic Surgery

## 2019-09-10 NOTE — Progress Notes (Signed)
Office Visit Note   Patient: [name] [name]           Date of Birth: 01/11/1951           MRN: [id number] Visit Date: 09/08/2019 Requested by: Grayce SessionsEdwards, Michelle P, NP [address] [place],  [place] [id number] PCP: Grayce SessionsEdwards, Michelle P, NP  Subjective: No chief complaint on file.   HPI: [name] [name] is a 68 y.o. female who presents to the office complaining of right foot pain.  Patient injured her right great toe about 2 and half weeks ago after he tripped on a door frame and an iron fell on her foot.  She was seen in the ER where they took x-rays and diagnosed her with a proximal phalanx fracture.  She has been ambulating with a postop shoe.  She has been fully weightbearing in the shoe and using ice, NSAIDs, warm towel compresses for pain.  Her pain is improved over the last couple weeks.                ROS:  All systems reviewed are negative as they relate to the chief complaint within the history of present illness.  Patient denies fevers or chills.  Assessment & Plan: Visit Diagnoses: No diagnosis found.  Plan: Patient is a 68 year old female who presents from the ED with right great toe proximal phalanx fracture.  X-rays reviewed from the ED.  This is a complicated fracture but I think patient will do okay with conservative management.  Her pain is improving and she has good range of motion of the first metatarsophalangeal joint of the right foot.  Encourage patient to continue the use of her postoperative shoe.  She will continue to work on the MTP range of motion.  She will return in 3 weeks for clinical recheck with x-rays.  If she is still doing well we will transition her into a regular shoe at the next visit.  Follow-Up Instructions: No follow-ups on file.   Orders:  No orders of the defined types were placed in this encounter.  No orders of the defined types were placed in this encounter.     Procedures: No procedures performed   Clinical Data: No  additional findings.  Objective: Vital Signs: There were no vitals taken for this visit.  Physical Exam:  Constitutional: Patient appears well-developed HEENT:  Head: Normocephalic Eyes:EOM are normal Neck: Normal range of motion Cardiovascular: Normal rate Pulmonary/chest: Effort normal Neurologic: Patient is alert Skin: Skin is warm Psychiatric: Patient has normal mood and affect  Ortho Exam:  Right foot exam TTP over the base of the proximal phalanx of the right great toe Mild swelling and ecchymosis surrounding the first MTP joint Good range of motion of the first MTP joint 2+ DP pulse Sensation intact through all the toes on the right foot No tenderness throughout the rest of the forefoot No pain with stressing of the Lisfranc ligament.  Specialty Comments:  No specialty comments available.  Imaging: No results found.   PMFS History: Patient Active Problem List   Diagnosis Date Noted  . Osteopenia of multiple sites 02/05/2019  . Hyperlipidemia 02/05/2019  . 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/2017  . Post-traumatic osteoarthritis, right ankle and foot   . Seasonal allergic rhinitis due to pollen 05/12/2017  . Allergy   . Cataract 01/28/2017  . Presbyopia of both eyes 01/28/2017  . Pterygium eye, 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  . Astigmatism, 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  . 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 Acquired  bilateral flat feet 09/07/2015  . Osteoporosis 09/07/2015  . Transaminasemia 01/26/2015  . Generalized anxiety disorder 01/26/2015  . Hypertension 07/28/2014  . Gastritis 07/28/2014  . 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  . OBESITY 05/10/2009  . NEPHROLITHIASIS 06/15/2008  . Dyslipidemia 03/16/2008  . 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 BUNDLE BRANCH BLOCK, LEFT 12/11/2007  . PTSD 10/01/2007  . ARM PAIN, LEFT 10/01/2007   Past Medical History:  Diagnosis Date  . Allergy    spring pollen  . Arthritis   . Astigmatism, 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  . Cataract 01/28/2017  .  High cholesterol   . History of kidney stones   . 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 Hypertension   . Osteoarthritis of ankle, right    and Subtalar Joint  . Osteoporosis   . Presbyopia of both eyes 01/28/2017  . Pterygium eye, 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    History reviewed. No pertinent family history.  Past Surgical History:  Procedure Laterality Date  . ANKLE FUSION Right 12/24/2017   Tibiocalcaneal fusion  . ANKLE FUSION Right 12/24/2017   Procedure: RIGHT TIBIOCALCANEAL FUSION;  Surgeon: Newt Minion, MD;  Location: [facility];  Service: Orthopedics;  Laterality: Right;  . CHOLECYSTECTOMY     Social History   Occupational History  . Not on file  Tobacco Use  . Smoking status: Never Smoker  . Smokeless tobacco: Never Used  Substance and Sexual Activity  . Alcohol use: No    Alcohol/week: 0.0 standard drinks  . Drug use: No  . Sexual activity: Not Currently

## 2019-09-21 ENCOUNTER — Telehealth: Payer: Self-pay | Admitting: Primary Care

## 2019-09-21 NOTE — Telephone Encounter (Signed)
I spoke with Pt daughter and inform her that her mom is approve for CAF and she can come by and pick the letter

## 2019-09-21 NOTE — Telephone Encounter (Signed)
Patient called to get a copy of their [place] letter. States they have a orthopedic appt tomorrow Please follow up.

## 2019-09-22 ENCOUNTER — Ambulatory Visit: Payer: Self-pay

## 2019-09-22 ENCOUNTER — Ambulatory Visit (INDEPENDENT_AMBULATORY_CARE_PROVIDER_SITE_OTHER): Payer: Self-pay | Admitting: Orthopedic Surgery

## 2019-09-22 DIAGNOSIS — S92411A Displaced fracture of proximal phalanx of right great toe, initial encounter for closed fracture: Secondary | ICD-10-CM

## 2019-09-24 ENCOUNTER — Encounter: Payer: Self-pay | Admitting: Orthopedic Surgery

## 2019-09-24 NOTE — Progress Notes (Signed)
Post-Op Visit Note   Patient: [name] [name]           Date of Birth: 1951-06-07           MRN: [id number] Visit Date: 09/22/2019 PCP: Kerin Perna, NP   Assessment & Plan:  Chief Complaint:  Chief Complaint  Patient presents with  . Right Foot - Follow-up, Fracture   Visit Diagnoses:  1. Displaced fracture of proximal phalanx of right great toe, initial encounter for closed fracture     Plan: Patient is a 68 year old female who presents for reevaluation following right great toe proximal phalanx fracture sustained on 08/23/2019.  Patient is doing much better.  She is still ambulating with a postop shoe.  She is full weightbearing.  She still has some pain but only takes Tylenol for the pain.  She has great range of motion on exam and she has been doing MTP joint range of motion exercises since we last saw her.  She has no pain with range of motion and only mild tenderness to palpation at the fracture site.  X-rays look good today and show radiographic evidence of fracture healing.  Patient will transition to a regular shoe with full weightbearing status and she will follow-up with the office PRN.  Follow-Up Instructions: No follow-ups on file.   Orders:  Orders Placed This Encounter  Procedures  . XR Toe Great Right   No orders of the defined types were placed in this encounter.   Imaging: No results found.  PMFS History: Patient Active Problem List   Diagnosis Date Noted  . Osteopenia of multiple sites 02/05/2019  . Hyperlipidemia 02/05/2019  . 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/2017  . Post-traumatic osteoarthritis, right ankle and foot   . Seasonal allergic rhinitis due to pollen 05/12/2017  . Allergy   . Cataract 01/28/2017  . Presbyopia of both eyes 01/28/2017  . Pterygium eye, 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  . Astigmatism, 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  . 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 Acquired bilateral flat feet 09/07/2015  . Osteoporosis 09/07/2015  .  Transaminasemia 01/26/2015  . Generalized anxiety disorder 01/26/2015  . Hypertension 07/28/2014  . Gastritis 07/28/2014  . 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  . OBESITY 05/10/2009  . NEPHROLITHIASIS 06/15/2008  . Dyslipidemia 03/16/2008  . 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 BUNDLE BRANCH BLOCK, LEFT 12/11/2007  . PTSD 10/01/2007  . ARM PAIN, LEFT 10/01/2007   Past Medical History:  Diagnosis Date  . Allergy    spring pollen  . Arthritis   . Astigmatism, 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  . Cataract 01/28/2017  . High cholesterol   . History of kidney stones   . 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 Hypertension   . Osteoarthritis of ankle, right    and Subtalar Joint  . Osteoporosis   . Presbyopia of both eyes 01/28/2017  . Pterygium eye, 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    No family history on file.  Past Surgical History:  Procedure Laterality Date  . ANKLE FUSION Right 12/24/2017   Tibiocalcaneal fusion  . ANKLE FUSION Right 12/24/2017   Procedure: RIGHT TIBIOCALCANEAL FUSION;  Surgeon: Newt Minion, MD;  Location: [place];  Service: Orthopedics;  Laterality: Right;  . CHOLECYSTECTOMY     Social History   Occupational History  . Not on file  Tobacco Use  . Smoking status: Never Smoker  . Smokeless tobacco: Never Used  Substance and Sexual Activity  . Alcohol use: No    Alcohol/week: 0.0 standard drinks  . Drug use: No  .  Sexual activity: Not Currently

## 2019-09-30 ENCOUNTER — Other Ambulatory Visit: Payer: Self-pay

## 2019-09-30 ENCOUNTER — Encounter (HOSPITAL_COMMUNITY): Payer: Self-pay

## 2019-09-30 ENCOUNTER — Observation Stay (HOSPITAL_COMMUNITY)
Admission: EM | Admit: 2019-09-30 | Discharge: 2019-10-02 | Disposition: A | Discharge status: Home / Self Care | Payer: Self-pay | Attending: Emergency Medicine | Admitting: Emergency Medicine

## 2019-09-30 ENCOUNTER — Emergency Department (HOSPITAL_COMMUNITY): Payer: Self-pay

## 2019-09-30 DIAGNOSIS — I4891 Unspecified atrial fibrillation: Secondary | ICD-10-CM | POA: Diagnosis present

## 2019-09-30 DIAGNOSIS — R7989 Other specified abnormal findings of blood chemistry: Secondary | ICD-10-CM | POA: Diagnosis present

## 2019-09-30 DIAGNOSIS — E785 Hyperlipidemia, unspecified: Secondary | ICD-10-CM | POA: Diagnosis present

## 2019-09-30 DIAGNOSIS — I471 Supraventricular tachycardia: Principal | ICD-10-CM | POA: Insufficient documentation

## 2019-09-30 DIAGNOSIS — I10 Essential (primary) hypertension: Secondary | ICD-10-CM | POA: Diagnosis present

## 2019-09-30 DIAGNOSIS — R778 Other specified abnormalities of plasma proteins: Secondary | ICD-10-CM | POA: Diagnosis present

## 2019-09-30 DIAGNOSIS — Z23 Encounter for immunization: Secondary | ICD-10-CM | POA: Insufficient documentation

## 2019-09-30 DIAGNOSIS — Z20828 Contact with and (suspected) exposure to other viral communicable diseases: Secondary | ICD-10-CM | POA: Insufficient documentation

## 2019-09-30 HISTORY — DX: Unspecified atrial fibrillation: I48.91

## 2019-09-30 MED ORDER — METOPROLOL TARTRATE 5 MG/5ML IV SOLN: 5.0000 mg | Freq: Once | INTRAVENOUS | Status: DC

## 2019-09-30 MED ORDER — SODIUM CHLORIDE 0.9 % IV BOLUS
500.0000 mL | Freq: Once | INTRAVENOUS | Status: AC
  Administered 2019-10-01: 500 mL via INTRAVENOUS

## 2019-09-30 MED ORDER — METOPROLOL TARTRATE 25 MG PO TABS
12.5000 mg | ORAL_TABLET | Freq: Once | ORAL | Status: AC
  Administered 2019-09-30: 12.5 mg via ORAL
  Filled 2019-09-30: qty 1

## 2019-09-30 NOTE — ED Triage Notes (Signed)
Pt BIB [facility] for eval of SOB and palpitations. Pt reports she has been feeling poorly since 0400. EMS activated a Code STEMI, which was cancelled on arrival. EMS reports episodes of SVT en route x 3 for about 10 seconds each episode. Resolved spontaneously. Pt reports she is feeling better on arrival. Cards at bedside.

## 2019-09-30 NOTE — ED Provider Notes (Signed)
[facility] EMERGENCY DE[facility] Provider Note   CSN: [id number] Arrival date & time: 09/30/19  2226     History   Chief Complaint Chief Complaint  Patient presents with  . Code STEMI    HPI [name] [name] is a 68 y.o. female.     HPI  The patient is a Spanish speaking female with a history of HTN (on Lisinopril) and HTN (on Lovastatin) who presents to the ED with shortness of breath and heart palpitations. The patient states that she woke up this morning with her symptoms. They have been intermittent throughout the day. She developed worsening shortness of breath this evening and called EMS. EMS called regarding a possible CODE STEMI given a LBBB and possible ST elevations.  On [facility], the patient was in atrial fibrillation with RVR, with a LBBB on EKG that was present on an EKG in 2018. CODE STEMI was cancelled. The patient appeared to be going into and out of atrial fibrillation on the cardiac monitor.     Past Medical History:  Diagnosis Date  . Allergy    spring pollen  . Arthritis   . Astigmatism, 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  . Cataract 01/28/2017  . High cholesterol   . History of kidney stones   . 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 Hypertension   . Osteoarthritis of ankle, right    and Subtalar Joint  . Osteoporosis   . Presbyopia of both eyes 01/28/2017  . Pterygium eye, 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    Patient Active Problem List   Diagnosis Date Noted  . Osteopenia of multiple sites 02/05/2019  . Hyperlipidemia 02/05/2019  . 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/2017  . Post-traumatic osteoarthritis, right ankle and foot   . Seasonal allergic rhinitis due to pollen 05/12/2017  . Allergy   . Cataract 01/28/2017  . Presbyopia of both eyes 01/28/2017  . Pterygium eye, 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  . Astigmatism, 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  . 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 Acquired bilateral flat feet 09/07/2015  . Osteoporosis 09/07/2015  .  Transaminasemia 01/26/2015  . Generalized anxiety disorder 01/26/2015  . Hypertension 07/28/2014  . Gastritis 07/28/2014  . 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  . OBESITY 05/10/2009  . NEPHROLITHIASIS 06/15/2008  . Dyslipidemia 03/16/2008  . 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 BUNDLE BRANCH BLOCK, LEFT 12/11/2007  . PTSD 10/01/2007  . ARM PAIN, LEFT 10/01/2007    Past Surgical History:  Procedure Laterality Date  . ANKLE FUSION Right 12/24/2017   Tibiocalcaneal fusion  . ANKLE FUSION Right 12/24/2017   Procedure: RIGHT TIBIOCALCANEAL FUSION;  Surgeon: Nadara Mustarduda, Marcus V, MD;  Location: [facility] OR;  Service: Orthopedics;  Laterality: Right;  . CHOLECYSTECTOMY       OB History    Gravida  13   Para      Term      Preterm      AB  2   Living  10     SAB  2   TAB      Ectopic      Multiple      Live Births  10            Home Medications    Prior to Admission medications   Medication Sig Start Date End Date Taking? Authorizing Provider  alendronate (FOSAMAX) 35 MG tablet Take 1 tablet (35 mg total) by mouth every 7 (seven) days. Take with a full glass of water on an empty stomach. 06/16/19   Grayce SessionsEdwards, Michelle P, NP  Calcium Carb-Cholecalciferol (CALTRATE 600+D) 600-800 MG-UNIT TABS Take 2 tablets by mouth daily. 06/22/18   Loletta Specter, PA-C  HYDROcodone-acetaminophen (NORCO/VICODIN) 5-325 MG tablet Take 1-2 tablets by mouth every 6 (six) hours as needed for severe pain. 08/23/19   Eustace Moore, MD  lisinopril-hydrochlorothiazide (ZESTORETIC) 20-12.5 MG tablet Take 2 tablets by mouth daily. 07/15/19   Grayce Sessions, NP  lovastatin (MEVACOR) 20 MG tablet Take 2 tablets (40 mg total) by mouth at bedtime. 07/15/19   Grayce Sessions, NP    Family History History reviewed. No pertinent family history.  Social History Social History   Tobacco Use  . Smoking status: Never Smoker  . Smokeless tobacco: Never Used  Substance Use Topics  . Alcohol use:  No    Alcohol/week: 0.0 standard drinks  . Drug use: No     Allergies   Patient has no known allergies.   Review of Systems Review of Systems  Constitutional: Negative for chills and fever.  HENT: Negative for sore throat.   Respiratory: Positive for shortness of breath.   Cardiovascular: Positive for palpitations. Negative for chest pain.  Gastrointestinal: Negative for abdominal pain and vomiting.  Musculoskeletal: Negative for back pain.  Skin: Negative for rash.  Neurological: Negative for seizures and syncope.  All other systems reviewed and are negative.    Physical Exam Updated Vital Signs BP 96/67   Pulse 74   Resp (!) 24   Ht 5\' 2"  (1.575 m)   Wt 87 kg   SpO2 97%   BMI 35.08 kg/m   Physical Exam Vitals signs and nursing note reviewed.  Constitutional:      General: She is not in acute distress.    Appearance: She is well-developed. She is obese. She is not diaphoretic.  HENT:     Head: Normocephalic and atraumatic.  Eyes:     Conjunctiva/sclera: Conjunctivae normal.  Neck:     Musculoskeletal: Neck supple.  Cardiovascular:     Rate and Rhythm: Regular rhythm. Tachycardia present.     Pulses: Normal pulses.  Pulmonary:     Effort: Pulmonary effort is normal. No respiratory distress.     Breath sounds: Normal breath sounds.  Abdominal:     Palpations: Abdomen is soft.     Tenderness: There is no abdominal tenderness.  Skin:    General: Skin is warm and dry.  Neurological:     Mental Status: She is alert.      ED Treatments / Results  Labs (all labs ordered are listed, but only abnormal results are displayed) Labs Reviewed - No data to display  EKG EKG Interpretation  Date/Time:  Thursday September 30 2019 22:30:46 EDT Ventricular Rate:  102 PR Interval:    QRS Duration: 132 QT Interval:  372 QTC Calculation: 485 R Axis:   -36 Text Interpretation:  Sinus tachycardia Left bundle branch block Artifact Confirmed by 01-[birth year]-1973 [phone])  on 09/30/2019 10:35:07 PM   Radiology No results found.  Procedures Procedures (including critical care time)  Medications Ordered in ED Medications  metoprolol tartrate (LOPRESSOR) tablet 12.5 mg (has no administration in time range)     Initial Impression / Assessment and Plan / ED Course  I have reviewed the triage vital signs and the nursing notes.  Pertinent labs & imaging results that were available during my care of the patient were reviewed by me and considered in my medical decision making (see chart for details).        The  patient presents to the ED with new onset atrial fibrillation with RVR. On arrival to the [facility] ED, the patient was in atrial fibrillation with RVR, with a LBBB on EKG that was present on an EKG in 2018. CODE STEMI was cancelled. The patient appeared to be going into and out of atrial fibrillation on the cardiac monitor. EKG significant for sinus tachycardia, rate 102, LBBB present with no ischemic changes. Plan will be for workup of the patient's new onset atrial fibrillation. Labs and CXR ordered. 12.5mg  of PO Metoprolol was administered. Plan will be to consult hospitalist medicine for admission following initial workup. The patient was signed out to Dr. Leonette Monarch at 2350.   Final Clinical Impressions(s) / ED Diagnoses   Final diagnoses:  Atrial fibrillation with RVR [facility])    ED Discharge Orders    None       Regan Lemming, MD 09/30/19 2349    Carmin Muskrat, MD 10/02/19 [phone]

## 2019-10-01 ENCOUNTER — Observation Stay (HOSPITAL_BASED_OUTPATIENT_CLINIC_OR_DEPARTMENT_OTHER): Payer: Self-pay

## 2019-10-01 ENCOUNTER — Encounter (HOSPITAL_COMMUNITY): Payer: Self-pay | Admitting: Internal Medicine

## 2019-10-01 ENCOUNTER — Other Ambulatory Visit: Payer: Self-pay

## 2019-10-01 ENCOUNTER — Observation Stay (HOSPITAL_COMMUNITY): Payer: Self-pay

## 2019-10-01 DIAGNOSIS — I10 Essential (primary) hypertension: Secondary | ICD-10-CM

## 2019-10-01 DIAGNOSIS — E785 Hyperlipidemia, unspecified: Secondary | ICD-10-CM

## 2019-10-01 DIAGNOSIS — M7989 Other specified soft tissue disorders: Secondary | ICD-10-CM

## 2019-10-01 DIAGNOSIS — R609 Edema, unspecified: Secondary | ICD-10-CM

## 2019-10-01 DIAGNOSIS — I4891 Unspecified atrial fibrillation: Secondary | ICD-10-CM | POA: Diagnosis present

## 2019-10-01 DIAGNOSIS — I361 Nonrheumatic tricuspid (valve) insufficiency: Secondary | ICD-10-CM

## 2019-10-01 DIAGNOSIS — R778 Other specified abnormalities of plasma proteins: Secondary | ICD-10-CM

## 2019-10-01 DIAGNOSIS — I340 Nonrheumatic mitral (valve) insufficiency: Secondary | ICD-10-CM

## 2019-10-01 LAB — CBC WITH DIFFERENTIAL/PLATELET
Abs Immature Granulocytes: 0.04 10*3/uL (ref 0.00–0.07)
Basophils Absolute: 0 10*3/uL (ref 0.0–0.1)
Basophils Relative: 1 %
Eosinophils Absolute: 0.2 10*3/uL (ref 0.0–0.5)
Eosinophils Relative: 2 %
HCT: 38.7 % (ref 36.0–46.0)
Hemoglobin: 13.2 g/dL (ref 12.0–15.0)
Immature Granulocytes: 1 %
Lymphocytes Relative: 13 %
Lymphs Abs: 1.1 10*3/uL (ref 0.7–4.0)
MCH: 32.8 pg (ref 26.0–34.0)
MCHC: 34.1 g/dL (ref 30.0–36.0)
MCV: 96.3 fL (ref 80.0–100.0)
Monocytes Absolute: 0.7 10*3/uL (ref 0.1–1.0)
Monocytes Relative: 8 %
Neutro Abs: 6.7 10*3/uL (ref 1.7–7.7)
Neutrophils Relative %: 75 %
Platelets: 319 10*3/uL (ref 150–400)
RBC: 4.02 MIL/uL (ref 3.87–5.11)
RDW: 12.5 % (ref 11.5–15.5)
WBC: 8.8 10*3/uL (ref 4.0–10.5)
nRBC: 0 % (ref 0.0–0.2)

## 2019-10-01 LAB — HEPARIN LEVEL (UNFRACTIONATED): Heparin Unfractionated: 0.8 IU/mL — ABNORMAL HIGH (ref 0.30–0.70)

## 2019-10-01 LAB — BASIC METABOLIC PANEL
Anion gap: 10 (ref 5–15)
BUN: 19 mg/dL (ref 8–23)
CO2: 23 mmol/L (ref 22–32)
Calcium: 9.8 mg/dL (ref 8.9–10.3)
Chloride: 103 mmol/L (ref 98–111)
Creatinine, Ser: 0.68 mg/dL (ref 0.44–1.00)
GFR calc Af Amer: >60 mL/min (ref >60)
GFR calc non Af Amer: >60 mL/min (ref >60)
Glucose, Bld: 111 mg/dL — ABNORMAL HIGH (ref 70–99)
Potassium: 3.8 mmol/L (ref 3.5–5.1)
Sodium: 136 mmol/L (ref 135–145)

## 2019-10-01 LAB — TROPONIN I (HIGH SENSITIVITY)
Troponin I (High Sensitivity): 37 ng/L — ABNORMAL HIGH (ref <18)
Troponin I (High Sensitivity): 55 ng/L — ABNORMAL HIGH (ref <18)

## 2019-10-01 LAB — ECHOCARDIOGRAM COMPLETE
Height: 62 in
Weight: 3068.8 oz

## 2019-10-01 LAB — TSH: TSH: 1.173 u[IU]/mL (ref 0.350–4.500)

## 2019-10-01 LAB — HIV ANTIBODY (ROUTINE TESTING W REFLEX): HIV Screen 4th Generation wRfx: NONREACTIVE

## 2019-10-01 LAB — D-DIMER, QUANTITATIVE: D-Dimer, Quant: 0.73 ug/mL-FEU — ABNORMAL HIGH (ref 0.00–0.50)

## 2019-10-01 LAB — MAGNESIUM: Magnesium: 2.3 mg/dL (ref 1.7–2.4)

## 2019-10-01 LAB — BRAIN NATRIURETIC PEPTIDE: B Natriuretic Peptide: 65.6 pg/mL (ref 0.0–100.0)

## 2019-10-01 LAB — SARS CORONAVIRUS 2 (TAT 6-24 HRS): SARS Coronavirus 2: NEGATIVE

## 2019-10-01 MED ORDER — INFLUENZA VAC A&B SA ADJ QUAD 0.5 ML IM PRSY
0.5000 mL | PREFILLED_SYRINGE | INTRAMUSCULAR | Status: AC
  Administered 2019-10-02: 0.5 mL via INTRAMUSCULAR
  Filled 2019-10-01: qty 0.5

## 2019-10-01 MED ORDER — HYDROCHLOROTHIAZIDE 12.5 MG PO CAPS: 12.5000 mg | ORAL_CAPSULE | Freq: Every day | ORAL | Status: DC

## 2019-10-01 MED ORDER — PRAVASTATIN SODIUM 10 MG PO TABS: 20.0000 mg | ORAL_TABLET | Freq: Every day | ORAL | Status: DC

## 2019-10-01 MED ORDER — PNEUMOCOCCAL VAC POLYVALENT 25 MCG/0.5ML IJ INJ: 0.5000 mL | INJECTION | INTRAMUSCULAR | Status: DC

## 2019-10-01 MED ORDER — LISINOPRIL 20 MG PO TABS: 20.0000 mg | ORAL_TABLET | Freq: Every day | ORAL | Status: DC

## 2019-10-01 MED ORDER — HEPARIN (PORCINE) 25000 UT/250ML-% IV SOLN
950.0000 [IU]/h | INTRAVENOUS | Status: DC
  Administered 2019-10-01: 950 [IU]/h via INTRAVENOUS
  Administered 2019-10-01: 1050 [IU]/h via INTRAVENOUS
  Filled 2019-10-01 (×2): qty 250

## 2019-10-01 MED ORDER — METOPROLOL SUCCINATE ER 25 MG PO TB24
12.5000 mg | ORAL_TABLET | Freq: Every day | ORAL | Status: DC
  Administered 2019-10-02: 12.5 mg via ORAL
  Filled 2019-10-01: qty 1

## 2019-10-01 MED ORDER — PRAVASTATIN SODIUM 40 MG PO TABS
40.0000 mg | ORAL_TABLET | Freq: Every day | ORAL | Status: DC
  Administered 2019-10-01: 40 mg via ORAL
  Filled 2019-10-01: qty 1

## 2019-10-01 MED ORDER — HEPARIN BOLUS VIA INFUSION
4000.0000 [IU] | Freq: Once | INTRAVENOUS | Status: AC
  Administered 2019-10-01: 4000 [IU] via INTRAVENOUS
  Filled 2019-10-01: qty 4000

## 2019-10-01 MED ORDER — ACETAMINOPHEN 325 MG PO TABS: 650.0000 mg | ORAL_TABLET | Freq: Four times a day (QID) | ORAL | Status: DC | PRN

## 2019-10-01 MED ORDER — ONDANSETRON HCL 4 MG PO TABS: 4.0000 mg | ORAL_TABLET | Freq: Four times a day (QID) | ORAL | Status: DC | PRN

## 2019-10-01 MED ORDER — LISINOPRIL-HYDROCHLOROTHIAZIDE 20-12.5 MG PO TABS: 2.0000 | ORAL_TABLET | Freq: Every day | ORAL | Status: DC

## 2019-10-01 MED ORDER — ACETAMINOPHEN 650 MG RE SUPP: 650.0000 mg | Freq: Four times a day (QID) | RECTAL | Status: DC | PRN

## 2019-10-01 MED ORDER — ONDANSETRON HCL 4 MG/2ML IJ SOLN: 4.0000 mg | Freq: Four times a day (QID) | INTRAMUSCULAR | Status: DC | PRN

## 2019-10-01 MED ORDER — IOHEXOL 350 MG/ML SOLN
100.0000 mL | Freq: Once | INTRAVENOUS | Status: AC | PRN
  Administered 2019-10-01: 70 mL via INTRAVENOUS

## 2019-10-01 MED ORDER — HYDRALAZINE HCL 20 MG/ML IJ SOLN: 10.0000 mg | INTRAMUSCULAR | Status: DC | PRN

## 2019-10-01 MED ORDER — PNEUMOCOCCAL 13-VAL CONJ VACC IM SUSP
0.5000 mL | INTRAMUSCULAR | Status: AC
  Administered 2019-10-02: 0.5 mL via INTRAMUSCULAR
  Filled 2019-10-01: qty 0.5

## 2019-10-01 NOTE — ED Notes (Signed)
LE [place] completed, reported negative by tech.

## 2019-10-01 NOTE — Progress Notes (Signed)
ANTICOAGULATION CONSULT NOTE - Initial Consult  Pharmacy Consult for heparin Indication: atrial fibrillation  No Known Allergies  Patient Measurements: Height: 5\' 2"  (157.5 cm) Weight: 191 lb 12.8 oz (87 kg) IBW/kg (Calculated) : 50.1 Heparin Dosing Weight: 70 kg  Vital Signs: BP: 103/51 (10/09 0400) Pulse Rate: 51 (10/09 0245)  Labs: Recent Labs    09/30/19 2358  HGB 13.2  HCT 38.7  PLT 319  CREATININE 0.68  TROPONINIHS 37*    Estimated Creatinine Clearance: 67 mL/min (by C-G formula based on SCr of 0.68 mg/dL).  Assessment: CC/HPI: 68 yo f presenting with new onset afib  PMH: HTN HLD  Anticoag: none pta - iv hep for new onset afib  CHA2DS2/VAS Stroke Risk Points  Current as of 14 minutes ago     3 >= 2 Points: High Risk  1 - 1.99 Points: Medium Risk  0 Points: Low Risk    The patient's score has not changed in the past year.: No Change     Details    This score determines the patient's risk of having a stroke if the  patient has atrial fibrillation.     Points Metrics  0 Has Congestive Heart Failure:  No    Current as of 14 minutes ago  0 Has Vascular Disease:  No    Current as of 14 minutes ago  1 Has Hypertension:  Yes    Current as of 14 minutes ago  1 Age:  40    Current as of 14 minutes ago  0 Has Diabetes:  No    Current as of 14 minutes ago  0 Had Stroke:  No  Had TIA:  No  Had thromboembolism:  No    Current as of 14 minutes ago  1 Female:  Yes    Current as of 14 minutes ago    Renal: SCr 0.68  Heme/Onc: H&H 13.2/38.7, Plt 319  Goal of Therapy:  Heparin level 0.3-0.7 units/ml Monitor platelets by anticoagulation protocol: Yes   Plan:  Heparin bolus 4000 units x 1 Heparin gtt 1050 units/hr Initial HL 1200 Daily HL CBC F/U plans for oral [facility]  Levester Fresh, PharmD, BCPS, BCCCP Clinical Pharmacist [phone]  Please check AMION for all [place] numbers  10/01/2019 4:19 AM

## 2019-10-01 NOTE — H&P (Signed)
History and Physical    Savina Olshefski ELF:[id number] DOB: 06-24-1951 DOA: 09/30/2019  PCP: Grayce Sessions, NP  Patient coming from: Home.  Chief Complaint: Shortness of breath palpitations.  Engineer, structural used.  HPI: Samariah Hokenson is a 68 y.o. female with history of hypertension, hyperlipidemia started experiencing some palpitation and anxiety since yesterday morning.  Tuesday evening patient started having some chest pressure at this point patient decided to come to the ER.  Patient symptoms persisted until patient reached ER.  Denies any nausea vomiting abdominal pain fever chills productive cough or diarrhea.  Has not had any new medication changes.  Recently had a right great toe fracture.  ED Course: In the ER patient was found to be in A. fib with RVR.  EKG was showing L BBB.  Initially code STEMI was called in but canceled after patient EKG was showing A. fib.  Patient's rhythm spontaneously converted to sinus rhythm.  Given the symptoms patient admitted for further observation.  Chest x-ray was showing nothing acute.  Labs show troponin 37 and 55 BNP 65.6 creatinine 1.6 hemoglobin 13.2.  COVID-19 test was negative.  TSH 1.17.  Review of Systems: As per HPI, rest all negative.   Past Medical History:  Diagnosis Date  . Allergy    spring pollen  . Arthritis   . Astigmatism, 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  . Cataract 01/28/2017  . High cholesterol   . History of kidney stones   . 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 Hypertension   . Osteoarthritis of ankle, right    and Subtalar Joint  . Osteoporosis   . Presbyopia of both eyes 01/28/2017  . Pterygium eye, 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    Past Surgical History:  Procedure Laterality Date  . ANKLE FUSION Right 12/24/2017   Tibiocalcaneal fusion  . ANKLE FUSION Right 12/24/2017   Procedure: RIGHT TIBIOCALCANEAL FUSION;  Surgeon: Nadara Mustard, MD;  Location: [facility] OR;  Service: Orthopedics;  Laterality: Right;  .  CHOLECYSTECTOMY       reports that she has never smoked. She has never used smokeless tobacco. She reports that she does not drink alcohol or use drugs.  No Known Allergies  Family History  Family history unknown: Yes    Prior to Admission medications   Medication Sig Start Date End Date Taking? Authorizing Provider  alendronate (FOSAMAX) 35 MG tablet Take 1 tablet (35 mg total) by mouth every 7 (seven) days. Take with a full glass of water on an empty stomach. 06/16/19   Grayce Sessions, NP  Calcium Carb-Cholecalciferol (CALTRATE 600+D) 600-800 MG-UNIT TABS Take 2 tablets by mouth daily. 06/22/18   Loletta Specter, PA-C  HYDROcodone-acetaminophen (NORCO/VICODIN) 5-325 MG tablet Take 1-2 tablets by mouth every 6 (six) hours as needed for severe pain. 08/23/19   Eustace Moore, MD  lisinopril-hydrochlorothiazide (ZESTORETIC) 20-12.5 MG tablet Take 2 tablets by mouth daily. 07/15/19   Grayce Sessions, NP  lovastatin (MEVACOR) 20 MG tablet Take 2 tablets (40 mg total) by mouth at bedtime. 07/15/19   Grayce Sessions, NP    Physical Exam: Constitutional: Moderately built and nourished. Vitals:   10/01/19 0230 10/01/19 0245 10/01/19 0315 10/01/19 0330  BP: 104/65 118/64 (!) 102/58 (!) 100/51  Pulse: (!) 50 (!) 51    Resp: 12 19 15 11   SpO2: 99% 99%    Weight:      Height:       Eyes: Anicteric no pallor. ENMT: No discharge from the ears eyes  nose or mouth. Neck: No mass felt.  No neck rigidity. Respiratory: No rhonchi or crepitations. Cardiovascular: S1-S2 heard. Abdomen: Soft nontender bowel sounds present. Musculoskeletal: No edema. Skin: No rash. Neurologic: Alert awake oriented to time place and person.  Moves all extremities. Psychiatric: Appears normal per normal affect.   Labs on Admission: I have personally reviewed following labs and imaging studies  CBC: Recent Labs  Lab 09/30/19 2358  WBC 8.8  NEUTROABS 6.7  HGB 13.2  HCT 38.7  MCV 96.3  PLT 319    Basic Metabolic Panel: Recent Labs  Lab 09/30/19 2358  NA 136  K 3.8  CL 103  CO2 23  GLUCOSE 111*  BUN 19  CREATININE 0.68  CALCIUM 9.8   GFR: Estimated Creatinine Clearance: 67 mL/min (by C-G formula based on SCr of 0.68 mg/dL). Liver Function Tests: No results for input(s): AST, ALT, ALKPHOS, BILITOT, PROT, ALBUMIN in the last 168 hours. No results for input(s): LIPASE, AMYLASE in the last 168 hours. No results for input(s): AMMONIA in the last 168 hours. Coagulation Profile: No results for input(s): INR, PROTIME in the last 168 hours. Cardiac Enzymes: No results for input(s): CKTOTAL, CKMB, CKMBINDEX, TROPONINI in the last 168 hours. BNP (last 3 results) No results for input(s): PROBNP in the last 8760 hours. HbA1C: No results for input(s): HGBA1C in the last 72 hours. CBG: No results for input(s): GLUCAP in the last 168 hours. Lipid Profile: No results for input(s): CHOL, HDL, LDLCALC, TRIG, CHOLHDL, LDLDIRECT in the last 72 hours. Thyroid Function Tests: Recent Labs    09/30/19 2358  TSH 1.173   Anemia Panel: No results for input(s): VITAMINB12, FOLATE, FERRITIN, TIBC, IRON, RETICCTPCT in the last 72 hours. Urine analysis:    Component Value Date/Time   COLORURINE YELLOW 01/26/2015 1117   APPEARANCEUR CLEAR 01/26/2015 1117   LABSPEC 1.006 01/26/2015 1117   PHURINE 7.0 01/26/2015 1117   GLUCOSEU NEG 01/26/2015 1117   HGBUR NEG 01/26/2015 1117   HGBUR trace-intact 05/10/2009 0837   BILIRUBINUR NEG 01/26/2015 1117   BILIRUBINUR neg 12/05/2014 1500   KETONESUR NEG 01/26/2015 1117   PROTEINUR NEG 01/26/2015 1117   UROBILINOGEN 0.2 01/26/2015 1117   NITRITE NEG 01/26/2015 1117   LEUKOCYTESUR NEG 01/26/2015 1117   Sepsis Labs: @LABRCNTIP (procalcitonin:4,lacticidven:4) ) Recent Results (from the past 240 hour(s))  SARS CORONAVIRUS 2 (TAT 6-24 HRS) Nasopharyngeal Nasopharyngeal Swab     Status: None   Collection Time: 09/30/19 11:05 PM   Specimen:  Nasopharyngeal Swab  Result Value Ref Range Status   SARS Coronavirus 2 NEGATIVE NEGATIVE Final    Comment: (NOTE) SARS-CoV-2 target nucleic acids are NOT DETECTED. The SARS-CoV-2 RNA is generally detectable in upper and lower respiratory specimens during the acute phase of infection. Negative results do not preclude SARS-CoV-2 infection, do not rule out co-infections with other pathogens, and should not be used as the sole basis for treatment or other patient management decisions. Negative results must be combined with clinical observations, patient history, and epidemiological information. The expected result is Negative. Fact Sheet for Patients: HairSlick.nohttps://www.fda.gov/media/138098/download Fact Sheet for Healthcare Providers: quierodirigir.comhttps://www.fda.gov/media/138095/download This test is not yet approved or cleared by the [place] FDA and  has been authorized for detection and/or diagnosis of SARS-CoV-2 by FDA under an Emergency Use Authorization (EUA). This EUA will remain  in effect (meaning this test can be used) for the duration of the COVID-19 declaration under Section 56 4(b)(1) of the Act, 21 U.S.C. section 360bbb-3(b)(1), unless the authorization is  terminated or revoked sooner. Performed at [facility], [place] [address] [place], [place] [address]      Radiological Exams on Admission: Dg Chest Portable 1 View  Result Date: 10/01/2019 CLINICAL DATA:  Shortness of breath EXAM: PORTABLE CHEST 1 VIEW COMPARISON:  None. FINDINGS: The heart size and mediastinal contours are within normal limits. Both lungs are clear. The visualized skeletal structures are unremarkable. IMPRESSION: No active disease. Electronically Signed   By: Ulyses Jarred M.D.   On: 10/01/2019 00:10    EKG: Independently reviewed.  Sinus tachycardia with RBBB.  Assessment/Plan Principal Problem:   Atrial fibrillation (HCC) Active Problems:   Hypertension   Hyperlipidemia   Atrial fibrillation with  RVR (HCC)    1. A. fib with RVR presently in sinus rhythm.  Given the chads 2 vascular score of 3 I have started patient on heparin.  Patient was given 1 dose of metoprolol in the ER.  Presently blood pressures in the low normal.  TSH is normal.  Will check 2D echo get cardiology consult.  Will check d-dimer if positive will get CT angiogram. 2. Hypertension presently blood pressure running in the low normal holding antihypertensives.  PRN IV hydralazine. 3. Hyperlipidemia on statins. 4. Chronic LBBB.  EKG in 2018 also showed LBBB.  Check 2D echo.   DVT prophylaxis: Heparin. Code Status: Full code. Family Communication: Family at the bedside. Disposition Plan: Home. Consults called: Cardiology. Admission status: Observation.   Rise Patience MD [facility] Pager [phone].  If 7PM-7AM, please contact night-coverage [email or web address] Password [email or web address]  10/01/2019, 4:15 AM

## 2019-10-01 NOTE — ED Notes (Signed)
Attempt to start IV x 2 unsuccessful, additional nurses looking at site.  Pt understands and is cooperative and calm.  CT aware of delay.

## 2019-10-01 NOTE — Progress Notes (Addendum)
Patient admitted after midnight, care began in ED before midnight.   68 yo woman hx of htn, hyperlipidemia admitted with new onset afib with RVR as well as chest "pressure". ekg reveals L BBB same as prior. Elevated HS trop. Initially code STEMI called and this cancelled. Converted to SR spontaneously. CT angio negative for PE or other significant acute abnormalities. Received one dose 5mg  IV metoprolol and heparin gtt initiated.   A/P  1. A. fib with RVR. Remains in sinus rhythm.  Given the chads 2 vascular score of 3  Heparin gtt started.   Patient was given 1 dose of metoprolol in the ER.  Presently blood pressures controlled. TSH is normal.  Will check 2D echo. Await cardiology consult.   2. Hypertension presently blood pressure controlled. Home meds include lisinopril and HCTZ. Holding home meds for now.   PRN IV hydralazine. 3. Hyperlipidemia on statins. 4. Chronic LBBB.  EKG in 2018 also showed LBBB.  Check 2D echo await cards inpute     Santiago Glad m. Black, NP  ? SVT not a fib-- defer to cardiology recommendations.  HR upper 50s currently CTA only remarkable for hiatal hernia -used interpreter [place]. JV

## 2019-10-01 NOTE — ED Notes (Signed)
Dyanne Carrel NP completed rounds with pt and daughter.   They agree to have understanding at this time with no further acute questions.  Will talk more when Cardiology consults.  Pt status unchanged and able to sit upright for lunch.
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
## 2019-10-01 NOTE — Progress Notes (Signed)
ANTICOAGULATION CONSULT NOTE - Follow Up Consult  Pharmacy Consult for Heparin Indication: ro afib  No Known Allergies  Patient Measurements: Height: 5\' 2"  (157.5 cm) Weight: 185 lb 6.4 oz (84.1 kg) IBW/kg (Calculated) : 50.1 Heparin Dosing Weight: 69.1 kg  Vital Signs: Temp: 98.4 F (36.9 C) (10/09 1451) Temp Source: Oral (10/09 1451) BP: 142/74 (10/09 1451) Pulse Rate: 62 (10/09 1451)  Labs: Recent Labs    09/30/19 2358 10/01/19 0516 10/01/19 1453  HGB 13.2  --   --   HCT 38.7  --   --   PLT 319  --   --   HEPARINUNFRC  --   --  0.80*  CREATININE 0.68  --   --   TROPONINIHS 37* 55*  --     Estimated Creatinine Clearance: 65.8 mL/min (by C-G formula based on SCr of 0.68 mg/dL).   Assessment:  Anticoag: none pta - iv hep for new onset afib (vs SVT). HL 0.8 elevated. Chest CTA negative for PE.   Goal of Therapy:  Heparin level 0.3-0.7 units/ml Monitor platelets by anticoagulation protocol: Yes   Plan:  Decrease IV heparin to 950 units/hr. Recheck HL in 6 hrs. Daily HL CBC May be able to d/c IV heparin   Deztiny Sarra S. Alford Highland, PharmD, [place] Clinical Staff Pharmacist Eilene Ghazi Stillinger 10/01/2019,4:21 PM

## 2019-10-01 NOTE — ED Notes (Signed)
Patient transported to CT 

## 2019-10-01 NOTE — ED Notes (Signed)
IV team has completed IV start to R Ac after this RN tried x 2 and additional 2 RN's have looked and attempted.  ECHO tech now ready for exam.  Pt remains pain free with no questions at this time.

## 2019-10-01 NOTE — Progress Notes (Signed)
  Echocardiogram 2D Echocardiogram has been performed.  [name] 10/01/2019, 10:05 AM

## 2019-10-01 NOTE — Progress Notes (Signed)
Left lower ext venous  has been completed. Refer to [facility] under chart review to view preliminary results.   10/01/2019  11:39 AM Oza Oberle, Bonnye Fava

## 2019-10-01 NOTE — ED Notes (Signed)
Pt returns from CT scan.  [place] here for exam.

## 2019-10-01 NOTE — Progress Notes (Signed)
Pt reported a chewing problem due to missing teeth. Soft diet order received.  Idolina Primer, RN

## 2019-10-01 NOTE — ED Notes (Signed)
Pt alert and appropriate with not outward distress noted.  Per interpreter service pt denies any CP or SOB.  POC reviewed and need for new IV for CT to r/o PE.  Pt and daughter appear to understand well.  No current questions at this time.

## 2019-10-01 NOTE — ED Notes (Signed)
MD/ NP paged to review POC.  Pt and daughter are uncertain at this time about her admission and why she needs to stay.

## 2019-10-01 NOTE — ED Provider Notes (Signed)
I assumed care of this patient from Drs. Vanita Panda and [employer].  Please see their note for further details of Hx, PE.  Briefly patient is a 68 y.o. female who presented with new onset atrial fibrilation.   Current plan is to follow up labs and admit.  Mildly elevated trop. Rest of labs reassuring.  Will admit to medicine.     Fatima Blank, MD 10/01/19 [phone]

## 2019-10-01 NOTE — ED Notes (Signed)
ECHO completed and pt is complaining of leg cramping at this time. Pt does have + Homans sign to LLE with reported now cramping throughout the [place].  MD notified.

## 2019-10-01 NOTE — ED Notes (Signed)
Tele   Breakfast ordered  

## 2019-10-02 ENCOUNTER — Observation Stay (HOSPITAL_BASED_OUTPATIENT_CLINIC_OR_DEPARTMENT_OTHER): Payer: Self-pay

## 2019-10-02 DIAGNOSIS — I249 Acute ischemic heart disease, unspecified: Secondary | ICD-10-CM

## 2019-10-02 DIAGNOSIS — I471 Supraventricular tachycardia: Secondary | ICD-10-CM

## 2019-10-02 DIAGNOSIS — R7989 Other specified abnormal findings of blood chemistry: Secondary | ICD-10-CM

## 2019-10-02 LAB — CBC
HCT: 38.3 % (ref 36.0–46.0)
Hemoglobin: 12.9 g/dL (ref 12.0–15.0)
MCH: 32.4 pg (ref 26.0–34.0)
MCHC: 33.7 g/dL (ref 30.0–36.0)
MCV: 96.2 fL (ref 80.0–100.0)
Platelets: 305 10*3/uL (ref 150–400)
RBC: 3.98 MIL/uL (ref 3.87–5.11)
RDW: 12.7 % (ref 11.5–15.5)
WBC: 5.5 10*3/uL (ref 4.0–10.5)
nRBC: 0 % (ref 0.0–0.2)

## 2019-10-02 LAB — T4: T4, Total: 7.4 ug/dL (ref 4.5–12.0)

## 2019-10-02 LAB — NM MYOCAR MULTI W/SPECT W/WALL MOTION / EF
Estimated workload: 1 METS
Exercise duration (min): 5 min
Exercise duration (sec): 35 s
Rest HR: 66 {beats}/min

## 2019-10-02 LAB — HEPARIN LEVEL (UNFRACTIONATED)
Heparin Unfractionated: 0.56 IU/mL (ref 0.30–0.70)
Heparin Unfractionated: 0.65 IU/mL (ref 0.30–0.70)

## 2019-10-02 MED ORDER — TECHNETIUM TC 99M TETROFOSMIN IV KIT
30.0000 | PACK | Freq: Once | INTRAVENOUS | Status: AC | PRN
  Administered 2019-10-02: 10:00:00 30 via INTRAVENOUS

## 2019-10-02 MED ORDER — METOPROLOL SUCCINATE ER 25 MG PO TB24: 12.5000 mg | ORAL_TABLET | Freq: Every day | ORAL | 0 refills | Status: DC

## 2019-10-02 MED ORDER — REGADENOSON 0.4 MG/5ML IV SOLN
0.4000 mg | Freq: Once | INTRAVENOUS | Status: AC
  Administered 2019-10-02: 0.4 mg via INTRAVENOUS
  Filled 2019-10-02: qty 5

## 2019-10-02 MED ORDER — REGADENOSON 0.4 MG/5ML IV SOLN
INTRAVENOUS | Status: AC
  Filled 2019-10-02: qty 5

## 2019-10-02 MED ORDER — TECHNETIUM TC 99M TETROFOSMIN IV KIT
10.0000 | PACK | Freq: Once | INTRAVENOUS | Status: AC | PRN
  Administered 2019-10-02: 10 via INTRAVENOUS

## 2019-10-02 NOTE — Progress Notes (Signed)
ANTICOAGULATION CONSULT NOTE - Follow Up Consult  Pharmacy Consult for Heparin Indication: ?afib  No Known Allergies  Patient Measurements: Height: 5\' 2"  (157.5 cm) Weight: 187 lb 6.3 oz (85 kg) IBW/kg (Calculated) : 50.1 Heparin Dosing Weight: 69.1 kg  Vital Signs: Temp: 98.1 F (36.7 C) (10/10 0700) Temp Source: Oral (10/10 0700) BP: 124/61 (10/10 0700) Pulse Rate: 60 (10/10 0700)  Labs: Recent Labs    09/30/19 2358 10/01/19 0516 10/01/19 1453 10/01/19 2347 10/02/19 0442  HGB 13.2  --   --   --  12.9  HCT 38.7  --   --   --  38.3  PLT 319  --   --   --  305  HEPARINUNFRC  --   --  0.80* 0.65 0.56  CREATININE 0.68  --   --   --   --   TROPONINIHS 37* 55*  --   --   --     Estimated Creatinine Clearance: 66.2 mL/min (by C-G formula based on SCr of 0.68 mg/dL).   Assessment: Patient presented with shortness of breath and palpitations. H&P reports patient was found to be in atrial fibrillation but then spontaneously converted to sinus. However, no evidence of atrial fibrillation on review of EKG and telemetry. Looks more consistent with frequent brief episodes of SVT that resolved with PO Lopressor. Chest CTA negative for PE.  Patient was not receiving anticoagulation PTA - Pharmacy consulted to start heparin infusion for possible new onset afib (vs SVT). Chadsvasc score = 3  Confirmatory heparin level this morning remains therapeutic at 0.56 on 950 units/hr. CBC WNL and no active bleeding or line issues per nursing.    Goal of Therapy:  Heparin level 0.3-0.7 units/ml Monitor platelets by anticoagulation protocol: Yes   Plan:  Continue heparin at 950 units/hr Monitor daily heparin level and CBC Watch for s/sx of bleeding F/u on cardiology recs for anticoagulation for ?afib   Kennon Holter, PharmD PGY1 Ambulatory Care Pharmacy Resident [employer] Phone: [phone]

## 2019-10-02 NOTE — Progress Notes (Signed)
Darlina Rumpf presented for a nuclear stress test today.  No immediate complications.  Stress imaging is pending at this time.   Preliminary EKG findings may be listed in the chart, but the stress test result will not be finalized until perfusion imaging is complete.   [place], [place]  10/02/2019 10:02 AM

## 2019-10-02 NOTE — Discharge Summary (Signed)
Physician Discharge Summary  [name] [name] OEU:[id number] DOB: [birth year] DOA: 09/30/2019  PCP: Grayce Sessions, NP  Admit date: 09/30/2019 Discharge date: 10/02/2019  Admitted From: home Discharge disposition: home   Recommendations for Outpatient Follow-Up:   1. Low dose metoprolol started   Discharge Diagnosis:   Principal Problem: SVT NOT a fib Active Problems:   Dyslipidemia   Hypertension   Elevated troponin   Hyperlipidemia    Discharge Condition: Improved.  Diet recommendation: Low sodium, heart healthy.  Wound care: None.  Code status: Full.   History of Present Illness:   [name] [name] is a 68 y.o. female with history of hypertension, hyperlipidemia started experiencing some palpitation and anxiety since yesterday morning.  Tuesday evening patient started having some chest pressure at this point patient decided to come to the ER.  Patient symptoms persisted until patient reached ER.  Denies any nausea vomiting abdominal pain fever chills productive cough or diarrhea.  Has not had any new medication changes.  Recently had a right great toe fracture.   Hospital Course by Problem:   SVT: No recurrences but does have frequent PACs.  Toprol-XL 12.5 mg daily initiated on 10/01/2019.  Elevated troponin/left bundle branch block: Nuclear stress test reviewed above which was normal.   Elevated d-dimer: Chest CTA negative for pulmonary embolism.  Hypertension: Blood pressure is normal.  No changes to therapy.   Hyperlipidemia: Continue lovastatin.     Medical Consultants:   cards   Discharge Exam:   Vitals:   10/02/19 0958 10/02/19 1252  BP: 113/63 125/66  Pulse:    Resp:    Temp:    SpO2:     Vitals:   10/02/19 0955 10/02/19 0956 10/02/19 0958 10/02/19 1252  BP: (!) 146/75 117/66 113/63 125/66  Pulse:      Resp:      Temp:      TempSrc:      SpO2:      Weight:      Height:        General exam: Appears calm  and comfortable.   The results of significant diagnostics from this hospitalization (including imaging, microbiology, ancillary and laboratory) are listed below for reference.     Procedures and Diagnostic Studies:   Ct Angio Chest Pe W Or Wo Contrast  Result Date: 10/01/2019 CLINICAL DATA:  Shortness of breath. New onset of atrial fibrillation. EXAM: CT ANGIOGRAPHY CHEST WITH CONTRAST TECHNIQUE: Multidetector CT imaging of the chest was performed using the standard protocol during bolus administration of intravenous contrast. Multiplanar CT image reconstructions and MIPs were obtained to evaluate the vascular anatomy. CONTRAST:  2mL OMNIPAQUE IOHEXOL 350 MG/ML SOLN COMPARISON:  Chest x-ray dated 10/01/2019 and report of CT scan of the lumbar spine dated 12/24/2004 FINDINGS: Cardiovascular: Satisfactory opacification of the pulmonary arteries to the segmental level. No evidence of pulmonary embolism. Normal heart size. No pericardial effusion. Aortic atherosclerosis. Mediastinum/Nodes: No enlarged mediastinal, hilar, or axillary lymph nodes. Thyroid gland and trachea demonstrate no significant findings. Moderate hiatal hernia. Lungs/Pleura: Lungs are clear except for minimal linear atelectasis. No pleural effusion or pneumothorax. Upper Abdomen: No acute abnormality. 12 mm cyst in the posterior aspect of the right lobe of the liver. Cholecystectomy. Musculoskeletal: Old compression fracture of T12. Review of the MIP images confirms the above findings. IMPRESSION: 1. No pulmonary emboli or other significant acute abnormalities. 2. Moderate hiatal hernia. Aortic Atherosclerosis (ICD10-I70.0). Electronically Signed   By: Francene Boyers M.D.  On: 10/01/2019 11:21   Dg Chest Portable 1 View  Result Date: 10/01/2019 CLINICAL DATA:  Shortness of breath EXAM: PORTABLE CHEST 1 VIEW COMPARISON:  None. FINDINGS: The heart size and mediastinal contours are within normal limits. Both lungs are clear. The  visualized skeletal structures are unremarkable. IMPRESSION: No active disease. Electronically Signed   By: Ulyses Jarred M.D.   On: 10/01/2019 00:10   Vas [place] Lower Extremity Venous (dvt)  Result Date: 10/01/2019  Lower Venous Study Indications: Swelling, Edema, and Positive Homan's sign.  Comparison Study: No priors. Performing Technologist: Oda Cogan RDMS, RVT  Examination Guidelines: A complete evaluation includes B-mode imaging, spectral Doppler, color Doppler, and power Doppler as needed of all accessible portions of each vessel. Bilateral testing is considered an integral part of a complete examination. Limited examinations for reoccurring indications may be performed as noted.  +-----+---------------+---------+-----------+----------+--------------+ RIGHTCompressibilityPhasicitySpontaneityPropertiesThrombus Aging +-----+---------------+---------+-----------+----------+--------------+ CFV  Full           Yes      Yes                                 +-----+---------------+---------+-----------+----------+--------------+ SFJ  Full                                                        +-----+---------------+---------+-----------+----------+--------------+   +---------+---------------+---------+-----------+----------+--------------+ LEFT     CompressibilityPhasicitySpontaneityPropertiesThrombus Aging +---------+---------------+---------+-----------+----------+--------------+ CFV      Full           Yes      Yes                                 +---------+---------------+---------+-----------+----------+--------------+ SFJ      Full                                                        +---------+---------------+---------+-----------+----------+--------------+ FV Prox  Full                                                        +---------+---------------+---------+-----------+----------+--------------+ FV Mid   Full                                                         +---------+---------------+---------+-----------+----------+--------------+ FV DistalFull                                                        +---------+---------------+---------+-----------+----------+--------------+ PFV      Full                                                        +---------+---------------+---------+-----------+----------+--------------+  POP      Full           Yes      Yes                                 +---------+---------------+---------+-----------+----------+--------------+ PTV      Full                                                        +---------+---------------+---------+-----------+----------+--------------+ PERO     Full                                                        +---------+---------------+---------+-----------+----------+--------------+     Summary: Right: No evidence of common femoral vein obstruction. Left: There is no evidence of deep vein thrombosis in the lower extremity. A cystic structure is found in the popliteal fossa.  *See table(s) above for measurements and observations.    Preliminary      Labs:   Basic Metabolic Panel: Recent Labs  Lab 09/30/19 2358 10/01/19 0516  NA 136  --   K 3.8  --   CL 103  --   CO2 23  --   GLUCOSE 111*  --   BUN 19  --   CREATININE 0.68  --   CALCIUM 9.8  --   MG  --  2.3   GFR Estimated Creatinine Clearance: 66.2 mL/min (by C-G formula based on SCr of 0.68 mg/dL). Liver Function Tests: No results for input(s): AST, ALT, ALKPHOS, BILITOT, PROT, ALBUMIN in the last 168 hours. No results for input(s): LIPASE, AMYLASE in the last 168 hours. No results for input(s): AMMONIA in the last 168 hours. Coagulation profile No results for input(s): INR, PROTIME in the last 168 hours.  CBC: Recent Labs  Lab 09/30/19 2358 10/02/19 0442  WBC 8.8 5.5  NEUTROABS 6.7  --   HGB 13.2 12.9  HCT 38.7 38.3  MCV 96.3 96.2  PLT 319 305   Cardiac Enzymes:  No results for input(s): CKTOTAL, CKMB, CKMBINDEX, TROPONINI in the last 168 hours. BNP: Invalid input(s): POCBNP CBG: No results for input(s): GLUCAP in the last 168 hours. D-Dimer Recent Labs    10/01/19 0516  DDIMER 0.73*   Hgb A1c No results for input(s): HGBA1C in the last 72 hours. Lipid Profile No results for input(s): CHOL, HDL, LDLCALC, TRIG, CHOLHDL, LDLDIRECT in the last 72 hours. Thyroid function studies Recent Labs    09/30/19 2358  TSH 1.173  T4TOTAL 7.4   Anemia work up No results for input(s): VITAMINB12, FOLATE, FERRITIN, TIBC, IRON, RETICCTPCT in the last 72 hours. Microbiology Recent Results (from the past 240 hour(s))  SARS CORONAVIRUS 2 (TAT 6-24 HRS) Nasopharyngeal Nasopharyngeal Swab     Status: None   Collection Time: 09/30/19 11:05 PM   Specimen: Nasopharyngeal Swab  Result Value Ref Range Status   SARS Coronavirus 2 NEGATIVE NEGATIVE Final    Comment: (NOTE) SARS-CoV-2 target nucleic acids are NOT DETECTED. The SARS-CoV-2 RNA is generally detectable in upper and lower respiratory specimens during the acute phase of infection. Negative results  do not preclude SARS-CoV-2 infection, do not rule out co-infections with other pathogens, and should not be used as the sole basis for treatment or other patient management decisions. Negative results must be combined with clinical observations, patient history, and epidemiological information. The expected result is Negative. Fact Sheet for Patients: HairSlick.no Fact Sheet for Healthcare Providers: quierodirigir.com This test is not yet approved or cleared by the [place] FDA and  has been authorized for detection and/or diagnosis of SARS-CoV-2 by FDA under an Emergency Use Authorization (EUA). This EUA will remain  in effect (meaning this test can be used) for the duration of the COVID-19 declaration under Section 56 4(b)(1) of the Act, 21  U.S.C. section 360bbb-3(b)(1), unless the authorization is terminated or revoked sooner. Performed at [facility], [address] [place], [place] [address]      Discharge Instructions:   Discharge Instructions    Diet - low sodium heart healthy   Complete by: As directed    Increase activity slowly   Complete by: As directed      Allergies as of 10/02/2019   No Known Allergies     Medication List    STOP taking these medications   HYDROcodone-acetaminophen 5-325 MG tablet Commonly known as: NORCO/VICODIN   lisinopril-hydrochlorothiazide 20-12.5 MG tablet Commonly known as: Zestoretic     TAKE these medications   acetaminophen 500 MG tablet Commonly known as: TYLENOL Take 1,000 mg by mouth every 6 (six) hours as needed for mild pain or headache.   alendronate 35 MG tablet Commonly known as: FOSAMAX Take 1 tablet (35 mg total) by mouth every 7 (seven) days. Take with a full glass of water on an empty stomach. What changed: when to take this   Calcium Carb-Cholecalciferol 600-800 MG-UNIT Tabs Commonly known as: Caltrate 600+D Take 2 tablets by mouth daily.   lovastatin 20 MG tablet Commonly known as: MEVACOR Take 2 tablets (40 mg total) by mouth at bedtime.   metoprolol succinate 25 MG 24 hr tablet Commonly known as: TOPROL-XL Take 0.5 tablets (12.5 mg total) by mouth daily. Start taking on: October 03, 2019   tetrahydrozoline 0.05 % ophthalmic solution Place 1 drop into both eyes daily as needed (dry eyes).      Follow-up Information    Grayce Sessions, NP Follow up in 1 week(s).   Specialty: Internal Medicine Contact information: [address] [place] [address] [phone]        Chilton Si, MD Follow up.   Specialty: Cardiology Why: PRN Contact information: [address] [place] [address] [place] [address] [phone]            Time coordinating discharge: 25 min  Signed:  Joseph Art DO  [facility] 10/02/2019, 1:33 PM

## 2019-10-02 NOTE — Progress Notes (Signed)
Progress Note  Patient Name: [name] [name] Date of Encounter: 10/02/2019  Primary Cardiologist: No primary care provider on file.   Subjective   Denies chest pain, palpitations, shortness of breath.  Inpatient Medications    Scheduled Meds: . influenza vaccine adjuvanted  0.5 mL Intramuscular Tomorrow-1000  . metoprolol succinate  12.5 mg Oral Daily  . pneumococcal 13-valent conjugate vaccine  0.5 mL Intramuscular Tomorrow-1000  . pravastatin  40 mg Oral q1800  . regadenoson       Continuous Infusions:  PRN Meds: acetaminophen **OR** acetaminophen, hydrALAZINE, ondansetron **OR** ondansetron (ZOFRAN) IV   Vital Signs    Vitals:   10/02/19 0940 10/02/19 0955 10/02/19 0956 10/02/19 0958  BP: 130/70 (!) 146/75 117/66 113/63  Pulse:      Resp:      Temp:      TempSrc:      SpO2:      Weight:      Height:        Intake/Output Summary (Last 24 hours) at 10/02/2019 1257 Last data filed at 10/02/2019 0800 Gross per 24 hour  Intake 105.93 ml  Output -  Net 105.93 ml   Filed Weights   09/30/19 2246 10/01/19 1500 10/02/19 0700  Weight: 87 kg 84.1 kg 85 kg    Telemetry    Sinus rhythm with frequent PACs- Personally Reviewed   Physical Exam   GEN: No acute distress.   Neck: No JVD Cardiac: RRR, no murmurs, rubs, or gallops.  Respiratory: Clear to auscultation bilaterally. GI: Soft, nontender, non-distended  MS: No edema; No deformity. Neuro:  Nonfocal  Psych: Normal affect   Labs    Chemistry Recent Labs  Lab 09/30/19 2358  NA 136  K 3.8  CL 103  CO2 23  GLUCOSE 111*  BUN 19  CREATININE 0.68  CALCIUM 9.8  GFRNONAA >60  GFRAA >60  ANIONGAP 10     Hematology Recent Labs  Lab 09/30/19 2358 10/02/19 0442  WBC 8.8 5.5  RBC 4.02 3.98  HGB 13.2 12.9  HCT 38.7 38.3  MCV 96.3 96.2  MCH 32.8 32.4  MCHC 34.1 33.7  RDW 12.5 12.7  PLT 319 305    Cardiac EnzymesNo results for input(s): TROPONINI in the last 168 hours. No results  for input(s): TROPIPOC in the last 168 hours.   BNP Recent Labs  Lab 09/30/19 2358  BNP 65.6     DDimer  Recent Labs  Lab 10/01/19 0516  DDIMER 0.73*     Radiology    Ct Angio Chest Pe W Or Wo Contrast  Result Date: 10/01/2019 CLINICAL DATA:  Shortness of breath. New onset of atrial fibrillation. EXAM: CT ANGIOGRAPHY CHEST WITH CONTRAST TECHNIQUE: Multidetector CT imaging of the chest was performed using the standard protocol during bolus administration of intravenous contrast. Multiplanar CT image reconstructions and MIPs were obtained to evaluate the vascular anatomy. CONTRAST:  55mL OMNIPAQUE IOHEXOL 350 MG/ML SOLN COMPARISON:  Chest x-ray dated 10/01/2019 and report of CT scan of the lumbar spine dated 12/24/2004 FINDINGS: Cardiovascular: Satisfactory opacification of the pulmonary arteries to the segmental level. No evidence of pulmonary embolism. Normal heart size. No pericardial effusion. Aortic atherosclerosis. Mediastinum/Nodes: No enlarged mediastinal, hilar, or axillary lymph nodes. Thyroid gland and trachea demonstrate no significant findings. Moderate hiatal hernia. Lungs/Pleura: Lungs are clear except for minimal linear atelectasis. No pleural effusion or pneumothorax. Upper Abdomen: No acute abnormality. 12 mm cyst in the posterior aspect of the right lobe of the liver. Cholecystectomy.  Musculoskeletal: Old compression fracture of T12. Review of the MIP images confirms the above findings. IMPRESSION: 1. No pulmonary emboli or other significant acute abnormalities. 2. Moderate hiatal hernia. Aortic Atherosclerosis (ICD10-I70.0). Electronically Signed   By: Francene BoyersJames  Maxwell M.D.   On: 10/01/2019 11:21   Nm Myocar Multi W/spect W/wall Motion / Ef  Result Date: 10/02/2019  Defect 1: There is a medium defect of moderate severity present in the mid anteroseptal, mid inferoseptal and mid inferior location. This is due to soft tissue attenuation.  This is a low risk study. No  myocardial ischemia or scar.  Nuclear stress EF: 69%.  LBBB seen throughout study.    Dg Chest Portable 1 View  Result Date: 10/01/2019 CLINICAL DATA:  Shortness of breath EXAM: PORTABLE CHEST 1 VIEW COMPARISON:  None. FINDINGS: The heart size and mediastinal contours are within normal limits. Both lungs are clear. The visualized skeletal structures are unremarkable. IMPRESSION: No active disease. Electronically Signed   By: Deatra RobinsonKevin  Herman M.D.   On: 10/01/2019 00:10   Vas [place] Lower Extremity Venous (dvt)  Result Date: 10/01/2019  Lower Venous Study Indications: Swelling, Edema, and Positive Homan's sign.  Comparison Study: No priors. Performing Technologist: Marilynne Halstedita Sturdivant RDMS, RVT  Examination Guidelines: A complete evaluation includes B-mode imaging, spectral Doppler, color Doppler, and power Doppler as needed of all accessible portions of each vessel. Bilateral testing is considered an integral part of a complete examination. Limited examinations for reoccurring indications may be performed as noted.  +-----+---------------+---------+-----------+----------+--------------+ RIGHTCompressibilityPhasicitySpontaneityPropertiesThrombus Aging +-----+---------------+---------+-----------+----------+--------------+ CFV  Full           Yes      Yes                                 +-----+---------------+---------+-----------+----------+--------------+ SFJ  Full                                                        +-----+---------------+---------+-----------+----------+--------------+   +---------+---------------+---------+-----------+----------+--------------+ LEFT     CompressibilityPhasicitySpontaneityPropertiesThrombus Aging +---------+---------------+---------+-----------+----------+--------------+ CFV      Full           Yes      Yes                                 +---------+---------------+---------+-----------+----------+--------------+ SFJ      Full                                                         +---------+---------------+---------+-----------+----------+--------------+ FV Prox  Full                                                        +---------+---------------+---------+-----------+----------+--------------+ FV Mid   Full                                                        +---------+---------------+---------+-----------+----------+--------------+  FV DistalFull                                                        +---------+---------------+---------+-----------+----------+--------------+ PFV      Full                                                        +---------+---------------+---------+-----------+----------+--------------+ POP      Full           Yes      Yes                                 +---------+---------------+---------+-----------+----------+--------------+ PTV      Full                                                        +---------+---------------+---------+-----------+----------+--------------+ PERO     Full                                                        +---------+---------------+---------+-----------+----------+--------------+     Summary: Right: No evidence of common femoral vein obstruction. Left: There is no evidence of deep vein thrombosis in the lower extremity. A cystic structure is found in the popliteal fossa.  *See table(s) above for measurements and observations.    Preliminary     Cardiac Studies   Nuclear stress test 10/02/2019:   Defect 1: There is a medium defect of moderate severity present in the mid anteroseptal, mid inferoseptal and mid inferior location. This is due to soft tissue attenuation.  This is a low risk study. No myocardial ischemia or scar.  Nuclear stress EF: 69%.  LBBB seen throughout study.   Echocardiogram 10/01/2019: Impressions: 1. Left ventricular ejection fraction, by visual estimation, is 60 to 65%. The left ventricle has  normal function. Normal left ventricular size. There is no left ventricular hypertrophy. 2. Left ventricular diastolic Doppler parameters are consistent with impaired relaxation pattern of LV diastolic filling. 3. Global right ventricle has normal systolic function.The right ventricular size is normal. No increase in right ventricular wall thickness. 4. Left atrial size was normal. 5. Right atrial size was normal. 6. The mitral valve is normal in structure. Mild mitral valve regurgitation. No evidence of mitral stenosis. 7. The tricuspid valve is normal in structure. Tricuspid valve regurgitation mild-moderate. 8. The aortic valve is normal in structure. Aortic valve regurgitation is mild by color flow Doppler. Structurally normal aortic valve, with no evidence of sclerosis or stenosis. 9. The pulmonic valve was normal in structure. Pulmonic valve regurgitation is not visualized by color flow Doppler. 10. 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 11. Normal pulmonary artery systolic pressure. 12. The inferior vena cava is normal in size with greater  than 50% respiratory variability, suggesting right atrial pressure of 3 mmHg.  Patient Profile     69 y.o. female with a history of hypertension and hyperlipidemia but no known cardiac disease who is being seen today for the evaluation of atrial fibrillation and elevated troponin at the request of Dr. Toniann Fail.  Assessment & Plan    1.  SVT: No recurrences but does have frequent PACs.  Toprol-XL 12.5 mg daily initiated on 10/01/2019.  2.  Elevated troponin/left bundle branch block: Nuclear stress test reviewed above which was normal.  3.  Elevated d-dimer: Chest CTA negative for pulmonary embolism.  4.  Hypertension: Blood pressure is normal.  No changes to therapy.  5.  Hyperlipidemia: Continue lovastatin.   [facility] will sign off.   Medication Recommendations:  Toprol XL 12.5 mg daily Other  recommendations (labs, testing, etc):  None Follow up as an outpatient:  We will arrange  For questions or updates, please contact [facility] Please consult [email or web address] for contact info under Cardiology/STEMI.      Signed, Prentice Docker, MD  10/02/2019, 12:57 PM

## 2019-10-02 NOTE — Progress Notes (Signed)
Pt stable, dc home via wheelchair 

## 2019-10-02 NOTE — Discharge Instructions (Signed)
Metoprolol extended-release capsules Qu es este medicamento? El METOPROLOL es un betabloqueador. Los betabloqueadores reducen la carga de trabajo del corazn y lo ayudan a latir a un ritmo ms regular. Este medicamento se [place] para tratar la presin sangunea alta y para Psychologist, sport and exercise en el pecho. Tambin se [place] despus de un ataque cardiaco para prevenir que ocurra otro ataque cardiaco. Este medicamento puede ser utilizado para otros usos; si tiene alguna pregunta consulte con su proveedor de atencin mdica o con su farmacutico. MARCAS COMUNES: [place] Qu le debo informar a mi profesional de la salud antes de tomar este medicamento? Necesitan saber si usted presenta alguno de los [employer] o situaciones: diabetes enfermedad cardiaca enfermedad heptica enfermedad pulmonar o respiratoria, como asma feocromocitoma enfermedad tiroidea una reaccin alrgica o inusual al metoprolol, a otros betabloqueadores, medicamentos, alimentos, colorantes o conservantes si est embarazada o buscando quedar embarazada si est amamantando a un beb Cmo debo utilizar este medicamento? [employer] medicamento por va oral. Las cpsulas se pueden tragar enteras o abrirlas cuidadosamente y se puede espolvorear el contenido sobre una pequea cantidad (cucharada de t) de comida blanda, tal como pur de [place], pudn o yogurt. Esta mezcla debe tragarse dentro de los 60 minutos y no almacenarse para uso futuro. No mastique este medicamento. Puede tomarlo con o sin alimentos. Si el Social worker, tmelo con alimentos. Tome su medicamento a intervalos regulares. No lo tome con una frecuencia mayor a la indicada. No deje de tomarlo, excepto si as lo indica su mdico. Hable con su pediatra para informarse acerca del uso de este medicamento en nios. Aunque este medicamento se puede recetar a nios tan pequeos como de 6 aos de edad con ciertas afecciones, existen precauciones que  deben tomarse. Sobredosis: Pngase en contacto inmediatamente con un centro toxicolgico o una sala de urgencia si usted cree que haya tomado demasiado medicamento. ATENCIN: [employer] es solo para usted. No comparta este medicamento con nadie. Qu sucede si me olvido de una dosis? Si olvida una dosis, adminstrela lo antes posible. Si es casi la hora de la prxima dosis, tome solo esa dosis. No tome dosis adicionales o dobles. Qu puede interactuar con este medicamento? Este medicamento podra interactuar con los siguientes frmacos: ciertos medicamentos para la presin sangunea, enfermedad cardiaca y ritmo cardiaco irregular epinefrina fluoxetina IMAO, tales como Carbex, Eldepryl, Marplan, [place]il y Parnate paroxetina reserpina Puede ser que esta lista no menciona todas las posibles interacciones. Informe a su profesional de [employer] de [employer] productos a base de hierbas, medicamentos de [place] o suplementos nutritivos que est tomando. Si usted fuma, consume bebidas alcohlicas o si utiliza drogas ilegales, indqueselo tambin a su profesional de [employer]. Algunas sustancias pueden interactuar con su medicamento. A qu debo estar atento al usar PPL Corporation? Puede experimentar somnolencia o mareos. No conduzca, no utilice maquinaria ni haga nada que Scientist, research (life sciences) en estado de alerta hasta que sepa cmo le afecta este medicamento. No se siente ni se ponga de pie con rapidez, especialmente si es un paciente de edad avanzada. Esto reduce el riesgo de mareos o [employer]. El alcohol puede interferir con el efecto de [place]. Evite consumir bebidas alcohlicas. Visite a su mdico o a su profesional de la salud para que revise regularmente su evolucin. Revise su presin sangunea como se le indique. Pregunte a su mdico o a su profesional de la salud cul debe ser su presin sangunea y cundo Sports coach. No  se trate usted mismo si tiene tos, resfro o Librarian, academic est usando este medicamento sin consultar con su mdico o con su profesional de [employer]	[place]. Algunos ingredientes pueden aumentar su presin sangunea. Este medicamento puede aumentar los niveles de Dispensing optician. Pregntele a su profesional de la salud si es Chartered loss adjuster cambios en la dieta o en los medicamentos si usted tiene diabetes. Qu efectos secundarios puedo tener al [employer] este medicamento? Efectos secundarios que debe informar a su mdico o a Barrister's clerk de la salud tan pronto como sea posible: Chief of Staff, como erupcin cutnea, comezn/picazn o urticaria, e hinchazn de la cara, los labios o la lengua enfriamiento de manos o pies signos y sntomas de niveles altos de Location manager en la sangre, tales como tener ms sed o apetito, o tener que orinar con mayor frecuencia que lo habitual. Tambin puede sentirse muy cansado o tener visin borrosa. signos y sntomas de presin sangunea baja, tales como mareos, sensacin de [place] o aturdimiento, cadas, cansancio o debilidad inusual signos de empeoramiento de la insuficiencia cardiaca, tales como problemas respiratorios, hinchazn en las piernas y los pies ideas suicidas u otros cambios en el estado de nimo pulso cardiaco inusualmente lento Efectos secundarios que generalmente no requieren atencin mdica (debe informarlos a su mdico o a Barrister's clerk de la salud si persisten o si son molestos): ansiedad cambios en el deseo o desempeo sexual diarrea dolor de cabeza dificultad para dormir Higher education careers adviser Puede ser que esta lista no menciona todos los posibles efectos secundarios. Comunquese a su mdico por asesoramiento mdico [employer]. Usted puede informar los efectos secundarios a la FDA por telfono al 1-800-FDA-1088. Dnde debo guardar mi medicina? Mantenga fuera del alcance de los nios. Guarde a [employer], entre 15 y 2 grados Celsius (36 y 61 grados Fahrenheit). Deseche  todo el medicamento que no haya utilizado despus de la fecha de vencimiento. ATENCIN: Este folleto es un resumen. Puede ser que no cubra toda la posible informacin. Si usted tiene preguntas acerca de esta medicina, consulte con su mdico, su farmacutico o su profesional de Technical sales engineer.  2020 Elsevier/Gold Standard (2019-02-09 00:00:00)

## 2019-10-02 NOTE — Progress Notes (Signed)
ANTICOAGULATION CONSULT NOTE - Follow Up Consult  Pharmacy Consult for Heparin Indication: ?afib  No Known Allergies  Patient Measurements: Height: 5\' 2"  (157.5 cm) Weight: 185 lb 6.4 oz (84.1 kg) IBW/kg (Calculated) : 50.1 Heparin Dosing Weight: 69.1 kg  Vital Signs: Temp: 98 F (36.7 C) (10/09 2058) Temp Source: Oral (10/09 7591) BP: 121/54 (10/09 6384) Pulse Rate: 59 (10/09 2058)  Labs: Recent Labs    09/30/19 2358 10/01/19 0516 10/01/19 1453 10/01/19 2347  HGB 13.2  --   --   --   HCT 38.7  --   --   --   PLT 319  --   --   --   HEPARINUNFRC  --   --  0.80* 0.65  CREATININE 0.68  --   --   --   TROPONINIHS 37* 55*  --   --     Estimated Creatinine Clearance: 65.8 mL/min (by C-G formula based on SCr of 0.68 mg/dL).   Assessment:  Anticoag: none pta - iv hep for new onset afib (vs SVT). HL 0.8 elevated. Chest CTA negative for PE.  10/10 AM update:  Heparin level therapeutic x 1 after rate decrease   Goal of Therapy:  Heparin level 0.3-0.7 units/ml Monitor platelets by anticoagulation protocol: Yes   Plan:  Cont heparin at 950 units/hr Confirmatory heparin level with AM labs  Narda Bonds, PharmD, [place] Pharmacist Phone: [phone]

## 2019-10-11 MED FILL — ALENDRONATE NA 35 MG TAB: 35 | 28 days supply | Qty: 4 | Fill #3

## 2019-10-14 MED FILL — LISINOPRIL-HCTZ 20-12.5 MG: 20-12.5 | 30 days supply | Qty: 60 | Fill #1

## 2019-10-14 MED FILL — LOVASTATIN 20 MG TABS: 20 | 30 days supply | Qty: 60 | Fill #1

## 2019-10-18 ENCOUNTER — Inpatient Hospital Stay (INDEPENDENT_AMBULATORY_CARE_PROVIDER_SITE_OTHER): Payer: Self-pay | Admitting: Primary Care

## 2019-10-22 ENCOUNTER — Other Ambulatory Visit: Payer: Self-pay

## 2019-10-22 ENCOUNTER — Ambulatory Visit (INDEPENDENT_AMBULATORY_CARE_PROVIDER_SITE_OTHER): Payer: Self-pay | Admitting: Primary Care

## 2019-10-22 ENCOUNTER — Encounter (INDEPENDENT_AMBULATORY_CARE_PROVIDER_SITE_OTHER): Payer: Self-pay | Admitting: Primary Care

## 2019-10-22 VITALS — BP 111/68 | HR 57 | Temp 97.3°F | Ht 62.0 in | Wt 192.0 lb

## 2019-10-22 DIAGNOSIS — Z760 Encounter for issue of repeat prescription: Secondary | ICD-10-CM

## 2019-10-22 DIAGNOSIS — I4891 Unspecified atrial fibrillation: Secondary | ICD-10-CM

## 2019-10-22 DIAGNOSIS — E785 Hyperlipidemia, unspecified: Secondary | ICD-10-CM

## 2019-10-22 DIAGNOSIS — Z09 Encounter for follow-up examination after completed treatment for conditions other than malignant neoplasm: Secondary | ICD-10-CM

## 2019-10-22 MED ORDER — METOPROLOL SUCCINATE ER 25 MG PO TB24: 12.5000 mg | ORAL_TABLET | Freq: Every day | ORAL | 1 refills | Status: DC

## 2019-10-22 MED ORDER — LOVASTATIN 20 MG PO TABS: 40.0000 mg | ORAL_TABLET | Freq: Every day | ORAL | 3 refills | Status: DC

## 2019-10-22 MED ORDER — CALCIUM CARB-CHOLECALCIFEROL 600-800 MG-UNIT PO TABS: 2.0000 | ORAL_TABLET | Freq: Every day | ORAL | 11 refills | Status: DC

## 2019-10-22 MED FILL — METOPROLOL SUCCINATE ER 25: 25 | 30 days supply | Qty: 15 | Fill #0

## 2019-10-22 NOTE — Patient Instructions (Signed)
Fibrilacin auricular Atrial Fibrillation  La fibrilacin auricular es un tipo de latido cardaco irregular o rpido. Si sufre esta afeccin, su corazn late sin ningn orden. Esto dificulta el bombeo de la sangre por parte del corazn de [place] normal. Esta afeccin lo expone a un mayor riesgo de Warehouse managertener un accidente cerebrovascular, insuficiencia cardaca y otros problemas cardacos. La fibrilacin auricular puede comenzar de repente y luego detenerse sola, o puede convertirse en un problema de duracin prolongada. Cules son las causas? Esta afeccin puede ser causada por afecciones cardacas, tales como:  Presin arterial alta.  Insuficiencia cardaca.  Enfermedad de las vlvulas cardacas.  Ciruga cardaca. Algunas otras causas son las siguientes:  Neumona.  Apnea obstructiva del sueo.  Cncer de pulmn.  Enfermedad tiroidea.  Beber alcohol en exceso. Algunas veces, la causa no se conoce. Qu incrementa el riesgo? Es ms probable que usted sufra esta afeccin si:  Fuma.  Es Neomia Dearuna persona de edad avanzada.  Tiene diabetes.  Tiene sobrepeso.  Tiene una historia familiar de este problema.  Hace ejercicio extenuante con frecuencia. Cules son los signos o sntomas? Los sntomas frecuentes de esta afeccin incluyen los siguientes:  Sensacin de que el corazn late rpido.  Dolor en el pecho.  Falta de aire.  Sensacin de debilidad o de desvanecimiento.  Cansarse fcilmente. Siga estas indicaciones en su casa: Medicamentos  Tome los medicamentos de venta libre y los recetados solamente como se lo haya indicado el mdico.  Si su mdico le indica medicamentos anticoagulantes, tmelos exactamente como le indique. El exceso de estos medicamentos puede provocar una hemorragia. La insuficiencia de estos medicamentos no lo protege de los cogulos. Los cogulos pueden causar un accidente cerebrovascular. Estilo de vida      No use productos que contengan  tabaco. Estos incluyen cigarrillos, tabaco para Theatre managermascar y Administrator, Civil Servicecigarrillos electrnicos. Si necesita ayuda para dejar de fumar, consulte al mdico.  No beba alcohol.  No tome bebidas que contengan cafena. Estas bebidas incluyen caf, refrescos y t.  Siga su dieta segn las indicaciones de su mdico.  Haga ejercicios con regularidad tal como se lo indic el mdico. Indicaciones generales  Si tiene una afeccin que hace que se detenga la respiracin por breves perodos (apnea), trtela como le indique su mdico.  Mantenga un peso saludable. No use pldoras para bajar de peso salvo que su mdico le diga que son seguras. Estas pastillas pueden agravar los problemas cardacos.  Concurra a todas las visitas de [address]se[address] como se lo haya indicado el mdico. Esto es importante. Comunquese con un mdico si:  Nota un cambio en la velocidad, el ritmo o la fuerza de los latidos cardacos.  Toma medicamentos anticoagulantes y observa ms moretones.  Se cansa con ms facilidad cuando se mueve o hace ejercicio.  Tiene un cambio repentino Gap Incen el peso. Solicite ayuda de inmediato si:   Siente dolor en el pecho o en la barriga (abdomen).  Tiene dificultad para respirar.  Observa sangre en el vmito, las heces o el pis (orina).  Tiene algn signo de accidente cerebrovascular. "BE FAST" es una manera fcil de recordar las principales seales de advertencia: ? B - Balance (equilibrio). Los signos son mareo, dificultad repentina para caminar o prdida del equilibrio. ? E - Eyes (ojos). Los signos son dificultad para ver o un cambio en la visin. ? F - Face (rostro). Los signos son debilidad repentina o prdida de la sensibilidad en la cara, o que la cara o el prpado se [place] hacia  un lado. ? A - Arms (brazos). Los signos son debilidad o prdida de la sensibilidad en un brazo. Esto sucede de repente y generalmente en un lado del cuerpo. ? S - Speech (habla). Los signos son dificultad repentina para  hablar, hablar arrastrando las palabras o dificultad para comprender lo que la gente dice. ? T - Time (tiempo). Es tiempo de llamar al servicio de Multimedia programmer. Anote la hora en la que comenzaron los sntomas.  Presenta otros signos de un accidente cerebrovascular, como los siguientes: ? Dolor de [place] repentino y muy intenso sin causa aparente. ? Malestar estomacal (nuseas). ? Ganas de devolver (vmitos). ? Movimientos espasmdicos que no puede controlar (convulsiones). Estos sntomas pueden Sales executive. No espere hasta que los sntomas desaparezcan. Solicite atencin mdica de inmediato. Comunquese con el servicio de emergencias de su localidad (911 en los [place]). No conduzca por sus propios medios [employer]. Resumen  La fibrilacin auricular es un tipo de latido cardaco irregular o rpido.  Est expuesto a un riesgo mayor de sufrir esta afeccin si fuma, es una persona de edad avanzada, tiene diabetes o tiene sobrepeso.  Siga las instrucciones de su mdico [employer], la dieta, el ejercicio y las visitas de seguimiento.  Obtenga ayuda de inmediato si tiene signos de un accidente cerebrovascular. Esta informacin no tiene Marine scientist el consejo del mdico. Asegrese de hacerle al mdico cualquier pregunta que tenga. Document Released: 03/07/2009 Document Revised: 03/23/2018 Document Reviewed: 03/23/2018 Elsevier Patient Education  [place] auricular Atrial Fibrillation  La fibrilacin auricular es un tipo de latido cardaco irregular o rpido. Si sufre esta afeccin, su corazn late sin ningn orden. Esto dificulta el bombeo de la sangre por parte del corazn de [place] normal. Esta afeccin lo expone a un mayor riesgo de Best boy un accidente cerebrovascular, insuficiencia cardaca y otros problemas cardacos. La fibrilacin auricular puede comenzar de repente y luego detenerse sola, o puede convertirse en un problema  de duracin prolongada. Cules son las causas? Esta afeccin puede ser causada por afecciones cardacas, tales como:  Presin arterial alta.  Insuficiencia cardaca.  Enfermedad Planada.  Ciruga cardaca. Algunas otras causas son las siguientes:  Neumona.  Apnea obstructiva del sueo.  Cncer de pulmn.  Enfermedad tiroidea.  Beber alcohol en exceso. Algunas veces, la causa no se conoce. Qu incrementa el riesgo? Es ms probable que usted sufra esta afeccin si:  Fuma.  Es Ardelia Mems persona de edad avanzada.  Tiene diabetes.  Tiene sobrepeso.  Tiene una historia familiar de este problema.  Hace ejercicio extenuante con frecuencia. Cules son los signos o sntomas? Los sntomas frecuentes de esta afeccin incluyen los siguientes:  Sensacin de que el corazn late rpido.  Dolor en el pecho.  Falta de aire.  Sensacin de debilidad o de desvanecimiento.  Cansarse fcilmente. Siga estas indicaciones en su casa: Medicamentos  Tome los medicamentos de venta libre y los recetados solamente como se lo haya indicado el mdico.  Si su mdico le indica medicamentos anticoagulantes, tmelos exactamente como le indique. El exceso de estos medicamentos puede provocar una hemorragia. La insuficiencia de estos medicamentos no lo protege de los cogulos. Los cogulos pueden causar un accidente cerebrovascular. Estilo de vida      No use productos que contengan tabaco. Estos incluyen cigarrillos, tabaco para Higher education careers adviser y Psychologist, sport and exercise. Si necesita ayuda para dejar de fumar, consulte al mdico.  No beba alcohol.  No tome bebidas que contengan cafena.  Estas bebidas incluyen caf, refrescos y t.  Siga su dieta segn las indicaciones de su mdico.  Haga ejercicios con regularidad tal como se lo indic el mdico. Indicaciones generales  Si tiene una afeccin que hace que se detenga la respiracin por breves perodos (apnea), trtela como le  indique su mdico.  Mantenga un peso saludable. No use pldoras para bajar de peso salvo que su mdico le diga que son seguras. Estas pastillas pueden agravar los problemas cardacos.  Concurra a todas las visitas de [address] se lo haya indicado el mdico. Esto es importante. Comunquese con un mdico si:  Nota un cambio en la velocidad, el ritmo o la fuerza de los latidos cardacos.  Toma medicamentos anticoagulantes y observa ms moretones.  Se cansa con ms facilidad cuando se mueve o hace ejercicio.  Tiene un cambio repentino [employer]. Solicite ayuda de inmediato si:   Siente dolor en el pecho o en la barriga (abdomen).  Tiene dificultad para respirar.  Observa sangre en el vmito, las heces o el pis (orina).  Tiene algn signo de accidente cerebrovascular. "BE FAST" es una manera fcil de recordar las principales seales de advertencia: ? B - Balance (equilibrio). Los signos son mareo, dificultad repentina para caminar o prdida del equilibrio. ? E - Eyes (ojos). Los signos son dificultad para ver o un cambio en la visin. ? F - Face (rostro). Los signos son debilidad repentina o prdida de la sensibilidad en la cara, o que la cara o el prpado se caigan hacia un lado. ? A - Arms (brazos). Los signos son debilidad o prdida de la sensibilidad en un brazo. Esto sucede de repente y generalmente en un lado del cuerpo. ? S - Speech (habla). Los signos son dificultad repentina para hablar, hablar arrastrando las palabras o dificultad para comprender lo que la gente dice. ? T - Time (tiempo). Es tiempo de llamar al servicio de Sports administrator. Anote la hora en la que comenzaron los sntomas.  Presenta otros signos de un accidente cerebrovascular, como los siguientes: ? Dolor de [place] repentino y muy intenso sin causa aparente. ? Malestar estomacal (nuseas). ? Ganas de devolver (vmitos). ? Movimientos espasmdicos que no puede controlar (convulsiones). Estos sntomas pueden  Customer service manager. No espere hasta que los sntomas desaparezcan. Solicite atencin mdica de inmediato. Comunquese con el servicio de emergencias de su localidad (911 en los [place]). No conduzca por sus propios medios [employer]. Resumen  La fibrilacin auricular es un tipo de latido cardaco irregular o rpido.  Est expuesto a un riesgo mayor de sufrir esta afeccin si fuma, es una persona de edad avanzada, tiene diabetes o tiene sobrepeso.  Siga las instrucciones de su mdico [employer], la dieta, el ejercicio y las visitas de seguimiento.  Obtenga ayuda de inmediato si tiene signos de un accidente cerebrovascular. Esta informacin no tiene Theme park manager el consejo del mdico. Asegrese de hacerle al mdico cualquier pregunta que tenga. Document Released: 03/07/2009 Document Revised: 03/23/2018 Document Reviewed: 03/23/2018 Elsevier Patient Education  2020 [employer].
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
## 2019-10-28 ENCOUNTER — Other Ambulatory Visit: Payer: Self-pay

## 2019-10-28 ENCOUNTER — Ambulatory Visit: Payer: Self-pay | Attending: Family Medicine | Admitting: Pharmacist

## 2019-10-28 DIAGNOSIS — Z7189 Other specified counseling: Secondary | ICD-10-CM

## 2019-10-28 NOTE — Progress Notes (Signed)
    Pharmacy Anticoagulation Clinic  Subjective: Patient presents today for initiation of warfarin.  Anticoagulation indication is unclear.   Pt was referred to my by PCP on 10/22/19.  HPI:  Pt hospitalized from 10/8 to 10/02/19, presenting with palpitations, chest pressure, anxiety. Cardiology consulted; noted SVT but not a fib. Did not recommend chronic anticoagulation but rate management with metoprolol. Patient's discharge summary confirms this. There is no current recommendation for or indication for [place].    Current dose of warfarin: not taking   Objective: Today's INR = not taken   Lab Results  Component Value Date   INR 0.98 12/17/2017   Assessment and Plan: Anticoagulation: Patient is is not currently taking warfarin and should not be based on no dx of afib. Consulted with PCP who agrees. No need to initiate warfarin at this time.    Patient verbalized understanding and was provided with written instructions. Time spent: 15 minutes  Follow-up: with PCP as instructed   Benard Halsted, PharmD, [place] [phone]

## 2019-11-04 MED FILL — ALENDRONATE NA 35 MG TAB: 35 | 28 days supply | Qty: 4 | Fill #4

## 2019-11-15 MED FILL — LISINOPRIL-HCTZ 20-12.5 MG: 20-12.5 | 30 days supply | Qty: 60 | Fill #2

## 2019-11-15 MED FILL — LOVASTATIN 20 MG TABS: 20 | 30 days supply | Qty: 60 | Fill #2

## 2019-12-15 MED FILL — ALENDRONATE NA 35 MG TAB: 35 | 28 days supply | Qty: 4 | Fill #5

## 2019-12-15 MED FILL — LOVASTATIN 20 MG TABS: 20 | 30 days supply | Qty: 60 | Fill #3

## 2019-12-15 MED FILL — LISINOPRIL-HCTZ 20-12.5 MG: 20-12.5 | 30 days supply | Qty: 60 | Fill #3

## 2020-01-07 ENCOUNTER — Other Ambulatory Visit: Payer: Self-pay

## 2020-01-07 ENCOUNTER — Encounter (HOSPITAL_COMMUNITY): Payer: Self-pay

## 2020-01-07 ENCOUNTER — Ambulatory Visit (HOSPITAL_COMMUNITY)
Admission: EM | Admit: 2020-01-07 | Discharge: 2020-01-07 | Disposition: A | Discharge status: Home / Self Care | Payer: Self-pay | Attending: Urgent Care | Admitting: Urgent Care

## 2020-01-07 DIAGNOSIS — I4891 Unspecified atrial fibrillation: Secondary | ICD-10-CM

## 2020-01-07 DIAGNOSIS — I447 Left bundle-branch block, unspecified: Secondary | ICD-10-CM

## 2020-01-07 DIAGNOSIS — R42 Dizziness and giddiness: Secondary | ICD-10-CM

## 2020-01-07 DIAGNOSIS — I10 Essential (primary) hypertension: Secondary | ICD-10-CM

## 2020-01-07 MED ORDER — CETIRIZINE HCL 5 MG PO TABS: 5.0000 mg | ORAL_TABLET | Freq: Every day | ORAL | 0 refills | Status: DC

## 2020-01-07 MED ORDER — MECLIZINE HCL 12.5 MG PO TABS: 12.5000 mg | ORAL_TABLET | Freq: Three times a day (TID) | ORAL | 0 refills | Status: DC | PRN

## 2020-01-07 NOTE — ED Triage Notes (Addendum)
Pt reports she is having headaches x 3 days. Pt reports she feel dizzy every time she try to stand up. Pt states she is having left side back pain.   Pt has an appointment with her PCP on 01/13/2020.

## 2020-01-07 NOTE — ED Provider Notes (Signed)
[place] [facility]   MRN: [id number] DOB: 04-21-1951  Subjective:   [name] [name] is a 69 y.o. female presenting for 3-day history of persistent dizziness, vertigo.  Symptoms are almost exclusively elicited when patient stands from a sitting position and starts to walk or when she turns her head very quickly.  She states that the symptoms last just a few seconds and she is able to walk fully thereafter.  She does admit that she has had some runny stuffy nose, postnasal drainage in her throat.  She does have a history of elevated troponin and chronic left bundle branch block, atrial fibrillation and was seen in the emergency room/hospital September 30, 2019.  She was seen by her cardiologist and was cleared from needing follow-up.  Today, she denies headache, confusion, chest pain, shortness of breath, heart racing, nausea, vomiting, belly pain, hematuria, weakness on one side of the body.  No current facility-administered medications for this encounter.  Current Outpatient Medications:  .  acetaminophen (TYLENOL) 500 MG tablet, Take 1,000 mg by mouth every 6 (six) hours as needed for mild pain or headache., Disp: , Rfl:  .  alendronate (FOSAMAX) 35 MG tablet, Take 1 tablet (35 mg total) by mouth every 7 (seven) days. Take with a full glass of water on an empty stomach. (Patient taking differently: Take 35 mg by mouth every Friday. Take with a full glass of water on an empty stomach.), Disp: 12 tablet, Rfl: 3 .  Calcium Carb-Cholecalciferol (CALTRATE 600+D) 600-800 MG-UNIT TABS, Take 2 tablets by mouth daily., Disp: 60 tablet, Rfl: 11 .  lovastatin (MEVACOR) 20 MG tablet, Take 2 tablets (40 mg total) by mouth at bedtime., Disp: 60 tablet, Rfl: 3 .  metoprolol succinate (TOPROL-XL) 25 MG 24 hr tablet, Take 0.5 tablets (12.5 mg total) by mouth daily., Disp: 60 tablet, Rfl: 1 .  tetrahydrozoline 0.05 % ophthalmic solution, Place 1 drop into both eyes daily as needed (dry eyes)., Disp: , Rfl:     No Known Allergies  Past Medical History:  Diagnosis Date  . Allergy    spring pollen  . Arthritis   . Astigmatism, 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  . Atrial fibrillation ([place])   . Cataract 01/28/2017  . High cholesterol   . History of kidney stones   . 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 Hypertension   . Osteoarthritis of ankle, right    and Subtalar Joint  . Osteoporosis   . Presbyopia of both eyes 01/28/2017  . Pterygium eye, 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     Past Surgical History:  Procedure Laterality Date  . ANKLE FUSION Right 12/24/2017   Tibiocalcaneal fusion  . ANKLE FUSION Right 12/24/2017   Procedure: RIGHT TIBIOCALCANEAL FUSION;  Surgeon: Newt Minion, MD;  Location: [facility];  Service: Orthopedics;  Laterality: Right;  . CHOLECYSTECTOMY      Family History  Family history unknown: Yes    Social History   Tobacco Use  . Smoking status: Never Smoker  . Smokeless tobacco: Never Used  Substance Use Topics  . Alcohol use: No    Alcohol/week: 0.0 standard drinks  . Drug use: No    ROS   Objective:   Vitals: BP 121/67 (BP Location: Right Arm)   Pulse 65   Temp 97.9 F (36.6 C) (Oral)   Resp 18   SpO2 99%   Physical Exam Constitutional:      General: She is not in acute distress.    Appearance: Normal appearance. She is well-developed and normal weight.  She is not ill-appearing, toxic-appearing or diaphoretic.  HENT:     Head: Normocephalic and atraumatic.     Right Ear: External ear normal.     Left Ear: External ear normal.     Nose: Nose normal.     Mouth/Throat:     Mouth: Mucous membranes are moist.     Pharynx: Oropharynx is clear.  Eyes:     General: No scleral icterus.    Extraocular Movements: Extraocular movements intact.     Pupils: Pupils are equal, round, and reactive to light.  Cardiovascular:     Rate and Rhythm: Normal rate and regular rhythm.     Pulses: Normal pulses.     Heart sounds: Normal heart sounds. No murmur. No friction rub. No  gallop.   Pulmonary:     Effort: Pulmonary effort is normal. No respiratory distress.     Breath sounds: Normal breath sounds. No stridor. No wheezing, rhonchi or rales.  Abdominal:     General: Bowel sounds are normal. There is no distension.     Palpations: Abdomen is soft. There is no mass.     Tenderness: There is no abdominal tenderness. There is no right CVA tenderness, left CVA tenderness, guarding or rebound.  Skin:    General: Skin is warm and dry.     Coloration: Skin is not pale.     Findings: No rash.  Neurological:     General: No focal deficit present.     Mental Status: She is alert and oriented to person, place, and time.     Cranial Nerves: No cranial nerve deficit or facial asymmetry.     Motor: No weakness.     Coordination: Romberg sign negative.     Gait: Gait normal.  Psychiatric:        Mood and Affect: Mood normal. Mood is not anxious or depressed.        Behavior: Behavior normal.        Thought Content: Thought content normal.        Judgment: Judgment normal.     ED ECG REPORT   Date: 01/07/2020  Rate: 64bpm  Rhythm: normal sinus rhythm  QRS Axis: left  Intervals: normal  ST/T Wave abnormalities: nonspecific ST changes  Conduction Disutrbances:left bundle branch block  Narrative Interpretation: Chronic left bundle branch block, sinus rhythm at 64 bpm.  Today's EKG is nearly identical to the 1 from 09/30/2019.  Old EKG Reviewed: unchanged  I have personally reviewed the EKG tracing and agree with the computerized printout as noted.   Assessment and Plan :   1. Vertigo   2. Dizziness   3. Atrial fibrillation, unspecified type (HCC)   4. Left bundle branch block   5. Essential hypertension     I do not suspect ACS, stroke given no EKG changes and lack of chest pain or headache.  Will manage for vertigo, dizziness with meclizine that may be secondary to her new onset sinus congestion.  Patient is to use Zyrtec at 5 mg for this.  Patient has  social distance very strictly and does not want a COVID-19 test.  Counseled patient on potential for adverse effects with medications prescribed/recommended today, strict ER and return-to-clinic precautions discussed, patient verbalized understanding.    Wallis Bamberg, PA-C 01/08/20 1139

## 2020-01-07 NOTE — Discharge Instructions (Addendum)
Tome 500mg-650mg de Tylenol cada 6 horas con comida para dolor y inflammacion. ° ° ° °

## 2020-01-10 ENCOUNTER — Other Ambulatory Visit: Payer: Self-pay

## 2020-01-10 ENCOUNTER — Encounter (INDEPENDENT_AMBULATORY_CARE_PROVIDER_SITE_OTHER): Payer: Self-pay | Admitting: Primary Care

## 2020-01-10 ENCOUNTER — Ambulatory Visit (INDEPENDENT_AMBULATORY_CARE_PROVIDER_SITE_OTHER): Payer: Self-pay | Admitting: Primary Care

## 2020-01-10 DIAGNOSIS — I10 Essential (primary) hypertension: Secondary | ICD-10-CM

## 2020-01-10 DIAGNOSIS — R42 Dizziness and giddiness: Secondary | ICD-10-CM

## 2020-01-10 DIAGNOSIS — R5383 Other fatigue: Secondary | ICD-10-CM

## 2020-01-10 DIAGNOSIS — Z09 Encounter for follow-up examination after completed treatment for conditions other than malignant neoplasm: Secondary | ICD-10-CM

## 2020-01-10 DIAGNOSIS — I482 Chronic atrial fibrillation, unspecified: Secondary | ICD-10-CM

## 2020-01-10 DIAGNOSIS — E785 Hyperlipidemia, unspecified: Secondary | ICD-10-CM

## 2020-01-10 MED ORDER — MECLIZINE HCL 12.5 MG PO TABS: 12.5000 mg | ORAL_TABLET | Freq: Three times a day (TID) | ORAL | 1 refills | Status: DC | PRN

## 2020-01-10 MED ORDER — LOVASTATIN 20 MG PO TABS: 40.0000 mg | ORAL_TABLET | Freq: Every day | ORAL | 3 refills | Status: DC

## 2020-01-10 MED ORDER — METOPROLOL SUCCINATE ER 25 MG PO TB24: 12.5000 mg | ORAL_TABLET | Freq: Every day | ORAL | 1 refills | Status: DC

## 2020-01-10 MED FILL — LOVASTATIN 20 MG TABS: 20 | 30 days supply | Qty: 60 | Fill #0

## 2020-01-10 MED FILL — ALENDRONATE NA 35 MG TAB: 35 | 28 days supply | Qty: 4 | Fill #6

## 2020-01-10 MED FILL — METOPROLOL SUCCINATE ER 25: 25 | 30 days supply | Qty: 15 | Fill #0

## 2020-01-10 NOTE — Progress Notes (Signed)
Virtual Visit via Telephone Note  I connected with [name] [name] on 01/10/20 at 10:50 AM EST by telephone and verified that I am speaking with the correct person using two identifiers.   I discussed the limitations, risks, security and privacy concerns of performing an evaluation and management service by telephone and the availability of in person appointments. I also discussed with the patient that there may be a patient responsible charge related to this service. The patient expressed understanding and agreed to proceed.   History of Present Illness: [name] [name] is having a tele visit for hospital follow up and medication refills. She presented to the emergency room with  3-day history of persistent dizziness, vertigo.  Symptoms are present with change in movement.  She also admitted to runny stuffy nose, postnasal drainage in her throat  Past Medical History:  Diagnosis Date  . Allergy    spring pollen  . Arthritis   . Astigmatism, 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  . Atrial fibrillation ([place])   . Cataract 01/28/2017  . High cholesterol   . History of kidney stones   . 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 Hypertension   . Osteoarthritis of ankle, right    and Subtalar Joint  . Osteoporosis   . Presbyopia of both eyes 01/28/2017  . Pterygium eye, 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   Current Outpatient Medications on File Prior to Visit  Medication Sig Dispense Refill  . alendronate (FOSAMAX) 35 MG tablet Take 1 tablet (35 mg total) by mouth every 7 (seven) days. Take with a full glass of water on an empty stomach. (Patient taking differently: Take 35 mg by mouth every Friday. Take with a full glass of water on an empty stomach.) 12 tablet 3  . Calcium Carb-Cholecalciferol (CALTRATE 600+D) 600-800 MG-UNIT TABS Take 2 tablets by mouth daily. 60 tablet 11  . cetirizine (ZYRTEC) 5 MG tablet Take 1 tablet (5 mg total) by mouth daily. 30 tablet 0  . acetaminophen (TYLENOL) 500 MG tablet Take 1,000 mg by  mouth every 6 (six) hours as needed for mild pain or headache.    . tetrahydrozoline 0.05 % ophthalmic solution Place 1 drop into both eyes daily as needed (dry eyes).     No current facility-administered medications on file prior to visit.   Observations/Objective: Review of Systems  HENT: Positive for congestion.   Neurological: Positive for dizziness and headaches.  All other systems reviewed and are negative.  Assessment and Plan: [name] [name] was seen today for dizziness.  Diagnoses and all orders for this visit:  Chronic atrial fibrillation (HCC) Beta blocker is used for  rapid uncoordinated atrial electrical activity and an irregular irregularly ventricular response. Risk factors to include age hypertension obesity and  genetics is rare. -     metoprolol succinate (TOPROL-XL) 25 MG 24 hr tablet; Take 0.5 tablets (12.5 mg total) by mouth daily.  Hyperlipidemia, unspecified hyperlipidemia type Elevated cholesterol can lead to heart attack and stroke. Recommend decreasing your fatty foods, red meat, cheese, milk and increase fiber like whole grains and veggies. Continue statin lovastatin 70m at bedtime re-evaluate dosage after blood work -     lovastatin (MEVACOR) 20 MG tablet; Take 2 tablets (40 mg total) by mouth at bedtime. -     Lipid Panel  Essential hypertension Blood pressure goal of less than 130/80, low-sodium, DASH diet, medication compliance, 150 minutes of moderate intensity exercise per week. Discussed medication compliance, adverse effects. -     CBC with Differential -  CMP14+EGFR; Future  Fatigue, unspecified type With dizziness increase fluids avoid dehydration, do not change position quickly. Refill meclizine 12.5 for dizzy spells, rest if no improvement call and schedule appointment for blood work, cbc with diff, tsh/ T4 and check Bp  Other orders -     meclizine (ANTIVERT) 12.5 MG tablet; Take 1 tablet (12.5 mg total) by mouth 3 (three) times daily as needed  for dizziness.    Follow Up Instructions:    I discussed the assessment and treatment plan with the patient. The patient was provided an opportunity to ask questions and all were answered. The patient agreed with the plan and demonstrated an understanding of the instructions.   The patient was advised to call back or seek an in-person evaluation if the symptoms worsen or if the condition fails to improve as anticipated.  I provided 15 minutes of non-face-to-face time during this encounter.   Kerin Perna, NP

## 2020-01-11 ENCOUNTER — Other Ambulatory Visit (INDEPENDENT_AMBULATORY_CARE_PROVIDER_SITE_OTHER): Payer: Self-pay

## 2020-01-11 DIAGNOSIS — I10 Essential (primary) hypertension: Secondary | ICD-10-CM

## 2020-01-12 ENCOUNTER — Other Ambulatory Visit (INDEPENDENT_AMBULATORY_CARE_PROVIDER_SITE_OTHER): Payer: Self-pay | Admitting: Primary Care

## 2020-01-12 DIAGNOSIS — I10 Essential (primary) hypertension: Secondary | ICD-10-CM

## 2020-01-12 LAB — CMP14+EGFR
ALT: 17 IU/L (ref 0–32)
AST: 21 IU/L (ref 0–40)
Albumin/Globulin Ratio: 1.6 (ref 1.2–2.2)
Albumin: 4.3 g/dL (ref 3.8–4.8)
Alkaline Phosphatase: 83 IU/L (ref 39–117)
BUN/Creatinine Ratio: 21 (ref 12–28)
BUN: 13 mg/dL (ref 8–27)
Bilirubin Total: 0.5 mg/dL (ref 0.0–1.2)
CO2: 25 mmol/L (ref 20–29)
Calcium: 10 mg/dL (ref 8.7–10.3)
Chloride: 105 mmol/L (ref 96–106)
Creatinine, Ser: 0.63 mg/dL (ref 0.57–1.00)
GFR calc Af Amer: 105 mL/min/{1.73_m2} (ref >59)
GFR calc non Af Amer: 91 mL/min/{1.73_m2} (ref >59)
Globulin, Total: 2.7 g/dL (ref 1.5–4.5)
Glucose: 94 mg/dL (ref 65–99)
Potassium: 4 mmol/L (ref 3.5–5.2)
Sodium: 143 mmol/L (ref 134–144)
Total Protein: 7 g/dL (ref 6.0–8.5)

## 2020-01-12 NOTE — Telephone Encounter (Signed)
FWD to PCP

## 2020-01-13 ENCOUNTER — Ambulatory Visit (INDEPENDENT_AMBULATORY_CARE_PROVIDER_SITE_OTHER): Payer: Self-pay | Admitting: Primary Care

## 2020-01-13 NOTE — Telephone Encounter (Signed)
FWD to PCP

## 2020-01-13 NOTE — Telephone Encounter (Signed)
Patient called to make a medication refill for  lisinopril-hydrochlorothiazide (ZESTORETIC) 20-12.5 MG per tablet 2 tablet      Patient uses [facility]   Please advice [phone]

## 2020-01-17 ENCOUNTER — Other Ambulatory Visit (INDEPENDENT_AMBULATORY_CARE_PROVIDER_SITE_OTHER): Payer: Self-pay | Admitting: Primary Care

## 2020-01-17 DIAGNOSIS — I10 Essential (primary) hypertension: Secondary | ICD-10-CM

## 2020-01-19 NOTE — Telephone Encounter (Signed)
Patient called to know the status of her medication lisinopril-hydrochlorothiazide (ZESTORETIC) 20-12.5 MG per tablet 2 tablet status she has been waiting on the refill since Jan 20.  Patient use [facility]  Please advice [phone]

## 2020-01-20 NOTE — Telephone Encounter (Signed)
Sent to PCP. It looks like medication was sent but also refused. Please clarify.

## 2020-01-21 ENCOUNTER — Ambulatory Visit (INDEPENDENT_AMBULATORY_CARE_PROVIDER_SITE_OTHER): Payer: Self-pay | Admitting: Primary Care

## 2020-01-25 MED FILL — LISINOPRIL-HCTZ 20-12.5 MG: 20-12.5 | 30 days supply | Qty: 60 | Fill #0

## 2020-01-27 ENCOUNTER — Other Ambulatory Visit (INDEPENDENT_AMBULATORY_CARE_PROVIDER_SITE_OTHER): Payer: Self-pay

## 2020-01-27 ENCOUNTER — Other Ambulatory Visit: Payer: Self-pay

## 2020-01-28 ENCOUNTER — Other Ambulatory Visit (INDEPENDENT_AMBULATORY_CARE_PROVIDER_SITE_OTHER): Payer: Self-pay | Admitting: Primary Care

## 2020-01-28 DIAGNOSIS — E785 Hyperlipidemia, unspecified: Secondary | ICD-10-CM

## 2020-01-28 LAB — CBC WITH DIFFERENTIAL/PLATELET
Basophils Absolute: 0 10*3/uL (ref 0.0–0.2)
Basos: 1 %
EOS (ABSOLUTE): 0.3 10*3/uL (ref 0.0–0.4)
Eos: 6 %
Hematocrit: 34.8 % (ref 34.0–46.6)
Hemoglobin: 12.1 g/dL (ref 11.1–15.9)
Immature Grans (Abs): 0 10*3/uL (ref 0.0–0.1)
Immature Granulocytes: 0 %
Lymphocytes Absolute: 1.4 10*3/uL (ref 0.7–3.1)
Lymphs: 26 %
MCH: 31.7 pg (ref 26.6–33.0)
MCHC: 34.8 g/dL (ref 31.5–35.7)
MCV: 91 fL (ref 79–97)
Monocytes Absolute: 0.5 10*3/uL (ref 0.1–0.9)
Monocytes: 9 %
Neutrophils Absolute: 3.1 10*3/uL (ref 1.4–7.0)
Neutrophils: 58 %
Platelets: 345 10*3/uL (ref 150–450)
RBC: 3.82 x10E6/uL (ref 3.77–5.28)
RDW: 12.5 % (ref 11.7–15.4)
WBC: 5.4 10*3/uL (ref 3.4–10.8)

## 2020-01-28 LAB — LIPID PANEL
Chol/HDL Ratio: 2.9 ratio (ref 0.0–4.4)
Cholesterol, Total: 186 mg/dL (ref 100–199)
HDL: 64 mg/dL (ref >39)
LDL Chol Calc (NIH): 101 mg/dL — ABNORMAL HIGH (ref 0–99)
Triglycerides: 121 mg/dL (ref 0–149)
VLDL Cholesterol Cal: 21 mg/dL (ref 5–40)

## 2020-01-28 MED ORDER — LOVASTATIN 20 MG PO TABS: 20.0000 mg | ORAL_TABLET | Freq: Every day | ORAL | 5 refills | Status: DC

## 2020-01-28 MED FILL — LOVASTATIN 20 MG TABS: 20 | 30 days supply | Qty: 30 | Fill #0

## 2020-02-04 ENCOUNTER — Telehealth (INDEPENDENT_AMBULATORY_CARE_PROVIDER_SITE_OTHER): Payer: Self-pay

## 2020-02-04 NOTE — Telephone Encounter (Signed)
Call placed using pacific interpreter [phone]) patient is aware that Labs are normal. Will continue to monitor.Your LDL is elevated only by a few points . Work on eating a low fat, heart healthy diet and participate in regular aerobic exercise program to control as well. Exercise at least 30 minutes per day-5 days per week. Avoid red meat. No fried foods. No junk foods, sodas, sugary foods or drinks, unhealthy snacking, alcohol or smoking. She can continue her lovastatin but only take 1 (20mg ) at bed time. She verbalized understanding. , CMA

## 2020-02-04 NOTE — Telephone Encounter (Signed)
-----   Message from Grayce Sessions, NP sent at 01/28/2020 11:55 AM EST ----- Labs are  normal. Will continue to monitor.Your LDL is elevated only by a few points . Work on eating a low fat, heart healthy diet and participate in regular aerobic exercise program to control as well. Exercise at least  30 minutes per day-5 days per week. Avoid red meat. No fried foods. No junk foods, sodas, sugary foods or drinks, unhealthy snacking, alcohol or smoking. She can continue her lovastatin but only take 1 (20mg ) at bed time.

## 2020-02-08 MED FILL — LOVASTATIN 20 MG TABS: 20 | 30 days supply | Qty: 30 | Fill #0

## 2020-02-08 MED FILL — ALENDRONATE NA 35 MG TAB: 35 | 28 days supply | Qty: 4 | Fill #7

## 2020-02-29 MED FILL — LISINOPRIL-HCTZ 20-12.5 MG: 20-12.5 | 30 days supply | Qty: 60 | Fill #1

## 2020-03-16 MED FILL — ALENDRONATE NA 35 MG TAB: 35 | 28 days supply | Qty: 4 | Fill #8

## 2020-03-16 MED FILL — LOVASTATIN 20 MG TABS: 20 | 30 days supply | Qty: 30 | Fill #1

## 2020-03-20 MED FILL — LOVASTATIN 20 MG TABS: 20 | 30 days supply | Qty: 60 | Fill #1

## 2020-03-28 ENCOUNTER — Ambulatory Visit: Payer: Self-pay

## 2020-03-30 ENCOUNTER — Other Ambulatory Visit: Payer: Self-pay

## 2020-03-30 ENCOUNTER — Ambulatory Visit: Payer: Self-pay | Attending: Primary Care

## 2020-03-30 MED FILL — LISINOPRIL-HCTZ 20-12.5 MG: 20-12.5 | 30 days supply | Qty: 60 | Fill #2

## 2020-04-20 MED FILL — ALENDRONATE NA 35 MG TAB: 35 | 28 days supply | Qty: 4 | Fill #9

## 2020-05-01 ENCOUNTER — Telehealth (INDEPENDENT_AMBULATORY_CARE_PROVIDER_SITE_OTHER): Payer: Self-pay | Admitting: Primary Care

## 2020-05-01 ENCOUNTER — Other Ambulatory Visit: Payer: Self-pay

## 2020-05-01 DIAGNOSIS — I10 Essential (primary) hypertension: Secondary | ICD-10-CM

## 2020-05-01 DIAGNOSIS — K0889 Other specified disorders of teeth and supporting structures: Secondary | ICD-10-CM

## 2020-05-01 DIAGNOSIS — Z760 Encounter for issue of repeat prescription: Secondary | ICD-10-CM

## 2020-05-01 NOTE — Progress Notes (Signed)
Virtual Visit via Telephone Note  I connected with [name] [name] on 05/01/20 at  1:50 PM EDT by telephone and verified that I am speaking with the correct person using two identifiers.   I discussed the limitations, risks, security and privacy concerns of performing an evaluation and management service by telephone and the availability of in person appointments. I also discussed with the patient that there may be a patient responsible charge related to this service. The patient expressed understanding and agreed to proceed.   History of Present Illness: [name] is a 69 year old Hispanic female interpretor used. She is complaining  of left side of her face hurting and teeth are sensitive to temperature change- Nurse provided a number to call for an appointment for a dentist Past Medical History:  Diagnosis Date  . Allergy    spring pollen  . Arthritis   . Astigmatism, 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  . Atrial fibrillation (HCC)   . Cataract 01/28/2017  . High cholesterol   . History of kidney stones   . 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 Hypertension   . Osteoarthritis of ankle, right    and Subtalar Joint  . Osteoporosis   . Presbyopia of both eyes 01/28/2017  . Pterygium eye, 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     Current Outpatient Medications on File Prior to Visit  Medication Sig Dispense Refill  . acetaminophen (TYLENOL) 500 MG tablet Take 1,000 mg by mouth every 6 (six) hours as needed for mild pain or headache.    . lovastatin (MEVACOR) 20 MG tablet Take 1 tablet (20 mg total) by mouth at bedtime. 30 tablet 5  . metoprolol succinate (TOPROL-XL) 25 MG 24 hr tablet Take 0.5 tablets (12.5 mg total) by mouth daily. (Patient not taking: Reported on 05/01/2020) 180 tablet 1   No current facility-administered medications on file prior to visit.   Observations/Objective: Review of Systems  HENT:       Tooth pain  All other systems reviewed and are negative.   Assessment and  Plan: Jeryl was seen today for follow-up.  Diagnoses and all orders for this visit:  Medication refill Lisinopril/HCTZ 20/25mg  take one tablet daily   Pain, dental Nurse provided [facility]- [phone]  Essential hypertension Counseled on blood pressure goal of less than 130/80, low-sodium, DASH diet, medication compliance, 150 minutes of moderate intensity exercise per week. Discussed medication compliance, adverse effects.   Follow Up Instructions:    I discussed the assessment and treatment plan with the patient. The patient was provided an opportunity to ask questions and all were answered. The patient agreed with the plan and demonstrated an understanding of the instructions.   The patient was advised to call back or seek an in-person evaluation if the symptoms worsen or if the condition fails to improve as anticipated.  I provided 12 minutes of non-face-to-face time during this encounter.   Grayce Sessions, NP

## 2020-05-01 NOTE — Progress Notes (Signed)
Feels ok. Has no concerns Would like to get refill on medication Would like referral to dentist. Informed that we do not have dentist we usually refer to.  Nurse gave her phone number to [facility]- [phone]  Does not take Metoprolol. Did not know it was prescribed.

## 2020-05-08 MED FILL — LISINOPRIL-HCTZ 20-12.5 MG: 20-12.5 | 30 days supply | Qty: 60 | Fill #3

## 2020-05-13 MED ORDER — LISINOPRIL-HYDROCHLOROTHIAZIDE 20-25 MG PO TABS: 1.0000 | ORAL_TABLET | Freq: Every day | ORAL | 3 refills | Status: DC

## 2020-05-15 MED FILL — ?METOPROLOL SUCC ER 25MG TA: 25 | 30 days supply | Qty: 15 | Fill #1

## 2020-05-15 MED FILL — LISINOPRIL-HYDROCHLOROTHIAZ: 20-25 | 30 days supply | Qty: 30 | Fill #0

## 2020-05-15 MED FILL — ALENDRONATE NA 35 MG TAB: 35 | 28 days supply | Qty: 4 | Fill #10

## 2020-06-12 MED FILL — LOVASTATIN 20 MG TABS: 20 | 30 days supply | Qty: 60 | Fill #2

## 2020-06-21 ENCOUNTER — Telehealth (INDEPENDENT_AMBULATORY_CARE_PROVIDER_SITE_OTHER): Payer: Self-pay

## 2020-06-21 ENCOUNTER — Other Ambulatory Visit (INDEPENDENT_AMBULATORY_CARE_PROVIDER_SITE_OTHER): Payer: Self-pay | Admitting: Primary Care

## 2020-06-21 MED FILL — ?METOPROLOL SUCC ER 25MG TA: 25 | 30 days supply | Qty: 15 | Fill #2

## 2020-06-21 NOTE — Telephone Encounter (Signed)
Patient called to make a medication refill for   alendronate (FOSAMAX) 35 MG tablet   Patient uses  [facility]  Please advice [phone]

## 2020-06-22 NOTE — Telephone Encounter (Signed)
Yes denied

## 2020-06-22 NOTE — Telephone Encounter (Signed)
Does patient need an appointment? Prescription last prescribed in 2020.

## 2020-06-23 NOTE — Telephone Encounter (Signed)
Called patient using pacific interpreter Christopher([phone]). Per DPR patients friend Debarah Crape is aware that patient needs to schedule an appointment with PCP to discuss need for alendronate. She will have patient call office to schedule.

## 2020-06-30 ENCOUNTER — Other Ambulatory Visit: Payer: Self-pay

## 2020-06-30 ENCOUNTER — Ambulatory Visit (INDEPENDENT_AMBULATORY_CARE_PROVIDER_SITE_OTHER): Payer: Self-pay | Admitting: Primary Care

## 2020-06-30 ENCOUNTER — Encounter (INDEPENDENT_AMBULATORY_CARE_PROVIDER_SITE_OTHER): Payer: Self-pay | Admitting: Primary Care

## 2020-06-30 ENCOUNTER — Other Ambulatory Visit: Payer: Self-pay | Admitting: Primary Care

## 2020-06-30 VITALS — BP 145/71 | HR 67 | Temp 98.5°F | Ht 62.0 in | Wt 194.0 lb

## 2020-06-30 DIAGNOSIS — I4891 Unspecified atrial fibrillation: Secondary | ICD-10-CM

## 2020-06-30 DIAGNOSIS — E785 Hyperlipidemia, unspecified: Secondary | ICD-10-CM

## 2020-06-30 DIAGNOSIS — I10 Essential (primary) hypertension: Secondary | ICD-10-CM

## 2020-06-30 DIAGNOSIS — I482 Chronic atrial fibrillation, unspecified: Secondary | ICD-10-CM

## 2020-06-30 DIAGNOSIS — Z760 Encounter for issue of repeat prescription: Secondary | ICD-10-CM

## 2020-06-30 DIAGNOSIS — M8589 Other specified disorders of bone density and structure, multiple sites: Secondary | ICD-10-CM

## 2020-06-30 MED ORDER — ALENDRONATE SODIUM 70 MG PO TABS: 70.0000 mg | ORAL_TABLET | ORAL | 3 refills | Status: DC

## 2020-06-30 MED ORDER — METOPROLOL SUCCINATE ER 25 MG PO TB24: 12.5000 mg | ORAL_TABLET | Freq: Every day | ORAL | 1 refills | Status: DC

## 2020-06-30 MED ORDER — LISINOPRIL-HYDROCHLOROTHIAZIDE 20-25 MG PO TABS: 1.0000 | ORAL_TABLET | Freq: Every day | ORAL | 3 refills | Status: DC

## 2020-06-30 MED FILL — ALENDRONATE NA 70 MG TAB: 70 | 28 days supply | Qty: 4 | Fill #0

## 2020-06-30 MED FILL — LISINOPRIL-HYDROCHLOROTHIAZ: 20-25 | 30 days supply | Qty: 30 | Fill #0
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
## 2020-06-30 NOTE — Patient Instructions (Signed)
Fibrilacin auricular Atrial Fibrillation  La fibrilacin auricular es un tipo de latido cardaco irregular o rpido. Si sufre esta afeccin, su corazn late sin ningn orden. Esto dificulta el bombeo de la sangre por parte del corazn de [place] normal. La fibrilacin auricular puede aparecer y Armed forces operational officer, o puede convertirse en un problema prolongado. Si esta afeccin no se trata, puede aumentar el riesgo de accidente cerebrovascular, insuficiencia cardaca y otros problemas del corazn. Cules son las causas? Esta afeccin ser causada por enfermedades que daan el corazn. Incluyen las siguientes:  Presin arterial alta.  Insuficiencia cardaca.  Enfermedad [place].  Ciruga cardaca. Otras causas son las siguientes:  Diabetes.  Enfermedad tiroidea.  Tener sobrepeso.  Enfermedad renal. Algunas veces, la causa no se conoce. Qu incrementa el riesgo? Es ms probable que sufra esta afeccin si:  Es Ardelia Mems persona de edad avanzada.  Fuma.  Hace ejercicio muy extenuante con frecuencia.  Tiene antecedentes familiares de esta afeccin.  Es hombre.  Consume drogas.  Bebe mucho alcohol.  Tiene afecciones pulmonares, como enfisema, neumona o EPOC.  Tiene apnea del sueo. Cules son los signos o sntomas? Los sntomas frecuentes de esta afeccin [employer] siguientes:  Sensacin de que el corazn late [place] rpido.  Dolor o Adult nurse.  Falta de aire.  Sensacin repentina de debilidad o de desvanecimiento.  Cansarse con facilidad al hacer actividad fsica.  Desmayos.  Sudoracin. En algunos casos, no hay sntomas. Cmo se trata? El tratamiento depende de las afecciones subyacentes y de cmo se siente cuando tiene fibrilacin auricular. Incluyen las siguientes:  Medicamentos para: ? Prevenir los cogulos de [place]. ? Tratar los problemas de frecuencia cardaca o de ritmo cardaco.  Usar dispositivos, por ejemplo, un  marcapasos, para corregir problemas del ritmo cardaco.  Sherrye Payor ciruga para extirpar la parte del corazn que enva seales incorrectas.  Cerrar una zona donde se pueden formar cogulos en el corazn (orejuela auricular izquierda). En algunos casos, el mdico tratar otras afecciones subyacentes. Siga estas instrucciones en su casa: Medicamentos  Use los medicamentos de venta libre y los recetados solamente como se lo haya indicado el mdico.  No tome ningn medicamento nuevo sin hablar primero con su mdico.  Si est tomando anticoagulantes, tenga en cuenta lo siguiente: ? Hable con el mdico antes de tomar cualquier medicamento que contenga aspirina o antiinflamatorios no esteroideos (AINE), como el ibuprofeno. ? Tome los medicamentos exactamente como le indic el mdico. Tmelos a la [employer]. ? Evite las actividades que podran causarle lesiones o moretones. Siga las instrucciones acerca de cmo evitar las cadas. ? Use un brazalete que indique que est tomando anticoagulantes. O lleve consigo una tarjeta que [employer] medicamentos que est tomando. Estilo de vida      No consuma ningn producto que contenga nicotina o tabaco. Estos incluyen cigarrillos, cigarrillos electrnicos y tabaco para Higher education careers adviser. Si necesita ayuda para dejar de fumar, consulte al mdico.  Consuma alimentos cardiosaludables. Hable con su mdico sobre el plan de alimentacin adecuado para usted.  Haga ejercicios con regularidad tal como se lo indic el mdico.  No beba alcohol.  Baje de peso si es necesario.  No consuma drogas, ni siquiera cannabis. Instrucciones generales  Si tiene una afeccin que hace que se detenga la respiracin por breves perodos (apnea), trtela como le indique su mdico.  Mantenga un peso saludable. No use pldoras para bajar de peso salvo que su mdico le diga que son seguras. Estas  pastillas pueden agravar los problemas cardacos.  Concurra a todas  las visitas de seguimiento como se lo haya indicado el mdico. Esto es importante. Comunquese con un mdico si:  Nota un cambio en la velocidad, el ritmo o la fuerza de los latidos cardacos.  Toma medicamentos anticoagulantes y tiene ms moretones.  Se cansa con ms facilidad cuando se mueve o hace ejercicio.  Tiene un cambio repentino en el peso. Solicite ayuda de inmediato si:   Siente dolor en el pecho o en la barriga (abdomen).  Tiene dificultad para respirar.  Tiene efectos secundarios de los anticoagulantes, como sangre en el vmito, la materia fecal (heces) o el pis (orina), o sangrado que no puede detenerse.  Tiene algn signo de accidente cerebrovascular. "BE FAST" es una manera fcil de recordar las principales seales de advertencia: ? B - Balance (equilibrio). Los signos son mareos, dificultad repentina para caminar o prdida del equilibrio. ? E - Eyes (ojos). Los signos son dificultad para ver o un cambio en la visin. ? F - Face (rostro). Los signos son debilidad repentina o prdida de la sensibilidad en la cara, o que la cara o el prpado se caigan hacia un lado. ? A - Arms (brazos). Los signos son debilidad o prdida de la sensibilidad en un brazo. Esto sucede de repente y generalmente en un lado del cuerpo. ? S - Speech (habla). Los signos son dificultad para hablar, hablar arrastrando las palabras o dificultad para comprender lo que la gente dice. ? T - Time (tiempo). Es tiempo de llamar al servicio de emergencias. Anote la hora a la que comenzaron los sntomas.  Presenta otros signos de un accidente cerebrovascular, como los siguientes: ? Dolor de cabeza repentino y muy intenso sin causa aparente. ? Ganas de vomitar (nuseas). ? Vmitos. ? Una convulsin. Estos sntomas pueden indicar una emergencia. No espere a ver si los sntomas desaparecen. Solicite atencin mdica de inmediato. Comunquese con el servicio de emergencias de su localidad (911 en los [place]). No conduzca por sus propios medios hasta el hospital. Resumen  La fibrilacin auricular es un tipo de latido cardaco irregular o rpido.  Est expuesto a un riesgo mayor de sufrir esta afeccin si fuma, es una persona de edad avanzada, tiene diabetes o tiene sobrepeso.  Siga las instrucciones de su mdico sobre los medicamentos, la dieta, el ejercicio y las visitas de seguimiento.  Obtenga ayuda de inmediato si tiene signos o sntomas de un accidente cerebrovascular.  Busque ayuda de inmediato si no puede respirar o siente dolor o malestar en el pecho. Esta informacin no tiene como fin reemplazar el consejo del mdico. Asegrese de hacerle al mdico cualquier pregunta que tenga. Document Revised: 07/27/2019 Document Reviewed: 07/27/2019 Elsevier Patient Education  2020 Elsevier Inc.  

## 2020-07-26 MED FILL — METOPROLOL SUCCINATE ER 25: 25 | 30 days supply | Qty: 15 | Fill #3

## 2020-07-26 MED FILL — ALENDRONATE NA 70 MG TAB: 70 | 28 days supply | Qty: 4 | Fill #1

## 2020-08-15 MED FILL — LISINOPRIL-HYDROCHLOROTHIAZ: 20-25 | 30 days supply | Qty: 30 | Fill #1

## 2020-08-15 MED FILL — LOVASTATIN 20 MG TABS: 20 | 30 days supply | Qty: 60 | Fill #3

## 2020-09-05 MED FILL — METOPROLOL SUCCINATE ER 25: 25 | 30 days supply | Qty: 15 | Fill #4

## 2020-09-05 MED FILL — ALENDRONATE NA 70 MG TAB: 70 | 28 days supply | Qty: 4 | Fill #2

## 2020-10-06 MED FILL — LISINOPRIL-HYDROCHLOROTHIAZ: 20-25 | 30 days supply | Qty: 30 | Fill #2

## 2020-10-06 MED FILL — LOVASTATIN 20 MG TABS: 20 | 30 days supply | Qty: 30 | Fill #2

## 2020-10-06 MED FILL — ALENDRONATE NA 70 MG TAB: 70 | 28 days supply | Qty: 4 | Fill #3

## 2020-10-06 MED FILL — METOPROLOL SUCCINATE ER 25: 25 | 30 days supply | Qty: 15 | Fill #5

## 2020-11-10 ENCOUNTER — Other Ambulatory Visit (INDEPENDENT_AMBULATORY_CARE_PROVIDER_SITE_OTHER): Payer: Self-pay | Admitting: Primary Care

## 2020-11-10 DIAGNOSIS — M8589 Other specified disorders of bone density and structure, multiple sites: Secondary | ICD-10-CM

## 2020-11-10 NOTE — Telephone Encounter (Signed)
Sent to PCP ?

## 2020-12-01 ENCOUNTER — Other Ambulatory Visit (INDEPENDENT_AMBULATORY_CARE_PROVIDER_SITE_OTHER): Payer: Self-pay | Admitting: Primary Care

## 2020-12-01 ENCOUNTER — Other Ambulatory Visit: Payer: Self-pay | Admitting: Primary Care

## 2020-12-01 DIAGNOSIS — E785 Hyperlipidemia, unspecified: Secondary | ICD-10-CM

## 2020-12-01 DIAGNOSIS — I10 Essential (primary) hypertension: Secondary | ICD-10-CM

## 2020-12-01 DIAGNOSIS — I482 Chronic atrial fibrillation, unspecified: Secondary | ICD-10-CM

## 2020-12-01 DIAGNOSIS — I4891 Unspecified atrial fibrillation: Secondary | ICD-10-CM

## 2020-12-01 DIAGNOSIS — M8589 Other specified disorders of bone density and structure, multiple sites: Secondary | ICD-10-CM

## 2020-12-01 MED ORDER — LOVASTATIN 20 MG PO TABS: 20.0000 mg | ORAL_TABLET | Freq: Every day | ORAL | 3 refills | Status: DC

## 2020-12-01 MED ORDER — ALENDRONATE SODIUM 70 MG PO TABS: 70.0000 mg | ORAL_TABLET | ORAL | 1 refills | Status: DC

## 2020-12-01 MED ORDER — LISINOPRIL-HYDROCHLOROTHIAZIDE 20-25 MG PO TABS: 1.0000 | ORAL_TABLET | Freq: Every day | ORAL | 0 refills | Status: DC

## 2020-12-01 MED ORDER — METOPROLOL SUCCINATE ER 25 MG PO TB24: 12.5000 mg | ORAL_TABLET | Freq: Every day | ORAL | 1 refills | Status: DC

## 2020-12-01 MED FILL — LOVASTATIN 20 MG TABS: 20 | 30 days supply | Qty: 30 | Fill #0

## 2020-12-01 MED FILL — ALENDRONATE NA 70 MG TAB: 70 | 28 days supply | Qty: 4 | Fill #0

## 2020-12-01 MED FILL — LISINOPRIL-HYDROCHLOROTHIAZ: 20-25 | 30 days supply | Qty: 30 | Fill #0

## 2020-12-01 MED FILL — METOPROLOL SUCCINATE ER 25: 25 | 30 days supply | Qty: 15 | Fill #0

## 2020-12-01 NOTE — Telephone Encounter (Signed)
Medication:  alendronate (FOSAMAX) 70 MG tablet [[id number]]  metoprolol succinate (TOPROL-XL) 25 MG 24 hr tablet [[id number]]  lisinopril-hydrochlorothiazide (ZESTORETIC) 20-25 MG tablet [[id number]]  lovastatin (MEVACOR) 20 MG tablet [[id number]]   Has the patient contacted their pharmacy? Yes   (Agent: If yes, when and what did the pharmacy advise?) To call the office   Preferred Pharmacy (with phone number or street name):  [facility] - [place], [place] - [place] [address]  [address], [place] [address]  Phone:  [phone] Fax:  [phone]  Agent: Please be advised that RX refills may take up to 3 business days. We ask that you follow-up with your pharmacy.

## 2020-12-04 ENCOUNTER — Ambulatory Visit: Payer: Self-pay | Attending: Primary Care

## 2020-12-04 ENCOUNTER — Other Ambulatory Visit: Payer: Self-pay

## 2020-12-12 ENCOUNTER — Telehealth: Payer: Self-pay | Admitting: Primary Care

## 2020-12-12 NOTE — Telephone Encounter (Signed)
I return Pt call, LVM that I received the document she was missing

## 2020-12-12 NOTE — Telephone Encounter (Signed)
Patient came in to drop off documents for [facility]. Patient would like a call back form Mikle Bosworth to see if all the documents have been turned in and to see about the OC. Please f/u

## 2021-01-01 ENCOUNTER — Ambulatory Visit (INDEPENDENT_AMBULATORY_CARE_PROVIDER_SITE_OTHER): Payer: Self-pay | Admitting: Primary Care

## 2021-01-01 MED FILL — LISINOPRIL-HYDROCHLOROTHIAZ: 20-25 | 30 days supply | Qty: 30 | Fill #1

## 2021-01-01 MED FILL — LOVASTATIN 20 MG TABS: 20 | 30 days supply | Qty: 30 | Fill #1

## 2021-01-01 MED FILL — METOPROLOL SUCCINATE ER 25: 25 | 30 days supply | Qty: 15 | Fill #1

## 2021-01-01 MED FILL — ALENDRONATE NA 70 MG TAB: 70 | 28 days supply | Qty: 4 | Fill #1

## 2021-01-17 ENCOUNTER — Ambulatory Visit (INDEPENDENT_AMBULATORY_CARE_PROVIDER_SITE_OTHER): Payer: Self-pay | Admitting: Primary Care

## 2021-01-24 ENCOUNTER — Other Ambulatory Visit: Payer: Self-pay

## 2021-01-24 ENCOUNTER — Ambulatory Visit (INDEPENDENT_AMBULATORY_CARE_PROVIDER_SITE_OTHER): Payer: Self-pay | Admitting: Primary Care

## 2021-01-24 ENCOUNTER — Encounter (INDEPENDENT_AMBULATORY_CARE_PROVIDER_SITE_OTHER): Payer: Self-pay | Admitting: Primary Care

## 2021-01-24 ENCOUNTER — Other Ambulatory Visit: Payer: Self-pay | Admitting: Primary Care

## 2021-01-24 VITALS — BP 132/81 | HR 60 | Temp 97.5°F | Ht 62.0 in | Wt 186.8 lb

## 2021-01-24 DIAGNOSIS — M8589 Other specified disorders of bone density and structure, multiple sites: Secondary | ICD-10-CM

## 2021-01-24 DIAGNOSIS — Z23 Encounter for immunization: Secondary | ICD-10-CM

## 2021-01-24 DIAGNOSIS — E785 Hyperlipidemia, unspecified: Secondary | ICD-10-CM

## 2021-01-24 DIAGNOSIS — I10 Essential (primary) hypertension: Secondary | ICD-10-CM

## 2021-01-24 DIAGNOSIS — Z1231 Encounter for screening mammogram for malignant neoplasm of breast: Secondary | ICD-10-CM

## 2021-01-24 DIAGNOSIS — I482 Chronic atrial fibrillation, unspecified: Secondary | ICD-10-CM

## 2021-01-24 MED ORDER — METOPROLOL SUCCINATE ER 25 MG PO TB24: 12.5000 mg | ORAL_TABLET | Freq: Every day | ORAL | 1 refills | Status: DC

## 2021-01-24 MED ORDER — LISINOPRIL-HYDROCHLOROTHIAZIDE 20-25 MG PO TABS: 1.0000 | ORAL_TABLET | Freq: Every day | ORAL | 0 refills | Status: DC

## 2021-01-24 MED ORDER — ALENDRONATE SODIUM 70 MG PO TABS: 70.0000 mg | ORAL_TABLET | ORAL | 3 refills | Status: DC

## 2021-01-24 MED ORDER — LOVASTATIN 20 MG PO TABS: 20.0000 mg | ORAL_TABLET | Freq: Every day | ORAL | 1 refills | Status: DC

## 2021-01-24 MED FILL — ALENDRONATE NA 70 MG TAB: 70 | 28 days supply | Qty: 4 | Fill #0

## 2021-01-24 NOTE — Progress Notes (Signed)
[facility]   Hypertension evaluation, on previous visit medication was adjusted to include increasing lisinopril 20/HCTZ 25 . She does voice concerns of right side pain after coughing for several weeks.   Patient reports adherence with medications.  Current Medication List Current Outpatient Medications on File Prior to Visit  Medication Sig Dispense Refill  . acetaminophen (TYLENOL) 500 MG tablet Take 1,000 mg by mouth every 6 (six) hours as needed for mild pain or headache.     No current facility-administered medications on file prior to visit.   Past Medical History  Past Medical History:  Diagnosis Date  . Allergy    spring pollen  . Arthritis   . Astigmatism, 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  . Atrial fibrillation ([place])   . Cataract 01/28/2017  . High cholesterol   . History of kidney stones   . 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 Hypertension   . Osteoarthritis of ankle, right    and Subtalar Joint  . Osteoporosis   . Presbyopia of both eyes 01/28/2017  . Pterygium eye, 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   Dietary habits include:  Exercise habits include:healthy heart Family / Social history: unknown   ASCVD risk factors include- [place]  O:  Physical Exam Vitals reviewed.  Constitutional:      Appearance: She is obese.  HENT:     Head: Normocephalic.     Right Ear: Tympanic membrane and external ear normal.     Left Ear: Tympanic membrane and external ear normal.     Nose: Nose normal.  Eyes:     Extraocular Movements: Extraocular movements intact.  Cardiovascular:     Rate and Rhythm: Normal rate.  Pulmonary:     Effort: Pulmonary effort is normal.     Breath sounds: Normal breath sounds.  Abdominal:     General: Bowel sounds are normal.     Palpations: Abdomen is soft.  Musculoskeletal:        General: Normal range of motion.  Skin:    General: Skin is warm and dry.  Neurological:     Mental Status: She is alert and oriented to person, place, and time.   Psychiatric:        Mood and Affect: Mood normal.        Behavior: Behavior normal.        Thought Content: Thought content normal.        Judgment: Judgment normal.      Review of Systems  All other systems reviewed and are negative.   Last 3 Office BP readings: BP Readings from Last 3 Encounters:  01/24/21 132/81  06/30/20 (!) 145/71  01/07/20 121/67    BMET    Component Value Date/Time   NA 143 01/11/2020 0948   K 4.0 01/11/2020 0948   CL 105 01/11/2020 0948   CO2 25 01/11/2020 0948   GLUCOSE 94 01/11/2020 0948   GLUCOSE 111 (H) 09/30/2019 2358   BUN 13 01/11/2020 0948   CREATININE 0.63 01/11/2020 0948   CREATININE 0.51 12/13/2014 1300   CALCIUM 10.0 01/11/2020 0948   GFRNONAA 91 01/11/2020 0948   GFRNONAA >89 12/13/2014 1300   GFRAA 105 01/11/2020 0948   GFRAA >89 12/13/2014 1300    Renal function: CrCl cannot be calculated (Patient's most recent lab result is older than the maximum 21 days allowed.).  Clinical ASCVD: Yes  The 10-year ASCVD risk score Mikey Bussing DC Jr., et al., 2013) is: 14.2%   Values used to calculate the score:     Age: 70  years     Sex: Female     Is Non-Hispanic African American: No     Diabetic: No     Tobacco smoker: No     Systolic Blood Pressure: 631 mmHg     Is BP treated: Yes     HDL Cholesterol: 64 mg/dL     Total Cholesterol: 186 mg/dL  Current Outpatient Medications on File Prior to Visit  Medication Sig Dispense Refill  . acetaminophen (TYLENOL) 500 MG tablet Take 1,000 mg by mouth every 6 (six) hours as needed for mild pain or headache.     No current facility-administered medications on file prior to visit.     Diagnoses and all orders for this visit:  Encounter for screening mammogram for malignant neoplasm of breast Patient completed application for BCCP while in clinic and application has and faxed to [facility]. Patient aware that [facility] will contact her directly to schedule appointment.  Essential  hypertension Hypertension longstanding lisinopril/HCTZ 20/25  and metoprolol XL 12.5  on current medications. BP Goal 130/80  mmHg. Patient is adherent with current medications.  -Continued  -F/u labs ordered - CMP, CBC, Lipid  -Counseled on lifestyle modifications for blood pressure control including reduced dietary sodium, increased exercise, adequate sleep -     lisinopril-hydrochlorothiazide (ZESTORETIC) 20-25 MG tablet; Take 1 tablet by mouth daily. -     CMP14+EGFR -     CBC with Differential  Osteopenia of multiple sites -     alendronate (FOSAMAX) 70 MG tablet; Take 1 tablet (70 mg total) by mouth every 7 (seven) days. Take with a full glass of water on an empty stomach.  Hyperlipidemia, unspecified hyperlipidemia type -     lovastatin (MEVACOR) 20 MG tablet; Take 1 tablet (20 mg total) by mouth at bedtime. -     Lipid Panel  Chronic atrial fibrillation (HCC) -     metoprolol succinate (TOPROL-XL) 25 MG 24 hr tablet; Take 0.5 tablets (12.5 mg total) by mouth daily.  Need for immunization against influenza -     Flu Vaccine QUAD 36+ mos IM   Kerin Perna

## 2021-01-24 NOTE — Patient Instructions (Addendum)
G Cmo controlar su hipertensin Managing Your Hypertension La hipertensin, tambin conocida como presin arterial alta, se produce cuando la sangre ejerce presin contra las paredes de las arterias con [place] fuerza. Las arterias son los vasos sanguneos que transportan la sangre desde el corazn hacia todas las partes del cuerpo. La hipertensin hace que el corazn haga ms esfuerzo para Marine scientist y puede provocar que las arterias se Armed forces training and education officer o Multimedia programmer. Qu significan las lecturas de la presin arterial La presin arterial deseada puede variar en funcin de las enfermedades, la edad y otros factores personales. Una lectura de la presin arterial consta de un nmero ms alto sobre un nmero ms bajo. En condiciones ideales, la presin arterial debe estar por debajo de 120/80. Es importante que conozca lo siguiente:  El Gaffer, o nmero superior, es la presin sistlica. Es la medida de la presin de las arterias cuando el corazn late.  El segundo nmero, o nmero inferior, es la presin diastlica. Es la medida de la presin en las arterias cuando el corazn se relaja. La presin arterial se clasifica en cuatro etapas. Sobre la base de la lectura de su presin arterial, el mdico puede usar las siguientes etapas para determinar si necesita tratamiento y de qu tipo. La presin sistlica y la presin diastlica se miden en una unidad llamada mmHg. Normales  Presin sistlica: por debajo de 120.  Presin diastlica: por debajo de 80. Elevada  Presin sistlica: 120-129.  Presin diastlica: por debajo de 80. Etapa 1 de hipertensin  Presin sistlica: 130-139.  Presin diastlica: 80-89. Etapa 2 de hipertensin  Presin sistlica: 140 o ms.  Presin diastlica: 90 o ms. Cmo puede afectarme esta enfermedad? Controlar la hipertensin es una responsabilidad importante. Con el transcurso del [place], la hipertensin puede daar las arterias y Engineer, manufacturing systems flujo  de sangre hacia partes importantes del cuerpo que incluyen el cerebro, el corazn y los riones. Tener hipertensin no tratada o no controlada puede causar:  Infarto de miocardio.  Accidente cerebrovascular.  Debilitamiento de los vasos sanguneos (aneurisma).  Insuficiencia cardaca.  Dao renal.  Dao ocular.  Sndrome metablico.  Problemas de memoria y concentracin.  Demencia vascular. Qu medidas puedo tomar para controlar esta afeccin? La hipertensin se puede controlar haciendo Dana[employer] Corporation estilo de vida y, posiblemente, tomando medicamentos. Su mdico le ayudar a crear un plan para bajar la presin arterial al rango normal. Nutricin  Siga una dieta con alto contenido de fibras y [place], y con bajo contenido de sal (sodio), azcar agregada y grasas. Un ejemplo de plan alimenticio es la dieta sobre mtodos alimenticios para detener la hipertensin llamada DASH (Dietary Approaches to Stop Hypertension). Para alimentarse de esta manera: ? Coma mucha fruta y verdura fresca. Trate de que la mitad del plato de cada comida sea de frutas y verduras. ? Coma cereales integrales, como pasta integral, arroz integral o pan integral. Llene aproximadamente un cuarto del plato con cereales integrales. ? Consuma productos lcteos descremados. ? Evite la ingesta de cortes de carne grasa, carne procesada o curada, y carne de ave con piel. Llene aproximadamente un cuarto del plato con protenas magras, como pescado, pollo sin piel, frijoles, huevos o tofu. ? Evite ingerir alimentos prehechos y procesados. En general, estos tienen mayor cantidad de sodio, azcar agregada y Steffanie [place].  Reduzca su ingesta diaria de sodio. La mayora de las personas que tienen hipertensin deben comer menos de 1500 mg de sodio por [employer] World[employer].   Estilo de vida  [place]  con su mdico para mantener un peso saludable o perder The P[employer] Financial. Pregntele cul es el peso recomendado para usted.  Realice al menos 30 minutos de  ejercicio que haga que se acelere su corazn (ejercicio Magazine features editor) la DIRECTV de la [place]. Estas actividades pueden incluir caminar, nadar o andar en bicicleta.  Incluya ejercicios para fortalecer sus msculos (ejercicios de resistencia), como levantamiento de pesas, como parte de su rutina semanal de ejercicios. Intente realizar de este tipo de ejercicios al Kellogg a la [place].  No consuma ningn producto que contenga nicotina o tabaco, como cigarrillos, cigarrillos electrnicos y tabaco de Theatre manager. Si necesita ayuda para dejar de fumar, consulte al American Express.  Controle las enfermedades a largo plazo (crnicas), como el colesterol alto o la diabetes.  Identifique sus causas de estrs y encuentre maneras de Charity fundraiser. Esto puede incluir meditacin, respiracin profunda o hacerse tiempo para Arts development officer divertidas.   Consumo de alcohol  No beba alcohol si: ? Su mdico le indica no hacerlo. ? Est embarazada, puede estar embarazada o est tratando de quedar embarazada.  Si bebe alcohol: ? Limite la cantidad que bebe:  De 0 a 1 medida por da para las mujeres.  De 0 a 2 medidas por da para los hombres. ? Est atento a la cantidad de alcohol que hay en las bebidas que toma. En los [place], una medida equivale a una botella de cerveza de 12oz ( ), un vaso de vino de 5oz ( ) o un vaso de una bebida alcohlica de alta graduacin de 1oz (74ml). Medicamentos El mdico puede recetarle medicamentos si los cambios en el estilo de vida no son suficientes para Museum/gallery curator la presin arterial y si:  Su presin arterial sistlica es de 130 o ms.  Su presin arterial diastlica es de 80 o ms. Use los medicamentos solamente como se lo haya indicado el mdico. Siga cuidadosamente las indicaciones. Los medicamentos para la presin arterial deben tomarse como se lo haya indicado el mdico. Los medicamentos pierden eficacia al omitir  las dosis. El hecho de omitir las dosis tambin [place] el riesgo de otros problemas. Monitoreo Antes de [facility] presin arterial:  No fume, no consuma bebidas con cafena ni haga ejercicio dentro de los 30 minutos antes de tomar la medicin.  Vaya al bao y vace la vejiga (orine).  Permanezca sentado tranquilamente durante al menos 5 minutos antes de tomar las mediciones. Contrlese la presin arterial en su casa segn las indicaciones del mdico. Para hacer esto:  Sintese con la espalda recta y con apoyo.  Coloque los pies planos en el piso. No se cruce de piernas.  Apoye el brazo sobre una superficie plana, como una mesa. Asegrese de que la parte superior del brazo est al nivel del corazn.  Cada vez que tome una medicin, tome dos o tres lecturas con un minuto de separacin y Limited Brands. Posiblemente tambin sea necesario que el mdico le controle la presin arterial de [place] regular.   Informacin general  Hable con su mdico acerca de la dieta, hbitos de ejercicio y otros factores del estilo de vida que pueden contribuir a la hipertensin.  Revise con su mdico todos los medicamentos que toma ya que puede haber efectos secundarios o interacciones.  Concurra a todas las visitas tal como se lo haya indicado el mdico. El mdico puede ayudarle a crear y Dawayne Patricia su plan para controlar la presin arterial alta. Dnde buscar ms informacin  National  Heart, Lung, and Blood Institute (Instituto Nacional del [place], los Pulmones y la [place]): PopSteam.is  American Heart Association (Asociacin Estadounidense del Corazn): www.heart.org Comunquese con un mdico si:  Piensa que tiene Runner, broadcasting/film/video a los medicamentos que ha tomado.  Tiene dolores de cabeza frecuentes (recurrentes).  Siente mareos.  Tiene hinchazn en los tobillos.  Tiene problemas de visin. Solicite ayuda de inmediato si:  Siente un dolor de cabeza intenso o  confusin.  Siente debilidad inusual, adormecimiento o que Hospital doctor.  Siente un dolor intenso en el pecho o el abdomen.  Vomita repetidas veces.  Tiene dificultad para respirar. Estos sntomas pueden representar un problema grave que constituye Radio broadcast assistant. No espere a ver si los sntomas desaparecen. Solicite atencin mdica de inmediato. Comunquese con el servicio de emergencias de su localidad (911 en los [place]). No conduzca por sus propios medios [employer] hospital. Resumen  La hipertensin se produce cuando la sangre bombea en las arterias con mucha fuerza. Si esta afeccin no se controla, podra correr riesgo de tener complicaciones graves.  La presin arterial deseada puede variar en funcin de las enfermedades, la edad y otros factores personales. Para la [employer] Resources, una presin arterial normal es menor que 120/80.  La hipertensin se puede controlar mediante cambios en el estilo de vida, tomando medicamentos, o ambas cosas.  Los Danaher Corporation estilo de vida para ayudar a Chief Operating Officer la hipertensin incluyen prdida de [place], seguir una dieta saludable, con bajo contenido de sodio, hacer ms ejercicio, dejar de fumar y limitar el consumo de alcohol. Esta informacin no tiene Theme park manager el consejo del mdico. Asegrese de hacerle al mdico cualquier pregunta que tenga. Document Revised: 01/14/2020 Document Reviewed: 01/14/2020 Elsevier Patient Education  2021 Elsevier Inc. ripe en los adultos Influenza, Adult A la gripe tambin se la conoce como "influenza". Es una Advance Auto , la nariz y la garganta (vas respiratorias). Se transmite fcilmente de persona a persona (es contagiosa). La gripe causa sntomas que son Lubrizol Corporation de un resfro, junto con fiebre alta y dolores corporales. Cules son las causas? La causa de esta afeccin es el virus de la influenza. Puede contraer el virus de las siguientes maneras:  Al inhalar gotitas que  quedan en el aire despus de que una persona infectada con gripe tosi o estornud.  Al tocar algo que est contaminado con el virus y [facility] mano a la boca, la nariz o los ojos. Qu incrementa el riesgo? Hay ciertas cosas que lo pueden hacer ms propenso a Warden/ranger. Estas incluyen lo siguiente:  No lavarse las manos con frecuencia.  Tener contacto cercano con Yahoo durante la temporada de resfro y gripe.  Tocarse la boca, los ojos o la nariz sin antes lavarse las manos.  No recibir la Teachers Insurance and Annuity Association. Puede correr un mayor riesgo de [place] gripe, y [place] graves, como una infeccin pulmonar (neumona), si usted:  Es mayor de 65 aos de edad.  Est embarazada.  Tiene debilitado el sistema que combate las defensas (sistema inmunitario) debido a una enfermedad o a que toma determinados medicamentos.  Tiene una afeccin a largo plazo (crnica), como las siguientes: ? Enfermedad cardaca, renal o pulmonar. ? Diabetes. ? Asma.  Tiene un trastorno heptico.  Tiene mucho sobrepeso (obesidad [place]).  Tiene anemia. Cules son los signos o sntomas? Los sntomas normalmente comienzan de repente y Armando Reichert [address] y [address] Pueden incluir los siguientes:  Grant Ruts y escalofros.  Dolores de [place], dolores en el cuerpo o dolores musculares.  Dolor de Advertising copywriter.  Tos.  Secrecin o congestin nasal.  Molestias en el pecho.  No querer comer tanto como lo hace normalmente.  Sensacin de debilidad o cansancio.  Mareos.  Malestar estomacal o vmitos. Cmo se trata? Si la gripe se detecta de forma temprana, puede recibir tratamiento con medicamentos antivirales. Esto puede ayudar a reducir la gravedad y la duracin de la enfermedad. Se los administrarn por boca o a travs de un tubo (catter) intravenoso. Cuidarse en su hogar puede ayudar a que mejoren los sntomas. El mdico puede recomendarle lo siguiente:  Tomar medicamentos de  Sales promotion account executive.  Beber abundante lquido. La gripe suele desaparecer sola. Si tiene sntomas muy graves u otros problemas, puede recibir tratamiento en un hospital. Siga estas instrucciones en su casa: Actividad  Descanse todo lo que sea necesario. Duerma lo suficiente.  Lanny Hurst en su casa y no concurra al Aleen Campi o a la escuela, como se lo haya indicado el mdico. ? No salga de su casa hasta que no haya tenido fiebre por 24horas sin tomar medicamentos. ? Salga de su casa solamente para ir al American Express. Comida y bebida  Beverely Risen SRO (solucin de rehidratacin oral). Es Neomia Dear bebida que se vende en farmacias y tiendas.  Beba suficiente lquido como para Pharmacologist la orina de color amarillo plido.  En la medida en que pueda, beba lquidos transparentes en pequeas cantidades. Los lquidos transparentes son, por ejemplo: ? Westley Hummer. ? Trocitos de hielo. ? Jugo de frutas mezclado con agua. ? Bebidas deportivas de bajas caloras.  Coma alimentos suaves que sean fciles de digerir. En la medida que pueda, consuma pequeas cantidades. Estos alimentos incluyen: ? Bananas. ? Pur de Praxair. ? Arroz. ? BJ's. ? Tostadas. ? Galletas.  No coma ni beba lo siguiente: ? Lquidos con alto contenido de azcar o cafena. ? Alcohol. ? Alimentos condimentados o con alto contenido de [place] Indicaciones generales  Use los medicamentos de venta libre y los recetados solamente como se lo haya indicado el mdico.  Use un humidificador de aire fro para que el aire de su casa est ms hmedo. Esto puede facilitar la respiracin. ? Cuando utilice un humidificador de vapor fro, lmpielo a diario. Vace el agua y [place] por agua limpia.  Al toser o estornudar, cbrase la boca y la [place].  Lvese las manos frecuentemente con agua y [place] y durante al menos 20 segundos. Esto tambin es importante despus de toser o Engineering geologist. Si no dispone de [place] y [place], use desinfectante para manos con  alcohol.  Cumpla con todas las visitas de seguimiento.      Cmo se previene?  Colquese la vacuna antigripal todos los [place]. Puede colocarse la vacuna contra la gripe a fines de verano, en otoo o en invierno. Pregntele al mdico cundo debe aplicarse la vacuna contra la gripe.  Evite el contacto con personas que estn enfermas durante el otoo y el invierno. Es la temporada del resfro y Emergency planning/management officer.   Comunquese con un mdico si:  Tiene sntomas nuevos.  Tiene los siguientes sntomas: ? Dolor de Hotel manager. ? Materia fecal lquida (diarrea). ? Fiebre.  La tos empeora.  Empieza a tener ms mucosidad.  Tiene Programme researcher, broadcasting/film/video.  Vomita. Solicite ayuda de inmediato si:  Le falta el aire.  Tiene dificultad para respirar.  La piel o las uas se ponen de un color azulado.  Presenta dolor muy intenso  o rigidez en el cuello.  Tiene dolor de cabeza repentino.  Le duele la cara o el odo de forma repentina.  No puede comer ni beber sin vomitar. Estos sntomas pueden representar un problema grave que constituye Radio broadcast assistantuna emergencia. Solicite atencin mdica de inmediato. Comunquese con el servicio de emergencias de su localidad (911 en los [place]).  No espere a ver si los sntomas desaparecen.  No conduzca por sus propios medios OfficeMax Incorporatedhasta el hospital. Resumen  A la gripe tambin se la conoce como "influenza". Es una Advance Auto infeccin en los pulmones, la nariz y Administratorla garganta. Se transmite fcilmente de [place] persona a otra.  Use los medicamentos de venta libre y los recetados solamente como se lo haya indicado el mdico.  Aplicarse la vacuna contra la gripe todos los aos es la mejor manera de no contagiarse la gripe. Esta informacin no tiene Theme park managercomo fin reemplazar el consejo del mdico. Asegrese de hacerle al mdico cualquier pregunta que tenga. Document Revised: 10/05/2020 Document Reviewed: 10/05/2020 Elsevier Patient Education  2021 Arvin[employer]itorElsevier Inc.

## 2021-01-25 LAB — CBC WITH DIFFERENTIAL/PLATELET
Basophils Absolute: 0 10*3/uL (ref 0.0–0.2)
Basos: 1 %
EOS (ABSOLUTE): 0.3 10*3/uL (ref 0.0–0.4)
Eos: 6 %
Hematocrit: 38.7 % (ref 34.0–46.6)
Hemoglobin: 13.4 g/dL (ref 11.1–15.9)
Immature Grans (Abs): 0 10*3/uL (ref 0.0–0.1)
Immature Granulocytes: 0 %
Lymphocytes Absolute: 1.4 10*3/uL (ref 0.7–3.1)
Lymphs: 26 %
MCH: 31.4 pg (ref 26.6–33.0)
MCHC: 34.6 g/dL (ref 31.5–35.7)
MCV: 91 fL (ref 79–97)
Monocytes Absolute: 0.5 10*3/uL (ref 0.1–0.9)
Monocytes: 9 %
Neutrophils Absolute: 3.2 10*3/uL (ref 1.4–7.0)
Neutrophils: 58 %
Platelets: 315 10*3/uL (ref 150–450)
RBC: 4.27 x10E6/uL (ref 3.77–5.28)
RDW: 11.8 % (ref 11.7–15.4)
WBC: 5.5 10*3/uL (ref 3.4–10.8)

## 2021-01-25 LAB — CMP14+EGFR
ALT: 18 IU/L (ref 0–32)
AST: 22 IU/L (ref 0–40)
Albumin/Globulin Ratio: 1.5 (ref 1.2–2.2)
Albumin: 4.4 g/dL (ref 3.7–4.7)
Alkaline Phosphatase: 88 IU/L (ref 44–121)
BUN/Creatinine Ratio: 21 (ref 12–28)
BUN: 15 mg/dL (ref 8–27)
Bilirubin Total: 0.5 mg/dL (ref 0.0–1.2)
CO2: 26 mmol/L (ref 20–29)
Calcium: 10.3 mg/dL (ref 8.7–10.3)
Chloride: 102 mmol/L (ref 96–106)
Creatinine, Ser: 0.73 mg/dL (ref 0.57–1.00)
GFR calc Af Amer: 96 mL/min/{1.73_m2} (ref >59)
GFR calc non Af Amer: 83 mL/min/{1.73_m2} (ref >59)
Globulin, Total: 2.9 g/dL (ref 1.5–4.5)
Glucose: 92 mg/dL (ref 65–99)
Potassium: 5.1 mmol/L (ref 3.5–5.2)
Sodium: 141 mmol/L (ref 134–144)
Total Protein: 7.3 g/dL (ref 6.0–8.5)

## 2021-01-25 LAB — LIPID PANEL
Chol/HDL Ratio: 2.8 ratio (ref 0.0–4.4)
Cholesterol, Total: 203 mg/dL — ABNORMAL HIGH (ref 100–199)
HDL: 72 mg/dL (ref >39)
LDL Chol Calc (NIH): 112 mg/dL — ABNORMAL HIGH (ref 0–99)
Triglycerides: 111 mg/dL (ref 0–149)
VLDL Cholesterol Cal: 19 mg/dL (ref 5–40)

## 2021-01-26 ENCOUNTER — Other Ambulatory Visit: Payer: Self-pay | Admitting: Nurse Practitioner

## 2021-01-26 DIAGNOSIS — Z1231 Encounter for screening mammogram for malignant neoplasm of breast: Secondary | ICD-10-CM

## 2021-01-26 MED FILL — LOVASTATIN 20 MG TABS: 20 | 30 days supply | Qty: 30 | Fill #2

## 2021-01-26 MED FILL — LISINOPRIL-HYDROCHLOROTHIAZ: 20-25 | 30 days supply | Qty: 30 | Fill #2

## 2021-01-26 MED FILL — METOPROLOL SUCCINATE ER 25: 25 | 30 days supply | Qty: 15 | Fill #2

## 2021-02-06 MED FILL — METOPROLOL SUCCINATE ER 25: 25 | 30 days supply | Qty: 15 | Fill #2

## 2021-02-06 MED FILL — ALENDRONATE NA 70 MG TAB: 70 | 28 days supply | Qty: 4 | Fill #0

## 2021-02-06 MED FILL — LISINOPRIL-HYDROCHLOROTHIAZ: 20-25 | 30 days supply | Qty: 30 | Fill #2

## 2021-02-06 MED FILL — LOVASTATIN 20 MG TABS: 20 | 30 days supply | Qty: 30 | Fill #2

## 2021-02-20 ENCOUNTER — Other Ambulatory Visit: Payer: Self-pay

## 2021-02-20 ENCOUNTER — Ambulatory Visit
Admission: RE | Admit: 2021-02-20 | Discharge: 2021-02-20 | Disposition: A | Discharge status: Home / Self Care | Payer: No Typology Code available for payment source | Source: Ambulatory Visit | Attending: Nurse Practitioner | Admitting: Nurse Practitioner

## 2021-02-20 DIAGNOSIS — Z1231 Encounter for screening mammogram for malignant neoplasm of breast: Secondary | ICD-10-CM

## 2021-03-12 MED FILL — ALENDRONATE NA 70 MG TAB: 70 | 28 days supply | Qty: 4 | Fill #1

## 2021-03-12 MED FILL — METOPROLOL SUCCINATE ER 25: 25 | 30 days supply | Qty: 15 | Fill #3

## 2021-03-21 MED FILL — LISINOPRIL-HYDROCHLOROTHIAZ: 20-25 | 30 days supply | Qty: 30 | Fill #3

## 2021-03-21 MED FILL — LOVASTATIN 20 MG TABS: 20 | 30 days supply | Qty: 30 | Fill #3

## 2021-04-11 ENCOUNTER — Other Ambulatory Visit: Payer: Self-pay

## 2021-04-11 MED FILL — Alendronate Sodium Tab 70 MG: ORAL | 28 days supply | Qty: 4 | Fill #0 | Status: AC

## 2021-04-12 ENCOUNTER — Other Ambulatory Visit: Payer: Self-pay

## 2021-04-26 ENCOUNTER — Other Ambulatory Visit: Payer: Self-pay

## 2021-04-26 MED FILL — Lisinopril & Hydrochlorothiazide Tab 20-25 MG: ORAL | 30 days supply | Qty: 30 | Fill #0 | Status: AC

## 2021-04-26 MED FILL — Metoprolol Succinate Tab ER 24HR 25 MG (Tartrate Equiv): ORAL | 30 days supply | Qty: 15 | Fill #0 | Status: AC

## 2021-04-26 MED FILL — Lovastatin Tab 20 MG: ORAL | 30 days supply | Qty: 30 | Fill #0 | Status: AC

## 2021-05-30 ENCOUNTER — Other Ambulatory Visit: Payer: Self-pay

## 2021-05-30 MED FILL — Metoprolol Succinate Tab ER 24HR 25 MG (Tartrate Equiv): ORAL | 30 days supply | Qty: 15 | Fill #1 | Status: AC

## 2021-05-30 MED FILL — Lovastatin Tab 20 MG: ORAL | 30 days supply | Qty: 30 | Fill #1 | Status: AC

## 2021-05-30 MED FILL — Lisinopril & Hydrochlorothiazide Tab 20-25 MG: ORAL | 30 days supply | Qty: 30 | Fill #1 | Status: AC

## 2021-05-30 MED FILL — Alendronate Sodium Tab 70 MG: ORAL | 28 days supply | Qty: 4 | Fill #1 | Status: AC

## 2021-06-28 ENCOUNTER — Other Ambulatory Visit (INDEPENDENT_AMBULATORY_CARE_PROVIDER_SITE_OTHER): Payer: Self-pay | Admitting: Primary Care

## 2021-06-28 ENCOUNTER — Other Ambulatory Visit: Payer: Self-pay

## 2021-06-28 MED ORDER — ALENDRONATE SODIUM 70 MG PO TABS
ORAL_TABLET | ORAL | 3 refills | Status: DC
  Filled 2021-06-28: qty 4, 28d supply, fill #0

## 2021-06-28 MED FILL — Metoprolol Succinate Tab ER 24HR 25 MG (Tartrate Equiv): ORAL | 30 days supply | Qty: 15 | Fill #2 | Status: AC

## 2021-06-28 MED FILL — Lovastatin Tab 20 MG: ORAL | 30 days supply | Qty: 30 | Fill #2 | Status: AC

## 2021-06-28 MED FILL — Lisinopril & Hydrochlorothiazide Tab 20-25 MG: ORAL | 30 days supply | Qty: 30 | Fill #0 | Status: AC

## 2021-06-29 ENCOUNTER — Other Ambulatory Visit: Payer: Self-pay

## 2021-07-06 ENCOUNTER — Other Ambulatory Visit: Payer: Self-pay

## 2021-07-27 ENCOUNTER — Other Ambulatory Visit: Payer: Self-pay

## 2021-07-27 MED FILL — Metoprolol Succinate Tab ER 24HR 25 MG (Tartrate Equiv): ORAL | 30 days supply | Qty: 15 | Fill #3 | Status: AC

## 2021-08-15 ENCOUNTER — Other Ambulatory Visit: Payer: Self-pay

## 2021-08-15 MED FILL — Lisinopril & Hydrochlorothiazide Tab 20-25 MG: ORAL | 30 days supply | Qty: 30 | Fill #2 | Status: AC

## 2021-08-15 MED FILL — Lovastatin Tab 20 MG: ORAL | 30 days supply | Qty: 30 | Fill #3 | Status: AC

## 2021-08-29 ENCOUNTER — Other Ambulatory Visit: Payer: Self-pay

## 2021-08-29 MED FILL — Metoprolol Succinate Tab ER 24HR 25 MG (Tartrate Equiv): ORAL | 30 days supply | Qty: 15 | Fill #0 | Status: AC

## 2021-09-04 ENCOUNTER — Ambulatory Visit (INDEPENDENT_AMBULATORY_CARE_PROVIDER_SITE_OTHER): Payer: Self-pay | Admitting: Primary Care

## 2021-09-04 ENCOUNTER — Encounter (INDEPENDENT_AMBULATORY_CARE_PROVIDER_SITE_OTHER): Payer: Self-pay | Admitting: Primary Care

## 2021-09-04 ENCOUNTER — Other Ambulatory Visit: Payer: Self-pay

## 2021-09-04 VITALS — BP 149/82 | HR 58 | Temp 97.2°F | Ht 62.0 in | Wt 190.8 lb

## 2021-09-04 DIAGNOSIS — I10 Essential (primary) hypertension: Secondary | ICD-10-CM

## 2021-09-04 DIAGNOSIS — E785 Hyperlipidemia, unspecified: Secondary | ICD-10-CM

## 2021-09-04 DIAGNOSIS — Z6834 Body mass index (BMI) 34.0-34.9, adult: Secondary | ICD-10-CM

## 2021-09-04 MED ORDER — LISINOPRIL-HYDROCHLOROTHIAZIDE 20-25 MG PO TABS
1.0000 | ORAL_TABLET | Freq: Every day | ORAL | 3 refills | Status: DC
  Filled 2021-09-04: qty 90, 90d supply, fill #0
  Filled 2021-09-11: qty 30, 30d supply, fill #0
  Filled 2021-10-23: qty 30, 30d supply, fill #1
  Filled 2021-11-23: qty 30, 30d supply, fill #2
  Filled 2021-12-31: qty 30, 30d supply, fill #0
  Filled 2022-02-06: qty 30, 30d supply, fill #1
  Filled 2022-03-11: qty 30, 30d supply, fill #2
  Filled 2022-04-10: qty 30, 30d supply, fill #3
  Filled 2022-05-13: qty 30, 30d supply, fill #4
  Filled 2022-06-20: qty 30, 30d supply, fill #5
  Filled 2022-07-23: qty 30, 30d supply, fill #6

## 2021-09-04 MED ORDER — METOPROLOL SUCCINATE ER 25 MG PO TB24
ORAL_TABLET | Freq: Every day | ORAL | 1 refills | Status: DC
  Filled 2021-09-04: qty 180, fill #0
  Filled 2021-09-11: qty 15, 30d supply, fill #0
  Filled 2021-10-23: qty 15, 30d supply, fill #1
  Filled 2021-11-23: qty 15, 30d supply, fill #2
  Filled 2021-12-31: qty 15, 30d supply, fill #0
  Filled 2022-02-06: qty 15, 30d supply, fill #1
  Filled 2022-03-11: qty 15, 30d supply, fill #2
  Filled 2022-04-10: qty 15, 30d supply, fill #3
  Filled 2022-05-13: qty 15, 30d supply, fill #4
  Filled 2022-06-20 (×2): qty 15, 30d supply, fill #5
  Filled 2022-07-23: qty 15, 30d supply, fill #6

## 2021-09-04 NOTE — Patient Instructions (Addendum)
[place]in de seguridad: preste atencin a su cuerpo durante los movimientos; si algo duele o causa dolor, detngase de inmediato. Y consulte primero con su mdico antes de comenzar este o cualquier programa de ejercicios.   Crculos de brazo de sol [employer] en una silla con buena postura, sostenga una pelota con ambas manos con los brazos extendidos [employer] su [place] y/o frente a usted, manteniendo los codos ligeramente flexionados. Visualizante la esfera de un reloj frente a usted, comience levantando los brazos por encima de la cabeza a las 12 en punto. Haz crculos alrededor de la pelota para dar la vuelta al reloj de forma controlada y movimiento fluido. Cuando haya llegado a las 12 en punto nuevamente, invierta las direcciones y Hydrologist en un crculo el opuesto camino. Siga alternando las direcciones de los crculos durante 8 repeticiones. Descansar. Haz otra serie de 8 repeticiones Modificacin: No se requiere una pelota para este ejercicio. Imagina que eres sosteniendo una pelota mientras realiza el [place]. Si es difcil llevar los brazos arriba, extindalos frente a usted y [employer] brazos como si dibujara un crculo en la pared con o sin baln.   Giros de [employer] en una silla con buena postura, sostenga una pelota con ambas manos cerca del cuerpo, con los codos doblados y tirados cerca de la caja torcica. Gire lentamente el torso [employer] derecha tanto como pueda cmodamente, asegurndose de [place] el resto de su cuerpo quieto y [place]. Gire de nuevo al centro y repita en la direccin opuesta. Haz esto 8 veces, con dos giros contando como un juego completo. Descansar. Haz otras 8 series (dos giros cada [place]). Modificacin: No se requiere una pelota para este ejercicio. Imagina que ests sosteniendo un pelota mientras realiza el [place], o sostenga un objeto pequeo como una lata de sopa o botella de agua para aadir resistencia    Prensa de pecho con  pelota Sentado en una silla con buena postura, sostenga una pelota con ambas manos al nivel del pecho, palmas enfrentadas y codos flexionados. Evite inclinarse hacia adelante manteniendo los hombros hacia atrs en todo momento. Aprieta la pelota ligeramente mientras empujas el aleje la pelota de usted con un movimiento fluido, tardando unos 2 segundos en [employer]. Aprieta los omplatos mientras tiras de la pelota hacia tu pecho. Repita el movimiento de empujar y jalar de 10 a 15 veces. Descansar. Haz otra serie de 10 a 15 repeticiones Modificacin: para un mayor desafo, agregue una sensacin de Tai Chi parndose con una pierna ligeramente delante del otro (con una silla cerca si es necesario para mantener el equilibrio) y balanceando lentamente todo el cuerpo hacia adelante y [place] atrs mientras empuja la pelota y retrocede. Elevaciones de brazos frontales En una posicin sentada con una buena postura, sostenga una pelota con ambas manos con las palmas hacia El uno al [place]. Extienda los brazos frente a su cuerpo, manteniendo los codos Ligeramente doblado. Comenzando con la pelota [employer] rodillas, levante lentamente la brazos para levantar la pelota hasta el nivel de los hombros (no ms arriba), luego baje la pelota de nuevo a la posicin inicial, tomando alrededor de 2 a 3 segundos para subir y Sports coach. Repita de 10 a 15 veces. Descansar. Haz otra serie de 10 a 15 repeticiones. Modificacin: No se requiere una pelota para este ejercicio. Imagina que ests sosteniendo un pelota mientras realiza el [place], o sostenga un objeto pequeo, como una lata de sopa o botella de agua para mayor resistencia.  Apretones internos del muslo Sentado hacia el borde de una silla con una buena postura y las rodillas dobladas, coloque una pelota en entre tus rodillas; presione las rodillas juntas para apretar la pelota, tomando alrededor de 1 a 2 segundos para apretar. Debes  sentir la resistencia en la parte interna de tus muslos. Despacio soltar manteniendo una ligera tensin en la bola para que no se caiga. Repita de 8 a 10 veces. Descansar. Haz otra serie de 8 a 10 repeticiones. Modificacin: Para un desafo mayor, cambia el conteo de los apretones por apretando la pelota y mantenindola durante 5 segundos, luego soltndola nuevamente. O haga apretones breves y rpidos. Extensiones de rodilla Sentado hacia el borde de una silla con una buena postura y las rodillas dobladas, agrrese del lados de la silla con las manos. Extienda la rodilla derecha hacia afuera para que los dedos de los pies vengan hacia el techo, asegurndose de mantener la rodilla ligeramente doblada sin bloquearla a travs de todo el movimiento. Baje la pierna de nuevo a una posicin doblada y repita esto movimiento 8 a 10 veces, usando aproximadamente 2 segundos cada uno para levantar y bajar la pierna. Cambie a la pierna opuesta y realice de 8 a 10 repeticiones. Descansa brevemente. Hacer otra serie de 8 a 10 repeticiones para cada pierna. Modificacin: Si eres ms avanzado, sentado en la misma posicin que el anterior, extiende una pierna frente a ti con los dedos de los pies apuntando hacia el techo. Subir y bajar toda la pierna tan alto como le resulte cmodo, manteniendo la rodilla ligeramente flexionada. La palanca ms larga aade dificultad al ejercicio.    Codo a Rodilla Sentado hacia el borde de una silla con una buena postura y las rodillas dobladas, comience con su brazo derecho extendido hacia arriba. Lentamente levante la rodilla izquierda mientras baja la codo derecho hacia abajo hacia la rodilla izquierda, tardando unos 2 segundos en bajar. Trate de no doblarse por la cintura. Soltar y volver a la posicin inicial. Repita de 8 a 10 veces. Cambie de lado y haga de 8 a 10 repeticiones, tirando de un codo hacia la rodilla opuesta. Descansar. Haz otra serie de 8 a 10 repeticiones  de cada lado. Modificacin: Pruebe este ejercicio (con una silla cerca para mantener el equilibrio) de pie posicin para un mayor rango de movimiento.      Extensiones de brazo por encima de la cabeza Sentado en una silla con buena postura, sostenga una pelota con ambas manos y levntela por encima tu cabeza, con los brazos extendidos sin bloquear los codos. manteniendo los codos empujado hacia la cabeza, doble lentamente los codos para bajar la pelota a lo largo de la parte posterior del cuello, usando aproximadamente 2 segundos para bajar, luego 2 segundos para empujar la pelota una copia de seguridad sobre su cabeza. Repita de 8 a 10 veces. Descansar. Haz otra serie de 8 a 10 repeticiones. Modificacin: Pruebe las extensiones de trceps sentado (no se requiere pelota para esto). modificacin). Inclinndose ligeramente hacia adelante con los codos pegados a los costados, lentamente extienda los codos para que sus antebrazos retrocedan detrs de usted, manteniendo la codos tirados hacia arriba y hacia adentro durante todo el movimiento. Regresa a la posicion inicial y repite. Sostenga latas de sopa o pesas pequeas para mayor resistencia. 
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
## 2021-09-05 ENCOUNTER — Other Ambulatory Visit (INDEPENDENT_AMBULATORY_CARE_PROVIDER_SITE_OTHER): Payer: Self-pay | Admitting: Primary Care

## 2021-09-05 ENCOUNTER — Other Ambulatory Visit: Payer: Self-pay

## 2021-09-05 ENCOUNTER — Ambulatory Visit: Payer: No Typology Code available for payment source | Attending: Family Medicine

## 2021-09-05 DIAGNOSIS — E782 Mixed hyperlipidemia: Secondary | ICD-10-CM

## 2021-09-05 LAB — CMP14+EGFR
ALT: 21 IU/L (ref 0–32)
AST: 24 IU/L (ref 0–40)
Albumin/Globulin Ratio: 1.6 (ref 1.2–2.2)
Albumin: 4.2 g/dL (ref 3.7–4.7)
Alkaline Phosphatase: 77 IU/L (ref 44–121)
BUN/Creatinine Ratio: 37 — ABNORMAL HIGH (ref 12–28)
BUN: 23 mg/dL (ref 8–27)
Bilirubin Total: 0.4 mg/dL (ref 0.0–1.2)
CO2: 24 mmol/L (ref 20–29)
Calcium: 10.1 mg/dL (ref 8.7–10.3)
Chloride: 103 mmol/L (ref 96–106)
Creatinine, Ser: 0.62 mg/dL (ref 0.57–1.00)
Globulin, Total: 2.7 g/dL (ref 1.5–4.5)
Glucose: 86 mg/dL (ref 65–99)
Potassium: 3.8 mmol/L (ref 3.5–5.2)
Sodium: 143 mmol/L (ref 134–144)
Total Protein: 6.9 g/dL (ref 6.0–8.5)
eGFR: 95 mL/min/{1.73_m2} (ref >59)

## 2021-09-05 LAB — LIPID PANEL
Chol/HDL Ratio: 3.7 ratio (ref 0.0–4.4)
Cholesterol, Total: 241 mg/dL — ABNORMAL HIGH (ref 100–199)
HDL: 65 mg/dL (ref >39)
LDL Chol Calc (NIH): 145 mg/dL — ABNORMAL HIGH (ref 0–99)
Triglycerides: 177 mg/dL — ABNORMAL HIGH (ref 0–149)
VLDL Cholesterol Cal: 31 mg/dL (ref 5–40)

## 2021-09-05 MED ORDER — ATORVASTATIN CALCIUM 40 MG PO TABS
40.0000 mg | ORAL_TABLET | Freq: Every day | ORAL | 3 refills | Status: DC
  Filled 2021-09-05: qty 30, 30d supply, fill #0
  Filled 2021-10-23: qty 30, 30d supply, fill #1
  Filled 2021-11-23: qty 30, 30d supply, fill #2
  Filled 2021-12-31: qty 30, 30d supply, fill #0
  Filled 2022-02-06: qty 30, 30d supply, fill #1
  Filled 2022-03-11: qty 30, 30d supply, fill #2
  Filled 2022-04-10: qty 30, 30d supply, fill #3
  Filled 2022-05-13: qty 30, 30d supply, fill #4
  Filled 2022-06-20: qty 30, 30d supply, fill #5

## 2021-09-07 ENCOUNTER — Telehealth (INDEPENDENT_AMBULATORY_CARE_PROVIDER_SITE_OTHER): Payer: Self-pay

## 2021-09-07 NOTE — Telephone Encounter (Signed)
Contacted patient with assistance of pacific interpreter [phone]) patient was unavailable. Per DPR results provided to claudia. She will inform patient. Maryjean Morn, CMA

## 2021-09-07 NOTE — Telephone Encounter (Signed)
-----   Message from Grayce Sessions, NP sent at 09/05/2021  2:43 PM EDT ----- Your cholesterol is still high, but continued recommendations to make lifestyle changes. Your LDL is above normal. The LDL is the bad cholesterol. Over time and in combination with inflammation and other factors, this contributes to plaque which in turn may lead to stroke and/or heart attack down the road. Sometimes high LDL is primarily genetic, and people might be eating all the right foods but still have high numbers. Other times, there is room for improvement in one's diet and eating healthier can bring this number down and potentially reduce one's risk of heart attack and/or stroke.   To reduce your LDL, Remember - more fruits and vegetables, more fish, and limit red meat and dairy products. More soy, nuts, beans, barley, lentils, oats and plant sterol ester enriched margarine instead of butter. I also encourage eliminating sugar and processed food. Remember, shop on the outside of the grocery store and visit your [employer]. If you would like to talk with me about dietary changes plus or minus medications for your cholesterol, please let me know. We should recheck your cholesterol in 3-6 months. sent in atorvastatin 40mg

## 2021-09-11 ENCOUNTER — Other Ambulatory Visit: Payer: Self-pay

## 2021-09-12 ENCOUNTER — Other Ambulatory Visit: Payer: Self-pay

## 2021-10-17 ENCOUNTER — Other Ambulatory Visit (HOSPITAL_COMMUNITY): Payer: Self-pay

## 2021-10-22 ENCOUNTER — Ambulatory Visit: Payer: No Typology Code available for payment source

## 2021-10-23 ENCOUNTER — Ambulatory Visit: Payer: No Typology Code available for payment source | Attending: Primary Care

## 2021-10-23 ENCOUNTER — Other Ambulatory Visit: Payer: Self-pay

## 2021-11-23 ENCOUNTER — Other Ambulatory Visit: Payer: Self-pay

## 2021-12-31 ENCOUNTER — Other Ambulatory Visit: Payer: Self-pay

## 2022-02-06 ENCOUNTER — Other Ambulatory Visit: Payer: Self-pay

## 2022-02-07 ENCOUNTER — Other Ambulatory Visit: Payer: Self-pay

## 2022-03-11 ENCOUNTER — Other Ambulatory Visit: Payer: Self-pay

## 2022-04-02 ENCOUNTER — Other Ambulatory Visit: Payer: Self-pay

## 2022-04-02 DIAGNOSIS — Z1231 Encounter for screening mammogram for malignant neoplasm of breast: Secondary | ICD-10-CM

## 2022-04-04 ENCOUNTER — Ambulatory Visit
Admission: RE | Admit: 2022-04-04 | Discharge: 2022-04-04 | Disposition: A | Discharge status: Home / Self Care | Payer: No Typology Code available for payment source | Source: Ambulatory Visit | Attending: Primary Care | Admitting: Primary Care

## 2022-04-04 DIAGNOSIS — Z1231 Encounter for screening mammogram for malignant neoplasm of breast: Secondary | ICD-10-CM

## 2022-04-10 ENCOUNTER — Other Ambulatory Visit: Payer: Self-pay

## 2022-04-18 ENCOUNTER — Other Ambulatory Visit: Payer: Self-pay

## 2022-04-18 ENCOUNTER — Encounter: Payer: Self-pay | Admitting: Physician Assistant

## 2022-04-18 ENCOUNTER — Ambulatory Visit (INDEPENDENT_AMBULATORY_CARE_PROVIDER_SITE_OTHER): Payer: Self-pay | Admitting: Physician Assistant

## 2022-04-18 DIAGNOSIS — B9689 Other specified bacterial agents as the cause of diseases classified elsewhere: Secondary | ICD-10-CM

## 2022-04-18 DIAGNOSIS — J208 Acute bronchitis due to other specified organisms: Secondary | ICD-10-CM

## 2022-04-18 MED ORDER — AZITHROMYCIN 250 MG PO TABS
ORAL_TABLET | ORAL | 0 refills | Status: DC
Start: 2022-04-18 — End: 2022-12-10
  Filled 2022-04-18: qty 6, 5d supply, fill #0

## 2022-04-18 MED ORDER — BENZONATATE 100 MG PO CAPS
100.0000 mg | ORAL_CAPSULE | Freq: Two times a day (BID) | ORAL | 0 refills | Status: DC | PRN
Start: 2022-04-18 — End: 2022-12-10
  Filled 2022-04-18: qty 20, 10d supply, fill #0
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
## 2022-04-18 NOTE — Progress Notes (Signed)
Patient reports sore throat and cough beginning a week, Patient denies N/V/Diarrhea. Patient reports productive cough with clear with tinges of green color mucous and runny nose with clear mucous.  ?Patient denies any other persons with Sx. ?Patient has not used any medication only drinking tea and doing nasal rinses. ?Patient has taken medication today. ?Patient has eaten today. ?Patient reports HA from coughing keeping her up at night. ? ?

## 2022-05-13 ENCOUNTER — Other Ambulatory Visit: Payer: Self-pay

## 2022-05-14 ENCOUNTER — Other Ambulatory Visit: Payer: Self-pay

## 2022-06-20 ENCOUNTER — Other Ambulatory Visit: Payer: Self-pay

## 2022-07-23 ENCOUNTER — Encounter (INDEPENDENT_AMBULATORY_CARE_PROVIDER_SITE_OTHER): Payer: Self-pay | Admitting: Primary Care

## 2022-07-23 ENCOUNTER — Ambulatory Visit (INDEPENDENT_AMBULATORY_CARE_PROVIDER_SITE_OTHER): Payer: Self-pay | Admitting: *Deleted

## 2022-07-23 ENCOUNTER — Telehealth (INDEPENDENT_AMBULATORY_CARE_PROVIDER_SITE_OTHER): Payer: Self-pay | Admitting: Primary Care

## 2022-07-23 ENCOUNTER — Ambulatory Visit (INDEPENDENT_AMBULATORY_CARE_PROVIDER_SITE_OTHER): Payer: Self-pay | Admitting: Primary Care

## 2022-07-23 ENCOUNTER — Other Ambulatory Visit: Payer: Self-pay

## 2022-07-23 VITALS — BP 131/74 | HR 60 | Temp 98.1°F | Ht 62.0 in | Wt 188.0 lb

## 2022-07-23 DIAGNOSIS — I10 Essential (primary) hypertension: Secondary | ICD-10-CM

## 2022-07-23 DIAGNOSIS — M2550 Pain in unspecified joint: Secondary | ICD-10-CM

## 2022-07-23 DIAGNOSIS — E785 Hyperlipidemia, unspecified: Secondary | ICD-10-CM

## 2022-07-23 DIAGNOSIS — R42 Dizziness and giddiness: Secondary | ICD-10-CM

## 2022-07-23 MED ORDER — LISINOPRIL-HYDROCHLOROTHIAZIDE 20-25 MG PO TABS
1.0000 | ORAL_TABLET | Freq: Every day | ORAL | 3 refills | Status: DC
Start: 2022-07-23 — End: 2022-09-04
  Filled 2022-07-23: qty 30, 30d supply, fill #0

## 2022-07-23 MED ORDER — METOPROLOL SUCCINATE ER 25 MG PO TB24
ORAL_TABLET | Freq: Every day | ORAL | 1 refills | Status: DC
Start: 2022-07-23 — End: 2022-09-04
  Filled 2022-07-23: qty 60, 30d supply, fill #0

## 2022-07-23 MED ORDER — DICLOFENAC SODIUM 1 % EX GEL
2.0000 g | Freq: Four times a day (QID) | CUTANEOUS | 1 refills | Status: DC
Start: 2022-07-23 — End: 2022-09-04
  Filled 2022-07-23: qty 100, 12d supply, fill #0

## 2022-07-23 NOTE — Progress Notes (Signed)
[facility]  [name] [name], is a 71 y.o. female  CHY:[id number]  INO:[id number]  DOB - [birth year]  Chief Complaint  Patient presents with   Follow-up    Bones, brain, back pain, dizziness, restless,        Subjective:   [name] [name] is a 71 y.o. Hispanic female( interpreter Wilber Oliphant [phone]) patient gave friend permission to be with her at this appt .Cladia, she is here today for entire body aches. Patient has No headache, No chest pain, No abdominal pain - No Nausea, No new weakness tingling or numbness, No Cough - shortness of breath  No problems updated.  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     atorvastatin (LIPITOR) 40 MG tablet Take 1 tablet (40 mg total) by mouth daily. 90 tablet 3   azithromycin (ZITHROMAX) 250 MG tablet Take 2 tabs by mouth day 1, then take 1 tab by mouth once daily 6 tablet 0   benzonatate (TESSALON) 100 MG capsule Take 1 capsule (100 mg total) by mouth 2 (two) times daily as needed for cough. 20 capsule 0   No current facility-administered medications on file prior to visit.   Comprehensive ROS Pertinent positive and negative noted in HPI   Objective:   Vitals:   07/23/22 1424  BP: 131/74  Pulse: 60  Temp: 98.1 F (36.7 C)  TempSrc: Oral  SpO2: 99%  Weight: 188 lb (85.3 kg)  Height: 5\' 2"  (1.575 m)    Exam General  appearance : Awake, alert, mild distress. Speech Clear. Not toxic looking HEENT: Atraumatic and Normocephalic, pupils equally reactive to light and accomodation Neck: Supple, no JVD. No cervical lymphadenopathy.  Chest: Good air entry bilaterally, no added sounds  CVS: S1 S2 regular, no murmurs.  Abdomen: Bowel sounds present, Non tender and not distended with no gaurding, rigidity or rebound. Extremities: B/L Lower Ext shows no edema, both legs are warm to touch Neurology: Awake alert, and oriented X 3, Non focal Skin: No Rash  Data Review Lab Results  Component Value Date   HGBA1C 5.4 01/28/2018   HGBA1C 5.6 11/08/2013    Assessment & Plan  Jull was seen today for follow-up.  Diagnoses and all orders for this visit:  Arthralgia, unspecified joint Information provided on AVS -     diclofenac Sodium (VOLTAREN) 1 % GEL; Apply 2 g topically 4 (four) times daily.  Essential hypertension Well controlled <140/90  Explained that having normal blood pressure is the goal and medications are helping to get to goal and maintain normal blood pressure. DIET: Limit salt intake, read nutrition labels to check salt content, limit fried and high fatty foods  Avoid using multisymptom OTC cold preparations that generally contain sudafed which can rise BP. Consult with pharmacist on best cold relief products to  use for persons with HTN EXERCISE Discussed incorporating exercise such as walking - 30 minutes most days of the week and can do in 10 minute intervals    -     lisinopril-hydrochlorothiazide (ZESTORETIC) 20-25 MG tablet; TAKE 1 TABLET BY MOUTH DAILY. -     metoprolol succinate (TOPROL-XL) 25 MG 24 hr tablet; TAKE 0.5 TABLETS (12.5 MG TOTAL) BY MOUTH DAILY.    Patient have been counseled extensively about nutrition and exercise. Other issues discussed during this visit include: low cholesterol diet, weight control and daily exercise, foot care, annual eye examinations at [facility],  importance of adherence with medications and regular follow-up. We also discussed long term complications of uncontrolled diabetes and hypertension.   Return in about 6 months (around 01/23/2023) for Bp.  The patient was given clear instructions to go to ER or return to medical center if symptoms don't improve, worsen or new problems develop. The patient verbalized understanding. The patient was told to call to get lab results if they haven't heard anything in the next week.   This note has been created with Education officer, environmental. Any transcriptional errors are unintentional.   Grayce Sessions, NP 07/30/2022, 1:48 PM

## 2022-07-23 NOTE — Telephone Encounter (Signed)
Copied from CRM [id number]. Topic: General - Inquiry >> Jul 23, 2022  1:32 PM De Blanch wrote: Reason for CRM: Pt is requesting to have her care transferred to [facility]. Pt stated it is very hard to get an appointment at [facility], and her husband goes to [facility], and she would like to seek care there after today.  Please advise.

## 2022-07-23 NOTE — Patient Instructions (Signed)
Dolor en las articulaciones Joint Pain  Varias pueden ser las causas del dolor en las articulaciones. Es probable que desaparezca si sigue las indicaciones del mdico para Public house manager. A veces, es posible que necesite ms tratamiento. Siga estas instrucciones en su casa: Control del dolor, la rigidez y la hinchazn     Si se lo indican, aplique hielo sobre la zona dolorida. Para hacer esto: Si tiene IT consultant, un cabestrillo o una frula desmontable, quteselos segn las indicaciones del mdico. Ponga el hielo en una bolsa plstica. Coloque una toalla entre la piel y Copy. Aplique el hielo durante 20 minutos, 2 o 3 veces por da. Retire el hielo si la piel se le pone de color rojo brillante. Esto es [employer]. Si no puede sentir dolor, calor o fro, tiene un mayor riesgo de que se dae la zona. Mueva con frecuencia los dedos de la mano o del pie que se encuentran por debajo de la articulacin con dolor. Cuando est sentado o acostado, levante la articulacin con dolor por encima del nivel del corazn. Si se lo indican, aplique calor sobre la zona dolorida. Hgalo con la frecuencia que le haya indicado el mdico. Use la fuente de calor que el mdico le recomiende, como una compresa de calor hmedo o una almohadilla trmica. Coloque una toalla entre la piel y la fuente de Airline pilot. Aplique calor durante 20 a 30 minutos. Quite la fuente de calor si la piel se pone de color rojo brillante. Esto es especialmente importante si no puede sentir dolor, calor o fro. Puede correr un riesgo mayor de sufrir quemaduras. Actividad Descanse la articulacin con dolor durante el tiempo que le indique el mdico. No haga cosas que le causen dolor o que lo intensifiquen. Comience a ejercitar o estirar la zona afectada como se lo haya indicado el mdico. Pregntele al mdico qu tipos de ejercicios son seguros para usted. Retome sus actividades normales cuando el mdico le diga que es  seguro. Si tiene un vendaje elstico, cabestrillo o frula: selos como se lo haya indicado el mdico. Quteselos solamente como se lo haya indicado el mdico. Afljelos si los dedos de la mano o del pie que se encuentran debajo de la articulacin: Hormiguean. Se adormecen. Se enfran y se ponen de color azul. Mantngalos limpios. Pregntele al mdico si debera quitrselos antes de baarse. Si no son impermeables: No deje que se mojen. Cbralos con un envoltorio hermtico cuando tome un bao de inmersin o una ducha. Indicaciones generales Use los medicamentos de venta libre y los recetados solamente como se lo haya indicado el mdico. Esto puede incluir medicamentos que se toman por boca o se aplican en la piel. No fume ni consuma ningn producto que contenga nicotina o tabaco. Si necesita ayuda para dejar de consumir estos productos, consulte al mdico. Concurra a todas las visitas de seguimiento como se lo haya indicado el mdico. Esto es importante. Comunquese con un mdico si: Tiene un dolor que empeora y no mejora con los medicamentos. El dolor en la articulacin no mejora en el trmino de 3 das. Tiene ms hinchazn o moretones. Tiene fiebre. Pierde 10 libras (4,5 kg) o ms, sin proponrselo. Solicite ayuda de inmediato si: No puede Surveyor, minerals. Si los dedos de la mano o del pie se le entumecen, si siente hormigueo o si se le enfran y se tornan de Research officer, trade union. Tiene fiebre y [place] articulacin que est roja e inflamada, y se siente caliente  al tacto. Resumen Varias pueden ser las causas del dolor en las articulaciones. A menudo desaparece si sigue las indicaciones del mdico para Public house manager. Descanse la articulacin con dolor durante el tiempo que le indiquen. No haga cosas que le causen dolor o que lo intensifiquen. Use los medicamentos de venta libre y los recetados solamente como se lo haya indicado el mdico. Esta informacin no tiene Theme park manager  el consejo del mdico. Asegrese de hacerle al mdico cualquier pregunta que tenga. Document Revised: 06/19/2020 Document Reviewed: 06/19/2020 Elsevier Patient Education  2023 [employer].

## 2022-07-23 NOTE — Telephone Encounter (Signed)
  Chief Complaint: Fatigue Symptoms: told agent also HA, did not tell me about that symptoms Frequency: has had for many months, she is not sure Pertinent Negatives: Patient denies vomiting, diarrhea, no new meds Disposition: [] ED /[] Urgent Care (no appt availability in office) / [x] Appointment(In office/virtual)/ []  [facility]/ [] Home Care/ [] Refused Recommended Disposition /[] [facility]/ []  Follow-up with PCP Additional Notes:  Interpreter, , assisting with call. Pt already has appt for this afternoon. Put through to NT to make sure that is correct disposition for pt. Pt states this has been for months, no cause known. States she can walk unassisted. She is speaking in long phrases without SOB. Has a neighbor that will bring her to the appt.  Reason for Disposition  [1] MODERATE weakness (i.e., interferes with work, school, normal activities) AND [2] cause unknown  (Exceptions: Weakness from acute minor illness or poor fluid intake; weakness is chronic and not worse.)  Answer Assessment - Initial Assessment Questions 1. DESCRIPTION: "Describe how you are feeling."     Feeling tired for a long time, unsure how long 2. SEVERITY: "How bad is it?"  "Can you stand and walk?"   - MILD (0-3): Feels weak or tired, but does not interfere with work, school or normal activities.   - MODERATE (4-7): Able to stand and walk; weakness interferes with work, school, or normal activities.   - SEVERE (8-10): Unable to stand or walk; unable to do usual activities.     Gets tired, sometimes takes naps. 3. ONSET: "When did these symptoms begin?" (e.g., hours, days, weeks, months)     Can not remember but it was months ago 4. CAUSE: "What do you think is causing the weakness or fatigue?" (e.g., not drinking enough fluids, medical problem, trouble sleeping)     Unsure, fatigued and bones hurt 5. NEW MEDICINES:  "Have you started on any new medicines recently?" (e.g., opioid pain  medicines, benzodiazepines, muscle relaxants, antidepressants, antihistamines, neuroleptics, beta blockers)     No new meds 6. OTHER SYMPTOMS: "Do you have any other symptoms?" (e.g., chest pain, fever, cough, SOB, vomiting, diarrhea, bleeding, other areas of pain)     No, talking in long phrases without difficulty. 7. PREGNANCY: "Is there any chance you are pregnant?" "When was your last menstrual period?"     na  Protocols used: Weakness (Generalized) and Fatigue-A-AH

## 2022-07-30 ENCOUNTER — Encounter (INDEPENDENT_AMBULATORY_CARE_PROVIDER_SITE_OTHER): Payer: Self-pay | Admitting: Primary Care

## 2022-09-04 ENCOUNTER — Ambulatory Visit: Payer: Self-pay | Attending: Family Medicine | Admitting: Family Medicine

## 2022-09-04 ENCOUNTER — Other Ambulatory Visit: Payer: Self-pay

## 2022-09-04 ENCOUNTER — Encounter: Payer: Self-pay | Admitting: Family Medicine

## 2022-09-04 VITALS — BP 133/81 | HR 60 | Temp 99.1°F | Ht 62.0 in | Wt 188.2 lb

## 2022-09-04 DIAGNOSIS — Z131 Encounter for screening for diabetes mellitus: Secondary | ICD-10-CM

## 2022-09-04 DIAGNOSIS — I4891 Unspecified atrial fibrillation: Secondary | ICD-10-CM

## 2022-09-04 DIAGNOSIS — E7800 Pure hypercholesterolemia, unspecified: Secondary | ICD-10-CM

## 2022-09-04 DIAGNOSIS — I10 Essential (primary) hypertension: Secondary | ICD-10-CM

## 2022-09-04 DIAGNOSIS — Z23 Encounter for immunization: Secondary | ICD-10-CM

## 2022-09-04 DIAGNOSIS — M19072 Primary osteoarthritis, left ankle and foot: Secondary | ICD-10-CM

## 2022-09-04 MED ORDER — LISINOPRIL-HYDROCHLOROTHIAZIDE 20-25 MG PO TABS
1.0000 | ORAL_TABLET | Freq: Every day | ORAL | 1 refills | Status: DC
Start: 2022-09-04 — End: 2023-02-19
  Filled 2022-09-04: qty 30, 30d supply, fill #0
  Filled 2022-10-09: qty 30, 30d supply, fill #1
  Filled 2022-11-26: qty 30, 30d supply, fill #2
  Filled 2023-01-08: qty 30, 30d supply, fill #3

## 2022-09-04 MED ORDER — ASPIRIN 81 MG PO TBEC: 81.0000 mg | DELAYED_RELEASE_TABLET | Freq: Every day | ORAL | 12 refills | Status: AC

## 2022-09-04 MED ORDER — DICLOFENAC SODIUM 1 % EX GEL
2.0000 g | Freq: Four times a day (QID) | CUTANEOUS | 1 refills | Status: DC
  Filled 2022-09-04: qty 100, 12d supply, fill #0
  Filled 2022-10-09: qty 100, 12d supply, fill #1

## 2022-09-04 MED ORDER — METOPROLOL SUCCINATE ER 25 MG PO TB24
ORAL_TABLET | Freq: Every day | ORAL | 1 refills | Status: DC
  Filled 2022-09-04: qty 15, 30d supply, fill #0
  Filled 2022-10-09: qty 15, 30d supply, fill #1

## 2022-09-04 NOTE — Patient Instructions (Signed)
Dolor en el tobillo Ankle Pain La articulacin del tobillo mantiene el peso del cuerpo y le permite moverse. El dolor puede ocurrir en la parte interna, externa o posterior de uno o ambos tobillos. Puede sentir un dolor agudo que causa sensacin de ardor, o puede ser un dolor sordo y [place]. Puede haber sensibilidad al tacto, rigidez, enrojecimiento o sensacin de calor alrededor del tobillo. Existen muchos factores causantes del dolor de [place], entre ellos, una lesin en la zona y el uso excesivo del tobillo. Siga estas instrucciones en su casa: Actividad Ponga el tobillo en reposo como se lo haya indicado el mdico. Evite las actividades que causan el dolor en el tobillo. No apoye el peso del cuerpo Intel extremidad [employer] que lo autorice el mdico. Use las [employer] se lo haya indicado el mdico. Haga ejercicio como se lo haya indicado el mdico. Pregntele al mdico cundo puede volver a conducir si tiene un dispositivo ortopdico en el tobillo. Si tiene un aparato ortopdico: selo como se lo haya indicado el mdico. Quteselo solamente como se lo haya indicado el mdico. Afljelo si los dedos de los pies se le adormecen, siente hormigueos o se le enfran y se tornan de Research officer, trade union. Mantenga limpio el dispositivo ortopdico. Si el dispositivo ortopdico no es impermeable: No deje que se moje. Cbralo con un envoltorio hermtico cuando tome un bao de inmersin o una ducha. Si le pusieron una venda elstica:  Qutesela para baarse. Trate de no mover mucho el tobillo, pero mueva los dedos del pie de vez en cuando. Esto ayuda a evitar la hinchazn. Si siente que la venda est muy [place], puede aflojarla un poco para estar ms cmodo. Afloje la venda si tiene adormecimiento u hormigueo en el pie o si lo siente fro y est de [employer]. Control del dolor, la rigidez y la hinchazn  Si se lo indican, aplique hielo sobre la zona dolorida. Si tiene un dispositivo ortopdico  desmontable o una venda elstica, quteselo como se lo haya indicado el mdico. Ponga el hielo en una bolsa plstica. Coloque una toalla entre la piel y Copy. Aplique el hielo durante 20 minutos, 2 a 3 veces por da. Mueva los dedos del pie con frecuencia para evitar que se pongan rgidos y para reducir la hinchazn. Cuando est sentado o acostado, levante (eleve) el tobillo por encima del nivel del corazn. Indicaciones generales Registre la informacin acerca de su dolor. Escribir lo siguiente puede ser til para usted y para el mdico: Con qu frecuencia le duele el tobillo. La ubicacin del dolor. Cmo se siente el dolor. Si el tratamiento supone el uso de plantillas o calzado recetado, asegrese de usarlo correctamente y durante todo el tiempo indicado por el mdico. Use los medicamentos de venta libre y los recetados solamente como se lo haya indicado el mdico. Oceanographer a todas las visitas de seguimiento como se lo haya indicado el mdico. Esto es importante. Comunquese con un mdico si: El Product/process development scientist. El dolor no se alivia con los [employer]. Tiene fiebre o escalofros. Tiene ms dificultad para caminar. Aparecen nuevos sntomas. Solicite ayuda de inmediato si: El pie, la pierna, los dedos de los pies o el tobillo: Presentan hormigueo o se adormecen. Se hinchan. Se ponen plido o de color azul. Resumen El dolor puede ocurrir en la parte interna, externa o posterior de uno o ambos tobillos. Puede sentir un dolor agudo que causa sensacin de ardor, o puede ser un dolor sordo y  molesto. Ponga el tobillo en reposo como se lo haya indicado el mdico. Si se lo indican, aplique hielo sobre la zona. Use los medicamentos de venta libre y los recetados solamente como se lo haya indicado el mdico. Esta informacin no tiene Theme park manager el consejo del mdico. Asegrese de hacerle al mdico cualquier pregunta que tenga. Document Revised: 02/23/2021 Document Reviewed:  02/23/2021 Elsevier Patient Education  2023 [employer].

## 2022-09-04 NOTE — Progress Notes (Signed)
Medication refills
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
## 2022-09-05 ENCOUNTER — Ambulatory Visit: Payer: Self-pay | Attending: Physician Assistant

## 2022-09-05 DIAGNOSIS — Z131 Encounter for screening for diabetes mellitus: Secondary | ICD-10-CM

## 2022-09-05 DIAGNOSIS — I10 Essential (primary) hypertension: Secondary | ICD-10-CM

## 2022-09-06 ENCOUNTER — Other Ambulatory Visit: Payer: Self-pay | Admitting: Family Medicine

## 2022-09-06 ENCOUNTER — Other Ambulatory Visit: Payer: Self-pay

## 2022-09-06 LAB — CMP14+EGFR
ALT: 18 IU/L (ref 0–32)
AST: 21 IU/L (ref 0–40)
Albumin/Globulin Ratio: 1.6 (ref 1.2–2.2)
Albumin: 4.3 g/dL (ref 3.8–4.8)
Alkaline Phosphatase: 97 IU/L (ref 44–121)
BUN/Creatinine Ratio: 31 — ABNORMAL HIGH (ref 12–28)
BUN: 18 mg/dL (ref 8–27)
Bilirubin Total: 0.8 mg/dL (ref 0.0–1.2)
CO2: 24 mmol/L (ref 20–29)
Calcium: 10 mg/dL (ref 8.7–10.3)
Chloride: 102 mmol/L (ref 96–106)
Creatinine, Ser: 0.59 mg/dL (ref 0.57–1.00)
Globulin, Total: 2.7 g/dL (ref 1.5–4.5)
Glucose: 101 mg/dL — ABNORMAL HIGH (ref 70–99)
Potassium: 4.7 mmol/L (ref 3.5–5.2)
Sodium: 140 mmol/L (ref 134–144)
Total Protein: 7 g/dL (ref 6.0–8.5)
eGFR: 95 mL/min/{1.73_m2} (ref >59)

## 2022-09-06 LAB — LP+NON-HDL CHOLESTEROL
Cholesterol, Total: 237 mg/dL — ABNORMAL HIGH (ref 100–199)
HDL: 64 mg/dL (ref >39)
LDL Chol Calc (NIH): 157 mg/dL — ABNORMAL HIGH (ref 0–99)
Total Non-HDL-Chol (LDL+VLDL): 173 mg/dL — ABNORMAL HIGH (ref 0–129)
Triglycerides: 90 mg/dL (ref 0–149)
VLDL Cholesterol Cal: 16 mg/dL (ref 5–40)

## 2022-09-06 LAB — CBC WITH DIFFERENTIAL/PLATELET
Basophils Absolute: 0 10*3/uL (ref 0.0–0.2)
Basos: 0 %
EOS (ABSOLUTE): 0.2 10*3/uL (ref 0.0–0.4)
Eos: 3 %
Hematocrit: 39.2 % (ref 34.0–46.6)
Hemoglobin: 13.4 g/dL (ref 11.1–15.9)
Immature Grans (Abs): 0 10*3/uL (ref 0.0–0.1)
Immature Granulocytes: 0 %
Lymphocytes Absolute: 0.9 10*3/uL (ref 0.7–3.1)
Lymphs: 17 %
MCH: 32.4 pg (ref 26.6–33.0)
MCHC: 34.2 g/dL (ref 31.5–35.7)
MCV: 95 fL (ref 79–97)
Monocytes Absolute: 0.4 10*3/uL (ref 0.1–0.9)
Monocytes: 8 %
Neutrophils Absolute: 3.5 10*3/uL (ref 1.4–7.0)
Neutrophils: 72 %
Platelets: 285 10*3/uL (ref 150–450)
RBC: 4.14 x10E6/uL (ref 3.77–5.28)
RDW: 12.3 % (ref 11.7–15.4)
WBC: 5 10*3/uL (ref 3.4–10.8)

## 2022-09-06 LAB — HEMOGLOBIN A1C
Est. average glucose Bld gHb Est-mCnc: 117 mg/dL
Hgb A1c MFr Bld: 5.7 % — ABNORMAL HIGH (ref 4.8–5.6)

## 2022-09-06 MED ORDER — ROSUVASTATIN CALCIUM 20 MG PO TABS
20.0000 mg | ORAL_TABLET | Freq: Every day | ORAL | 1 refills | Status: DC
Start: 2022-09-06 — End: 2023-02-19
  Filled 2022-09-06: qty 90, 90d supply, fill #0
  Filled 2022-12-26: qty 90, 90d supply, fill #1

## 2022-09-09 ENCOUNTER — Other Ambulatory Visit: Payer: Self-pay

## 2022-09-12 ENCOUNTER — Ambulatory Visit: Payer: No Typology Code available for payment source | Admitting: Orthopedic Surgery

## 2022-10-09 ENCOUNTER — Other Ambulatory Visit: Payer: Self-pay

## 2022-11-26 ENCOUNTER — Other Ambulatory Visit: Payer: Self-pay

## 2022-11-27 ENCOUNTER — Other Ambulatory Visit: Payer: Self-pay

## 2022-12-10 ENCOUNTER — Ambulatory Visit (HOSPITAL_COMMUNITY)
Admission: EM | Admit: 2022-12-10 | Discharge: 2022-12-10 | Disposition: A | Discharge status: Home / Self Care | Payer: No Typology Code available for payment source | Attending: Family Medicine | Admitting: Family Medicine

## 2022-12-10 ENCOUNTER — Encounter (HOSPITAL_COMMUNITY): Payer: Self-pay | Admitting: Emergency Medicine

## 2022-12-10 ENCOUNTER — Ambulatory Visit (INDEPENDENT_AMBULATORY_CARE_PROVIDER_SITE_OTHER): Payer: No Typology Code available for payment source

## 2022-12-10 DIAGNOSIS — R070 Pain in throat: Secondary | ICD-10-CM

## 2022-12-10 DIAGNOSIS — Z1152 Encounter for screening for COVID-19: Secondary | ICD-10-CM | POA: Insufficient documentation

## 2022-12-10 DIAGNOSIS — J069 Acute upper respiratory infection, unspecified: Secondary | ICD-10-CM

## 2022-12-10 DIAGNOSIS — R059 Cough, unspecified: Secondary | ICD-10-CM

## 2022-12-10 DIAGNOSIS — R0789 Other chest pain: Secondary | ICD-10-CM

## 2022-12-10 DIAGNOSIS — J029 Acute pharyngitis, unspecified: Secondary | ICD-10-CM | POA: Insufficient documentation

## 2022-12-10 LAB — POCT RAPID STREP A, ED / UC: Streptococcus, Group A Screen (Direct): NEGATIVE

## 2022-12-10 LAB — SARS CORONAVIRUS 2 (TAT 6-24 HRS): SARS Coronavirus 2: NEGATIVE

## 2022-12-10 MED ORDER — CHERATUSSIN AC 100-10 MG/5ML PO SOLN
5.0000 mL | Freq: Four times a day (QID) | ORAL | 0 refills | Status: AC | PRN
Start: 2022-12-10 — End: ?

## 2022-12-10 MED ORDER — IBUPROFEN 600 MG PO TABS: 600.0000 mg | ORAL_TABLET | Freq: Three times a day (TID) | ORAL | 0 refills | Status: DC | PRN

## 2022-12-10 NOTE — Discharge Instructions (Addendum)
Strep test was negative (la prueba de strep fue negativa)  The x-ray did not show any pneumonia or fluid(no se ve pulmonia ni liquido en las radiografias)  Robitussin with codeine cough syrup--take 5 mL by mouth 4 times daily as needed for cough.  This medication can cause sleepiness (la jarabe de robitussin y codeina--tome 5 ml por la boca cada 6 horas cuando tiene tos; puede causar sueno)  Take ibuprofen 600 mg--1 tab every 8 hours as needed for pain. (Tome Ud. 600 mg --1 cada 8 horas cuando tiene dolor)   You have been swabbed for COVID, and the test will result in the next 24 hours. Our staff will call you if positive. If the COVID test is positive, you should quarantine for 5 days from the start of your symptoms  (Hemos hecho una prueba de COVID. Si es positiva, las enfermeras le hablarian a decirle. Si es positiva, Ud debe hacer cuarentena por 5 dias del primer dia que Ud tuvo las sintomas)

## 2022-12-10 NOTE — ED Triage Notes (Signed)
Pt reports a cough, chest congestion,nasal congestion, pain in chest when coughing and sore throat since Friday.  Has tried Theraflu.

## 2022-12-10 NOTE — ED Provider Notes (Signed)
[facility]    CSN: [id number] Arrival date & time: 12/10/22  0932      History   Chief Complaint Chief Complaint  Patient presents with   Cough    HPI [name] [name] [name] [name] is a 71 y.o. female.    Cough  Here for cough and congestion and sore throat.  Symptoms began on December 15.  She maybe had fever the first day.  No nausea or vomiting or diarrhea.  Her lower chest bilaterally is sore from the coughing.  No shortness of breath or wheezing  She has had a lot of   Nasal drainage being and no asthma.  She is treated for hypertension.  Last EGFR was 95 earlier this year  Past Medical History:  Diagnosis Date   Allergy    spring pollen   Arthritis    Astigmatism, 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   Atrial fibrillation ([place])    Cataract 01/28/2017   High cholesterol    History of kidney stones    Hypermetropia, 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   Hypertension    Osteoarthritis of ankle, right    and Subtalar Joint   Osteoporosis    Presbyopia of both eyes 01/28/2017   Pterygium eye, 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    Patient Active Problem List   Diagnosis Date Noted   Atrial fibrillation ([place]) 10/01/2019   Atrial fibrillation with RVR ([place]) 10/01/2019   Osteopenia of multiple sites 02/05/2019   Hyperlipidemia 02/05/2019   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/2017   Post-traumatic osteoarthritis, right ankle and foot    Seasonal allergic rhinitis due to pollen 05/12/2017   Allergy    Cataract 01/28/2017   Presbyopia of both eyes 01/28/2017   Pterygium eye, 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   Astigmatism, 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   Hypermetropia, 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   Acquired bilateral flat feet 09/07/2015   Osteoporosis 09/07/2015   Elevated troponin 01/26/2015   Generalized anxiety disorder 01/26/2015   Hypertension 07/28/2014   Gastritis 07/28/2014   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   OBESITY 05/10/2009   NEPHROLITHIASIS 06/15/2008   Dyslipidemia 03/16/2008   OSTEOARTHRITIS, LUMBAR SPINE 01/09/2008    BUNDLE BRANCH BLOCK, LEFT 12/11/2007   PTSD 10/01/2007   ARM PAIN, LEFT 10/01/2007    Past Surgical History:  Procedure Laterality Date   ANKLE FUSION Right 12/24/2017   Tibiocalcaneal fusion   ANKLE FUSION Right 12/24/2017   Procedure: RIGHT TIBIOCALCANEAL FUSION;  Surgeon: Newt Minion, MD;  Location: [place];  Service: Orthopedics;  Laterality: Right;   CHOLECYSTECTOMY      OB History     Gravida  14   Para      Term      Preterm      AB  2   Living  10      SAB  2   IAB      Ectopic      Multiple      Live Births  10            Home Medications    Prior to Admission medications   Medication Sig Start Date End Date Taking? Authorizing Provider  guaiFENesin-codeine (CHERATUSSIN AC) 100-10 MG/5ML syrup Take 5 mLs by mouth 4 (four) times daily as needed for cough. 12/10/22  Yes Barrett Henle, MD  ibuprofen (ADVIL) 600 MG tablet Take 1 tablet (600 mg total) by mouth every 8 (eight) hours as needed (pain). 12/10/22  Yes Barrett Henle, MD  acetaminophen (TYLENOL) 500 MG tablet Take 1,000 mg by mouth every 6 (six)  hours as needed for mild pain or headache.    [provider]  aspirin EC 81 MG tablet Take 1 tablet (81 mg total) by mouth daily. Swallow whole. 09/04/22   Charlott Rakes, MD  diclofenac Sodium (VOLTAREN) 1 % GEL Apply 2 g topically 4 (four) times daily. 09/04/22   Charlott Rakes, MD  lisinopril-hydrochlorothiazide (ZESTORETIC) 20-25 MG tablet TAKE 1 TABLET BY MOUTH DAILY. 09/04/22 09/04/23  Charlott Rakes, MD  metoprolol succinate (TOPROL-XL) 25 MG 24 hr tablet TAKE 0.5 TABLETS (12.5 MG TOTAL) BY MOUTH DAILY. 09/04/22 09/04/23  Charlott Rakes, MD  rosuvastatin (CRESTOR) 20 MG tablet Take 1 tablet (20 mg total) by mouth daily. 09/06/22   Charlott Rakes, MD    Family History History reviewed. No pertinent family history.  Social History Social History   Tobacco Use   Smoking status: Never   Smokeless tobacco: Never  Vaping Use    Vaping Use: Never used  Substance Use Topics   Alcohol use: No    Alcohol/week: 0.0 standard drinks of alcohol   Drug use: No     Allergies   Patient has no known allergies.   Review of Systems Review of Systems  Respiratory:  Positive for cough.      Physical Exam Triage Vital Signs ED Triage Vitals  Enc Vitals Group     BP 12/10/22 1210 (!) 157/105     Pulse Rate 12/10/22 1210 82     Resp 12/10/22 1210 18     Temp 12/10/22 1210 98.7 F (37.1 C)     Temp Source 12/10/22 1210 Oral     SpO2 12/10/22 1210 97 %     Weight --      Height --      Head Circumference --      Peak Flow --      Pain Score 12/10/22 1207 10     Pain Loc --      Pain Edu? --      Excl. in [place]? --    No data found.  Updated Vital Signs BP (!) 157/105 (BP Location: Left Arm)   Pulse 82   Temp 98.7 F (37.1 C) (Oral)   Resp 18   SpO2 97%   Visual Acuity Right Eye Distance:   Left Eye Distance:   Bilateral Distance:    Right Eye Near:   Left Eye Near:    Bilateral Near:     Physical Exam Vitals reviewed.  Constitutional:      General: She is not in acute distress.    Appearance: She is not toxic-appearing.  HENT:     Right Ear: Tympanic membrane and ear canal normal.     Left Ear: Tympanic membrane and ear canal normal.     Nose: Congestion present.     Mouth/Throat:     Mouth: Mucous membranes are moist.     Comments: Tonsillar pillars have some mild erythema.  There is clear mucus draining in the oropharynx Eyes:     Extraocular Movements: Extraocular movements intact.     Conjunctiva/sclera: Conjunctivae normal.     Pupils: Pupils are equal, round, and reactive to light.  Cardiovascular:     Rate and Rhythm: Normal rate and regular rhythm.     Heart sounds: No murmur heard. Pulmonary:     Effort: No respiratory distress.     Breath sounds: No stridor. No wheezing, rhonchi or rales.  Chest:     Chest wall: Tenderness (bilateral lower rib cage anteriorly)  present.   Musculoskeletal:     Cervical back: Neck supple.  Lymphadenopathy:     Cervical: No cervical adenopathy.  Skin:    Capillary Refill: Capillary refill takes less than 2 seconds.     Coloration: Skin is not jaundiced or pale.  Neurological:     General: No focal deficit present.     Mental Status: She is alert and oriented to person, place, and time.  Psychiatric:        Behavior: Behavior normal.      UC Treatments / Results  Labs (all labs ordered are listed, but only abnormal results are displayed) Labs Reviewed  SARS CORONAVIRUS 2 (TAT 6-24 HRS)  POCT RAPID STREP A, ED / UC    EKG   Radiology DG Chest 2 View  Result Date: 12/10/2022 CLINICAL DATA:  Cough with chest soreness. EXAM: CHEST - 2 VIEW COMPARISON:  09/30/2019 FINDINGS: The lungs are clear without focal pneumonia, edema, pneumothorax or pleural effusion. Interstitial markings are diffusely coarsened with chronic features. Cardiopericardial silhouette is at upper limits of normal for size. The visualized bony structures of the thorax are unremarkable. IMPRESSION: Chronic interstitial coarsening without acute cardiopulmonary findings. Electronically Signed   By: Misty Stanley M.D.   On: 12/10/2022 13:36    Procedures Procedures (including critical care time)  Medications Ordered in UC Medications - No data to display  Initial Impression / Assessment and Plan / UC Course  I have reviewed the triage vital signs and the nursing notes.  Pertinent labs & imaging results that were available during my care of the patient were reviewed by me and considered in my medical decision making (see chart for details).      Rapid strep is negative  Chest x-ray does not show any fluid or pneumonia, but does show some chronic interstitial markings.  She is swabbed for COVID, and if she is positive she is a candidate for Paxlovid as her last EGFR was 95.  Medication is sent for the cough and I have sent some ibuprofen  prescription strength for her pain Final Clinical Impressions(s) / UC Diagnoses   Final diagnoses:  Viral URI with cough  Throat pain     Discharge Instructions      Strep test was negative (la prueba de strep fue negativa)  The x-ray did not show any pneumonia or fluid(no se ve pulmonia ni liquido en las radiografias)  Robitussin with codeine cough syrup--take 5 mL by mouth 4 times daily as needed for cough.  This medication can cause sleepiness (la jarabe de robitussin y codeina--tome 5 ml por la boca cada 6 horas cuando tiene tos; puede causar sueno)  Take ibuprofen 600 mg--1 tab every 8 hours as needed for pain. (Tome Ud. 600 mg --1 cada 8 horas cuando tiene dolor)   You have been swabbed for COVID, and the test will result in the next 24 hours. Our staff will call you if positive. If the COVID test is positive, you should quarantine for 5 days from the start of your symptoms  (Hemos hecho una prueba de COVID. Si es positiva, las enfermeras le hablarian a decirle. Si es positiva, Ud debe Patent attorney por 5 dias del primer dia que Ud tuvo las sintomas)         ED Prescriptions     Medication Sig Dispense Auth. Provider   guaiFENesin-codeine (CHERATUSSIN AC) 100-10 MG/5ML syrup Take 5 mLs by mouth 4 (four) times daily as needed for cough. 120 mL  Barrett Henle, MD   ibuprofen (ADVIL) 600 MG tablet Take 1 tablet (600 mg total) by mouth every 8 (eight) hours as needed (pain). 15 tablet Emaad Nanna, Gwenlyn Perking, MD      I have reviewed the PDMP during this encounter.   Barrett Henle, MD 12/10/22 1345

## 2022-12-26 ENCOUNTER — Other Ambulatory Visit: Payer: Self-pay

## 2022-12-27 ENCOUNTER — Other Ambulatory Visit: Payer: Self-pay

## 2023-01-08 ENCOUNTER — Other Ambulatory Visit: Payer: Self-pay

## 2023-01-12 ENCOUNTER — Emergency Department (HOSPITAL_COMMUNITY)
Admission: EM | Admit: 2023-01-12 | Discharge: 2023-01-12 | Disposition: A | Discharge status: Home / Self Care | Payer: Self-pay | Attending: Emergency Medicine | Admitting: Emergency Medicine

## 2023-01-12 ENCOUNTER — Emergency Department (HOSPITAL_COMMUNITY): Payer: Self-pay

## 2023-01-12 ENCOUNTER — Ambulatory Visit (HOSPITAL_COMMUNITY)
Admission: EM | Admit: 2023-01-12 | Discharge: 2023-01-12 | Disposition: A | Discharge status: Home / Self Care | Payer: Self-pay | Attending: Family Medicine | Admitting: Family Medicine

## 2023-01-12 ENCOUNTER — Ambulatory Visit (HOSPITAL_COMMUNITY): Payer: Self-pay

## 2023-01-12 ENCOUNTER — Ambulatory Visit (INDEPENDENT_AMBULATORY_CARE_PROVIDER_SITE_OTHER): Payer: Self-pay

## 2023-01-12 ENCOUNTER — Encounter (HOSPITAL_COMMUNITY): Payer: Self-pay

## 2023-01-12 ENCOUNTER — Other Ambulatory Visit: Payer: Self-pay

## 2023-01-12 DIAGNOSIS — R079 Chest pain, unspecified: Secondary | ICD-10-CM | POA: Insufficient documentation

## 2023-01-12 DIAGNOSIS — M25512 Pain in left shoulder: Secondary | ICD-10-CM

## 2023-01-12 DIAGNOSIS — S43005A Unspecified dislocation of left shoulder joint, initial encounter: Secondary | ICD-10-CM

## 2023-01-12 DIAGNOSIS — Z7982 Long term (current) use of aspirin: Secondary | ICD-10-CM | POA: Insufficient documentation

## 2023-01-12 DIAGNOSIS — M5020 Other cervical disc displacement, unspecified cervical region: Secondary | ICD-10-CM | POA: Insufficient documentation

## 2023-01-12 DIAGNOSIS — W19XXXA Unspecified fall, initial encounter: Secondary | ICD-10-CM | POA: Insufficient documentation

## 2023-01-12 DIAGNOSIS — S0990XA Unspecified injury of head, initial encounter: Secondary | ICD-10-CM | POA: Insufficient documentation

## 2023-01-12 DIAGNOSIS — S43015A Anterior dislocation of left humerus, initial encounter: Secondary | ICD-10-CM | POA: Insufficient documentation

## 2023-01-12 DIAGNOSIS — Z79899 Other long term (current) drug therapy: Secondary | ICD-10-CM | POA: Insufficient documentation

## 2023-01-12 DIAGNOSIS — I10 Essential (primary) hypertension: Secondary | ICD-10-CM | POA: Insufficient documentation

## 2023-01-12 LAB — BASIC METABOLIC PANEL
Anion gap: 10 (ref 5–15)
BUN: 20 mg/dL (ref 8–23)
CO2: 20 mmol/L — ABNORMAL LOW (ref 22–32)
Calcium: 9.1 mg/dL (ref 8.9–10.3)
Chloride: 106 mmol/L (ref 98–111)
Creatinine, Ser: 0.57 mg/dL (ref 0.44–1.00)
GFR, Estimated: >60 mL/min (ref >60)
Glucose, Bld: 122 mg/dL — ABNORMAL HIGH (ref 70–99)
Potassium: 3.7 mmol/L (ref 3.5–5.1)
Sodium: 136 mmol/L (ref 135–145)

## 2023-01-12 LAB — CBC
HCT: 37.7 % (ref 36.0–46.0)
Hemoglobin: 12.5 g/dL (ref 12.0–15.0)
MCH: 32 pg (ref 26.0–34.0)
MCHC: 33.2 g/dL (ref 30.0–36.0)
MCV: 96.4 fL (ref 80.0–100.0)
Platelets: 258 10*3/uL (ref 150–400)
RBC: 3.91 MIL/uL (ref 3.87–5.11)
RDW: 12.8 % (ref 11.5–15.5)
WBC: 10.5 10*3/uL (ref 4.0–10.5)
nRBC: 0 % (ref 0.0–0.2)

## 2023-01-12 LAB — TROPONIN I (HIGH SENSITIVITY): Troponin I (High Sensitivity): 4 ng/L (ref <18)

## 2023-01-12 MED ORDER — KETOROLAC TROMETHAMINE 30 MG/ML IJ SOLN
INTRAMUSCULAR | Status: AC
  Filled 2023-01-12: qty 1

## 2023-01-12 MED ORDER — PROPOFOL 10 MG/ML IV BOLUS
INTRAVENOUS | Status: AC | PRN
  Administered 2023-01-12: 40 mg via INTRAVENOUS

## 2023-01-12 MED ORDER — PROPOFOL 10 MG/ML IV BOLUS
0.5000 mg/kg | Freq: Once | INTRAVENOUS | Status: DC
  Filled 2023-01-12: qty 20

## 2023-01-12 MED ORDER — FENTANYL CITRATE PF 50 MCG/ML IJ SOSY
100.0000 ug | PREFILLED_SYRINGE | Freq: Once | INTRAMUSCULAR | Status: AC
  Administered 2023-01-12: 100 ug via INTRAVENOUS
  Filled 2023-01-12: qty 2

## 2023-01-12 MED ORDER — KETOROLAC TROMETHAMINE 30 MG/ML IJ SOLN
30.0000 mg | Freq: Once | INTRAMUSCULAR | Status: AC
Start: 2023-01-12 — End: 2023-01-12
  Administered 2023-01-12: 30 mg via INTRAMUSCULAR

## 2023-01-12 NOTE — ED Provider Triage Note (Signed)
Emergency Medicine Provider Triage Evaluation Note  Eugene Zeiders , a 72 y.o. female  was evaluated in triage.  Pt complains of left shoulder pain after a mechanical fall at home today.  Review of Systems  Positive: Left shoulder pain, cervical spine pain Negative: syncope  Physical Exam  BP (!) 146/79   Pulse 81   Temp 98.8 F (37.1 C)   Resp 18   SpO2 97%  Gen:   Awake, no distress   Resp:  Normal effort  MSK:   Left arm in sling. Left shoulder pain. Distal pulse, motor, sensation intact Other:    Medical Decision Making  Medically screening exam initiated at 2:39 PM.  Appropriate orders placed.  Sherren Kerns [name] was informed that the remainder of the evaluation will be completed by another provider, this initial triage assessment does not replace that evaluation, and the importance of remaining in the ED until their evaluation is complete.     Etta Quill, NP 01/12/23 2030

## 2023-01-12 NOTE — ED Triage Notes (Signed)
Patient reports through interpreter that she fell on her left side today. Shoulder pain, arm and back pain. Hasn't took anything for the pain.

## 2023-01-12 NOTE — ED Notes (Signed)
CT notified for patient ready for transport.

## 2023-01-12 NOTE — ED Provider Notes (Signed)
[facility]    CSN: [id number] Arrival date & time: 01/12/23  1132      History   Chief Complaint Chief Complaint  Patient presents with   Fall    HPI [name] [name] is a 72 y.o. female.   Spanish interpreter used today.  Patient is here for left arm/shoulder pain after a fall.  She was going down the steps of her porch, and she fell.  She is having pain at the left shoulder and left elbow.  She had immediate when she fell.   She state she was unable to move the shoulder/elbow after it happened.  She has not taken any pain meds as of yet.        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   Hypertension    Osteoarthritis of ankle, right    and Subtalar Joint   Osteoporosis    Presbyopia of both eyes 01/28/2017   Pterygium eye, 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    Patient Active Problem List   Diagnosis Date Noted   Atrial fibrillation (HCC) 10/01/2019   Atrial fibrillation with RVR (HCC) 10/01/2019   Osteopenia of multiple sites 02/05/2019   Hyperlipidemia 02/05/2019   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/2017   Post-traumatic osteoarthritis, right ankle and foot    Seasonal allergic rhinitis due to pollen 05/12/2017   Allergy    Cataract 01/28/2017   Presbyopia of both eyes 01/28/2017   Pterygium eye, 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   Astigmatism, 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   Hypermetropia, 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   Acquired bilateral flat feet 09/07/2015   Osteoporosis 09/07/2015   Elevated troponin 01/26/2015   Generalized anxiety disorder 01/26/2015   Hypertension 07/28/2014   Gastritis 07/28/2014   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   OBESITY 05/10/2009   NEPHROLITHIASIS 06/15/2008   Dyslipidemia 03/16/2008   OSTEOARTHRITIS, LUMBAR SPINE 01/09/2008   BUNDLE BRANCH BLOCK,  LEFT 12/11/2007   PTSD 10/01/2007   ARM PAIN, LEFT 10/01/2007    Past Surgical History:  Procedure Laterality Date   ANKLE FUSION Right 12/24/2017   Tibiocalcaneal fusion   ANKLE FUSION Right 12/24/2017   Procedure: RIGHT TIBIOCALCANEAL FUSION;  Surgeon: Nadara Mustard, MD;  Location: [facility];  Service: Orthopedics;  Laterality: Right;   CHOLECYSTECTOMY      OB History     Gravida  13   Para      Term      Preterm      AB  2   Living  10      SAB  2   IAB      Ectopic      Multiple      Live Births  10            Home Medications    Prior to Admission medications   Medication Sig Start Date End Date Taking? Authorizing Provider  aspirin EC 81 MG tablet Take 1 tablet (81 mg total) by mouth daily. Swallow whole. 09/04/22  Yes Newlin, Odette Horns, MD  lisinopril-hydrochlorothiazide (ZESTORETIC) 20-25 MG tablet TAKE 1 TABLET BY MOUTH DAILY. 09/04/22 09/04/23 Yes Hoy Register, MD  metoprolol succinate (TOPROL-XL) 25 MG 24 hr tablet TAKE 0.5 TABLETS (12.5 MG TOTAL) BY MOUTH DAILY. 09/04/22 09/04/23 Yes Hoy Register, MD  rosuvastatin (CRESTOR) 20  MG tablet Take 1 tablet (20 mg total) by mouth daily. 09/06/22  Yes Charlott Rakes, MD  acetaminophen (TYLENOL) 500 MG tablet Take 1,000 mg by mouth every 6 (six) hours as needed for mild pain or headache.    [provider]  diclofenac Sodium (VOLTAREN) 1 % GEL Apply 2 g topically 4 (four) times daily. 09/04/22   Charlott Rakes, MD  guaiFENesin-codeine (CHERATUSSIN AC) 100-10 MG/5ML syrup Take 5 mLs by mouth 4 (four) times daily as needed for cough. 12/10/22   Barrett Henle, MD  ibuprofen (ADVIL) 600 MG tablet Take 1 tablet (600 mg total) by mouth every 8 (eight) hours as needed (pain). 12/10/22   Barrett Henle, MD    Family History History reviewed. No pertinent family history.  Social History Social History   Tobacco Use   Smoking status: Never   Smokeless tobacco: Never  Vaping Use   Vaping Use:  Never used  Substance Use Topics   Alcohol use: No    Alcohol/week: 0.0 standard drinks of alcohol   Drug use: No     Allergies   Patient has no known allergies.   Review of Systems Review of Systems  Constitutional: Negative.   HENT: Negative.    Respiratory: Negative.    Cardiovascular: Negative.   Gastrointestinal: Negative.   Genitourinary: Negative.   Psychiatric/Behavioral: Negative.       Physical Exam Triage Vital Signs ED Triage Vitals  Enc Vitals Group     BP 01/12/23 1249 (!) 140/82     Pulse Rate 01/12/23 1249 (!) 59     Resp 01/12/23 1249 16     Temp 01/12/23 1249 98.1 F (36.7 C)     Temp Source 01/12/23 1249 Oral     SpO2 01/12/23 1249 95 %     Weight --      Height --      Head Circumference --      Peak Flow --      Pain Score 01/12/23 1245 8     Pain Loc --      Pain Edu? --      Excl. in [place]? --    No data found.  Updated Vital Signs BP (!) 140/82 (BP Location: Right Arm)   Pulse (!) 59   Temp 98.1 F (36.7 C) (Oral)   Resp 16   SpO2 95%   Visual Acuity Right Eye Distance:   Left Eye Distance:   Bilateral Distance:    Right Eye Near:   Left Eye Near:    Bilateral Near:     Physical Exam Constitutional:      General: She is not in acute distress.    Appearance: Normal appearance. She is not ill-appearing.     Comments: Sitting in wheelchair with left arm in a sling  Musculoskeletal:     Comments: She keeps her left arm to her side with elbow flexed;  She has TTP from the left wrist to the elbow (mild);  she has worsening pain at the upper arm and the left shoulder (severe at the shoulder);  She has full rom of the wrist and elbow;  she has very limited rom of the left shoulder at all due to pain.   Skin:    General: Skin is warm.  Neurological:     General: No focal deficit present.     Mental Status: She is alert.  Psychiatric:        Mood and Affect: Mood normal.  UC Treatments / Results  Labs (all labs  ordered are listed, but only abnormal results are displayed) Labs Reviewed - No data to display  EKG   Radiology DG Shoulder Left  Result Date: 01/12/2023 CLINICAL DATA:  Status post fall.  Left shoulder pain. EXAM: LEFT SHOULDER - 2+ VIEW COMPARISON:  None Available. FINDINGS: Anterior left shoulder dislocation. Can not exclude possible impaction fracture of the humeral head. Recommend attention on postreduction imaging. Glenoid appears to be intact. AC joint degenerative changes. Visualized left hemithorax is unremarkable. IMPRESSION: Anterior left shoulder dislocation. Can not exclude possible impaction fracture of the humeral head. Recommend attention on postreduction imaging. Electronically Signed   By: Lovey Newcomer M.D.   On: 01/12/2023 13:49    Procedures Procedures (including critical care time)  Medications Ordered in UC Medications  ketorolac (TORADOL) 30 MG/ML injection 30 mg (30 mg Intramuscular Given 01/12/23 1311)    Initial Impression / Assessment and Plan / UC Course  I have reviewed the triage vital signs and the nursing notes.  Pertinent labs & imaging results that were available during my care of the patient were reviewed by me and considered in my medical decision making (see chart for details).  Patient has a dislocated left shoulder after a fall today.  She is in quite of bit of pain today.  I did give her a shot of toradol today with some help.  However, given her pain I have asked her to go to the ER for reduction and further care.   Final Clinical Impressions(s) / UC Diagnoses   Final diagnoses:  Fall, initial encounter  Acute pain of left shoulder  Dislocation of left shoulder joint, initial encounter     Discharge Instructions      Tienes el hombro izquierdo dislocado. Vaya a la sala de emergencias para recibir Scientist, research (life sciences) y reducir esto en este momento.  You have a dislocated left shoulder.  Please go to the ER for further treatment and  reduction of this at this time.     ED Prescriptions   None    PDMP not reviewed this encounter.   Rondel Oh, MD 01/12/23 1400

## 2023-01-12 NOTE — ED Provider Notes (Signed)
[facility] Provider Note   CSN: [id number] Arrival date & time: 01/12/23  1413     History  Chief Complaint  Patient presents with   Shoulder Pain    Sherren Kerns [name] is a 72 y.o. female with past medical history significant for obesity, hyperlipidemia, hypertension, A-fib who is not currently taking any anticoagulation who presents with concern for fall today at around 10 or 1030 on the left side.  Patient reports that it was a mechanical fall, she endorses left shoulder pain, decreased sensation of the left arm, as well as pain to the entire left side of the body.  She reports pleuritic chest pain on the left.  Additionally patient reports that she had another fall on Tuesday of last week, reporting that she had some chest pain prior to the fall on Tuesday.  She denies having ongoing chest pain for the last week but occasionally had some pleuritic chest pain.  She cannot tell me when she is not taking a blood thinner.  She denies hitting her head or loss conscious today.  She does endorse some neck pain as well as left-sided pain.   Shoulder Pain      Home Medications Prior to Admission medications   Medication Sig Start Date End Date Taking? Authorizing Provider  acetaminophen (TYLENOL) 500 MG tablet Take 1,000 mg by mouth every 6 (six) hours as needed for mild pain or headache.    [provider]  aspirin EC 81 MG tablet Take 1 tablet (81 mg total) by mouth daily. Swallow whole. 09/04/22   Charlott Rakes, MD  diclofenac Sodium (VOLTAREN) 1 % GEL Apply 2 g topically 4 (four) times daily. 09/04/22   Charlott Rakes, MD  guaiFENesin-codeine (CHERATUSSIN AC) 100-10 MG/5ML syrup Take 5 mLs by mouth 4 (four) times daily as needed for cough. 12/10/22   Barrett Henle, MD  ibuprofen (ADVIL) 600 MG tablet Take 1 tablet (600 mg total) by mouth every 8 (eight) hours as needed (pain). 12/10/22   Barrett Henle, MD   lisinopril-hydrochlorothiazide (ZESTORETIC) 20-25 MG tablet TAKE 1 TABLET BY MOUTH DAILY. 09/04/22 09/04/23  Charlott Rakes, MD  metoprolol succinate (TOPROL-XL) 25 MG 24 hr tablet TAKE 0.5 TABLETS (12.5 MG TOTAL) BY MOUTH DAILY. 09/04/22 09/04/23  Charlott Rakes, MD  rosuvastatin (CRESTOR) 20 MG tablet Take 1 tablet (20 mg total) by mouth daily. 09/06/22   Charlott Rakes, MD      Allergies    Patient has no known allergies.    Review of Systems   Review of Systems  All other systems reviewed and are negative.   Physical Exam Updated Vital Signs BP (!) 157/80   Pulse 67   Temp 97.7 F (36.5 C) (Oral)   Resp 11   Ht 5\' 2"  (1.575 m)   Wt 81.6 kg   SpO2 100%   BMI 32.92 kg/m  Physical Exam Vitals and nursing note reviewed.  Constitutional:      General: She is not in acute distress.    Appearance: Normal appearance.  HENT:     Head: Normocephalic and atraumatic.  Eyes:     General:        Right eye: No discharge.        Left eye: No discharge.  Cardiovascular:     Rate and Rhythm: Normal rate and regular rhythm.     Heart sounds: No murmur heard.    No friction rub. No gallop.  Pulmonary:  Effort: Pulmonary effort is normal.     Breath sounds: Normal breath sounds.  Abdominal:     General: Bowel sounds are normal.     Palpations: Abdomen is soft.  Musculoskeletal:     Comments: Patient with notable deformity of left shoulder with anterior displacement of humeral head she has decreased range of motion at the shoulder almost entirely, she has some pain around the left elbow as well without step-off or deformity.  Skin:    General: Skin is warm and dry.     Capillary Refill: Capillary refill takes less than 2 seconds.  Neurological:     Mental Status: She is alert and oriented to person, place, and time.  Psychiatric:        Mood and Affect: Mood normal.        Behavior: Behavior normal.     ED Results / Procedures / Treatments   Labs (all labs ordered are  listed, but only abnormal results are displayed) Labs Reviewed  BASIC METABOLIC PANEL - Abnormal; Notable for the following components:      Result Value   CO2 20 (*)    Glucose, Bld 122 (*)    All other components within normal limits  CBC  TROPONIN I (HIGH SENSITIVITY)  TROPONIN I (HIGH SENSITIVITY)    EKG EKG Interpretation  Date/Time:  Sunday January 12 2023 15:54:33 EST Ventricular Rate:  75 PR Interval:  208 QRS Duration: 137 QT Interval:  463 QTC Calculation: 518 R Axis:   -22 Text Interpretation: Sinus rhythm Left bundle branch block No significant change since 1/21 Confirmed by Aletta Edouard [phone]) on 01/12/2023 3:58:48 PM  Radiology CT Cervical Spine Wo Contrast  Result Date: 01/12/2023 CLINICAL DATA:  Fall EXAM: CT HEAD WITHOUT CONTRAST CT CERVICAL SPINE WITHOUT CONTRAST TECHNIQUE: Multidetector CT imaging of the head and cervical spine was performed following the standard protocol without intravenous contrast. Multiplanar CT image reconstructions of the cervical spine were also generated. RADIATION DOSE REDUCTION: This exam was performed according to the departmental dose-optimization program which includes automated exposure control, adjustment of the mA and/or kV according to patient size and/or use of iterative reconstruction technique. COMPARISON:  None Available. FINDINGS: CT HEAD FINDINGS Brain: No evidence of acute infarction, hemorrhage, hydrocephalus, extra-axial collection or mass lesion/mass effect. Multiple small calcifications scattered throughout the brain and ependyma, most in keeping with chronic neurocysticercosis. Vascular: No hyperdense vessel or unexpected calcification. Skull: Normal. Negative for fracture or focal lesion. Sinuses/Orbits: No acute finding. Other: None. CT CERVICAL SPINE FINDINGS Alignment: Normal. Skull base and vertebrae: No acute fracture. No primary bone lesion or focal pathologic process. Soft tissues and spinal canal: No prevertebral  fluid or swelling. No visible canal hematoma. Disc levels: Moderate multilevel disc space height loss and osteophytosis throughout the cervical spine. Large central disc protrusion at C3-C4 with significant narrowing of the central cervical canal, narrowed to approximately 0.5 cm (series 10, image 37, series 8, image 36). Upper chest: Negative. Other: None. IMPRESSION: 1. No acute intracranial pathology. 2. Multiple small calcifications scattered throughout the brain and ependyma, most in keeping with chronic neurocysticercosis. 3. No fracture or static subluxation of the cervical spine. 4. Moderate multilevel disc space height loss and osteophytosis throughout the cervical spine. 5. Large central disc protrusion at C3-C4 with significant narrowing of the central cervical canal, narrowed to approximately 0.5 cm. This could be further evaluated by MRI if desired. Electronically Signed   By: Delanna Ahmadi M.D.   On: 01/12/2023 20:52  CT Head Wo Contrast  Result Date: 01/12/2023 CLINICAL DATA:  Fall EXAM: CT HEAD WITHOUT CONTRAST CT CERVICAL SPINE WITHOUT CONTRAST TECHNIQUE: Multidetector CT imaging of the head and cervical spine was performed following the standard protocol without intravenous contrast. Multiplanar CT image reconstructions of the cervical spine were also generated. RADIATION DOSE REDUCTION: This exam was performed according to the departmental dose-optimization program which includes automated exposure control, adjustment of the mA and/or kV according to patient size and/or use of iterative reconstruction technique. COMPARISON:  None Available. FINDINGS: CT HEAD FINDINGS Brain: No evidence of acute infarction, hemorrhage, hydrocephalus, extra-axial collection or mass lesion/mass effect. Multiple small calcifications scattered throughout the brain and ependyma, most in keeping with chronic neurocysticercosis. Vascular: No hyperdense vessel or unexpected calcification. Skull: Normal. Negative for  fracture or focal lesion. Sinuses/Orbits: No acute finding. Other: None. CT CERVICAL SPINE FINDINGS Alignment: Normal. Skull base and vertebrae: No acute fracture. No primary bone lesion or focal pathologic process. Soft tissues and spinal canal: No prevertebral fluid or swelling. No visible canal hematoma. Disc levels: Moderate multilevel disc space height loss and osteophytosis throughout the cervical spine. Large central disc protrusion at C3-C4 with significant narrowing of the central cervical canal, narrowed to approximately 0.5 cm (series 10, image 37, series 8, image 36). Upper chest: Negative. Other: None. IMPRESSION: 1. No acute intracranial pathology. 2. Multiple small calcifications scattered throughout the brain and ependyma, most in keeping with chronic neurocysticercosis. 3. No fracture or static subluxation of the cervical spine. 4. Moderate multilevel disc space height loss and osteophytosis throughout the cervical spine. 5. Large central disc protrusion at C3-C4 with significant narrowing of the central cervical canal, narrowed to approximately 0.5 cm. This could be further evaluated by MRI if desired. Electronically Signed   By: Delanna Ahmadi M.D.   On: 01/12/2023 20:52   DG Shoulder Left Portable  Result Date: 01/12/2023 CLINICAL DATA:  Status post reduction. EXAM: LEFT SHOULDER COMPARISON:  January 12, 2023 (1:25 p.m.) FINDINGS: Deformities of indeterminate age are seen along the superolateral and inferior medial aspects of the left humeral head. There is no evidence of dislocation. Mild to moderate severity degenerative changes are seen involving the left acromioclavicular joint and left glenohumeral articulation. Soft tissues are unremarkable. IMPRESSION: 1. Deformities of indeterminate age along the superolateral and inferior medial aspects of the left humeral head. 2. Interval reduction of the anterior shoulder dislocation seen on the prior study. Electronically Signed   By: Virgina Norfolk M.D.   On: 01/12/2023 19:34   DG Elbow 2 Views Left  Result Date: 01/12/2023 CLINICAL DATA:  And LEFT arm pain post fall, shoulder dislocation EXAM: LEFT ELBOW - 2 VIEW COMPARISON:  None Available. FINDINGS: Osseous demineralization. Joint spaces preserved. No elbow fracture, dislocation, or bone destruction. No joint effusion. IMPRESSION: No acute abnormalities. Electronically Signed   By: Lavonia Dana M.D.   On: 01/12/2023 17:29   DG Chest 2 View  Result Date: 01/12/2023 CLINICAL DATA:  Chest pain, LEFT shoulder pain, history of dislocation, decreased sensation LEFT arm, mechanical fall EXAM: CHEST - 2 VIEW COMPARISON:  12/10/2022 Correlation: LEFT shoulder radiographs 01/12/2023 FINDINGS: Mild enlargement of cardiac silhouette. Mediastinal contours and pulmonary vascularity normal. Atherosclerotic calcification aorta. Mild subsegmental atelectasis LEFT base. Lungs otherwise clear. No infiltrate, pleural effusion, or pneumothorax. Anterior glenohumeral dislocation as noted on earlier shoulder radiographs. Bones demineralized. IMPRESSION: Mild enlargement of cardiac silhouette with minimal subsegmental atelectasis at LEFT lung base. Anterior LEFT glenohumeral dislocation. Electronically Signed  By: Lavonia Dana M.D.   On: 01/12/2023 17:28   DG Shoulder Left  Result Date: 01/12/2023 CLINICAL DATA:  Status post fall.  Left shoulder pain. EXAM: LEFT SHOULDER - 2+ VIEW COMPARISON:  None Available. FINDINGS: Anterior left shoulder dislocation. Can not exclude possible impaction fracture of the humeral head. Recommend attention on postreduction imaging. Glenoid appears to be intact. AC joint degenerative changes. Visualized left hemithorax is unremarkable. IMPRESSION: Anterior left shoulder dislocation. Can not exclude possible impaction fracture of the humeral head. Recommend attention on postreduction imaging. Electronically Signed   By: Lovey Newcomer M.D.   On: 01/12/2023 13:49     Procedures Reduction of dislocation  Date/Time: 01/12/2023 6:56 PM  Performed by: Anselmo Pickler, PA-C Authorized by: Anselmo Pickler, PA-C  Consent: Verbal consent obtained. Written consent obtained. Risks and benefits: risks, benefits and alternatives were discussed Consent given by: patient Patient understanding: patient states understanding of the procedure being performed Patient consent: the patient's understanding of the procedure matches consent given Procedure consent: procedure consent matches procedure scheduled Relevant documents: relevant documents present and verified Test results: test results available and properly labeled Site marked: the operative site was marked Imaging studies: imaging studies available Patient identity confirmed: arm band and verbally with patient Time out: Immediately prior to procedure a "time out" was called to verify the correct patient, procedure, equipment, support staff and site/side marked as required. Local anesthesia used: no  Anesthesia: Local anesthesia used: no  Sedation: Patient sedated: yes Sedation type: moderate (conscious) sedation Sedatives: propofol Analgesia: fentanyl Sedation start date/time: 01/12/2023 6:47 PM Sedation end date/time: 01/12/2023 6:57 PM  Patient tolerance: patient tolerated the procedure well with no immediate complications       Medications Ordered in ED Medications  propofol (DIPRIVAN) 10 mg/mL bolus/IV push 40.8 mg (40.8 mg Intravenous Not Given 01/12/23 1857)  fentaNYL (SUBLIMAZE) injection 100 mcg (100 mcg Intravenous Given 01/12/23 1744)  propofol (DIPRIVAN) 10 mg/mL bolus/IV push (40 mg Intravenous Given 01/12/23 1848)    ED Course/ Medical Decision Making/ A&P Clinical Course as of 01/12/23 2114  Sun Jan 12, 7471  [id number] 72 year old Spanish-speaking female here after fall.  She went to urgent care initially had an x-ray showing anterior shoulder dislocation.  She is getting  some labs EKG and other imaging due to complaining of some chest pain.  Anticipate will need procedural sedation and reduction.  Final disposition per results of testing. [MB]    Clinical Course User Index [MB] Hayden Rasmussen, MD                             Medical Decision Making Amount and/or Complexity of Data Reviewed Labs: ordered. Radiology: ordered.  Risk Prescription drug management.   This patient is a 72 y.o. female  who presents to the ED for concern of mechanical fall today with fall earlier this week of unclear origin, patient did endorse some chest pain prior to fall on Tuesday, she presents to the emergency department with known the left shoulder dislocation from urgent care.   Differential diagnoses prior to evaluation: The emergent differential diagnosis includes, but is not limited to,  ACS, AAS, PE, Mallory-Weiss, Boerhaave's, Pneumonia, acute bronchitis, asthma or COPD exacerbation, anxiety, MSK pain or traumatic injury to the chest, acid reflux versus other, additionally considered traumatic dislocation, other injury, rib fracture, pneumothorax, hemothorax, unstable cervical spine fracture, intracranial injury or bleed versus evidence. This is not an exhaustive  differential.   Past Medical History / Co-morbidities: Intermittent A-fib, not currently on anticoagulation, obesity, left bundle branch block, hyperlipidemia, hypertension  Additional history: Chart reviewed. Pertinent results include: Reviewed imaging from outpatient evaluation urgent care just prior to arrival, as well as outpatient family medicine visits from last year  Physical Exam: Physical exam performed. The pertinent findings include: Patient with dislocated appearance of left upper extremity with anterior displacement of left humeral head.  She has some tenderness to the left side of the chest wall and elbow but without any palpable step-off, deformity.  She is no hypoechoic lung sounds on  auscultation, trachea is midline.  No leg length discrepancy, hip tenderness to palpation, laceration or abrasion of the head.  Some tenderness on the left cervical paraspinous muscles.  Status post reduction with improvement of range of motion of the left shoulder and normal anatomical location of the humeral head.  Lab Tests/Imaging studies: I personally interpreted labs/imaging and the pertinent results include: Troponin negative x 1 in context of no active chest pain today, however a concerning story of some chest pain on Tuesday prior to fall per patient.  CBC is unremarkable, her BMP is overall unremarkable, she has mild bicarb deficit at 20, but no anion gap or other signs of acidosis..  I dependently interpreted plain film of the chest, left elbow, left shoulder status post reduction, as well as CT of the C-spine and head which showed no acute fracture, dislocation, and interval reduction of previous dislocation.  Patient does have some central cord narrowing, but with no acute neurologic compromise on today's exam I do not think that she needs an emergent MRI, will have her follow-up with neurosurgery, and extensive return precautions given.  I agree with the radiologist interpretation.  Cardiac monitoring: EKG obtained and interpreted by my attending physician which shows: Left bundle branch block, no significant change from baseline   Medications: I ordered medication including fentanyl for pain, propofol for sedation.  I have reviewed the patients home medicines and have made adjustments as needed.   Disposition: After consideration of the diagnostic results and the patients response to treatment, I feel that patient's clinical presentation today significant for left shoulder dislocation, we were able to reduce without difficulty.  We performed a more broad workup for any other traumatic injury as well as any cardiogenic reason for her to have falling over the last several days, no evidence  of ischemic heart condition, ACS, AAS, or arrhythmia to explain her falls, likely mechanical in nature at this time, but encourage close PCP, orthopedic follow-up.Marland Kitchen   emergency department workup does not suggest an emergent condition requiring admission or immediate intervention beyond what has been performed at this time. The plan is: as above. The patient is safe for discharge and has been instructed to return immediately for worsening symptoms, change in symptoms or any other concerns.  Final Clinical Impression(s) / ED Diagnoses Final diagnoses:  Anterior dislocation of left shoulder, initial encounter  Fall, initial encounter  Protrusion of cervical intervertebral disc    Rx / DC Orders ED Discharge Orders     None         [place] 01/12/23 2115    Terrilee Files, MD 01/13/23 1041

## 2023-01-12 NOTE — ED Notes (Signed)
PA Prosperi at bedside with ultrasound attempting IV insertion.

## 2023-01-12 NOTE — ED Provider Notes (Signed)
.  Sedation  Date/Time: 01/12/2023 6:58 PM  Performed by: Hayden Rasmussen, MD Authorized by: Hayden Rasmussen, MD   Consent:    Consent obtained:  Written   Consent given by:  Patient   Risks discussed:  Allergic reaction, dysrhythmia, inadequate sedation, nausea, vomiting, respiratory compromise necessitating ventilatory assistance and intubation, prolonged sedation necessitating reversal and prolonged hypoxia resulting in organ damage   Alternatives discussed:  Analgesia without sedation Universal protocol:    Procedure explained and questions answered to patient or proxy's satisfaction: yes     Immediately prior to procedure, a time out was called: yes     Patient identity confirmed:  Arm band Indications:    Procedure performed:  Dislocation reduction   Procedure necessitating sedation performed by:  Different physician Pre-sedation assessment:    Time since last food or drink:  4   ASA classification: class 3 - patient with severe systemic disease     Mouth opening:  3 or more finger widths   Thyromental distance:  3 finger widths   Mallampati score:  IV - only hard palate visible   Neck mobility: normal     Pre-sedation assessments completed and reviewed: airway patency, cardiovascular function, hydration status, mental status, nausea/vomiting, pain level, respiratory function and temperature     Pre-sedation assessment completed:  01/12/2023 6:30 PM Immediate pre-procedure details:    Reassessment: Patient reassessed immediately prior to procedure     Reviewed: vital signs, relevant labs/tests and NPO status     Verified: bag valve mask available, emergency equipment available, intubation equipment available, IV patency confirmed, oxygen available, reversal medications available and suction available   Procedure details (see MAR for exact dosages):    Preoxygenation:  Nasal cannula   Sedation:  Propofol   Intra-procedure monitoring:  Blood pressure monitoring, cardiac monitor,  continuous pulse oximetry, frequent LOC assessments, frequent vital sign checks and continuous capnometry   Intra-procedure events: none     Total Provider sedation time (minutes):  10 Post-procedure details:    Post-sedation assessment completed:  01/12/2023 6:59 PM   Attendance: Constant attendance by certified staff until patient recovered     Recovery: Patient returned to pre-procedure baseline     Post-sedation assessments completed and reviewed: airway patency, cardiovascular function, hydration status, mental status, nausea/vomiting, pain level, respiratory function and temperature     Patient is stable for discharge or admission: yes     Procedure completion:  Tolerated well, no immediate complications     Hayden Rasmussen, MD 01/13/23 1031

## 2023-01-12 NOTE — ED Notes (Signed)
Pt paper consent signed for closed reduction of left shoulder done by MD Melina Copa. Placed in medical records for scanning into chart.

## 2023-01-12 NOTE — ED Notes (Signed)
.  uc

## 2023-01-12 NOTE — ED Notes (Signed)
This RN and Andee Poles, RN both attempted IV insertions.  Unsuccessful on both attempts.  PA Prosperi notified and aware.  No new orders at this time.

## 2023-01-12 NOTE — ED Notes (Signed)
Patient is being discharged from the Urgent Care and sent to the Emergency Department via personal vehicle. Per Dr Eustace Moore, patient is in need of higher level of care due to dislocated shoulder. Patient is aware and verbalizes understanding of plan of care.  Vitals:   01/12/23 1249  BP: (!) 140/82  Pulse: (!) 59  Resp: 16  Temp: 98.1 F (36.7 C)  SpO2: 95%

## 2023-01-12 NOTE — ED Notes (Signed)
Patient transported to CT 

## 2023-01-12 NOTE — ED Notes (Signed)
X-ray at bedside

## 2023-01-12 NOTE — ED Notes (Signed)
Patient transported to X-ray 

## 2023-01-12 NOTE — Discharge Instructions (Signed)
Tienes el hombro izquierdo dislocado. Vaya a la sala de emergencias para recibir Scientist, research (life sciences) y reducir esto en este momento.  You have a dislocated left shoulder.  Please go to the ER for further treatment and reduction of this at this time.

## 2023-01-12 NOTE — Discharge Instructions (Addendum)
Utilice Tylenol o ibuprofeno para el dolor. Puede utilizar 600 mg de ibuprofeno cada 6 horas o 1000 mg de Tylenol cada 6 horas. Puede optar por [employer] 2. Esto sera ms efectivo. No exceder los 4 g de Tylenol en 24 horas. No exceder los 3200 mg de ibuprofeno las 24 horas.  Trate de limitar su tiempo fuera del cabestrillo nicamente al bao, especialmente evite levantar el brazo por encima de la cabeza, as como la rotacin interna y externa para evitar que el hombro se salga del espacio articular.

## 2023-01-12 NOTE — ED Notes (Signed)
ED Provider at bedside. 

## 2023-01-12 NOTE — ED Triage Notes (Signed)
Pt here from UC after mechanical fall onto L side. Pt with L shoulder pain and decreased sensation to L arm. +dislocation.

## 2023-01-22 ENCOUNTER — Other Ambulatory Visit (HOSPITAL_COMMUNITY): Payer: Self-pay | Admitting: Physician Assistant

## 2023-01-22 DIAGNOSIS — M25512 Pain in left shoulder: Secondary | ICD-10-CM

## 2023-02-07 ENCOUNTER — Ambulatory Visit (HOSPITAL_COMMUNITY)
Admission: RE | Admit: 2023-02-07 | Discharge: 2023-02-07 | Disposition: A | Discharge status: Home / Self Care | Payer: Self-pay | Source: Ambulatory Visit | Attending: Physician Assistant | Admitting: Physician Assistant

## 2023-02-07 DIAGNOSIS — M25512 Pain in left shoulder: Secondary | ICD-10-CM | POA: Insufficient documentation

## 2023-02-12 ENCOUNTER — Telehealth: Payer: Self-pay | Admitting: Primary Care

## 2023-02-12 NOTE — Telephone Encounter (Signed)
We did not order MRI. Pt will need to contact [facility] to get results.

## 2023-02-12 NOTE — Telephone Encounter (Signed)
Copied from [place] [id number]. Topic: General - Other >> Feb 12, 2023  3:29 PM Cyndi Bender wrote: Reason for CRM: Pt daughter Baxter Kail called for MRI results. Cb# [phone]

## 2023-02-18 ENCOUNTER — Ambulatory Visit: Payer: Self-pay

## 2023-02-19 ENCOUNTER — Encounter: Payer: Self-pay | Admitting: Physician Assistant

## 2023-02-19 ENCOUNTER — Other Ambulatory Visit: Payer: Self-pay

## 2023-02-19 ENCOUNTER — Ambulatory Visit: Payer: Self-pay | Admitting: Physician Assistant

## 2023-02-19 VITALS — BP 143/72 | HR 65 | Ht 61.0 in | Wt 189.0 lb

## 2023-02-19 DIAGNOSIS — I10 Essential (primary) hypertension: Secondary | ICD-10-CM

## 2023-02-19 DIAGNOSIS — E785 Hyperlipidemia, unspecified: Secondary | ICD-10-CM

## 2023-02-19 DIAGNOSIS — S46012A Strain of muscle(s) and tendon(s) of the rotator cuff of left shoulder, initial encounter: Secondary | ICD-10-CM

## 2023-02-19 MED ORDER — ROSUVASTATIN CALCIUM 20 MG PO TABS
20.0000 mg | ORAL_TABLET | Freq: Every day | ORAL | 1 refills | Status: DC
  Filled 2023-02-19 – 2023-04-17 (×2): qty 90, 90d supply, fill #0
  Filled 2023-07-16: qty 90, 90d supply, fill #1

## 2023-02-19 MED ORDER — IBUPROFEN 600 MG PO TABS
600.0000 mg | ORAL_TABLET | Freq: Three times a day (TID) | ORAL | 1 refills | Status: AC | PRN
  Filled 2023-02-19: qty 30, 10d supply, fill #0

## 2023-02-19 MED ORDER — METOPROLOL SUCCINATE ER 25 MG PO TB24
ORAL_TABLET | Freq: Every day | ORAL | 1 refills | Status: DC
  Filled 2023-02-19: qty 180, 90d supply, fill #0
  Filled 2023-07-16: qty 180, 90d supply, fill #1
  Filled 2023-07-16: qty 45, 90d supply, fill #1

## 2023-02-19 MED ORDER — LISINOPRIL-HYDROCHLOROTHIAZIDE 20-25 MG PO TABS
1.0000 | ORAL_TABLET | Freq: Every day | ORAL | 1 refills | Status: DC
  Filled 2023-02-19: qty 90, 90d supply, fill #0
  Filled 2023-07-16: qty 90, 90d supply, fill #1

## 2023-02-19 NOTE — Progress Notes (Signed)
Established Patient Office Visit  Subjective   Patient ID: [name] [name], female    DOB: 1950/12/24  Age: 72 y.o. MRN: [id number]  Chief Complaint  Patient presents with   Annual Exam    Recent Fall, patient had MRI on 2.16.24, would like to Transfer care to [facility] due to husband going there. Need medication refills     States that she had a fall on 01/12/23, was seen in the UC note from that visit:   Spanish interpreter used today.  Patient is here for left arm/shoulder pain after a fall.  She was going down the steps of her porch, and she fell.  She is having pain at the left shoulder and left elbow.  She had immediate when she fell.   She state she was unable to move the shoulder/elbow after it happened.  She has not taken any pain meds as of yet.    Initial Impression / Assessment and Plan / UC Course I have reviewed the triage vital signs and the nursing notes.   Pertinent labs & imaging results that were available during my care of the patient were reviewed by me and considered in my medical decision making (see chart for details).   Patient has a dislocated left shoulder after a fall today.  She is in quite of bit of pain today.  I did give her a shot of toradol today with some help.  However, given her pain I have asked her to go to the ER for reduction and further care.   She did present to ED and had shoulder reduction performed.  She was then referred to [facility] for follow up and had MRI performed.   Narrative & Impression CLINICAL DATA:  Status post fall, anterior shoulder pain   EXAM: MRI OF THE LEFT SHOULDER WITHOUT CONTRAST   TECHNIQUE: Multiplanar, multisequence MR imaging of the shoulder was performed. No intravenous contrast was administered.   COMPARISON:  None Available.   FINDINGS: Rotator cuff: Complete tear of the supraspinatus tendon with 3.5 cm of retraction. Severe tendinosis of the infraspinatus tendon with a small interstitial  tear. Teres minor tendon is intact. Mild tendinosis of the subscapularis tendon.   Muscles: No muscle atrophy or edema. No intramuscular fluid collection or hematoma.   Biceps Long Head: Intra-articular portion of the long head of the biceps tendon is attenuated concerning for a complete tear.   Acromioclavicular Joint: Moderate arthropathy of the acromioclavicular joint. Trace subacromial/subdeltoid bursal fluid.   Glenohumeral Joint: Small joint effusion. Mild partial-thickness cartilage loss of the glenohumeral joint.   Labrum: Superior labral degeneration.   Bones: Nondisplaced impaction fracture of the superolateral humeral head involving the greater tuberosity with subcortical marrow edema.   Other: No fluid collection or hematoma.   IMPRESSION: 1. Complete tear of the supraspinatus tendon with 3.5 cm of retraction. 2. Severe tendinosis of the infraspinatus tendon with a small interstitial tear. 3. Mild tendinosis of the subscapularis tendon. 4. Intra-articular portion of the long head of the biceps tendon is attenuated concerning for a complete tear. 5. Nondisplaced impaction fracture of the superolateral humeral head involving the greater tuberosity with subcortical marrow edema.     Electronically Signed   By: Kathreen Devoid M.D.   On: 02/08/2023 09:51     States today that she does still have pain in her shoulder, states that it is worse at night but tolerable during the day.  Denies numbness or tingling.  States that she is  unable to continue follow-up with [facility] due to financial difficulties.  States that she has been out of her blood pressure medications for the past 4 days.  States that she does not check blood pressure at home.  Denies hypertensive symptoms.    Daughter is present and assists with interpretation.  Patient declines clinic interpreter.   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   Hypertension    Osteoarthritis of ankle, right    and Subtalar Joint   Osteoporosis    Presbyopia of both eyes 01/28/2017   Pterygium eye, 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   Social History   Socioeconomic History   Marital status: Married    Spouse name: Not on file   Number of children: Not on file   Years of education: Not on file   Highest education level: Not on file  Occupational History   Not on file  Tobacco Use   Smoking status: Never   Smokeless tobacco: Never  Vaping Use   Vaping Use: Never used  Substance and Sexual Activity   Alcohol use: No    Alcohol/week: 0.0 standard drinks of alcohol   Drug use: No   Sexual activity: Not Currently  Other Topics Concern   Not on file  Social History Narrative   Originally from [place].   Came to [place].  In 2005   Lives with her husband, her widowed daughter, and her daughter's 3 children.   This daughter's husband was tortured and killed by her other daughter's husband when he tried to help intervenn   Social Determinants of Radio broadcast assistant Strain: Not on file  Food Insecurity: Not on file  Transportation Needs: Not on file  Physical Activity: Not on file  Stress: Not on file  Social Connections: Not on file  Intimate Partner Violence: Not on file   History reviewed. No pertinent family history. No Known Allergies  Review of Systems  Constitutional: Negative.   HENT: Negative.    Eyes: Negative.   Respiratory:  Negative for shortness of breath.   Cardiovascular:  Negative for chest pain.  Gastrointestinal: Negative.   Genitourinary: Negative.   Musculoskeletal:  Positive for joint pain and myalgias.  Neurological: Negative.   Endo/Heme/Allergies: Negative.   Psychiatric/Behavioral: Negative.        Objective:     BP (!) 143/72 (BP Location: Left Arm,  Patient Position: Sitting, Cuff Size: Large)   Pulse 65   Ht '5\' 1"'$  (1.549 m)   Wt 189 lb (85.7 kg)   SpO2 97%   BMI 35.71 kg/m  BP Readings from Last 3 Encounters:  02/19/23 (!) 143/72  01/12/23 (!) 157/80  01/12/23 (!) 140/82   Wt Readings from Last 3 Encounters:  02/19/23 189 lb (85.7 kg)  01/12/23 180 lb (81.6 kg)  09/04/22 188 lb 3.2 oz (85.4 kg)      Physical Exam Vitals and nursing note reviewed.  Constitutional:      Appearance: Normal appearance.  HENT:     Head: Normocephalic and atraumatic.     Right Ear: External ear normal.     Left Ear: External ear normal.     Nose: Nose normal.     Mouth/Throat:     Mouth: Mucous membranes are  moist.     Pharynx: Oropharynx is clear.  Eyes:     Extraocular Movements: Extraocular movements intact.     Conjunctiva/sclera: Conjunctivae normal.     Pupils: Pupils are equal, round, and reactive to light.  Cardiovascular:     Rate and Rhythm: Normal rate.     Pulses: Normal pulses.     Heart sounds: Normal heart sounds.  Pulmonary:     Effort: Pulmonary effort is normal.     Breath sounds: Normal breath sounds.  Musculoskeletal:     Right shoulder: Normal.     Left shoulder: Bony tenderness present. No swelling. Decreased range of motion. Decreased strength.     Cervical back: Normal range of motion.     Comments: Pain elicited with ROM testing  Skin:    General: Skin is warm and dry.  Neurological:     General: No focal deficit present.     Mental Status: She is alert and oriented to person, place, and time.  Psychiatric:        Mood and Affect: Mood normal.        Behavior: Behavior normal.        Thought Content: Thought content normal.        Judgment: Judgment normal.        Assessment & Plan:   Problem List Items Addressed This Visit       Cardiovascular and Mediastinum   Hypertension   Relevant Medications   lisinopril-hydrochlorothiazide (ZESTORETIC) 20-25 MG tablet   metoprolol succinate  (TOPROL-XL) 25 MG 24 hr tablet   rosuvastatin (CRESTOR) 20 MG tablet     Other   Dyslipidemia   Relevant Medications   rosuvastatin (CRESTOR) 20 MG tablet   Other Visit Diagnoses     Traumatic complete tear of left rotator cuff, initial encounter    -  Primary   Relevant Medications   ibuprofen (ADVIL) 600 MG tablet   Other Relevant Orders   Ambulatory referral to Orthopedic Surgery     1. Traumatic complete tear of left rotator cuff, initial encounter Trial ibuprofen, patient referred to Ortho care.  Patient education given on supportive care.  Red flags given for prompt reevaluation - Ambulatory referral to Orthopedic Surgery - ibuprofen (ADVIL) 600 MG tablet; Take 1 tablet (600 mg total) by mouth every 8 (eight) hours as needed (pain).  Dispense: 30 tablet; Refill: 1  2. Primary hypertension Continue current regimen.  Patient encouraged to check blood pressure at home, keep a written log and have available for all office visits. - lisinopril-hydrochlorothiazide (ZESTORETIC) 20-25 MG tablet; TAKE 1 TABLET BY MOUTH DAILY.  Dispense: 90 tablet; Refill: 1 - metoprolol succinate (TOPROL-XL) 25 MG 24 hr tablet; TAKE 0.5 TABLETS (12.5 MG TOTAL) BY MOUTH DAILY.  Dispense: 180 tablet; Refill: 1  3. Dyslipidemia Continue current regimen - rosuvastatin (CRESTOR) 20 MG tablet; Take 1 tablet (20 mg total) by mouth daily.  Dispense: 90 tablet; Refill: 1   I have reviewed the patient's medical history (PMH, PSH, Social History, Family History, Medications, and allergies) , and have been updated if relevant. I spent 30 minutes reviewing chart and  face to face time with patient.     Return for [facility] with Dr. Margarita Rana.    Loraine Grip Mayers, PA-C

## 2023-02-19 NOTE — Patient Instructions (Signed)
I have started a referral for you to be seen by Ortho care.  They will contact you to set up an appointment.  I sent a refill of your blood pressure medications and cholesterol medication to the pharmacy.  I sent a prescription for ibuprofen 600 mg that you can use to help with your shoulder pain.  I do encourage you to check your blood pressure at home, keep a written log and have available for all office visits.  Kennieth Rad, PA-C Physician Assistant [facility] [email or web address]   Desgarro del manguito rotador Rotator Cuff Tear  Un desgarro del manguito rotador es un desgarro total o parcial de las bandas similares a cuerdas (tendones) que unen los msculos con el hueso en el manguito rotador. El [employer] rotador es un grupo de msculos y tendones que rodea la articulacin del hombro y sostiene el hueso del brazo superior (hmero) en la cavidad del hombro. El desgarro puede ocurrir de Geographical information systems officer sbita (desgarro agudo) o se puede manifestar durante un perodo prolongado (desgarro crnico). Cules son las causas? Las causas de los desgarros agudos pueden ser, [place], las siguientes: [place], especficamente sobre un brazo extendido. Levantar objetos muy pesados con un movimiento brusco. La causa de los desgarros crnicos puede ser el uso excesivo de los msculos. Los desgarros de este tipo pueden ser consecuencia de la realizacin de deportes, trabajo fsico o actividades que requieren un movimiento repetitivo del brazo por encima de la cabeza. Qu incrementa el riesgo? Es ms probable que esta afeccin ocurra en: Deportistas y trabajadores que usan con frecuencia el hombro o extienden el brazo por encima de la cabeza. Esto puede incluir actividades como, entre [place], las siguientes: Tenis. Bisbol y sftbol. Natacin y remo. Levantar peso. Trabajo de construccin. Trabajo de pintura. Las [employer]. Personas  mayores que tienen artritis o una deficiencia en la irrigacin sangunea. Estas afecciones pueden debilitar los msculos y los tendones. Cules son los signos o sntomas? Los sntomas de esta afeccin dependen del tipo y la gravedad de la lesin: Un desgarro agudo puede incluir una sensacin repentina de desgarro seguida de dolor intenso que comienza en la parte superior del hombro y baja por el brazo hacia el codo. Un desgarro crnico incluye debilidad gradual y disminucin del movimiento del hombro a Loss adjuster, chartered. El dolor generalmente empeora a la noche. Algunos sntomas que pueden aparecer con [employer] tipos de desgarro: Dolor que se extiende (irradia) desde el hombro hasta la parte superior del brazo. Dolor y Social research officer, government a la palpacin en la zona anterior del hombro. Disminucin de la amplitud de movimientos. Dolor al realizar lo siguiente: Extender, tirar o levantar el brazo por encima de la cabeza. Bajar el brazo desde una posicin encima de la cabeza. Imposibilidad de elevar el brazo [employer]. Dificultad para colocar el brazo detrs de la espalda. Cmo se diagnostica? Esta afeccin se diagnostica mediante una revisin de los antecedentes mdicos y un examen fsico. Se pueden realizar estudios de diagnstico por imgenes, por ejemplo: Radiografas. Resonancia magntica (RM). Una ecografa. Tomografa computada (TC) o artrografa por RM. Durante este estudio, se Probation officer de contraste en el hombro y luego se toman las imgenes. Cmo se trata? El tratamiento de esta afeccin depende del tipo y la gravedad. En casos no tan graves, el tratamiento puede incluir: Reposo. Esto se puede lograr con un cabestrillo que mantiene el hombro fijo (inmovilizacin). El mdico tambin puede recomendarle que evite realizar actividades que requieran  levantar el brazo por encima de la cabeza. Aplicar hielo en el hombro. Medicamentos antiinflamatorios, como aspirina o  ibuprofeno. Ejercicios de fortalecimiento y elongacin. Es posible que su mdico le recomiende hacer ejercicios especficos para mejorar la amplitud de movimiento y Tax adviser hombro. Cuando los casos son ms graves, el tratamiento puede incluir: Fisioterapia. Inyecciones de corticoesteroides. [place]. Siga estas instrucciones en su casa: Control del dolor, la rigidez y la hinchazn     Si se lo indican, aplique hielo sobre la zona de la lesin. Para hacer esto: Si tiene un cabestrillo desmontable, quteselo como se lo haya indicado el mdico. Ponga el hielo en una bolsa plstica. Coloque una toalla entre la piel y Therapist, nutritional. Aplique el hielo durante 20 minutos, 2 o 3 veces por da. Retire el hielo si la piel se pone de color rojo brillante. Esto es [employer]. Si no puede sentir dolor, calor o fro, tiene un mayor riesgo de que se dae la zona. Mantenga la zona de la lesin levantada (elevada) por encima del nivel del corazn mientras est acostado. Busque una posicin cmoda para dormir o duerma sobre un silln reclinable, si fuera posible. Mueva los dedos con frecuencia para reducir la rigidez y la hinchazn. Una vez que desaparezca la inflamacin, el mdico puede indicarle que se aplique calor para relajar los msculos. Use la fuente de calor que el mdico le recomiende, como una compresa de calor hmedo o una almohadilla trmica. Coloque una toalla entre la piel y la fuente de Freight forwarder. Aplique calor durante 20 a 30 minutos. Retire la fuente de calor si la piel se pone de color rojo brillante. Esto es especialmente importante si no puede sentir dolor, calor o fro. Corre un mayor riesgo de sufrir quemaduras. Si tiene un cabestrillo: selo como se lo haya indicado el mdico. Quteselo solamente como se lo haya indicado el mdico. Afloje el cabestrillo si los dedos de la mano se le entumecen, siente hormigueo o se le enfran y se ponen azules. Mantenga el cabestrillo limpio. Si el  cabestrillo no es impermeable: No deje que se moje. Cbralo con un envoltorio hermtico cuando tome un bao de inmersin o una ducha. Actividad Mantenga el hombro en reposo como se lo haya indicado el mdico. Retome sus actividades normales segn lo indicado por el mdico. Pregntele al mdico qu actividades son seguras para usted. Pregntele al mdico cundo podr volver a conducir si tiene un Pensions consultant. Haga ejercicios o estiramientos como se lo haya indicado el mdico. Instrucciones generales Use los medicamentos de venta libre y los recetados solamente como se lo haya indicado el mdico. No consuma ningn producto que contenga nicotina o tabaco, como cigarrillos, cigarrillos electrnicos y tabaco de Higher education careers adviser. Si necesita ayuda para dejar de fumar, consulte al mdico. Cumpla con todas las visitas de seguimiento. Esto es importante. Comunquese con un mdico si: El Holiday representative. Siente un dolor nuevo en el brazo, las manos o los dedos. Los medicamentos no Forensic psychologist. Solicite ayuda de inmediato si: El brazo, la mano o los dedos estn adormecidos o siente hormigueos. El brazo, la mano o los dedos estn hinchados, le duelen o se ven blancos o [place]. La mano o los dedos del brazo lesionado estn ms fros que la [place]. Resumen Un desgarro del manguito rotador es un desgarro total o parcial de las bandas similares a cuerdas (tendones) que unen los msculos con el hueso en el manguito rotador. El desgarro puede ocurrir de [place]  sbita (desgarro agudo) o se puede manifestar durante un perodo prolongado (desgarro crnico). El tratamiento generalmente incluye reposo, medicamentos antiinflamatorios y aplicacin de hielo. En algunos casos, puede ser necesario realizar sesiones de fisioterapia y recibir inyecciones de corticoesteroides. En casos graves, tal vez haya que realizar [place]. Esta informacin no tiene Marine scientist el consejo del mdico. Asegrese de  hacerle al mdico cualquier pregunta que tenga. Document Revised: 06/19/2020 Document Reviewed: 06/19/2020 Elsevier Patient Education  [place].

## 2023-02-25 ENCOUNTER — Encounter: Payer: Self-pay | Admitting: Primary Care

## 2023-02-25 DIAGNOSIS — Z1231 Encounter for screening mammogram for malignant neoplasm of breast: Secondary | ICD-10-CM

## 2023-02-27 ENCOUNTER — Encounter: Payer: Self-pay | Admitting: Orthopaedic Surgery

## 2023-02-27 ENCOUNTER — Ambulatory Visit (INDEPENDENT_AMBULATORY_CARE_PROVIDER_SITE_OTHER): Payer: Self-pay | Admitting: Orthopaedic Surgery

## 2023-02-27 DIAGNOSIS — S4292XA Fracture of left shoulder girdle, part unspecified, initial encounter for closed fracture: Secondary | ICD-10-CM

## 2023-02-27 NOTE — Progress Notes (Signed)
Office Visit Note   Patient: [name] [name]           Date of Birth: 20-Dec-1951           MRN: [id number] Visit Date: 02/27/2023              Requested by: Mayers, Loraine Grip, PA-C [id number] [place],  [place] [address] PCP: Kerin Perna, NP   Assessment & Plan: Visit Diagnoses:  1. Traumatic closed displaced fracture of left shoulder with anterior dislocation, initial encounter     Plan: Impression is 6 weeks status post left shoulder dislocation with traumatic supraspinatus tear.  At her age and given the amount of underlying tendinopathy of the tendon I think surgical repair has high risk for failure.  Based on these findings I would recommend rehabbing the shoulder with outpatient PT.  I would like to recheck her in 6 weeks.  This was explained with her daughter who served as the interpreter.  Follow-Up Instructions: Return in about 6 weeks (around 04/10/2023).   Orders:  Orders Placed This Encounter  Procedures   Ambulatory referral to Physical Therapy   No orders of the defined types were placed in this encounter.     Procedures: No procedures performed   Clinical Data: No additional findings.   Subjective: Chief Complaint  Patient presents with   Left Shoulder - Follow-up    MRI review    HPI  Patient is a 72 year old Hispanic female here with her daughter for left shoulder pain.  Had a dislocation about 6 weeks ago.  Continue to have pain so MRI was ordered which showed a traumatic supraspinatus tear with 3 and half centimeters of retraction.  She is here for further evaluation and treatment.  Review of Systems  Constitutional: Negative.   HENT: Negative.    Eyes: Negative.   Respiratory: Negative.    Cardiovascular: Negative.   Endocrine: Negative.   Musculoskeletal: Negative.   Neurological: Negative.   Hematological: Negative.   Psychiatric/Behavioral: Negative.    All other systems reviewed and are  negative.    Objective: Vital Signs: There were no vitals taken for this visit.  Physical Exam Vitals and nursing note reviewed.  Constitutional:      Appearance: She is well-developed.  HENT:     Head: Atraumatic.     Nose: Nose normal.  Eyes:     Extraocular Movements: Extraocular movements intact.  Cardiovascular:     Pulses: Normal pulses.  Pulmonary:     Effort: Pulmonary effort is normal.  Abdominal:     Palpations: Abdomen is soft.  Musculoskeletal:     Cervical back: Neck supple.  Skin:    General: Skin is warm.     Capillary Refill: Capillary refill takes less than 2 seconds.  Neurological:     Mental Status: She is alert. Mental status is at baseline.  Psychiatric:        Behavior: Behavior normal.        Thought Content: Thought content normal.        Judgment: Judgment normal.     Ortho Exam  Examination shows moderate pain with passive and active range of motion of the shoulder.  Axillary nerve intact.  Specialty Comments:  No specialty comments available.  Imaging: No results found.   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/2017   Post-traumatic osteoarthritis, right ankle and foot    Seasonal allergic rhinitis due to pollen 05/12/2017   Allergy    Cataract 01/28/2017   Presbyopia of both eyes 01/28/2017   Pterygium eye, 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   Astigmatism, 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   Hypermetropia, 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   Acquired bilateral flat feet 09/07/2015   Osteoporosis 09/07/2015   Elevated troponin 01/26/2015   Generalized anxiety disorder 01/26/2015   Hypertension 07/28/2014   Gastritis 07/28/2014   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   OBESITY 05/10/2009   NEPHROLITHIASIS 06/15/2008   Dyslipidemia 03/16/2008   OSTEOARTHRITIS,  LUMBAR SPINE 01/09/2008   BUNDLE BRANCH BLOCK, LEFT 12/11/2007   PTSD 10/01/2007   ARM PAIN, LEFT 10/01/2007   Past Medical History:  Diagnosis Date   Allergy    spring pollen   Arthritis    Astigmatism, 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   Atrial fibrillation ([place])    Cataract 01/28/2017   High cholesterol    History of kidney stones    Hypermetropia, 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   Hypertension    Osteoarthritis of ankle, right    and Subtalar Joint   Osteoporosis    Presbyopia of both eyes 01/28/2017   Pterygium eye, 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    No family history on file.  Past Surgical History:  Procedure Laterality Date   ANKLE FUSION Right 12/24/2017   Tibiocalcaneal fusion   ANKLE FUSION Right 12/24/2017   Procedure: RIGHT TIBIOCALCANEAL FUSION;  Surgeon: Newt Minion, MD;  Location: [facility];  Service: Orthopedics;  Laterality: Right;   CHOLECYSTECTOMY     Social History   Occupational History   Not on file  Tobacco Use   Smoking status: Never   Smokeless tobacco: Never  Vaping Use   Vaping Use: Never used  Substance and Sexual Activity   Alcohol use: No    Alcohol/week: 0.0 standard drinks of alcohol   Drug use: No   Sexual activity: Not Currently

## 2023-03-05 ENCOUNTER — Ambulatory Visit: Payer: Self-pay | Attending: Family Medicine | Admitting: Family Medicine

## 2023-03-10 NOTE — Therapy (Signed)
OUTPATIENT PHYSICAL THERAPY SHOULDER EVALUATION   Patient Name: [name] [name] MRN: [id number] DOB:11-Sep-1951, 72 y.o., female Today's Date: 03/11/2023  END OF SESSION:  PT End of Session - 03/11/23 0937     Visit Number 1    Number of Visits 17    Date for PT Re-Evaluation 05/06/23    Authorization Type none    PT Start Time 0940   late arrival   PT Stop Time 1016    PT Time Calculation (min) 36 min    Activity Tolerance Patient tolerated treatment well;No increased pain    Behavior During Therapy [facility] for tasks assessed/performed             Past Medical History:  Diagnosis Date   Allergy    spring pollen   Arthritis    Astigmatism, 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   Atrial fibrillation ([place])    Cataract 01/28/2017   High cholesterol    History of kidney stones    Hypermetropia, 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   Hypertension    Osteoarthritis of ankle, right    and Subtalar Joint   Osteoporosis    Presbyopia of both eyes 01/28/2017   Pterygium eye, 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   Past Surgical History:  Procedure Laterality Date   ANKLE FUSION Right 12/24/2017   Tibiocalcaneal fusion   ANKLE FUSION Right 12/24/2017   Procedure: RIGHT TIBIOCALCANEAL FUSION;  Surgeon: Newt Minion, MD;  Location: [place];  Service: Orthopedics;  Laterality: Right;   CHOLECYSTECTOMY     Patient Active Problem List   Diagnosis Date Noted   Atrial fibrillation ([place]) 10/01/2019   Atrial fibrillation with RVR ([place]) 10/01/2019   Osteopenia of multiple sites 02/05/2019   Hyperlipidemia 02/05/2019   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/2017   Post-traumatic osteoarthritis, right ankle and foot    Seasonal allergic rhinitis due to pollen 05/12/2017   Allergy    Cataract 01/28/2017   Presbyopia of both eyes 01/28/2017   Pterygium eye, 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   Astigmatism, 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   Hypermetropia, 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   Acquired bilateral flat feet 09/07/2015   Osteoporosis 09/07/2015   Elevated troponin  01/26/2015   Generalized anxiety disorder 01/26/2015   Hypertension 07/28/2014   Gastritis 07/28/2014   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   OBESITY 05/10/2009   NEPHROLITHIASIS 06/15/2008   Dyslipidemia 03/16/2008   OSTEOARTHRITIS, LUMBAR SPINE 01/09/2008   BUNDLE BRANCH BLOCK, LEFT 12/11/2007   PTSD 10/01/2007   ARM PAIN, LEFT 10/01/2007    PCP: Kerin Perna, NP  REFERRING PROVIDER: Leandrew Koyanagi, MD  REFERRING DIAG: S42.92XA (ICD-10-CM) - Traumatic closed displaced fracture of left shoulder with anterior dislocation, initial encounter  THERAPY DIAG:  Left shoulder pain, unspecified chronicity  Stiffness of left shoulder, not elsewhere classified  Muscle weakness (generalized)  Rationale for Evaluation and Treatment: Rehabilitation  ONSET DATE: 01/12/23 mechanical fall  SUBJECTIVE:  SUBJECTIVE STATEMENT: Appreciate assistance of in person interpreter throughout. Accompanied by daughter who also assists w/ history. Pt endorses fall in January, was walking down the stairs and tripped. Received imaging as below, was in a sling for ~2 weeks. Advised to still wear sling at night but hasn't been using it recently, instead preferring to position for comfort. Denies any official restrictions - states she was advised to move arm but avoid significant pain. Initially was requiring a lot of assist for ADLs, has since improved independence but still has some limitations. Pt is right handed. All pain in shoulder, no UE referral or n/t  PERTINENT HISTORY:  afib, HTN, osteoporosis  PAIN:  Are you having pain: none Location/description: L shoulder, lateral/superior Best-worst over past week: 0-6/10  Per eval -  - aggravating factors: mostly at night or when moving  - Easing factors: medication, change in  positioning, rest  PRECAUTIONS: no precautions in chart review or per discussion w/ pt (>8 weeks out from fall)  WEIGHT BEARING RESTRICTIONS: No  FALLS:  Has patient fallen in last 6 months? Yes. Number of falls "a couple" due to prior leg weakness  LIVING ENVIRONMENT: With husband and daughters - has 24/7 assist as needed. 1 level home with porch, able to navigate stairs w/ UE assist  OCCUPATION: Retired   PLOF: Independent - had some prior issues with knees and ankle   PATIENT GOALS: less pain, be able to use L arm more  NEXT MD VISIT: TBD - possibly 04/11/23 per daughter   OBJECTIVE:   DIAGNOSTIC FINDINGS:  02/08/23 L shoulder MRI:  IMPRESSION: 1. Complete tear of the supraspinatus tendon with 3.5 cm of retraction. 2. Severe tendinosis of the infraspinatus tendon with a small interstitial tear. 3. Mild tendinosis of the subscapularis tendon. 4. Intra-articular portion of the long head of the biceps tendon is attenuated concerning for a complete tear. 5. Nondisplaced impaction fracture of the superolateral humeral head involving the greater tuberosity with subcortical marrow edema.  PATIENT SURVEYS:  FOTO: deferred on eval given time constraints/language, plan to administer as appropriate   COGNITION: Overall cognitive status: Within functional limits for tasks assessed, pleasant and conversationally appropriate, does endorse some mild memory deficits     SENSATION: No neuro complaints - light touch intact throughout B UE  POSTURE: Guarded L UE, mild fwd head and kyphosis   UPPER EXTREMITY ROM:  A/PROM Right eval Left Eval AROM  Shoulder flexion 165 deg  100 deg   Shoulder abduction 158 deg 92 deg   Shoulder internal rotation (functional)  C7 NT  Shoulder external rotation (functional) T12 NT  Elbow flexion    Elbow extension    Wrist flexion    Wrist extension     (Blank rows = not tested) (Key: WFL = within functional limits not formally assessed, * =  concordant pain, s = stiffness/stretching sensation, NT = not tested)  Comments: passive ROM approximately equivalent to AROM - limited assessment by muscle guarding  UPPER EXTREMITY MMT:  MMT Right eval Left eval  Shoulder flexion    Shoulder extension    Shoulder abduction    Shoulder extension    Shoulder internal rotation    Shoulder external rotation    Elbow flexion    Elbow extension    Grip strength    (Blank rows = not tested)  (Key: WFL = within functional limits not formally assessed, * = concordant pain, s = stiffness/stretching sensation, NT = not tested)  Comments: deferred on  evaluation  PALPATION:  Notable tightness throughout L UT, LS, deltoid, and lat but pt denies overt tenderness. No bony tenderness or gross deformities on palpation    TODAY'S TREATMENT:                                                                                                                                         [facility] Adult PT Treatment:                                                DATE: 03/11/23 Deferred on eval given time constraints  PATIENT EDUCATION: Education details: Pt education on PT impairments, prognosis, and POC. Informed consent. Rationale for interventions, safe/appropriate movement at home, monitoring symptoms Person educated: Patient and daughter Education method: Explanation, Demonstration, Tactile cues, Verbal cues Education comprehension: verbalized understanding, returned demonstration, verbal cues required, tactile cues required, and needs further education    HOME EXERCISE PROGRAM: Deferred on eval given time constraints  ASSESSMENT:  CLINICAL IMPRESSION: Patient is a pleasant 72 y.o. woman who was seen today for physical therapy evaluation and treatment for L shoulder pain s/p mechanical fall with anterior dislocation (reduced) and rotator cuff tear. Incident occurred in January 2024, pt >8 weeks out from initial fall and reduction, denies MD restrictions. Pt  endorses pain mostly at night and transiently with movement, today demonstrates active and passive limitations of L GH mobility compared to R, no overt TTP or bony deformities noted. Denies any pain on arrival but does endorse feeling improved after mobility during exam. No concern for red flags on today's exam, reportedly progressing well since initial incident. Unable to prescribe HEP or administer FOTO due to time constraints (late check in) but will plan to administer at next visit as able. No adverse events. Recommend skilled PT to address aforementioned deficits and maximize functional independence, pt/daughter in agreement with plan as described below. Pt departs today's session in no acute distress, all voiced questions/concerns addressed appropriately from PT perspective.    OBJECTIVE IMPAIRMENTS: decreased activity tolerance, decreased endurance, decreased mobility, decreased ROM, decreased strength, hypomobility, impaired UE functional use, postural dysfunction, and pain.   ACTIVITY LIMITATIONS: carrying, lifting, bathing, dressing, reach over head, and hygiene/grooming  PARTICIPATION LIMITATIONS: meal prep, cleaning, and laundry  PERSONAL FACTORS: Age and 3+ comorbidities: afib, HTN, osteoporosis  are also affecting patient's functional outcome.   REHAB POTENTIAL: Good  CLINICAL DECISION MAKING: Stable/uncomplicated  EVALUATION COMPLEXITY: Low   GOALS: Goals reviewed with patient? No given time constraints  SHORT TERM GOALS: Target date: 04/08/2023  Pt will demonstrate appropriate understanding and performance of initially prescribed HEP in order to facilitate improved independence with management of symptoms.  Baseline: HEP TBD Goal status: INITIAL   2. Pt will improve greater than or equal  to MCID on FOTO in order to demonstrate improved perception of function due to symptoms.  Baseline: FOTO TBD   Goal status: INITIAL    LONG TERM GOALS: Target date: 05/06/2023  Pt will  meet predicted score on FOTO in order to demonstrate improved perception of function due to symptoms. Baseline: FOTO TBD Goal status: INITIAL  2.  Pt will demonstrate at least 140 degrees of active shoulder elevation in order to demonstrate improved tolerance to functional movement patterns such as reaching overhead and upper body dressing.  Baseline: see ROM chart above Goal status: INITIAL  3.  Pt will demonstrate at least 4/5 shoulder flexion MMT for improved symmetry of UE strength and improved tolerance to functional movements.  Baseline: MMT deferred on eval given proximity to injury Goal status: INITIAL  4. Pt will report ability to perform upper body dressing with less than 2 point increase in pain on NPS in order to indicate improved tolerance to ADLs.  Baseline: 0-6/10 pain with ADLs  Goal status: INITIAL   5. Pt will demonstrate appropriate performance of final prescribed HEP in order to facilitate improved self-management of symptoms post-discharge.   Baseline: HEP TBD  Goal status: INITIAL    6. Pt will endorse at least 50% reduction in overall pain levels to facilitate improved tolerance to daily activities.   Baseline: 0-6/10 on NPS  Goal status: INITIAL   PLAN:  PT FREQUENCY: 2x/week  PT DURATION: 8 weeks  PLANNED INTERVENTIONS: Therapeutic exercises, Therapeutic activity, Neuromuscular re-education, Balance training, Gait training, Patient/Family education, Self Care, Joint mobilization, Aquatic Therapy, Dry Needling, Electrical stimulation, Spinal mobilization, Cryotherapy, Moist heat, Taping, Vasopneumatic device, Manual therapy, and Re-evaluation  PLAN FOR NEXT SESSION: establish HEP with emphasis on PROM/AAROM, scapular positioning. Manual PRN for mobility.    Leeroy Cha PT, DPT 03/11/2023 10:56 AM

## 2023-03-11 ENCOUNTER — Encounter: Payer: Self-pay | Admitting: Physical Therapy

## 2023-03-11 ENCOUNTER — Other Ambulatory Visit: Payer: Self-pay

## 2023-03-11 ENCOUNTER — Ambulatory Visit: Payer: Self-pay | Attending: Physician Assistant | Admitting: Physical Therapy

## 2023-03-11 DIAGNOSIS — M6281 Muscle weakness (generalized): Secondary | ICD-10-CM | POA: Insufficient documentation

## 2023-03-11 DIAGNOSIS — M25512 Pain in left shoulder: Secondary | ICD-10-CM | POA: Insufficient documentation

## 2023-03-11 DIAGNOSIS — M25612 Stiffness of left shoulder, not elsewhere classified: Secondary | ICD-10-CM | POA: Insufficient documentation

## 2023-03-24 NOTE — Therapy (Signed)
OUTPATIENT PHYSICAL THERAPY TREATMENT NOTE   Patient Name: [name] [name] MRN: [id number] DOB:Jul 02, 1951, 72 y.o., female Today's Date: 03/25/2023  PCP: Kerin Perna, NP  REFERRING PROVIDER: Leandrew Koyanagi, MD   END OF SESSION:   PT End of Session - 03/25/23 0929     Visit Number 2    Number of Visits 17    Date for PT Re-Evaluation 05/06/23    Authorization Type none    PT Start Time 0930    PT Stop Time 1010    PT Time Calculation (min) 40 min    Activity Tolerance Patient tolerated treatment well;No increased pain    Behavior During Therapy [facility] for tasks assessed/performed             Past Medical History:  Diagnosis Date   Allergy    spring pollen   Arthritis    Astigmatism, 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   Atrial fibrillation    Cataract 01/28/2017   High cholesterol    History of kidney stones    Hypermetropia, 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   Hypertension    Osteoarthritis of ankle, right    and Subtalar Joint   Osteoporosis    Presbyopia of both eyes 01/28/2017   Pterygium eye, 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   Past Surgical History:  Procedure Laterality Date   ANKLE FUSION Right 12/24/2017   Tibiocalcaneal fusion   ANKLE FUSION Right 12/24/2017   Procedure: RIGHT TIBIOCALCANEAL FUSION;  Surgeon: Newt Minion, MD;  Location: [facility];  Service: Orthopedics;  Laterality: Right;   CHOLECYSTECTOMY     Patient Active Problem List   Diagnosis Date Noted   Atrial fibrillation 10/01/2019   Atrial fibrillation with RVR 10/01/2019   Osteopenia of multiple sites 02/05/2019   Hyperlipidemia 02/05/2019   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/2017   Post-traumatic osteoarthritis, right ankle and foot    Seasonal allergic rhinitis due to pollen 05/12/2017   Allergy    Cataract 01/28/2017   Presbyopia of both eyes 01/28/2017   Pterygium eye, 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   Astigmatism, 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   Hypermetropia, 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   Acquired bilateral flat feet 09/07/2015   Osteoporosis  09/07/2015   Elevated troponin 01/26/2015   Generalized anxiety disorder 01/26/2015   Hypertension 07/28/2014   Gastritis 07/28/2014   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   OBESITY 05/10/2009   NEPHROLITHIASIS 06/15/2008   Dyslipidemia 03/16/2008   OSTEOARTHRITIS, LUMBAR SPINE 01/09/2008   BUNDLE BRANCH BLOCK, LEFT 12/11/2007   PTSD 10/01/2007   ARM PAIN, LEFT 10/01/2007    REFERRING DIAG:  [id number] (ICD-10-CM) - Traumatic closed displaced fracture of left shoulder with anterior dislocation, initial encounter   THERAPY DIAG:  Left shoulder pain, unspecified chronicity  Stiffness of left shoulder, not elsewhere classified  Muscle weakness (generalized)  Rationale for Evaluation and Treatment Rehabilitation  PERTINENT HISTORY: afib, HTN, osteoporosis; fall on 01/12/23  PRECAUTIONS: no precautions in chart review or per discussion w/ pt (>8 weeks out from fall); hx of falls  SUBJECTIVE:  SUBJECTIVE STATEMENT:   03/25/2023 Pt arrives w/ report of worsening pain over past couple of weeks, mostly with shoulder extension. Denies any new injuries or falls, no noted change in activities. Sees MD April 15th, pt is accompanied by her daughter and in person interpreter.   PAIN:  Are you having pain: 03/25/2023 none while not moving it, unrated pain with movement Location/description: L shoulder, lateral/superior Best-worst over past week: 0-6/10  Per eval -  - aggravating factors: mostly at night or when moving  - Easing factors: medication, change in positioning, rest   OBJECTIVE: (objective measures completed at initial evaluation unless otherwise dated)   DIAGNOSTIC FINDINGS:  02/08/23 L shoulder MRI:  IMPRESSION: 1. Complete tear of the supraspinatus tendon with 3.5 cm of retraction. 2. Severe tendinosis of  the infraspinatus tendon with a small interstitial tear. 3. Mild tendinosis of the subscapularis tendon. 4. Intra-articular portion of the long head of the biceps tendon is attenuated concerning for a complete tear. 5. Nondisplaced impaction fracture of the superolateral humeral head involving the greater tuberosity with subcortical marrow edema.   PATIENT SURVEYS:  FOTO: deferred on eval given time constraints/language, plan to administer as appropriate    COGNITION: Overall cognitive status: Within functional limits for tasks assessed, pleasant and conversationally appropriate, does endorse some mild memory deficits                                  SENSATION: No neuro complaints - light touch intact throughout B UE   POSTURE: Guarded L UE, mild fwd head and kyphosis    UPPER EXTREMITY ROM:   A/PROM Right eval Left Eval AROM  Shoulder flexion 165 deg  100 deg   Shoulder abduction 158 deg 92 deg   Shoulder internal rotation (functional)  C7 NT  Shoulder external rotation (functional) T12 NT  Elbow flexion      Elbow extension      Wrist flexion      Wrist extension       (Blank rows = not tested) (Key: WFL = within functional limits not formally assessed, * = concordant pain, s = stiffness/stretching sensation, NT = not tested)  Comments: passive ROM approximately equivalent to AROM - limited assessment by muscle guarding   UPPER EXTREMITY MMT:   MMT Right eval Left eval  Shoulder flexion      Shoulder extension      Shoulder abduction      Shoulder extension      Shoulder internal rotation      Shoulder external rotation      Elbow flexion      Elbow extension      Grip strength      (Blank rows = not tested)  (Key: WFL = within functional limits not formally assessed, * = concordant pain, s = stiffness/stretching sensation, NT = not tested)  Comments: deferred on evaluation   PALPATION:  Notable tightness throughout L UT, LS, deltoid, and lat but pt denies  overt tenderness. No bony tenderness or gross deformities on palpation              TODAY'S TREATMENT:  [facility] Adult PT Treatment:                                                DATE: 03/25/23 Therapeutic Exercise: LS stretch to R, 3x30sec cues for appropriate ROM  UT stretch to R 3x30sec cues for appropriate ROM  Scapular retraction 2x10 cues for reduced compensations at UT and elbow  Standing swiss ball rollout fwd only 2x10 cues for appropriate ROM and pacing Shoulder flexion walkbacks at counter x10 HEP handout + education, significant time spent on relevant anatomy/physiology and ensuring appropriate performance of activity    PATIENT EDUCATION: Education details: rationale for interventions, HEP, importance of activity modification based on symptoms Person educated: Patient and daughter Education method: Explanation, Demonstration, Tactile cues, Verbal cues Education comprehension: verbalized understanding, returned demonstration, verbal cues required, tactile cues required, and needs further education     HOME EXERCISE PROGRAM: Access Code: [email or web address] URL: [email or web address] Date: 03/25/2023 Prepared by: Enis Slipper  Exercises - Seated Shoulder Flexion Towel Slide at Table Top  - 1 x daily - 7 x weekly - 2 sets - 10 reps - Gentle Levator Scapulae Stretch  - 1 x daily - 7 x weekly - 1-3 sets - 1-3 reps - 30sec hold   ASSESSMENT:   CLINICAL IMPRESSION: 03/25/2023 Pt arrives today with report of increased pain over past couple of weeks, cannot recall any specific change in activities or injuries. Pt with initial discomfort for majority of exercises today but with repetition and cues to mitigate compensations, consistently improves tolerance and reduces pain with repetition. HEP provided as above, significant time spent educating on  appropriate performance and monitoring symptoms. Sessions continue to improve as session goes on, pain unrated but described as improved on departure. No adverse events, appreciate assistance of in person interpreter and daughter throughout. Recommend continuing along current POC in order to address relevant deficits and improve functional tolerance. Pt departs today's session in no acute distress, all voiced questions/concerns addressed appropriately from PT perspective.    Eval - Patient is a pleasant 72 y.o. woman who was seen today for physical therapy evaluation and treatment for L shoulder pain s/p mechanical fall with anterior dislocation (reduced) and rotator cuff tear. Incident occurred in January 2024, pt >8 weeks out from initial fall and reduction, denies MD restrictions. Pt endorses pain mostly at night and transiently with movement, today demonstrates active and passive limitations of L GH mobility compared to R, no overt TTP or bony deformities noted. Denies any pain on arrival but does endorse feeling improved after mobility during exam. No concern for red flags on today's exam, reportedly progressing well since initial incident. Unable to prescribe HEP or administer FOTO due to time constraints (late check in) but will plan to administer at next visit as able. No adverse events. Recommend skilled PT to address aforementioned deficits and maximize functional independence, pt/daughter in agreement with plan as described below. Pt departs today's session in no acute distress, all voiced questions/concerns addressed appropriately from PT perspective.     OBJECTIVE IMPAIRMENTS: decreased activity tolerance, decreased endurance, decreased mobility, decreased ROM, decreased strength, hypomobility, impaired UE functional use, postural dysfunction, and pain.    ACTIVITY LIMITATIONS: carrying, lifting, bathing, dressing, reach over head, and hygiene/grooming   PARTICIPATION LIMITATIONS: meal prep,  cleaning, and laundry   PERSONAL FACTORS: Age and 3+ comorbidities: afib, HTN, osteoporosis  are also affecting patient's functional outcome.    REHAB POTENTIAL: Good   CLINICAL DECISION MAKING: Stable/uncomplicated   EVALUATION COMPLEXITY: Low     GOALS: Goals reviewed with patient? No given time constraints   SHORT TERM GOALS: Target date: 04/08/2023 Pt will demonstrate appropriate understanding and performance of initially prescribed HEP in order to facilitate improved independence with management of symptoms.  Baseline: HEP TBD Goal status: INITIAL    2. Pt will improve greater than or equal to MCID on FOTO in order to demonstrate improved perception of function due to symptoms.            Baseline: FOTO TBD             Goal status: INITIAL     LONG TERM GOALS: Target date: 05/06/2023   Pt will meet predicted score on FOTO in order to demonstrate improved perception of function due to symptoms. Baseline: FOTO TBD Goal status: INITIAL   2.  Pt will demonstrate at least 140 degrees of active shoulder elevation in order to demonstrate improved tolerance to functional movement patterns such as reaching overhead and upper body dressing.  Baseline: see ROM chart above Goal status: INITIAL   3.  Pt will demonstrate at least 4/5 shoulder flexion MMT for improved symmetry of UE strength and improved tolerance to functional movements.  Baseline: MMT deferred on eval given proximity to injury Goal status: INITIAL   4. Pt will report ability to perform upper body dressing with less than 2 point increase in pain on NPS in order to indicate improved tolerance to ADLs.            Baseline: 0-6/10 pain with ADLs            Goal status: INITIAL    5. Pt will demonstrate appropriate performance of final prescribed HEP in order to facilitate improved self-management of symptoms post-discharge.             Baseline: HEP TBD            Goal status: INITIAL     6. Pt will endorse at least  50% reduction in overall pain levels to facilitate improved tolerance to daily activities.             Baseline: 0-6/10 on NPS            Goal status: INITIAL    PLAN:   PT FREQUENCY: 2x/week   PT DURATION: 8 weeks   PLANNED INTERVENTIONS: Therapeutic exercises, Therapeutic activity, Neuromuscular re-education, Balance training, Gait training, Patient/Family education, Self Care, Joint mobilization, Aquatic Therapy, Dry Needling, Electrical stimulation, Spinal mobilization, Cryotherapy, Moist heat, Taping, Vasopneumatic device, Manual therapy, and Re-evaluation   PLAN FOR NEXT SESSION: Review/update HEP PRN. Continue with neck/shoulder mobility as able/tolerated, education on appropriate symptom monitoring/tolerance with activity    Leeroy Cha PT, DPT 03/25/2023 12:42 PM

## 2023-03-25 ENCOUNTER — Ambulatory Visit: Payer: Self-pay | Attending: Physician Assistant | Admitting: Physical Therapy

## 2023-03-25 ENCOUNTER — Encounter: Payer: Self-pay | Admitting: Physical Therapy

## 2023-03-25 DIAGNOSIS — S4292XA Fracture of left shoulder girdle, part unspecified, initial encounter for closed fracture: Secondary | ICD-10-CM | POA: Insufficient documentation

## 2023-03-25 DIAGNOSIS — M25512 Pain in left shoulder: Secondary | ICD-10-CM | POA: Insufficient documentation

## 2023-03-25 DIAGNOSIS — M25612 Stiffness of left shoulder, not elsewhere classified: Secondary | ICD-10-CM | POA: Insufficient documentation

## 2023-03-25 DIAGNOSIS — M6281 Muscle weakness (generalized): Secondary | ICD-10-CM | POA: Insufficient documentation

## 2023-03-28 ENCOUNTER — Ambulatory Visit: Payer: Self-pay | Admitting: Physical Therapy

## 2023-03-28 ENCOUNTER — Ambulatory Visit: Payer: Self-pay

## 2023-03-28 DIAGNOSIS — M25612 Stiffness of left shoulder, not elsewhere classified: Secondary | ICD-10-CM

## 2023-03-28 DIAGNOSIS — M25512 Pain in left shoulder: Secondary | ICD-10-CM

## 2023-03-28 DIAGNOSIS — M6281 Muscle weakness (generalized): Secondary | ICD-10-CM

## 2023-03-28 NOTE — Therapy (Signed)
OUTPATIENT PHYSICAL THERAPY TREATMENT NOTE   Patient Name: [name] JarvisMaria [name] Vazquez Garcia MRN: [id number] DOB:06/16/1951, 72 y.o., female Today's Date: 03/28/2023  PCP: Grayce SessionsEdwards, Michelle P, NP  REFERRING PROVIDER: Tarry KosXu, Naiping M, MD   END OF SESSION:   PT End of Session - 03/28/23 1213     Visit Number 3    Number of Visits 17    PT Start Time 1215    PT Stop Time 1300    PT Time Calculation (min) 45 min    Activity Tolerance Patient tolerated treatment well;No increased pain    Behavior [facility] for tasks assessed/performed             Past Medical History:  Diagnosis Date   Allergy    spring pollen   Arthritis    Astigmatism, 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   Atrial fibrillation    Cataract 01/28/2017   High cholesterol    History of kidney stones    Hypermetropia, 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   Hypertension    Osteoarthritis of ankle, right    and Subtalar Joint   Osteoporosis    Presbyopia of both eyes 01/28/2017   Pterygium eye, 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   Past Surgical History:  Procedure Laterality Date   ANKLE FUSION Right 12/24/2017   Tibiocalcaneal fusion   ANKLE FUSION Right 12/24/2017   Procedure: RIGHT TIBIOCALCANEAL FUSION;  Surgeon: Nadara Mustarduda, Marcus V, MD;  [facility];  Service: Orthopedics;  Laterality: Right;   CHOLECYSTECTOMY     Patient Active Problem List   Diagnosis Date Noted   Atrial fibrillation 10/01/2019   Atrial fibrillation with RVR 10/01/2019   Osteopenia of multiple sites 02/05/2019   Hyperlipidemia 02/05/2019   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/2017   Post-traumatic osteoarthritis, right ankle and foot    Seasonal allergic rhinitis due to pollen 05/12/2017   Allergy    Cataract 01/28/2017   Presbyopia of both eyes 01/28/2017   Pterygium eye, 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   Astigmatism, 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   Hypermetropia, 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   Acquired bilateral flat feet 09/07/2015   Osteoporosis 09/07/2015   Elevated troponin 01/26/2015   Generalized anxiety  disorder 01/26/2015   Hypertension 07/28/2014   Gastritis 07/28/2014   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   OBESITY 05/10/2009   NEPHROLITHIASIS 06/15/2008   Dyslipidemia 03/16/2008   OSTEOARTHRITIS, LUMBAR SPINE 01/09/2008   BUNDLE BRANCH BLOCK, LEFT 12/11/2007   PTSD 10/01/2007   ARM PAIN, LEFT 10/01/2007    REFERRING DIAG:  N56S42.92XA (ICD-10-CM) - Traumatic closed displaced fracture of left shoulder with anterior dislocation, initial encounter   THERAPY DIAG:  Left shoulder pain, unspecified chronicity  Stiffness of left shoulder, not elsewhere classified  Muscle weakness (generalized)  Rationale for Evaluation and Treatment Rehabilitation  PERTINENT HISTORY: afib, HTN, osteoporosis; fall on 01/12/23  PRECAUTIONS: no precautions in chart review or per discussion w/ pt (>8 weeks out from fall); hx of falls  SUBJECTIVE:  SUBJECTIVE STATEMENT:   03/28/2023 Patient states that she felt better after the last session. Denies any new injuries or falls, no noted change in activities. Sees MD April 15th, pt is accompanied by her daughter and in person interpreter.   PAIN:  Are you having pain: 03/28/2023 none while not moving it, unrated pain with movement Location/description: L shoulder, lateral/superior Best-worst over past week: 0-6/10  Per eval -  - aggravating factors: mostly at night or when moving  - Easing factors: medication, change in positioning, rest   OBJECTIVE: (objective measures completed at initial evaluation unless otherwise dated)   DIAGNOSTIC FINDINGS:  02/08/23 L shoulder MRI:  IMPRESSION: 1. Complete tear of the supraspinatus tendon with 3.5 cm of retraction. 2. Severe tendinosis of the infraspinatus tendon with a small interstitial tear. 3. Mild tendinosis of the subscapularis  tendon. 4. Intra-articular portion of the long head of the biceps tendon is attenuated concerning for a complete tear. 5. Nondisplaced impaction fracture of the superolateral humeral head involving the greater tuberosity with subcortical marrow edema.   PATIENT SURVEYS:  FOTO: deferred on eval given time constraints/language, plan to administer as appropriate    COGNITION: Overall cognitive status: Within functional limits for tasks assessed, pleasant and conversationally appropriate, does endorse some mild memory deficits                                  SENSATION: No neuro complaints - light touch intact throughout B UE   POSTURE: Guarded L UE, mild fwd head and kyphosis    UPPER EXTREMITY ROM:   A/PROM Right eval Left Eval AROM  Shoulder flexion 165 deg  100 deg   Shoulder abduction 158 deg 92 deg   Shoulder internal rotation (functional)  C7 NT  Shoulder external rotation (functional) T12 NT  Elbow flexion      Elbow extension      Wrist flexion      Wrist extension       (Blank rows = not tested) (Key: WFL = within functional limits not formally assessed, * = concordant pain, s = stiffness/stretching sensation, NT = not tested)  Comments: passive ROM approximately equivalent to AROM - limited assessment by muscle guarding   UPPER EXTREMITY MMT:   MMT Right eval Left eval  Shoulder flexion      Shoulder extension      Shoulder abduction      Shoulder extension      Shoulder internal rotation      Shoulder external rotation      Elbow flexion      Elbow extension      Grip strength      (Blank rows = not tested)  (Key: WFL = within functional limits not formally assessed, * = concordant pain, s = stiffness/stretching sensation, NT = not tested)  Comments: deferred on evaluation   PALPATION:  Notable tightness throughout L UT, LS, deltoid, and lat but pt denies overt tenderness. No bony tenderness or gross deformities on palpation              [facility]  Treatment:                                                DATE: 03/28/2023  Therapeutic Exercise: LS stretch to  R, 3x30sec verbal and visual cues for appropriate ROM  UT stretch to R 3x30sec verbal and visual cues for appropriate ROM  Scapular retraction 1x10 cues for reduced compensations at UT and elbow  Resisted Rows with RTB, 2 x 10  Standing swiss ball rollout fwd only x20 cues for appropriate ROM and pacing UBE 3' fwd/3' backward, level 1 at self-selected pace  Manual Therapy: Long Axis Distraction to alleviate impingement symptoms PROM in flexion/scaption  Soft Tissue Mobilization along deltoid, UT and periscapular musculature along posterior aspect of shoulder   TODAY'S TREATMENT:                                                                                                                                         [facility] Adult PT Treatment:                                                DATE: 03/25/23 Therapeutic Exercise: LS stretch to R, 3x30sec cues for appropriate ROM  UT stretch to R 3x30sec cues for appropriate ROM  Scapular retraction 2x10 cues for reduced compensations at UT and elbow, 3 sec hold  Standing swiss ball rollout fwd only 2x10 cues, 3 sec hold HEP handout + education, significant time spent on relevant anatomy/physiology and ensuring appropriate performance of activity    PATIENT EDUCATION: Education details: rationale for interventions, HEP, importance of activity modification based on symptoms Person educated: Patient and daughter Education method: Explanation, Demonstration, Tactile cues, Verbal cues Education comprehension: verbalized understanding, returned demonstration, verbal cues required, tactile cues required, and needs further education     HOME EXERCISE PROGRAM: Access Code: [email or web address] URL: [email or web address] Date: 03/25/2023 Prepared by: Fransisco Hertz  Exercises - Seated Shoulder Flexion Towel Slide at Table Top  - 1 x daily - 7  x weekly - 2 sets - 10 reps - Gentle Levator Scapulae Stretch  - 1 x daily - 7 x weekly - 1-3 sets - 1-3 reps - 30sec hold   ASSESSMENT:   CLINICAL IMPRESSION: 03/28/2023 Patient was able to perform all therapeutic exercises today without exacerbation of symptoms. She reports only fatigue of triceps mm at end of session. She had improved R shoulder AROM in flexion following manual therapy. She continues to require frequent verbal and visual cues for correct performance of cervical stretches. We will continue to assess response to treatment and progress as tolerated.   Eval - Patient is a pleasant 72 y.o. woman who was seen today for physical therapy evaluation and treatment for L shoulder pain s/p mechanical fall with anterior dislocation (reduced) and rotator cuff tear. Incident occurred in January 2024, pt >8 weeks out from initial fall and reduction, denies MD restrictions. Pt endorses pain mostly at night and transiently with movement, today demonstrates  active and passive limitations of L GH mobility compared to R, no overt TTP or bony deformities noted. Denies any pain on arrival but does endorse feeling improved after mobility during exam. No concern for red flags on today's exam, reportedly progressing well since initial incident. Unable to prescribe HEP or administer FOTO due to time constraints (late check in) but will plan to administer at next visit as able. No adverse events. Recommend skilled PT to address aforementioned deficits and maximize functional independence, pt/daughter in agreement with plan as described below. Pt departs today's session in no acute distress, all voiced questions/concerns addressed appropriately from PT perspective.     OBJECTIVE IMPAIRMENTS: decreased activity tolerance, decreased endurance, decreased mobility, decreased ROM, decreased strength, hypomobility, impaired UE functional use, postural dysfunction, and pain.    ACTIVITY LIMITATIONS: carrying, lifting,  bathing, dressing, reach over head, and hygiene/grooming   PARTICIPATION LIMITATIONS: meal prep, cleaning, and laundry   PERSONAL FACTORS: Age and 3+ comorbidities: afib, HTN, osteoporosis  are also affecting patient's functional outcome.    REHAB POTENTIAL: Good   CLINICAL DECISION MAKING: Stable/uncomplicated   EVALUATION COMPLEXITY: Low     GOALS: Goals reviewed with patient? No given time constraints   SHORT TERM GOALS: Target date: 04/08/2023 Pt will demonstrate appropriate understanding and performance of initially prescribed HEP in order to facilitate improved independence with management of symptoms.  Baseline: HEP TBD Goal status: INITIAL    2. Pt will improve greater than or equal to MCID on FOTO in order to demonstrate improved perception of function due to symptoms.            Baseline: FOTO TBD             Goal status: INITIAL     LONG TERM GOALS: Target date: 05/06/2023   Pt will meet predicted score on FOTO in order to demonstrate improved perception of function due to symptoms. Baseline: FOTO TBD Goal status: INITIAL   2.  Pt will demonstrate at least 140 degrees of active shoulder elevation in order to demonstrate improved tolerance to functional movement patterns such as reaching overhead and upper body dressing.  Baseline: see ROM chart above Goal status: INITIAL   3.  Pt will demonstrate at least 4/5 shoulder flexion MMT for improved symmetry of UE strength and improved tolerance to functional movements.  Baseline: MMT deferred on eval given proximity to injury Goal status: INITIAL   4. Pt will report ability to perform upper body dressing with less than 2 point increase in pain on NPS in order to indicate improved tolerance to ADLs.            Baseline: 0-6/10 pain with ADLs            Goal status: INITIAL    5. Pt will demonstrate appropriate performance of final prescribed HEP in order to facilitate improved self-management of symptoms post-discharge.              Baseline: HEP TBD            Goal status: INITIAL     6. Pt will endorse at least 50% reduction in overall pain levels to facilitate improved tolerance to daily activities.             Baseline: 0-6/10 on NPS            Goal status: INITIAL    PLAN:   PT FREQUENCY: 2x/week   PT DURATION: 8 weeks   PLANNED INTERVENTIONS: Therapeutic exercises, Therapeutic  activity, Neuromuscular re-education, Balance training, Gait training, Patient/Family education, Self Care, Joint mobilization, Aquatic Therapy, Dry Needling, Electrical stimulation, Spinal mobilization, Cryotherapy, Moist heat, Taping, Vasopneumatic device, Manual therapy, and Re-evaluation   PLAN FOR NEXT SESSION: Review/update HEP PRN. Continue with neck/shoulder mobility as able/tolerated, education on appropriate symptom monitoring/tolerance with activity. Consider beginning Pulley for AAROM in flexion and scaption.    Ashley Murrain PT, DPT 03/28/2023 1:13 PM

## 2023-04-01 ENCOUNTER — Ambulatory Visit: Payer: Self-pay

## 2023-04-01 DIAGNOSIS — M25512 Pain in left shoulder: Secondary | ICD-10-CM

## 2023-04-01 DIAGNOSIS — M6281 Muscle weakness (generalized): Secondary | ICD-10-CM

## 2023-04-01 DIAGNOSIS — M25612 Stiffness of left shoulder, not elsewhere classified: Secondary | ICD-10-CM

## 2023-04-01 NOTE — Therapy (Signed)
OUTPATIENT PHYSICAL THERAPY TREATMENT NOTE   Patient Name: [name] [name] MRN: [id number] DOB:31-Aug-1951, 72 y.o., female Today's Date: 04/01/2023  PCP: Grayce Sessions, NP  REFERRING PROVIDER: Tarry Kos, MD   END OF SESSION:   PT End of Session - 04/01/23 0946     Visit Number 4    PT Start Time 0935    PT Stop Time 1015    PT Time Calculation (min) 40 min    Activity Tolerance Patient tolerated treatment well;No increased pain    Behavior During Therapy [facility] for tasks assessed/performed              Past Medical History:  Diagnosis Date   Allergy    spring pollen   Arthritis    Astigmatism, 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   Atrial fibrillation    Cataract 01/28/2017   High cholesterol    History of kidney stones    Hypermetropia, 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   Hypertension    Osteoarthritis of ankle, right    and Subtalar Joint   Osteoporosis    Presbyopia of both eyes 01/28/2017   Pterygium eye, 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   Past Surgical History:  Procedure Laterality Date   ANKLE FUSION Right 12/24/2017   Tibiocalcaneal fusion   ANKLE FUSION Right 12/24/2017   Procedure: RIGHT TIBIOCALCANEAL FUSION;  Surgeon: Nadara Mustard, MD;  Location: [facility] OR;  Service: Orthopedics;  Laterality: Right;   CHOLECYSTECTOMY     Patient Active Problem List   Diagnosis Date Noted   Atrial fibrillation 10/01/2019   Atrial fibrillation with RVR 10/01/2019   Osteopenia of multiple sites 02/05/2019   Hyperlipidemia 02/05/2019   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/2017   Post-traumatic osteoarthritis, right ankle and foot    Seasonal allergic rhinitis due to pollen 05/12/2017   Allergy    Cataract 01/28/2017   Presbyopia of both eyes 01/28/2017   Pterygium eye, 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   Astigmatism, 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   Hypermetropia, 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   Acquired bilateral flat feet 09/07/2015   Osteoporosis 09/07/2015   Elevated troponin 01/26/2015   Generalized anxiety disorder 01/26/2015    Hypertension 07/28/2014   Gastritis 07/28/2014   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   OBESITY 05/10/2009   NEPHROLITHIASIS 06/15/2008   Dyslipidemia 03/16/2008   OSTEOARTHRITIS, LUMBAR SPINE 01/09/2008   BUNDLE BRANCH BLOCK, LEFT 12/11/2007   PTSD 10/01/2007   ARM PAIN, LEFT 10/01/2007    REFERRING DIAG:  W09.92XA (ICD-10-CM) - Traumatic closed displaced fracture of left shoulder with anterior dislocation, initial encounter   THERAPY DIAG:  Left shoulder pain, unspecified chronicity  Stiffness of left shoulder, not elsewhere classified  Muscle weakness (generalized)  Rationale for Evaluation and Treatment Rehabilitation  PERTINENT HISTORY: afib, HTN, osteoporosis; fall on 01/12/23  PRECAUTIONS: no precautions in chart review or per discussion w/ pt (>8 weeks out from fall); hx of falls  SUBJECTIVE:  SUBJECTIVE STATEMENT:   04/01/2023 Patient states that she had a little soreness following her last session, but she has been feeling much better in her shoulder. Sees MD April 15th, pt is accompanied by her daughter and in person interpreter.   PAIN:  Are you having pain: 04/01/2023 none while not moving it, unrated pain with movement Location/description: L shoulder, lateral/superior Best-worst over past week: 6/10  Per eval: - aggravating factors: mostly at night or when moving  - Easing factors: medication, change in positioning, rest   OBJECTIVE: (objective measures completed at initial evaluation unless otherwise dated)   DIAGNOSTIC FINDINGS:  02/08/23 L shoulder MRI:  IMPRESSION: 1. Complete tear of the supraspinatus tendon with 3.5 cm of retraction. 2. Severe tendinosis of the infraspinatus tendon with a small interstitial tear. 3. Mild tendinosis of the subscapularis tendon. 4. Intra-articular  portion of the long head of the biceps tendon is attenuated concerning for a complete tear. 5. Nondisplaced impaction fracture of the superolateral humeral head involving the greater tuberosity with subcortical marrow edema.   PATIENT SURVEYS:  FOTO: deferred on eval given time constraints/language, plan to administer as appropriate    COGNITION: Overall cognitive status: Within functional limits for tasks assessed, pleasant and conversationally appropriate, does endorse some mild memory deficits                                  SENSATION: No neuro complaints - light touch intact throughout B UE   POSTURE: Guarded L UE, mild fwd head and kyphosis    UPPER EXTREMITY ROM:   A/PROM Right eval Left Eval AROM Left 04/01/23  Shoulder flexion 165 deg  100 deg  112 deg  Shoulder abduction 158 deg 92 deg    Shoulder internal rotation (functional)  C7 NT   Shoulder external rotation (functional) T12 NT   Elbow flexion       Elbow extension       Wrist flexion       Wrist extension        (Blank rows = not tested) (Key: WFL = within functional limits not formally assessed, * = concordant pain, s = stiffness/stretching sensation, NT = not tested)  Comments: passive ROM approximately equivalent to AROM - limited assessment by muscle guarding   UPPER EXTREMITY MMT:   MMT Right eval Left eval  Shoulder flexion      Shoulder extension      Shoulder abduction      Shoulder extension      Shoulder internal rotation      Shoulder external rotation      Elbow flexion      Elbow extension      Grip strength      (Blank rows = not tested)  (Key: WFL = within functional limits not formally assessed, * = concordant pain, s = stiffness/stretching sensation, NT = not tested)  Comments: deferred on evaluation   PALPATION:  Notable tightness throughout L UT, LS, deltoid, and lat but pt denies overt tenderness. No bony tenderness or gross deformities on palpation              [facility] Adult PT  Treatment:  DATE: 04/01/2023  Therapeutic Exercise: Seated LS stretch to R, 2x30sec  Seated UT stretch to R 2x30sec  Seated Resisted Rows with RTB, 2 x 10 Standing swiss ball rollout fwd only, 3x10 cues for appropriate ROM and pacing UBE 3' fwd/3' backward, level 2 at self-selected pace at start of session  Patient requires verbal and visual cues for appropriate muscle activation and to address compensatory patterns    Neuromuscular Education  Patient education: initial injury as it relates to current symptoms and progress with Physical Therapy   Manual Therapy: Long Axis Distraction to alleviate symptoms PROM in flexion/scaption  Soft Tissue Mobilization along deltoid, UT and periscapular musculature along posterior aspect of shoulder    [facility] Adult PT Treatment:                                                DATE: 03/28/23  Therapeutic Exercise: LS stretch to R, 3x30sec verbal and visual cues for appropriate ROM  UT stretch to R 3x30sec verbal and visual cues for appropriate ROM  Scapular retraction 1x10 cues for reduced compensations at UT and elbow  Resisted Rows with RTB, 2 x 10  Standing swiss ball rollout fwd only x20 cues for appropriate ROM and pacing UBE 3' fwd/3' backward, level 1 at self-selected pace  Manual Therapy: Gentle Long Axis Distraction to alleviate impingement symptoms PROM in flexion/scaption  Soft Tissue Mobilization along deltoid, UT and periscapular musculature along posterior aspect of shoulder   TODAY'S TREATMENT:                                                                                                                                         [facility] Adult PT Treatment:                                                DATE: 03/25/23 Therapeutic Exercise: LS stretch to R, 3x30sec cues for appropriate ROM  UT stretch to R 3x30sec cues for appropriate ROM  Scapular retraction 2x10 cues for reduced compensations  at UT and elbow, 3 sec hold  Standing swiss ball rollout fwd only 2x10 cues, 3 sec hold HEP handout + education, significant time spent on relevant anatomy/physiology and ensuring appropriate performance of activity    PATIENT EDUCATION: Education details: rationale for interventions, HEP, importance of activity modification based on symptoms Person educated: Patient and daughter Education method: Explanation, Demonstration, Tactile cues, Verbal cues Education comprehension: verbalized understanding, returned demonstration, verbal cues required, tactile cues required, and needs further education     HOME EXERCISE PROGRAM: Access Code: [email or web address] URL: [email or web address] Date: 03/25/2023 Prepared by: Fransisco Hertz  Exercises -  Seated Shoulder Flexion Towel Slide at Table Top  - 1 x daily - 7 x weekly - 2 sets - 10 reps - Gentle Levator Scapulae Stretch  - 1 x daily - 7 x weekly - 1-3 sets - 1-3 reps - 30sec hold   ASSESSMENT:   CLINICAL IMPRESSION: 04/01/2023 Patient was able to tolerate increased resistance from level 1 to level 2 with upper extremity ergometer today. She continues to have difficulty with backwards revolutions, but denies increased symptoms. She continues to respond well to manual therapy for decreased L deltoid muscle tension and improved L shoulder AROM. Pt is demonstrating 12 degrees increase in L shoulder flexion AROM as compared to eval, at end of today's sessions. Educated patient regarding initial injury as it pertains to current symptoms and progression with Physical Therapy.   Eval - Patient is a pleasant 72 y.o. woman who was seen today for physical therapy evaluation and treatment for L shoulder pain s/p mechanical fall with anterior dislocation (reduced) and rotator cuff tear. Incident occurred in January 2024, pt >8 weeks out from initial fall and reduction, denies MD restrictions. Pt endorses pain mostly at night and transiently with movement,  today demonstrates active and passive limitations of L GH mobility compared to R, no overt TTP or bony deformities noted. Denies any pain on arrival but does endorse feeling improved after mobility during exam. No concern for red flags on today's exam, reportedly progressing well since initial incident. Unable to prescribe HEP or administer FOTO due to time constraints (late check in) but will plan to administer at next visit as able. No adverse events. Recommend skilled PT to address aforementioned deficits and maximize functional independence, pt/daughter in agreement with plan as described below. Pt departs today's session in no acute distress, all voiced questions/concerns addressed appropriately from PT perspective.     OBJECTIVE IMPAIRMENTS: decreased activity tolerance, decreased endurance, decreased mobility, decreased ROM, decreased strength, hypomobility, impaired UE functional use, postural dysfunction, and pain.    ACTIVITY LIMITATIONS: carrying, lifting, bathing, dressing, reach over head, and hygiene/grooming   PARTICIPATION LIMITATIONS: meal prep, cleaning, and laundry   PERSONAL FACTORS: Age and 3+ comorbidities: afib, HTN, osteoporosis  are also affecting patient's functional outcome.    REHAB POTENTIAL: Good   CLINICAL DECISION MAKING: Stable/uncomplicated   EVALUATION COMPLEXITY: Low     GOALS: Goals reviewed with patient? No given time constraints   SHORT TERM GOALS: Target date: 04/08/2023 Pt will demonstrate appropriate understanding and performance of initially prescribed HEP in order to facilitate improved independence with management of symptoms.  Baseline: HEP TBD Goal status: INITIAL    2. Pt will improve greater than or equal to MCID on FOTO in order to demonstrate improved perception of function due to symptoms.            Baseline: FOTO TBD             Goal status: INITIAL     LONG TERM GOALS: Target date: 05/06/2023   Pt will meet predicted score on FOTO in  order to demonstrate improved perception of function due to symptoms. Baseline: FOTO TBD Goal status: INITIAL   2.  Pt will demonstrate at least 140 degrees of active shoulder elevation in order to demonstrate improved tolerance to functional movement patterns such as reaching overhead and upper body dressing.  Baseline: see ROM chart above Goal status: INITIAL   3.  Pt will demonstrate at least 4/5 shoulder flexion MMT for improved symmetry of UE strength and  improved tolerance to functional movements.  Baseline: MMT deferred on eval given proximity to injury Goal status: INITIAL   4. Pt will report ability to perform upper body dressing with less than 2 point increase in pain on NPS in order to indicate improved tolerance to ADLs.            Baseline: 0-6/10 pain with ADLs            Goal status: INITIAL    5. Pt will demonstrate appropriate performance of final prescribed HEP in order to facilitate improved self-management of symptoms post-discharge.             Baseline: HEP TBD            Goal status: INITIAL     6. Pt will endorse at least 50% reduction in overall pain levels to facilitate improved tolerance to daily activities.             Baseline: 0-6/10 on NPS            Goal status: INITIAL    PLAN:   PT FREQUENCY: 2x/week   PT DURATION: 8 weeks   PLANNED INTERVENTIONS: Therapeutic exercises, Therapeutic activity, Neuromuscular re-education, Balance training, Gait training, Patient/Family education, Self Care, Joint mobilization, Aquatic Therapy, Dry Needling, Electrical stimulation, Spinal mobilization, Cryotherapy, Moist heat, Taping, Vasopneumatic device, Manual therapy, and Re-evaluation   PLAN FOR NEXT SESSION: Review/update HEP PRN. Continue with neck/shoulder mobility as able/tolerated, education on appropriate symptom monitoring/tolerance with activity. Consider beginning Pulley for AAROM in flexion and scaption.   Mauri Reading, PT, DPT   04/01/2023 10:36 AM

## 2023-04-03 NOTE — Therapy (Signed)
OUTPATIENT PHYSICAL THERAPY TREATMENT NOTE   Patient Name: Isabell JarvisMaria [name] Vazquez Garcia MRN: [id number] DOB:02/16/1951, 72 y.o., female Today's Date: 04/04/2023  PCP: Grayce SessionsEdwards, Michelle P, NP  REFERRING PROVIDER: Tarry KosXu, Naiping M, MD   END OF SESSION:   PT End of Session - 04/04/23 0938     Visit Number 5    Number of Visits 17    Date for PT Re-Evaluation 05/06/23    Authorization Type none    PT Start Time [id number]   late check in   PT Stop Time 1012    PT Time Calculation (min) 33 min    Activity Tolerance Patient tolerated treatment well;No increased pain    Behavior During [facility] for tasks assessed/performed               Past Medical History:  Diagnosis Date   Allergy    spring pollen   Arthritis    Astigmatism, 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   Atrial fibrillation    Cataract 01/28/2017   High cholesterol    History of kidney stones    Hypermetropia, 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   Hypertension    Osteoarthritis of ankle, right    and Subtalar Joint   Osteoporosis    Presbyopia of both eyes 01/28/2017   Pterygium eye, 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   Past Surgical History:  Procedure Laterality Date   ANKLE FUSION Right 12/24/2017   Tibiocalcaneal fusion   ANKLE FUSION Right 12/24/2017   Procedure: RIGHT TIBIOCALCANEAL FUSION;  Surgeon: Nadara Mustarduda, Marcus V, MD;  [facility];  Service: Orthopedics;  Laterality: Right;   CHOLECYSTECTOMY     Patient Active Problem List   Diagnosis Date Noted   Atrial fibrillation 10/01/2019   Atrial fibrillation with RVR 10/01/2019   Osteopenia of multiple sites 02/05/2019   Hyperlipidemia 02/05/2019   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/2017   Post-traumatic osteoarthritis, right ankle and foot    Seasonal allergic rhinitis due to pollen 05/12/2017   Allergy    Cataract 01/28/2017   Presbyopia of both eyes 01/28/2017   Pterygium eye, 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   Astigmatism, 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   Hypermetropia, 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   Acquired bilateral flat feet  09/07/2015   Osteoporosis 09/07/2015   Elevated troponin 01/26/2015   Generalized anxiety disorder 01/26/2015   Hypertension 07/28/2014   Gastritis 07/28/2014   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   OBESITY 05/10/2009   NEPHROLITHIASIS 06/15/2008   Dyslipidemia 03/16/2008   OSTEOARTHRITIS, LUMBAR SPINE 01/09/2008   BUNDLE BRANCH BLOCK, LEFT 12/11/2007   PTSD 10/01/2007   ARM PAIN, LEFT 10/01/2007    REFERRING DIAG:  F62S42.92XA (ICD-10-CM) - Traumatic closed displaced fracture of left shoulder with anterior dislocation, initial encounter   THERAPY DIAG:  Left shoulder pain, unspecified chronicity  Stiffness of left shoulder, not elsewhere classified  Muscle weakness (generalized)  Rationale for Evaluation and Treatment Rehabilitation  PERTINENT HISTORY: afib, HTN, osteoporosis; fall on 01/12/23  PRECAUTIONS: no precautions in chart review or per discussion w/ pt (>8 weeks out from fall); hx of falls  SUBJECTIVE:  SUBJECTIVE STATEMENT:   04/04/2023 Pt states she continues to feel better with therapy, no issues after last session. Continues to have difficulty with raising arm and reaching backwards. Appreciate assistance of in person interpreter.    PAIN:  Are you having pain: 04/04/2023  none while not moving it, unrated pain with movement Location/description: L shoulder, lateral/superior Best-worst over past week: 6/10  Per eval: - aggravating factors: mostly at night or when moving  - Easing factors: medication, change in positioning, rest   OBJECTIVE: (objective measures completed at initial evaluation unless otherwise dated)   DIAGNOSTIC FINDINGS:  02/08/23 L shoulder MRI:  IMPRESSION: 1. Complete tear of the supraspinatus tendon with 3.5 cm of retraction. 2. Severe tendinosis of the infraspinatus  tendon with a small interstitial tear. 3. Mild tendinosis of the subscapularis tendon. 4. Intra-articular portion of the long head of the biceps tendon is attenuated concerning for a complete tear. 5. Nondisplaced impaction fracture of the superolateral humeral head involving the greater tuberosity with subcortical marrow edema.   PATIENT SURVEYS:  FOTO: deferred on eval given time constraints/language, plan to administer as appropriate    COGNITION: Overall cognitive status: Within functional limits for tasks assessed, pleasant and conversationally appropriate, does endorse some mild memory deficits                                  SENSATION: No neuro complaints - light touch intact throughout B UE   POSTURE: Guarded L UE, mild fwd head and kyphosis    UPPER EXTREMITY ROM:   A/PROM Right eval Left Eval AROM Left 04/01/23  Shoulder flexion 165 deg  100 deg  112 deg  Shoulder abduction 158 deg 92 deg    Shoulder internal rotation (functional)  C7 NT   Shoulder external rotation (functional) T12 NT   Elbow flexion       Elbow extension       Wrist flexion       Wrist extension        (Blank rows = not tested) (Key: WFL = within functional limits not formally assessed, * = concordant pain, s = stiffness/stretching sensation, NT = not tested)  Comments: passive ROM approximately equivalent to AROM - limited assessment by muscle guarding   UPPER EXTREMITY MMT:   MMT Right eval Left eval  Shoulder flexion      Shoulder extension      Shoulder abduction      Shoulder extension      Shoulder internal rotation      Shoulder external rotation      Elbow flexion      Elbow extension      Grip strength      (Blank rows = not tested)  (Key: WFL = within functional limits not formally assessed, * = concordant pain, s = stiffness/stretching sensation, NT = not tested)  Comments: deferred on evaluation   PALPATION:  Notable tightness throughout L UT, LS, deltoid, and lat but pt  denies overt tenderness. No bony tenderness or gross deformities on palpation       [facility] Adult PT Treatment:                                                DATE: 04/04/23 Therapeutic Exercise: Standing swiss ball rollout 2x10  each for flexion, x10 scaption cues for comfortable ROM  Pulleys AAROM x12 BIL for flexion cues for comfortable ROM  Significant time required for cues/education on appropriate movement/performance with above           [facility] Adult PT Treatment:                                                DATE: 04/01/2023  Therapeutic Exercise: Seated LS stretch to R, 2x30sec  Seated UT stretch to R 2x30sec  Seated Resisted Rows with RTB, 2 x 10 Standing swiss ball rollout fwd only, 3x10 cues for appropriate ROM and pacing UBE 3' fwd/3' backward, level 2 at self-selected pace at start of session  Patient requires verbal and visual cues for appropriate muscle activation and to address compensatory patterns    Neuromuscular Education  Patient education: initial injury as it relates to current symptoms and progress with Physical Therapy   Manual Therapy: Long Axis Distraction to alleviate symptoms PROM in flexion/scaption  Soft Tissue Mobilization along deltoid, UT and periscapular musculature along posterior aspect of shoulder    [facility] Adult PT Treatment:                                                DATE: 03/28/23  Therapeutic Exercise: LS stretch to R, 3x30sec verbal and visual cues for appropriate ROM  UT stretch to R 3x30sec verbal and visual cues for appropriate ROM  Scapular retraction 1x10 cues for reduced compensations at UT and elbow  Resisted Rows with RTB, 2 x 10  Standing swiss ball rollout fwd only x20 cues for appropriate ROM and pacing UBE 3' fwd/3' backward, level 1 at self-selected pace  Manual Therapy: Gentle Long Axis Distraction to alleviate impingement symptoms PROM in flexion/scaption  Soft Tissue Mobilization along deltoid, UT and periscapular  musculature along posterior aspect of shoulder   TODAY'S TREATMENT:                                                                                                                                         [facility] Adult PT Treatment:                                                DATE: 03/25/23 Therapeutic Exercise: LS stretch to R, 3x30sec cues for appropriate ROM  UT stretch to R 3x30sec cues for appropriate ROM  Scapular retraction 2x10 cues for reduced compensations at UT and elbow, 3 sec hold  Standing swiss ball  rollout fwd only 2x10 cues, 3 sec hold HEP handout + education, significant time spent on relevant anatomy/physiology and ensuring appropriate performance of activity    PATIENT EDUCATION: Education details: rationale for interventions, HEP, importance of activity modification based on symptoms Person educated: Patient Education method: Explanation, Demonstration, Tactile cues, Verbal cues Education comprehension: verbalized understanding, returned demonstration, verbal cues required, tactile cues required, and needs further education     HOME EXERCISE PROGRAM: Access Code: [email or web address] URL: [email or web address] Date: 03/25/2023 Prepared by: Fransisco Hertz  Exercises - Seated Shoulder Flexion Towel Slide at Table Top  - 1 x daily - 7 x weekly - 2 sets - 10 reps - Gentle Levator Scapulae Stretch  - 1 x daily - 7 x weekly - 1-3 sets - 1-3 reps - 30sec hold   ASSESSMENT:   CLINICAL IMPRESSION: 04/04/2023 Pt arrives without resting pain continues to endorse gradual improvement with therapy. Session shortened due to late arrival, requires significant time with each activity for education/cueing. Endorses some discomfort during scaption AAROM which is likely attributable to increased compensations but resolves with cessation of activity. Addition of pulleys with good tolerance, increased ROM with repetition. No adverse events, denies any pain on departure. Pt departs today's  session in no acute distress, all voiced questions/concerns addressed appropriately from PT perspective.     Eval - Patient is a pleasant 72 y.o. woman who was seen today for physical therapy evaluation and treatment for L shoulder pain s/p mechanical fall with anterior dislocation (reduced) and rotator cuff tear. Incident occurred in January 2024, pt >8 weeks out from initial fall and reduction, denies MD restrictions. Pt endorses pain mostly at night and transiently with movement, today demonstrates active and passive limitations of L GH mobility compared to R, no overt TTP or bony deformities noted. Denies any pain on arrival but does endorse feeling improved after mobility during exam. No concern for red flags on today's exam, reportedly progressing well since initial incident. Unable to prescribe HEP or administer FOTO due to time constraints (late check in) but will plan to administer at next visit as able. No adverse events. Recommend skilled PT to address aforementioned deficits and maximize functional independence, pt/daughter in agreement with plan as described below. Pt departs today's session in no acute distress, all voiced questions/concerns addressed appropriately from PT perspective.     OBJECTIVE IMPAIRMENTS: decreased activity tolerance, decreased endurance, decreased mobility, decreased ROM, decreased strength, hypomobility, impaired UE functional use, postural dysfunction, and pain.    ACTIVITY LIMITATIONS: carrying, lifting, bathing, dressing, reach over head, and hygiene/grooming   PARTICIPATION LIMITATIONS: meal prep, cleaning, and laundry   PERSONAL FACTORS: Age and 3+ comorbidities: afib, HTN, osteoporosis  are also affecting patient's functional outcome.    REHAB POTENTIAL: Good   CLINICAL DECISION MAKING: Stable/uncomplicated   EVALUATION COMPLEXITY: Low     GOALS: Goals reviewed with patient? No given time constraints   SHORT TERM GOALS: Target date: 04/08/2023 Pt  will demonstrate appropriate understanding and performance of initially prescribed HEP in order to facilitate improved independence with management of symptoms.  Baseline: HEP TBD Goal status: INITIAL    2. Pt will improve greater than or equal to MCID on FOTO in order to demonstrate improved perception of function due to symptoms.            Baseline: FOTO TBD             Goal status: INITIAL     LONG TERM GOALS: Target date: 05/06/2023  Pt will meet predicted score on FOTO in order to demonstrate improved perception of function due to symptoms. Baseline: FOTO TBD Goal status: INITIAL   2.  Pt will demonstrate at least 140 degrees of active shoulder elevation in order to demonstrate improved tolerance to functional movement patterns such as reaching overhead and upper body dressing.  Baseline: see ROM chart above Goal status: INITIAL   3.  Pt will demonstrate at least 4/5 shoulder flexion MMT for improved symmetry of UE strength and improved tolerance to functional movements.  Baseline: MMT deferred on eval given proximity to injury Goal status: INITIAL   4. Pt will report ability to perform upper body dressing with less than 2 point increase in pain on NPS in order to indicate improved tolerance to ADLs.            Baseline: 0-6/10 pain with ADLs            Goal status: INITIAL    5. Pt will demonstrate appropriate performance of final prescribed HEP in order to facilitate improved self-management of symptoms post-discharge.             Baseline: HEP TBD            Goal status: INITIAL     6. Pt will endorse at least 50% reduction in overall pain levels to facilitate improved tolerance to daily activities.             Baseline: 0-6/10 on NPS            Goal status: INITIAL    PLAN:   PT FREQUENCY: 2x/week   PT DURATION: 8 weeks   PLANNED INTERVENTIONS: Therapeutic exercises, Therapeutic activity, Neuromuscular re-education, Balance training, Gait training, Patient/Family  education, Self Care, Joint mobilization, Aquatic Therapy, Dry Needling, Electrical stimulation, Spinal mobilization, Cryotherapy, Moist heat, Taping, Vasopneumatic device, Manual therapy, and Re-evaluation   PLAN FOR NEXT SESSION: continue to progress GH mobility as able/appropriate  Ashley Murrain PT, DPT 04/04/2023 12:23 PM

## 2023-04-04 ENCOUNTER — Encounter: Payer: Self-pay | Admitting: Physical Therapy

## 2023-04-04 ENCOUNTER — Ambulatory Visit: Payer: Self-pay | Admitting: Physical Therapy

## 2023-04-04 DIAGNOSIS — M6281 Muscle weakness (generalized): Secondary | ICD-10-CM

## 2023-04-04 DIAGNOSIS — M25612 Stiffness of left shoulder, not elsewhere classified: Secondary | ICD-10-CM

## 2023-04-04 DIAGNOSIS — M25512 Pain in left shoulder: Secondary | ICD-10-CM

## 2023-04-07 NOTE — Therapy (Signed)
OUTPATIENT PHYSICAL THERAPY TREATMENT NOTE   Patient Name: [name] [name] MRN: [id number] DOB:08/14/1951, 72 y.o., female Today's Date: 04/08/2023  PCP: Grayce Sessions, NP  REFERRING PROVIDER: Tarry Kos, MD   END OF SESSION:   PT End of Session - 04/08/23 0932     Visit Number 6    Number of Visits 17    Date for PT Re-Evaluation 05/06/23    Authorization Type none    PT Start Time 0933    PT Stop Time 1014    PT Time Calculation (min) 41 min    Activity Tolerance Patient tolerated treatment well;No increased pain    Behavior During Therapy [facility] for tasks assessed/performed                Past Medical History:  Diagnosis Date   Allergy    spring pollen   Arthritis    Astigmatism, 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   Atrial fibrillation    Cataract 01/28/2017   High cholesterol    History of kidney stones    Hypermetropia, 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   Hypertension    Osteoarthritis of ankle, right    and Subtalar Joint   Osteoporosis    Presbyopia of both eyes 01/28/2017   Pterygium eye, 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   Past Surgical History:  Procedure Laterality Date   ANKLE FUSION Right 12/24/2017   Tibiocalcaneal fusion   ANKLE FUSION Right 12/24/2017   Procedure: RIGHT TIBIOCALCANEAL FUSION;  Surgeon: Nadara Mustard, MD;  Location: [facility] OR;  Service: Orthopedics;  Laterality: Right;   CHOLECYSTECTOMY     Patient Active Problem List   Diagnosis Date Noted   Atrial fibrillation 10/01/2019   Atrial fibrillation with RVR 10/01/2019   Osteopenia of multiple sites 02/05/2019   Hyperlipidemia 02/05/2019   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/2017   Post-traumatic osteoarthritis, right ankle and foot    Seasonal allergic rhinitis due to pollen 05/12/2017   Allergy    Cataract 01/28/2017   Presbyopia of both eyes 01/28/2017   Pterygium eye, 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   Astigmatism, 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   Hypermetropia, 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   Acquired bilateral flat feet 09/07/2015    Osteoporosis 09/07/2015   Elevated troponin 01/26/2015   Generalized anxiety disorder 01/26/2015   Hypertension 07/28/2014   Gastritis 07/28/2014   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   OBESITY 05/10/2009   NEPHROLITHIASIS 06/15/2008   Dyslipidemia 03/16/2008   OSTEOARTHRITIS, LUMBAR SPINE 01/09/2008   BUNDLE BRANCH BLOCK, LEFT 12/11/2007   PTSD 10/01/2007   ARM PAIN, LEFT 10/01/2007    REFERRING DIAG:  I96.92XA (ICD-10-CM) - Traumatic closed displaced fracture of left shoulder with anterior dislocation, initial encounter   THERAPY DIAG:  Left shoulder pain, unspecified chronicity  Stiffness of left shoulder, not elsewhere classified  Muscle weakness (generalized)  Rationale for Evaluation and Treatment Rehabilitation  PERTINENT HISTORY: afib, HTN, osteoporosis; fall on 01/12/23  PRECAUTIONS: no precautions in chart review or per discussion w/ pt (>8 weeks out from fall); hx of falls  SUBJECTIVE:  SUBJECTIVE STATEMENT:   04/08/2023 Pt states she is having a little bit of pain today but felt better after last session. Appreciate assistance of in person interpreter, pt accompanied by daughter. Continues to have minimal pain aside from reaching backwards or overhead   PAIN:  Are you having pain: 04/08/2023  none while not moving it, unrated pain with movement Location/description: L shoulder, lateral/superior Best-worst over past week: 6/10  Per eval: - aggravating factors: mostly at night or when moving  - Easing factors: medication, change in positioning, rest   OBJECTIVE: (objective measures completed at initial evaluation unless otherwise dated)   DIAGNOSTIC FINDINGS:  02/08/23 L shoulder MRI:  IMPRESSION: 1. Complete tear of the supraspinatus tendon with 3.5 cm of retraction. 2. Severe tendinosis  of the infraspinatus tendon with a small interstitial tear. 3. Mild tendinosis of the subscapularis tendon. 4. Intra-articular portion of the long head of the biceps tendon is attenuated concerning for a complete tear. 5. Nondisplaced impaction fracture of the superolateral humeral head involving the greater tuberosity with subcortical marrow edema.   PATIENT SURVEYS:  FOTO: deferred on eval given time constraints/language, plan to administer as appropriate    COGNITION: Overall cognitive status: Within functional limits for tasks assessed, pleasant and conversationally appropriate, does endorse some mild memory deficits                                  SENSATION: No neuro complaints - light touch intact throughout B UE   POSTURE: Guarded L UE, mild fwd head and kyphosis    UPPER EXTREMITY ROM:   A/PROM Right eval Left Eval AROM Left 04/01/23 Left 04/08/23 AROM  Shoulder flexion 165 deg  100 deg  112 deg 120 deg painless   Shoulder abduction 158 deg 92 deg     Shoulder internal rotation (functional)  C7 NT    Shoulder external rotation (functional) T12 NT    Elbow flexion        Elbow extension        Wrist flexion        Wrist extension         (Blank rows = not tested) (Key: WFL = within functional limits not formally assessed, * = concordant pain, s = stiffness/stretching sensation, NT = not tested)  Comments: passive ROM approximately equivalent to AROM - limited assessment by muscle guarding   UPPER EXTREMITY MMT:   MMT Right eval Left eval  Shoulder flexion      Shoulder extension      Shoulder abduction      Shoulder extension      Shoulder internal rotation      Shoulder external rotation      Elbow flexion      Elbow extension      Grip strength      (Blank rows = not tested)  (Key: WFL = within functional limits not formally assessed, * = concordant pain, s = stiffness/stretching sensation, NT = not tested)  Comments: deferred on evaluation   PALPATION:   Notable tightness throughout L UT, LS, deltoid, and lat but pt denies overt tenderness. No bony tenderness or gross deformities on palpation      TODAY'S TREATMENT:    [facility] Adult PT Treatment:  DATE: 04/08/23 Therapeutic Exercise: Swiss ball flexion rollout x15 cues for comfortable ROM  Swiss ball scaption x10 standing at table, cues for comfortable ROM, minimizing WB RTB row cues for posture and comfortable ROM, 3x10  Elbow flexion 1# x10 cues for reduced compensations at shoulder  Pulley flexion AAROM 2x12 cues for comfortable ROM  HEP update + handout   [facility] Adult PT Treatment:                                                DATE: 04/04/23 Therapeutic Exercise: Standing swiss ball rollout 2x10 each for flexion, x10 scaption cues for comfortable ROM  Pulleys AAROM x12 BIL for flexion cues for comfortable ROM  Significant time required for cues/education on appropriate movement/performance with above           [facility] Adult PT Treatment:                                                DATE: 04/01/2023 Therapeutic Exercise: Seated LS stretch to R, 2x30sec  Seated UT stretch to R 2x30sec  Seated Resisted Rows with RTB, 2 x 10 Standing swiss ball rollout fwd only, 3x10 cues for appropriate ROM and pacing UBE 3' fwd/3' backward, level 2 at self-selected pace at start of session  Patient requires verbal and visual cues for appropriate muscle activation and to address compensatory patterns    Neuromuscular Education  Patient education: initial injury as it relates to current symptoms and progress with Physical Therapy   Manual Therapy: Long Axis Distraction to alleviate symptoms PROM in flexion/scaption  Soft Tissue Mobilization along deltoid, UT and periscapular musculature along posterior aspect of shoulder    [facility] Adult PT Treatment:                                                DATE: 03/28/23  Therapeutic Exercise: LS stretch to R,  3x30sec verbal and visual cues for appropriate ROM  UT stretch to R 3x30sec verbal and visual cues for appropriate ROM  Scapular retraction 1x10 cues for reduced compensations at UT and elbow  Resisted Rows with RTB, 2 x 10  Standing swiss ball rollout fwd only x20 cues for appropriate ROM and pacing UBE 3' fwd/3' backward, level 1 at self-selected pace  Manual Therapy: Gentle Long Axis Distraction to alleviate impingement symptoms PROM in flexion/scaption  Soft Tissue Mobilization along deltoid, UT and periscapular musculature along posterior aspect of shoulder  [facility] Adult PT Treatment:                                                DATE: 03/25/23 Therapeutic Exercise: LS stretch to R, 3x30sec cues for appropriate ROM  UT stretch to R 3x30sec cues for appropriate ROM  Scapular retraction 2x10 cues for reduced compensations at UT and elbow, 3 sec hold  Standing swiss ball rollout fwd only 2x10 cues, 3 sec hold HEP handout + education, significant time spent on relevant anatomy/physiology and ensuring appropriate performance of activity    PATIENT EDUCATION: Education details: rationale for interventions, HEP, importance of activity modification based on symptoms Person educated: Patient Education method: Explanation, Demonstration, Tactile cues, Verbal cues Education comprehension: verbalized understanding, returned demonstration, verbal cues required, tactile cues required, and needs further education     HOME EXERCISE PROGRAM: Access Code: [email or web address] URL: [email or web address] Date: 04/08/2023 Prepared by: Fransisco Hertz  Exercises - Seated Shoulder Flexion Towel Slide at Table Top  - 1 x daily - 7 x weekly - 2 sets - 10 reps - Gentle Levator Scapulae Stretch  - 1 x daily - 7 x weekly - 1-3 sets - 1-3 reps - 30sec hold - Seated Gentle  Upper Trapezius Stretch  - 1 x daily - 7 x weekly - 1-3 sets - 1-3 reps - 30sec hold - Seated Scapular Retraction  - 1 x daily - 7 x weekly - 3 sets - 10 reps   ASSESSMENT:   CLINICAL IMPRESSION: 04/08/2023 Pt arrives w/ report of minimal pain, states she felt good after last session, tends to have minimal pain during day, mostly at night and with quick movements. Today focusing on continuing progression for [facility] mobility within tolerated range, continues to require frequent cues to mitigate compensations and maintain appropriate ROM. Improved tolerance to scaption on this date, and pt is able to achieve 120deg of forward GH flexion AAROM painlessly although compensations are noted at scapula. Departs with report of improved symptoms, no adverse events. Follows up with MD later this week, will aim to tailor interventions accordingly pending tissue healing. Pt departs today's session in no acute distress, all voiced questions/concerns addressed appropriately from PT perspective.       Eval - Patient is a pleasant 72 y.o. woman who was seen today for physical therapy evaluation and treatment for L shoulder pain s/p mechanical fall with anterior dislocation (reduced) and rotator cuff tear. Incident occurred in January 2024, pt >8 weeks out from initial fall and reduction, denies MD restrictions. Pt endorses pain mostly at night and transiently with movement, today demonstrates active and passive limitations of L GH mobility compared to R, no overt TTP or bony deformities noted. Denies any pain on arrival but does endorse feeling improved after mobility during exam. No concern for red flags on today's exam, reportedly progressing well since initial incident. Unable to prescribe HEP or administer FOTO due to time constraints (late check in) but will plan to administer at next visit as able. No adverse events. Recommend skilled PT to address aforementioned deficits and maximize functional independence, pt/daughter  in agreement with plan as described below. Pt departs today's session in no acute distress, all voiced questions/concerns addressed appropriately from PT perspective.     OBJECTIVE IMPAIRMENTS: decreased activity tolerance, decreased endurance, decreased mobility, decreased ROM, decreased strength, hypomobility, impaired UE functional use, postural  dysfunction, and pain.    ACTIVITY LIMITATIONS: carrying, lifting, bathing, dressing, reach over head, and hygiene/grooming   PARTICIPATION LIMITATIONS: meal prep, cleaning, and laundry   PERSONAL FACTORS: Age and 3+ comorbidities: afib, HTN, osteoporosis  are also affecting patient's functional outcome.    REHAB POTENTIAL: Good   CLINICAL DECISION MAKING: Stable/uncomplicated   EVALUATION COMPLEXITY: Low     GOALS: Goals reviewed with patient? No given time constraints   SHORT TERM GOALS: Target date: 04/08/2023 Pt will demonstrate appropriate understanding and performance of initially prescribed HEP in order to facilitate improved independence with management of symptoms.  Baseline: HEP TBD Goal status: INITIAL    2. Pt will improve greater than or equal to MCID on FOTO in order to demonstrate improved perception of function due to symptoms.            Baseline: FOTO TBD             Goal status: INITIAL     LONG TERM GOALS: Target date: 05/06/2023   Pt will meet predicted score on FOTO in order to demonstrate improved perception of function due to symptoms. Baseline: FOTO TBD Goal status: INITIAL   2.  Pt will demonstrate at least 140 degrees of active shoulder elevation in order to demonstrate improved tolerance to functional movement patterns such as reaching overhead and upper body dressing.  Baseline: see ROM chart above Goal status: INITIAL   3.  Pt will demonstrate at least 4/5 shoulder flexion MMT for improved symmetry of UE strength and improved tolerance to functional movements.  Baseline: MMT deferred on eval given  proximity to injury Goal status: INITIAL   4. Pt will report ability to perform upper body dressing with less than 2 point increase in pain on NPS in order to indicate improved tolerance to ADLs.            Baseline: 0-6/10 pain with ADLs            Goal status: INITIAL    5. Pt will demonstrate appropriate performance of final prescribed HEP in order to facilitate improved self-management of symptoms post-discharge.             Baseline: HEP TBD            Goal status: INITIAL     6. Pt will endorse at least 50% reduction in overall pain levels to facilitate improved tolerance to daily activities.             Baseline: 0-6/10 on NPS            Goal status: INITIAL    PLAN:   PT FREQUENCY: 2x/week   PT DURATION: 8 weeks   PLANNED INTERVENTIONS: Therapeutic exercises, Therapeutic activity, Neuromuscular re-education, Balance training, Gait training, Patient/Family education, Self Care, Joint mobilization, Aquatic Therapy, Dry Needling, Electrical stimulation, Spinal mobilization, Cryotherapy, Moist heat, Taping, Vasopneumatic device, Manual therapy, and Re-evaluation   PLAN FOR NEXT SESSION: continue to progress GH mobility as able/appropriate    Ashley Murrain PT, DPT 04/08/2023 12:37 PM

## 2023-04-08 ENCOUNTER — Ambulatory Visit: Payer: Self-pay | Admitting: Physical Therapy

## 2023-04-08 ENCOUNTER — Encounter: Payer: Self-pay | Admitting: Physical Therapy

## 2023-04-08 DIAGNOSIS — M25512 Pain in left shoulder: Secondary | ICD-10-CM

## 2023-04-08 DIAGNOSIS — M6281 Muscle weakness (generalized): Secondary | ICD-10-CM

## 2023-04-08 DIAGNOSIS — M25612 Stiffness of left shoulder, not elsewhere classified: Secondary | ICD-10-CM

## 2023-04-10 NOTE — Therapy (Signed)
OUTPATIENT PHYSICAL THERAPY TREATMENT NOTE   Patient Name: [name] [name] MRN: [id number] DOB:1951-01-19, 72 y.o., female Today's Date: 04/11/2023  PCP: Grayce Sessions, NP  REFERRING PROVIDER: Tarry Kos, MD   END OF SESSION:   PT End of Session - 04/11/23 1319     Visit Number 7    Number of Visits 17    Date for PT Re-Evaluation 05/06/23    Authorization Type none    PT Start Time 1319    PT Stop Time 1405    PT Time Calculation (min) 46 min    Activity Tolerance Patient tolerated treatment well;No increased pain    Behavior During Therapy [facility] for tasks assessed/performed                 Past Medical History:  Diagnosis Date   Allergy    spring pollen   Arthritis    Astigmatism, 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   Atrial fibrillation    Cataract 01/28/2017   High cholesterol    History of kidney stones    Hypermetropia, 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   Hypertension    Osteoarthritis of ankle, right    and Subtalar Joint   Osteoporosis    Presbyopia of both eyes 01/28/2017   Pterygium eye, 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   Past Surgical History:  Procedure Laterality Date   ANKLE FUSION Right 12/24/2017   Tibiocalcaneal fusion   ANKLE FUSION Right 12/24/2017   Procedure: RIGHT TIBIOCALCANEAL FUSION;  Surgeon: Nadara Mustard, MD;  Location: [facility] OR;  Service: Orthopedics;  Laterality: Right;   CHOLECYSTECTOMY     Patient Active Problem List   Diagnosis Date Noted   Atrial fibrillation 10/01/2019   Atrial fibrillation with RVR 10/01/2019   Osteopenia of multiple sites 02/05/2019   Hyperlipidemia 02/05/2019   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/2017   Post-traumatic osteoarthritis, right ankle and foot    Seasonal allergic rhinitis due to pollen 05/12/2017   Allergy    Cataract 01/28/2017   Presbyopia of both eyes 01/28/2017   Pterygium eye, 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   Astigmatism, 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   Hypermetropia, 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   Acquired bilateral flat feet 09/07/2015    Osteoporosis 09/07/2015   Elevated troponin 01/26/2015   Generalized anxiety disorder 01/26/2015   Hypertension 07/28/2014   Gastritis 07/28/2014   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   OBESITY 05/10/2009   NEPHROLITHIASIS 06/15/2008   Dyslipidemia 03/16/2008   OSTEOARTHRITIS, LUMBAR SPINE 01/09/2008   BUNDLE BRANCH BLOCK, LEFT 12/11/2007   PTSD 10/01/2007   ARM PAIN, LEFT 10/01/2007    REFERRING DIAG:  W09.92XA (ICD-10-CM) - Traumatic closed displaced fracture of left shoulder with anterior dislocation, initial encounter   THERAPY DIAG:  Left shoulder pain, unspecified chronicity  Stiffness of left shoulder, not elsewhere classified  Muscle weakness (generalized)  Rationale for Evaluation and Treatment Rehabilitation  PERTINENT HISTORY: afib, HTN, osteoporosis; fall on 01/12/23  PRECAUTIONS: no precautions in chart review or per discussion w/ pt (>8 weeks out from fall); hx of falls  SUBJECTIVE:  SUBJECTIVE STATEMENT:   04/11/2023 Pt states she wasn't able to see her MD today, wasn't sure about the time of the appt. States she will call this afternoon to try and reschedule. Continues to feel improvement consistently, no longer having neck pain or UE referral, states she just has occasional pain in deltoid region. Appreciate assistance of in person interpreter throughout.     PAIN:  Are you having pain: 04/11/2023  none while not moving it, unrated pain with movement Location/description: L shoulder, lateral/superior Best-worst over past week: 6/10  Per eval: - aggravating factors: mostly at night or when moving  - Easing factors: medication, change in positioning, rest   OBJECTIVE: (objective measures completed at initial evaluation unless otherwise dated)   DIAGNOSTIC FINDINGS:  02/08/23 L shoulder  MRI:  IMPRESSION: 1. Complete tear of the supraspinatus tendon with 3.5 cm of retraction. 2. Severe tendinosis of the infraspinatus tendon with a small interstitial tear. 3. Mild tendinosis of the subscapularis tendon. 4. Intra-articular portion of the long head of the biceps tendon is attenuated concerning for a complete tear. 5. Nondisplaced impaction fracture of the superolateral humeral head involving the greater tuberosity with subcortical marrow edema.   PATIENT SURVEYS:  FOTO: deferred on eval given time constraints/language, plan to administer as appropriate    COGNITION: Overall cognitive status: Within functional limits for tasks assessed, pleasant and conversationally appropriate, does endorse some mild memory deficits                                  SENSATION: No neuro complaints - light touch intact throughout B UE   POSTURE: Guarded L UE, mild fwd head and kyphosis    UPPER EXTREMITY ROM:   A/PROM Right eval Left Eval AROM Left 04/01/23 Left 04/08/23 AROM  Shoulder flexion 165 deg  100 deg  112 deg 120 deg painless   Shoulder abduction 158 deg 92 deg     Shoulder internal rotation (functional)  C7 NT    Shoulder external rotation (functional) T12 NT    Elbow flexion        Elbow extension        Wrist flexion        Wrist extension         (Blank rows = not tested) (Key: WFL = within functional limits not formally assessed, * = concordant pain, s = stiffness/stretching sensation, NT = not tested)  Comments: passive ROM approximately equivalent to AROM - limited assessment by muscle guarding   UPPER EXTREMITY MMT:   MMT Right eval Left eval  Shoulder flexion      Shoulder extension      Shoulder abduction      Shoulder extension      Shoulder internal rotation      Shoulder external rotation      Elbow flexion      Elbow extension      Grip strength      (Blank rows = not tested)  (Key: WFL = within functional limits not formally assessed, * =  concordant pain, s = stiffness/stretching sensation, NT = not tested)  Comments: deferred on evaluation   PALPATION:  Notable tightness throughout L UT, LS, deltoid, and lat but pt denies overt tenderness. No bony tenderness or gross deformities on palpation      TODAY'S TREATMENT:    [facility] Adult PT Treatment:  DATE: 04/11/23 Therapeutic Exercise: Pulley flexion AAROM x15 cues for pacing Pulley scaption AAROM x15 cues for comfortable ROM RTB row x15, GTB 2x12 cues for reduced elbow compensations and appropriate scapular mechanics Swiss ball flexion x15 cues for scapular mechanics  Triceps push down RTB 2x10 cues for reduced UT compensations UT stretch 2x30sec  LS stretch 2x30sec  Pendulums x20 CW/CCW cues for pacing and relaxing musculature    [facility] Adult PT Treatment:                                                DATE: 04/08/23 Therapeutic Exercise: Swiss ball flexion rollout x15 cues for comfortable ROM  Swiss ball scaption x10 standing at table, cues for comfortable ROM, minimizing WB RTB row cues for posture and comfortable ROM, 3x10  Elbow flexion 1# x10 cues for reduced compensations at shoulder  Pulley flexion AAROM 2x12 cues for comfortable ROM  HEP update + handout     PATIENT EDUCATION: Education details: rationale for interventions, HEP, importance of activity modification based on symptoms Person educated: Patient Education method: Explanation, Demonstration, Tactile cues, Verbal cues Education comprehension: verbalized understanding, returned demonstration, verbal cues required, tactile cues required, and needs further education     HOME EXERCISE PROGRAM: Access Code: [email or web address] URL: [email or web address] Date: 04/08/2023 Prepared by: Fransisco Hertz  Exercises - Seated Shoulder Flexion Towel Slide at Table Top  - 1 x daily - 7 x weekly - 2 sets - 10 reps - Gentle Levator Scapulae Stretch  - 1 x daily - 7 x  weekly - 1-3 sets - 1-3 reps - 30sec hold - Seated Gentle Upper Trapezius Stretch  - 1 x daily - 7 x weekly - 1-3 sets - 1-3 reps - 30sec hold - Seated Scapular Retraction  - 1 x daily - 7 x weekly - 3 sets - 10 reps   ASSESSMENT:   CLINICAL IMPRESSION: 04/11/2023 Pt arrives w/ reports of continued improvement in pain and activity at home. Today focusing on increasing tissue extensibility as tolerated and progression of periscapular/elbow resistance training which pt tolerates well without pain. Cues as above. Pt continues to endorse reduced pain levels and improved function at home, but did encourage following back up with referring provider to ensure appropriate tissue healing and progress from medical standpoint, she states she will call this afternoon to try and reschedule. Departs without pain or adverse events. Recommend continuing along current POC in order to address relevant deficits and improve functional tolerance. Pt departs today's session in no acute distress, all voiced questions/concerns addressed appropriately from PT perspective.    Eval - Patient is a pleasant 72 y.o. woman who was seen today for physical therapy evaluation and treatment for L shoulder pain s/p mechanical fall with anterior dislocation (reduced) and rotator cuff tear. Incident occurred in January 2024, pt >8 weeks out from initial fall and reduction, denies MD restrictions. Pt endorses pain mostly at night and transiently with movement, today demonstrates active and passive limitations of L GH mobility compared to R, no overt TTP or bony deformities noted. Denies any pain on arrival but does endorse feeling improved after mobility during exam. No concern for red flags on today's exam, reportedly progressing well since initial incident. Unable to prescribe HEP or administer FOTO due to time constraints (late check in) but will plan to administer at next visit as able.  No adverse events. Recommend skilled PT to address  aforementioned deficits and maximize functional independence, pt/daughter in agreement with plan as described below. Pt departs today's session in no acute distress, all voiced questions/concerns addressed appropriately from PT perspective.     OBJECTIVE IMPAIRMENTS: decreased activity tolerance, decreased endurance, decreased mobility, decreased ROM, decreased strength, hypomobility, impaired UE functional use, postural dysfunction, and pain.    ACTIVITY LIMITATIONS: carrying, lifting, bathing, dressing, reach over head, and hygiene/grooming   PARTICIPATION LIMITATIONS: meal prep, cleaning, and laundry   PERSONAL FACTORS: Age and 3+ comorbidities: afib, HTN, osteoporosis  are also affecting patient's functional outcome.    REHAB POTENTIAL: Good   CLINICAL DECISION MAKING: Stable/uncomplicated   EVALUATION COMPLEXITY: Low     GOALS: Goals reviewed with patient? No given time constraints   SHORT TERM GOALS: Target date: 04/08/2023 Pt will demonstrate appropriate understanding and performance of initially prescribed HEP in order to facilitate improved independence with management of symptoms.  Baseline: HEP TBD 04/11/23: Pt endorses good compliance with HEP Goal status: MET   2. Pt will improve greater than or equal to MCID on FOTO in order to demonstrate improved perception of function due to symptoms.            Baseline: FOTO TBD   04/11/23: FOTO/ODI not administered             Goal status: NOT MET   LONG TERM GOALS: Target date: 05/06/2023   Pt will meet predicted score on FOTO in order to demonstrate improved perception of function due to symptoms. Baseline: FOTO TBD Goal status: INITIAL   2.  Pt will demonstrate at least 140 degrees of active shoulder elevation in order to demonstrate improved tolerance to functional movement patterns such as reaching overhead and upper body dressing.  Baseline: see ROM chart above Goal status: INITIAL   3.  Pt will demonstrate at least 4/5  shoulder flexion MMT for improved symmetry of UE strength and improved tolerance to functional movements.  Baseline: MMT deferred on eval given proximity to injury Goal status: INITIAL   4. Pt will report ability to perform upper body dressing with less than 2 point increase in pain on NPS in order to indicate improved tolerance to ADLs.            Baseline: 0-6/10 pain with ADLs            Goal status: INITIAL    5. Pt will demonstrate appropriate performance of final prescribed HEP in order to facilitate improved self-management of symptoms post-discharge.             Baseline: HEP TBD            Goal status: INITIAL     6. Pt will endorse at least 50% reduction in overall pain levels to facilitate improved tolerance to daily activities.             Baseline: 0-6/10 on NPS            Goal status: INITIAL    PLAN:   PT FREQUENCY: 2x/week   PT DURATION: 8 weeks   PLANNED INTERVENTIONS: Therapeutic exercises, Therapeutic activity, Neuromuscular re-education, Balance training, Gait training, Patient/Family education, Self Care, Joint mobilization, Aquatic Therapy, Dry Needling, Electrical stimulation, Spinal mobilization, Cryotherapy, Moist heat, Taping, Vasopneumatic device, Manual therapy, and Re-evaluation   PLAN FOR NEXT SESSION: continue to progress GH mobility as able/appropriate     Ashley Murrain PT, DPT 04/11/2023 3:06 PM

## 2023-04-11 ENCOUNTER — Ambulatory Visit: Payer: Self-pay | Admitting: Physical Therapy

## 2023-04-11 ENCOUNTER — Encounter: Payer: Self-pay | Admitting: Physical Therapy

## 2023-04-11 ENCOUNTER — Ambulatory Visit: Payer: Self-pay | Admitting: Orthopaedic Surgery

## 2023-04-11 DIAGNOSIS — M25512 Pain in left shoulder: Secondary | ICD-10-CM

## 2023-04-11 DIAGNOSIS — M6281 Muscle weakness (generalized): Secondary | ICD-10-CM

## 2023-04-11 DIAGNOSIS — M25612 Stiffness of left shoulder, not elsewhere classified: Secondary | ICD-10-CM

## 2023-04-14 NOTE — Therapy (Signed)
OUTPATIENT PHYSICAL THERAPY TREATMENT NOTE   Patient Name: [name] [name] MRN: [id number] DOB:[birth year], 72 y.o., female Today's Date: 04/15/2023  PCP: Grayce Sessions, NP  REFERRING PROVIDER: Tarry Kos, MD   END OF SESSION:   PT End of Session - 04/15/23 0935     Visit Number 8    Number of Visits 17    Date for PT Re-Evaluation 05/06/23    Authorization Type none    PT Start Time [phone]   late check in   PT Stop Time 1013    PT Time Calculation (min) 36 min    Activity Tolerance Patient tolerated treatment well;No increased pain    Behavior During Therapy [facility] for tasks assessed/performed                  Past Medical History:  Diagnosis Date   Allergy    spring pollen   Arthritis    Astigmatism, 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   Atrial fibrillation    Cataract 01/28/2017   High cholesterol    History of kidney stones    Hypermetropia, 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   Hypertension    Osteoarthritis of ankle, right    and Subtalar Joint   Osteoporosis    Presbyopia of both eyes 01/28/2017   Pterygium eye, 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   Past Surgical History:  Procedure Laterality Date   ANKLE FUSION Right 12/24/2017   Tibiocalcaneal fusion   ANKLE FUSION Right 12/24/2017   Procedure: RIGHT TIBIOCALCANEAL FUSION;  Surgeon: Nadara Mustard, MD;  Location: [facility] OR;  Service: Orthopedics;  Laterality: Right;   CHOLECYSTECTOMY     Patient Active Problem List   Diagnosis Date Noted   Atrial fibrillation 10/01/2019   Atrial fibrillation with RVR 10/01/2019   Osteopenia of multiple sites 02/05/2019   Hyperlipidemia 02/05/2019   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/2017   Post-traumatic osteoarthritis, right ankle and foot    Seasonal allergic rhinitis due to pollen 05/12/2017   Allergy    Cataract 01/28/2017   Presbyopia of both eyes 01/28/2017   Pterygium eye, 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   Astigmatism, 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   Hypermetropia, 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   Acquired bilateral flat feet  09/07/2015   Osteoporosis 09/07/2015   Elevated troponin 01/26/2015   Generalized anxiety disorder 01/26/2015   Hypertension 07/28/2014   Gastritis 07/28/2014   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   OBESITY 05/10/2009   NEPHROLITHIASIS 06/15/2008   Dyslipidemia 03/16/2008   OSTEOARTHRITIS, LUMBAR SPINE 01/09/2008   BUNDLE BRANCH BLOCK, LEFT 12/11/2007   PTSD 10/01/2007   ARM PAIN, LEFT 10/01/2007    REFERRING DIAG:  F62.92XA (ICD-10-CM) - Traumatic closed displaced fracture of left shoulder with anterior dislocation, initial encounter   THERAPY DIAG:  Left shoulder pain, unspecified chronicity  Stiffness of left shoulder, not elsewhere classified  Muscle weakness (generalized)  Rationale for Evaluation and Treatment Rehabilitation  PERTINENT HISTORY: afib, HTN, osteoporosis; fall on 01/12/23  PRECAUTIONS: no precautions in chart review or per discussion w/ pt (>8 weeks out from fall); hx of falls  SUBJECTIVE:  SUBJECTIVE STATEMENT:   04/15/2023 Pt states she felt good after last session, but did try using her arm more over the weekend and had a bit more pain, denies any pain at present but is tired. States she will be seeing MD May 13th. Appreciate assistance of in person interpreter throughout.     PAIN:  Are you having pain: 04/15/2023  none Location/description: L shoulder, lateral/superior Best-worst over past week: 6/10  Per eval: - aggravating factors: mostly at night or when moving  - Easing factors: medication, change in positioning, rest   OBJECTIVE: (objective measures completed at initial evaluation unless otherwise dated)   DIAGNOSTIC FINDINGS:  02/08/23 L shoulder MRI:  IMPRESSION: 1. Complete tear of the supraspinatus tendon with 3.5 cm of retraction. 2. Severe tendinosis of the  infraspinatus tendon with a small interstitial tear. 3. Mild tendinosis of the subscapularis tendon. 4. Intra-articular portion of the long head of the biceps tendon is attenuated concerning for a complete tear. 5. Nondisplaced impaction fracture of the superolateral humeral head involving the greater tuberosity with subcortical marrow edema.   PATIENT SURVEYS:  FOTO: deferred on eval given time constraints/language, plan to administer as appropriate    COGNITION: Overall cognitive status: Within functional limits for tasks assessed, pleasant and conversationally appropriate, does endorse some mild memory deficits                                  SENSATION: No neuro complaints - light touch intact throughout B UE   POSTURE: Guarded L UE, mild fwd head and kyphosis    UPPER EXTREMITY ROM:   A/PROM Right eval Left Eval AROM Left 04/01/23 Left 04/08/23 AROM Left AROM 04/15/23  Shoulder flexion 165 deg  100 deg  112 deg 120 deg painless    Shoulder abduction 158 deg 92 deg      Shoulder internal rotation (functional)  C7 NT     Shoulder external rotation (functional) T12 NT     Elbow flexion         Elbow extension         Wrist flexion         Wrist extension          (Blank rows = not tested) (Key: WFL = within functional limits not formally assessed, * = concordant pain, s = stiffness/stretching sensation, NT = not tested)  Comments: passive ROM approximately equivalent to AROM - limited assessment by muscle guarding   UPPER EXTREMITY MMT:   MMT Right eval Left eval  Shoulder flexion      Shoulder extension      Shoulder abduction      Shoulder extension      Shoulder internal rotation      Shoulder external rotation      Elbow flexion      Elbow extension      Grip strength      (Blank rows = not tested)  (Key: WFL = within functional limits not formally assessed, * = concordant pain, s = stiffness/stretching sensation, NT = not tested)  Comments: deferred on  evaluation   PALPATION:  Notable tightness throughout L UT, LS, deltoid, and lat but pt denies overt tenderness. No bony tenderness or gross deformities on palpation      TODAY'S TREATMENT:    [facility] PT Treatment:  DATE: 04/15/23 Therapeutic Exercise: Pulley flexion AAROM x20 cues for comfortable ROM  Pulley scaption AAROM x20 cues for comfortable ROM  GTB row 2x15 cues for elbow positioning Tricep pushdown 2x10 LUE only cues for form  Swiss ball flexion at table, standing x20 cues for comfortable ROM HEP update, provided w/ RTB for rows and significant time spent with education re: appropriate performance, pain free movement. Family education w/ daughter re: this topic as well   [facility] Adult PT Treatment:                                                DATE: 04/11/23 Therapeutic Exercise: Pulley flexion AAROM x15 cues for pacing Pulley scaption AAROM x15 cues for comfortable ROM RTB row x15, GTB 2x12 cues for reduced elbow compensations and appropriate scapular mechanics Swiss ball flexion x15 cues for scapular mechanics  Triceps push down RTB 2x10 cues for reduced UT compensations UT stretch 2x30sec  LS stretch 2x30sec  Pendulums x20 CW/CCW cues for pacing and relaxing musculature    [facility] Adult PT Treatment:                                                DATE: 04/08/23 Therapeutic Exercise: Swiss ball flexion rollout x15 cues for comfortable ROM  Swiss ball scaption x10 standing at table, cues for comfortable ROM, minimizing WB RTB row cues for posture and comfortable ROM, 3x10  Elbow flexion 1# x10 cues for reduced compensations at shoulder  Pulley flexion AAROM 2x12 cues for comfortable ROM  HEP update + handout     PATIENT EDUCATION: Education details: rationale for interventions, HEP, importance of activity modification based on symptoms Person educated: Patient Education method: Explanation, Demonstration, Tactile cues,  Verbal cues Education comprehension: verbalized understanding, returned demonstration, verbal cues required, tactile cues required, and needs further education     HOME EXERCISE PROGRAM: Access Code: [email or web address] URL: [email or web address] Date: 04/15/2023 Prepared by: Fransisco Hertz  Exercises - Seated Shoulder Flexion Towel Slide at Table Top  - 1 x daily - 7 x weekly - 2 sets - 10 reps - Gentle Levator Scapulae Stretch  - 1 x daily - 7 x weekly - 1-3 sets - 1-3 reps - 30sec hold - Seated Gentle Upper Trapezius Stretch  - 1 x daily - 7 x weekly - 1-3 sets - 1-3 reps - 30sec hold - Standing Shoulder Row with Anchored Resistance  - 1 x daily - 7 x weekly - 2 sets - 10 reps   ASSESSMENT:   CLINICAL IMPRESSION: 04/15/2023 Pt arrives w/o pain although she endorses some increased pain over weekend which she attributes to using her arm more, states she felt good after last session. Today focusing on gradual progression of pain free resistance training, given consistent tolerance within sessions provided with RTB for rows at home, significant time spent w/ education on appropriate performance and monitoring symptoms. Pt able to achieve 118deg active GH flexion at end of session, limited by stiffness and transient pain. No adverse events, pt departs with report of reduced stiffness and no pain. Pt departs today's session in no acute distress, all voiced questions/concerns addressed appropriately from PT perspective.     Eval - Patient is a pleasant 72  y.o. woman who was seen today for physical therapy evaluation and treatment for L shoulder pain s/p mechanical fall with anterior dislocation (reduced) and rotator cuff tear. Incident occurred in January 2024, pt >8 weeks out from initial fall and reduction, denies MD restrictions. Pt endorses pain mostly at night and transiently with movement, today demonstrates active and passive limitations of L GH mobility compared to R, no overt TTP or bony  deformities noted. Denies any pain on arrival but does endorse feeling improved after mobility during exam. No concern for red flags on today's exam, reportedly progressing well since initial incident. Unable to prescribe HEP or administer FOTO due to time constraints (late check in) but will plan to administer at next visit as able. No adverse events. Recommend skilled PT to address aforementioned deficits and maximize functional independence, pt/daughter in agreement with plan as described below. Pt departs today's session in no acute distress, all voiced questions/concerns addressed appropriately from PT perspective.     OBJECTIVE IMPAIRMENTS: decreased activity tolerance, decreased endurance, decreased mobility, decreased ROM, decreased strength, hypomobility, impaired UE functional use, postural dysfunction, and pain.    ACTIVITY LIMITATIONS: carrying, lifting, bathing, dressing, reach over head, and hygiene/grooming   PARTICIPATION LIMITATIONS: meal prep, cleaning, and laundry   PERSONAL FACTORS: Age and 3+ comorbidities: afib, HTN, osteoporosis  are also affecting patient's functional outcome.    REHAB POTENTIAL: Good   CLINICAL DECISION MAKING: Stable/uncomplicated   EVALUATION COMPLEXITY: Low     GOALS: Goals reviewed with patient? No given time constraints   SHORT TERM GOALS: Target date: 04/08/2023 Pt will demonstrate appropriate understanding and performance of initially prescribed HEP in order to facilitate improved independence with management of symptoms.  Baseline: HEP TBD 04/11/23: Pt endorses good compliance with HEP Goal status: MET   2. Pt will improve greater than or equal to MCID on FOTO in order to demonstrate improved perception of function due to symptoms.            Baseline: FOTO TBD   04/11/23: FOTO/ODI not administered             Goal status: NOT MET   LONG TERM GOALS: Target date: 05/06/2023   Pt will meet predicted score on FOTO in order to demonstrate  improved perception of function due to symptoms. Baseline: FOTO TBD Goal status: INITIAL   2.  Pt will demonstrate at least 140 degrees of active shoulder elevation in order to demonstrate improved tolerance to functional movement patterns such as reaching overhead and upper body dressing.  Baseline: see ROM chart above Goal status: INITIAL   3.  Pt will demonstrate at least 4/5 shoulder flexion MMT for improved symmetry of UE strength and improved tolerance to functional movements.  Baseline: MMT deferred on eval given proximity to injury Goal status: INITIAL   4. Pt will report ability to perform upper body dressing with less than 2 point increase in pain on NPS in order to indicate improved tolerance to ADLs.            Baseline: 0-6/10 pain with ADLs            Goal status: INITIAL    5. Pt will demonstrate appropriate performance of final prescribed HEP in order to facilitate improved self-management of symptoms post-discharge.             Baseline: HEP TBD            Goal status: INITIAL     6. Pt will endorse  at least 50% reduction in overall pain levels to facilitate improved tolerance to daily activities.             Baseline: 0-6/10 on NPS            Goal status: INITIAL    PLAN:   PT FREQUENCY: 2x/week   PT DURATION: 8 weeks   PLANNED INTERVENTIONS: Therapeutic exercises, Therapeutic activity, Neuromuscular re-education, Balance training, Gait training, Patient/Family education, Self Care, Joint mobilization, Aquatic Therapy, Dry Needling, Electrical stimulation, Spinal mobilization, Cryotherapy, Moist heat, Taping, Vasopneumatic device, Manual therapy, and Re-evaluation   PLAN FOR NEXT SESSION: continue to progress GH mobility as able/appropriate, periscapular resistance training as tolerated   Ashley Murrain PT, DPT 04/15/2023 10:21 AM

## 2023-04-15 ENCOUNTER — Ambulatory Visit: Payer: Self-pay | Admitting: Physical Therapy

## 2023-04-15 ENCOUNTER — Encounter: Payer: Self-pay | Admitting: Physical Therapy

## 2023-04-15 DIAGNOSIS — M6281 Muscle weakness (generalized): Secondary | ICD-10-CM

## 2023-04-15 DIAGNOSIS — M25512 Pain in left shoulder: Secondary | ICD-10-CM

## 2023-04-15 DIAGNOSIS — M25612 Stiffness of left shoulder, not elsewhere classified: Secondary | ICD-10-CM

## 2023-04-17 ENCOUNTER — Other Ambulatory Visit: Payer: Self-pay

## 2023-04-18 ENCOUNTER — Other Ambulatory Visit: Payer: Self-pay

## 2023-04-18 ENCOUNTER — Ambulatory Visit: Payer: Self-pay | Admitting: Physical Therapy

## 2023-04-22 ENCOUNTER — Encounter: Payer: Self-pay | Admitting: Physical Therapy

## 2023-04-22 ENCOUNTER — Ambulatory Visit: Payer: Self-pay | Admitting: Physical Therapy

## 2023-04-22 DIAGNOSIS — M25612 Stiffness of left shoulder, not elsewhere classified: Secondary | ICD-10-CM

## 2023-04-22 DIAGNOSIS — M6281 Muscle weakness (generalized): Secondary | ICD-10-CM

## 2023-04-22 DIAGNOSIS — M25512 Pain in left shoulder: Secondary | ICD-10-CM

## 2023-04-22 NOTE — Therapy (Signed)
OUTPATIENT PHYSICAL THERAPY TREATMENT NOTE   Patient Name: [name] [name] MRN: [id number] DOB:1951/10/02, 72 y.o., female Today's Date: 04/22/2023  PCP: Grayce Sessions, NP  REFERRING PROVIDER: Tarry Kos, MD   END OF SESSION:   PT End of Session - 04/22/23 1626     Visit Number 9    Number of Visits 17    Date for PT Re-Evaluation 05/06/23    Authorization Type none    PT Start Time 1628    PT Stop Time 1713    PT Time Calculation (min) 45 min    Activity Tolerance Patient tolerated treatment well;No increased pain    Behavior During Therapy [facility] for tasks assessed/performed                   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   Hypertension    Osteoarthritis of ankle, right    and Subtalar Joint   Osteoporosis    Presbyopia of both eyes 01/28/2017   Pterygium eye, 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   Past Surgical History:  Procedure Laterality Date   ANKLE FUSION Right 12/24/2017   Tibiocalcaneal fusion   ANKLE FUSION Right 12/24/2017   Procedure: RIGHT TIBIOCALCANEAL FUSION;  Surgeon: Nadara Mustard, MD;  Location: [facility] OR;  Service: Orthopedics;  Laterality: Right;   CHOLECYSTECTOMY     Patient Active Problem List   Diagnosis Date Noted   Atrial fibrillation (HCC) 10/01/2019   Atrial fibrillation with RVR (HCC) 10/01/2019   Osteopenia of multiple sites 02/05/2019   Hyperlipidemia 02/05/2019   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/2017   Post-traumatic osteoarthritis, right ankle and foot    Seasonal allergic rhinitis due to pollen 05/12/2017   Allergy    Cataract 01/28/2017   Presbyopia of both eyes 01/28/2017   Pterygium eye, 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   Astigmatism, 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   Hypermetropia, 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   Acquired bilateral flat  feet 09/07/2015   Osteoporosis 09/07/2015   Elevated troponin 01/26/2015   Generalized anxiety disorder 01/26/2015   Hypertension 07/28/2014   Gastritis 07/28/2014   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   OBESITY 05/10/2009   NEPHROLITHIASIS 06/15/2008   Dyslipidemia 03/16/2008   OSTEOARTHRITIS, LUMBAR SPINE 01/09/2008   BUNDLE BRANCH BLOCK, LEFT 12/11/2007   PTSD 10/01/2007   ARM PAIN, LEFT 10/01/2007    REFERRING DIAG:  W09.92XA (ICD-10-CM) - Traumatic closed displaced fracture of left shoulder with anterior dislocation, initial encounter   THERAPY DIAG:  Left shoulder pain, unspecified chronicity  Stiffness of left shoulder, not elsewhere classified  Muscle weakness (generalized)  Rationale for Evaluation and Treatment Rehabilitation  PERTINENT HISTORY: afib, HTN, osteoporosis; fall on 01/12/23  PRECAUTIONS: no precautions in chart review or per discussion w/ pt (>8 weeks out from fall); hx of falls  SUBJECTIVE:  SUBJECTIVE STATEMENT:   04/22/2023 Pt states she has been doing well with banded exercises at home, has been exercising a lot. States she felt good after last session. Also notes pendulums and stretches have been helpful. Still has trouble lifting arm overhead. Accompanied by daughter and in person interpreter   PAIN:  Are you having pain: 04/22/2023  none at rest  Location/description: L shoulder, lateral/superior Best-worst over past week: 6/10  Per eval: - aggravating factors: mostly at night or when moving  - Easing factors: medication, change in positioning, rest   OBJECTIVE: (objective measures completed at initial evaluation unless otherwise dated)   DIAGNOSTIC FINDINGS:  02/08/23 L shoulder MRI:  IMPRESSION: 1. Complete tear of the supraspinatus tendon with 3.5 cm  of retraction. 2. Severe tendinosis of the infraspinatus tendon with a small interstitial tear. 3. Mild tendinosis of the subscapularis tendon. 4. Intra-articular portion of the long head of the biceps tendon is attenuated concerning for a complete tear. 5. Nondisplaced impaction fracture of the superolateral humeral head involving the greater tuberosity with subcortical marrow edema.   PATIENT SURVEYS:  FOTO: deferred on eval given time constraints/language, plan to administer as appropriate    COGNITION: Overall cognitive status: Within functional limits for tasks assessed, pleasant and conversationally appropriate, does endorse some mild memory deficits                                  SENSATION: No neuro complaints - light touch intact throughout B UE   POSTURE: Guarded L UE, mild fwd head and kyphosis    UPPER EXTREMITY ROM:   A/PROM Right eval Left Eval AROM Left 04/01/23 Left 04/08/23 AROM Left A/ROM 04/22/23  Shoulder flexion 165 deg  100 deg  112 deg 120 deg painless  115 deg pre MT, 120 deg post MT / 134 deg painless  Shoulder abduction 158 deg 92 deg      Shoulder internal rotation (functional)  C7 NT     Shoulder external rotation (functional) T12 NT     Elbow flexion         Elbow extension         Wrist flexion         Wrist extension          (Blank rows = not tested) (Key: WFL = within functional limits not formally assessed, * = concordant pain, s = stiffness/stretching sensation, NT = not tested)  Comments: passive ROM approximately equivalent to AROM - limited assessment by muscle guarding   UPPER EXTREMITY MMT:   MMT Right eval Left eval  Shoulder flexion      Shoulder extension      Shoulder abduction      Shoulder extension      Shoulder internal rotation      Shoulder external rotation      Elbow flexion      Elbow extension      Grip strength      (Blank rows = not tested)  (Key: WFL = within functional limits not formally assessed, * =  concordant pain, s = stiffness/stretching sensation, NT = not tested)  Comments: deferred on evaluation   PALPATION:  Notable tightness throughout L UT, LS, deltoid, and lat but pt denies overt tenderness. No bony tenderness or gross deformities on palpation      TODAY'S TREATMENT:    [facility] Adult PT Treatment:  DATE: 04/22/23 Therapeutic Exercise: Pulleys flexion AAROM x20 Pulleys scaption AAROM x20  GTB row 2x15 cues for reduced UT compensations  RTB shoulder extensions 2x10 cues for posture  RTB tricep push down 2x12 cues for form and posture  Shoulder flexion walkbacks at counter 2x10 cues for comfortable ROM and pacing Verbal HEP review    Manual Therapy: Seated; STM L latissimus dorsi, middle trap, rhomboid, LS, UT   [facility] Adult PT Treatment:                                                DATE: 04/15/23 Therapeutic Exercise: Pulley flexion AAROM x20 cues for comfortable ROM  Pulley scaption AAROM x20 cues for comfortable ROM  GTB row 2x15 cues for elbow positioning Tricep pushdown 2x10 LUE only cues for form  Swiss ball flexion at table, standing x20 cues for comfortable ROM HEP update, provided w/ RTB for rows and significant time spent with education re: appropriate performance, pain free movement. Family education w/ daughter re: this topic as well   [facility] Adult PT Treatment:                                                DATE: 04/11/23 Therapeutic Exercise: Pulley flexion AAROM x15 cues for pacing Pulley scaption AAROM x15 cues for comfortable ROM RTB row x15, GTB 2x12 cues for reduced elbow compensations and appropriate scapular mechanics Swiss ball flexion x15 cues for scapular mechanics  Triceps push down RTB 2x10 cues for reduced UT compensations UT stretch 2x30sec  LS stretch 2x30sec  Pendulums x20 CW/CCW cues for pacing and relaxing musculature     PATIENT EDUCATION: Education details: rationale for interventions, HEP,  importance of activity modification based on symptoms Person educated: Patient Education method: Explanation, Demonstration, Tactile cues, Verbal cues Education comprehension: verbalized understanding, returned demonstration, verbal cues required, tactile cues required, and needs further education     HOME EXERCISE PROGRAM: Access Code: [email or web address] URL: [email or web address] Date: 04/15/2023 Prepared by: Fransisco Hertz  Exercises - Seated Shoulder Flexion Towel Slide at Table Top  - 1 x daily - 7 x weekly - 2 sets - 10 reps - Gentle Levator Scapulae Stretch  - 1 x daily - 7 x weekly - 1-3 sets - 1-3 reps - 30sec hold - Seated Gentle Upper Trapezius Stretch  - 1 x daily - 7 x weekly - 1-3 sets - 1-3 reps - 30sec hold - Standing Shoulder Row with Anchored Resistance  - 1 x daily - 7 x weekly - 2 sets - 10 reps   ASSESSMENT:   CLINICAL IMPRESSION: 04/22/2023 Pt arrives w/o pain, states exercises continue to do well at home. Today focusing on continued progression of periscapular resistance exercises and GH mobility. Pt denies any increase in resting pain, some mild initial discomfort with pulley exercises that improves with repetition. Measurements as above - pt describes some tightness in L lat with overhead reaching and demos 5 deg improvement in flexion after manual. No adverse events, departs without pain. Pt departs today's session in no acute distress, all voiced questions/concerns addressed appropriately from PT perspective.     Eval - Patient is a pleasant 72 y.o. woman who was seen today for physical therapy evaluation and  treatment for L shoulder pain s/p mechanical fall with anterior dislocation (reduced) and rotator cuff tear. Incident occurred in January 2024, pt >8 weeks out from initial fall and reduction, denies MD restrictions. Pt endorses pain mostly at night and transiently with movement, today demonstrates active and passive limitations of L GH mobility compared to R,  no overt TTP or bony deformities noted. Denies any pain on arrival but does endorse feeling improved after mobility during exam. No concern for red flags on today's exam, reportedly progressing well since initial incident. Unable to prescribe HEP or administer FOTO due to time constraints (late check in) but will plan to administer at next visit as able. No adverse events. Recommend skilled PT to address aforementioned deficits and maximize functional independence, pt/daughter in agreement with plan as described below. Pt departs today's session in no acute distress, all voiced questions/concerns addressed appropriately from PT perspective.     OBJECTIVE IMPAIRMENTS: decreased activity tolerance, decreased endurance, decreased mobility, decreased ROM, decreased strength, hypomobility, impaired UE functional use, postural dysfunction, and pain.    ACTIVITY LIMITATIONS: carrying, lifting, bathing, dressing, reach over head, and hygiene/grooming   PARTICIPATION LIMITATIONS: meal prep, cleaning, and laundry   PERSONAL FACTORS: Age and 3+ comorbidities: afib, HTN, osteoporosis  are also affecting patient's functional outcome.    REHAB POTENTIAL: Good   CLINICAL DECISION MAKING: Stable/uncomplicated   EVALUATION COMPLEXITY: Low     GOALS: Goals reviewed with patient? No given time constraints   SHORT TERM GOALS: Target date: 04/08/2023 Pt will demonstrate appropriate understanding and performance of initially prescribed HEP in order to facilitate improved independence with management of symptoms.  Baseline: HEP TBD 04/11/23: Pt endorses good compliance with HEP Goal status: MET   2. Pt will improve greater than or equal to MCID on FOTO in order to demonstrate improved perception of function due to symptoms.            Baseline: FOTO TBD   04/11/23: FOTO/ODI not administered             Goal status: NOT MET   LONG TERM GOALS: Target date: 05/06/2023   Pt will meet predicted score on FOTO in  order to demonstrate improved perception of function due to symptoms. Baseline: FOTO TBD Goal status: INITIAL   2.  Pt will demonstrate at least 140 degrees of active shoulder elevation in order to demonstrate improved tolerance to functional movement patterns such as reaching overhead and upper body dressing.  Baseline: see ROM chart above Goal status: INITIAL   3.  Pt will demonstrate at least 4/5 shoulder flexion MMT for improved symmetry of UE strength and improved tolerance to functional movements.  Baseline: MMT deferred on eval given proximity to injury Goal status: INITIAL   4. Pt will report ability to perform upper body dressing with less than 2 point increase in pain on NPS in order to indicate improved tolerance to ADLs.            Baseline: 0-6/10 pain with ADLs            Goal status: INITIAL    5. Pt will demonstrate appropriate performance of final prescribed HEP in order to facilitate improved self-management of symptoms post-discharge.             Baseline: HEP TBD            Goal status: INITIAL     6. Pt will endorse at least 50% reduction in overall pain levels to facilitate improved  tolerance to daily activities.             Baseline: 0-6/10 on NPS            Goal status: INITIAL    PLAN:   PT FREQUENCY: 2x/week   PT DURATION: 8 weeks   PLANNED INTERVENTIONS: Therapeutic exercises, Therapeutic activity, Neuromuscular re-education, Balance training, Gait training, Patient/Family education, Self Care, Joint mobilization, Aquatic Therapy, Dry Needling, Electrical stimulation, Spinal mobilization, Cryotherapy, Moist heat, Taping, Vasopneumatic device, Manual therapy, and Re-evaluation   PLAN FOR NEXT SESSION: continue to progress GH mobility as able/appropriate, periscapular resistance training as tolerated    Ashley Murrain PT, DPT 04/22/2023 5:23 PM

## 2023-04-24 NOTE — Therapy (Signed)
OUTPATIENT PHYSICAL THERAPY PROGRESS NOTE + RECERTIFICATION   Patient Name: [name] [name] MRN: [id number] DOB:03-21-1951, 72 y.o., female Today's Date: 04/25/2023   Progress Note Reporting Period 03/11/2023 to 04/25/2023  PCP: Grayce Sessions, NP  REFERRING PROVIDER: Tarry Kos, MD   END OF SESSION:   PT End of Session - 04/25/23 1358     Visit Number 10    Number of Visits 17    Date for PT Re-Evaluation 05/23/23    Authorization Type none    PT Start Time 1400    PT Stop Time 1442    PT Time Calculation (min) 42 min    Activity Tolerance Patient tolerated treatment well;No increased pain    Behavior During Therapy [facility] for tasks assessed/performed              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   Hypertension    Osteoarthritis of ankle, right    and Subtalar Joint   Osteoporosis    Presbyopia of both eyes 01/28/2017   Pterygium eye, 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   Past Surgical History:  Procedure Laterality Date   ANKLE FUSION Right 12/24/2017   Tibiocalcaneal fusion   ANKLE FUSION Right 12/24/2017   Procedure: RIGHT TIBIOCALCANEAL FUSION;  Surgeon: Nadara Mustard, MD;  Location: [facility] OR;  Service: Orthopedics;  Laterality: Right;   CHOLECYSTECTOMY     Patient Active Problem List   Diagnosis Date Noted   Atrial fibrillation (HCC) 10/01/2019   Atrial fibrillation with RVR (HCC) 10/01/2019   Osteopenia of multiple sites 02/05/2019   Hyperlipidemia 02/05/2019   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/2017   Post-traumatic osteoarthritis, right ankle and foot    Seasonal allergic rhinitis due to pollen 05/12/2017   Allergy    Cataract 01/28/2017   Presbyopia of both eyes 01/28/2017   Pterygium eye, 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   Astigmatism, 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    Hypermetropia, 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   Acquired bilateral flat feet 09/07/2015   Osteoporosis 09/07/2015   Elevated troponin 01/26/2015   Generalized anxiety disorder 01/26/2015   Hypertension 07/28/2014   Gastritis 07/28/2014   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   OBESITY 05/10/2009   NEPHROLITHIASIS 06/15/2008   Dyslipidemia 03/16/2008   OSTEOARTHRITIS, LUMBAR SPINE 01/09/2008   BUNDLE BRANCH BLOCK, LEFT 12/11/2007   PTSD 10/01/2007   ARM PAIN, LEFT 10/01/2007    REFERRING DIAG:  W09.92XA (ICD-10-CM) - Traumatic closed displaced fracture of left shoulder with anterior dislocation, initial encounter   THERAPY DIAG:  Left shoulder pain, unspecified chronicity  Stiffness of left shoulder, not elsewhere classified  Muscle weakness (generalized)  Rationale for Evaluation and Treatment Rehabilitation  PERTINENT HISTORY: afib, HTN, osteoporosis; fall on 01/12/23  PRECAUTIONS: no precautions in chart review or per discussion w/ pt (>8 weeks out from fall); hx of falls  SUBJECTIVE:  SUBJECTIVE STATEMENT:   04/25/2023 Pt denies any pain today, no issues after last session. Primary complaint is fatigue, has had a busy morning. Feels like she is doing beter with daily activities such as dressing and doing hair, no longer requiring assist. Still has issues reaching overhead. Accompanied by daughter and in person interpreter   PAIN:  Are you having pain: 04/25/2023  none at rest  Location/description: L shoulder, lateral/superior Worst: 3/10 on NPS Per eval: Best-worst over past week: 6/10  - aggravating factors: mostly at night or when moving  - Easing factors: medication, change in positioning, rest   OBJECTIVE: (objective measures completed at initial evaluation unless otherwise dated)   DIAGNOSTIC FINDINGS:   02/08/23 L shoulder MRI:  IMPRESSION: 1. Complete tear of the supraspinatus tendon with 3.5 cm of retraction. 2. Severe tendinosis of the infraspinatus tendon with a small interstitial tear. 3. Mild tendinosis of the subscapularis tendon. 4. Intra-articular portion of the long head of the biceps tendon is attenuated concerning for a complete tear. 5. Nondisplaced impaction fracture of the superolateral humeral head involving the greater tuberosity with subcortical marrow edema.   PATIENT SURVEYS:  FOTO: deferred on eval given time constraints/language, plan to administer as appropriate    COGNITION: Overall cognitive status: Within functional limits for tasks assessed, pleasant and conversationally appropriate, does endorse some mild memory deficits                                  SENSATION: No neuro complaints - light touch intact throughout B UE   POSTURE: Guarded L UE, mild fwd head and kyphosis    UPPER EXTREMITY ROM:   A/PROM Right eval Left Eval AROM Left 04/01/23 Left 04/08/23 AROM Left A/ROM 04/22/23 L A/PROM 04/25/23  Shoulder flexion 165 deg  100 deg  112 deg 120 deg painless  115 deg pre MT, 120 deg post MT / 134 deg painless 110 deg pre session  112 post exercise/ 138 deg   Shoulder abduction 158 deg 92 deg     100 deg  Shoulder internal rotation (functional)  C7 NT    UT  Shoulder external rotation (functional) T12 NT    L5  Elbow flexion          Elbow extension          Wrist flexion          Wrist extension           (Blank rows = not tested) (Key: WFL = within functional limits not formally assessed, * = concordant pain, s = stiffness/stretching sensation, NT = not tested)  Comments: passive ROM approximately equivalent to AROM - limited assessment by muscle guarding   UPPER EXTREMITY MMT:   MMT Right eval Left eval  Shoulder flexion      Shoulder extension      Shoulder abduction      Shoulder extension      Shoulder internal rotation      Shoulder  external rotation      Elbow flexion      Elbow extension      Grip strength      (Blank rows = not tested)  (Key: WFL = within functional limits not formally assessed, * = concordant pain, s = stiffness/stretching sensation, NT = not tested)  Comments: deferred on evaluation   PALPATION:  Notable tightness throughout L UT, LS, deltoid, and lat but pt denies  overt tenderness. No bony tenderness or gross deformities on palpation      TODAY'S TREATMENT:    [facility] Adult PT Treatment:                                                DATE: 04/25/23 Therapeutic Exercise: BTB row 2x10 cues for form and posture, reduced UT compensations  GTB shoulder ext 2x10 RTB IR iso walkout 2x5 cues for posture and reduced compensataion  YTB ER iso walkout x5 cues for appropriate tension and truncal ROM  Shoulder flexion walkbacks at counter x10 RTB bicep curls x10 cues for reduced compensations at shoulder    [facility] Adult PT Treatment:                                                DATE: 04/22/23 Therapeutic Exercise: Pulleys flexion AAROM x20 Pulleys scaption AAROM x20  GTB row 2x15 cues for reduced UT compensations  RTB shoulder extensions 2x10 cues for posture  RTB tricep push down 2x12 cues for form and posture  Shoulder flexion walkbacks at counter 2x10 cues for comfortable ROM and pacing Verbal HEP review    Manual Therapy: Seated; STM L latissimus dorsi, middle trap, rhomboid, LS, UT   [facility] Adult PT Treatment:                                                DATE: 04/15/23 Therapeutic Exercise: Pulley flexion AAROM x20 cues for comfortable ROM  Pulley scaption AAROM x20 cues for comfortable ROM  GTB row 2x15 cues for elbow positioning Tricep pushdown 2x10 LUE only cues for form  Swiss ball flexion at table, standing x20 cues for comfortable ROM HEP update, provided w/ RTB for rows and significant time spent with education re: appropriate performance, pain free movement. Family education w/ daughter  re: this topic as well    PATIENT EDUCATION: Education details: rationale for interventions, HEP, importance of activity modification based on symptoms Person educated: Patient Education method: Explanation, Demonstration, Tactile cues, Verbal cues Education comprehension: verbalized understanding, returned demonstration, verbal cues required, tactile cues required, and needs further education     HOME EXERCISE PROGRAM: Access Code: [email or web address] URL: [email or web address] Date: 04/15/2023 Prepared by: Fransisco Hertz  Exercises - Seated Shoulder Flexion Towel Slide at Table Top  - 1 x daily - 7 x weekly - 2 sets - 10 reps - Gentle Levator Scapulae Stretch  - 1 x daily - 7 x weekly - 1-3 sets - 1-3 reps - 30sec hold - Seated Gentle Upper Trapezius Stretch  - 1 x daily - 7 x weekly - 1-3 sets - 1-3 reps - 30sec hold - Standing Shoulder Row with Anchored Resistance  - 1 x daily - 7 x weekly - 2 sets - 10 reps   ASSESSMENT:   CLINICAL IMPRESSION: 04/25/2023 Pt arrives w/o pain, endorses steady improvement since beginning therapy with reduced pain levels and increased independence w/ ADLs. Still has difficulty reaching overhead and with quick movements. ROM measures remain variable but overall improved compared to start of care, stiffness remains. Continues to progress well  with gradual increase in volume/resistance, no reported increase in resting pain. Will extend dates for POC to accommodate remaining visits in initial POC, plan to update as appropriate pending pt follow up with MD and trajectory over coming visits. Pt departs today's session in no acute distress, all voiced questions/concerns addressed appropriately from PT perspective.    Eval - Patient is a pleasant 72 y.o. woman who was seen today for physical therapy evaluation and treatment for L shoulder pain s/p mechanical fall with anterior dislocation (reduced) and rotator cuff tear. Incident occurred in January 2024, pt >8  weeks out from initial fall and reduction, denies MD restrictions. Pt endorses pain mostly at night and transiently with movement, today demonstrates active and passive limitations of L GH mobility compared to R, no overt TTP or bony deformities noted. Denies any pain on arrival but does endorse feeling improved after mobility during exam. No concern for red flags on today's exam, reportedly progressing well since initial incident. Unable to prescribe HEP or administer FOTO due to time constraints (late check in) but will plan to administer at next visit as able. No adverse events. Recommend skilled PT to address aforementioned deficits and maximize functional independence, pt/daughter in agreement with plan as described below. Pt departs today's session in no acute distress, all voiced questions/concerns addressed appropriately from PT perspective.     OBJECTIVE IMPAIRMENTS: decreased activity tolerance, decreased endurance, decreased mobility, decreased ROM, decreased strength, hypomobility, impaired UE functional use, postural dysfunction, and pain.    ACTIVITY LIMITATIONS: carrying, lifting, bathing, dressing, reach over head, and hygiene/grooming   PARTICIPATION LIMITATIONS: meal prep, cleaning, and laundry   PERSONAL FACTORS: Age and 3+ comorbidities: afib, HTN, osteoporosis  are also affecting patient's functional outcome.    REHAB POTENTIAL: Good   CLINICAL DECISION MAKING: Stable/uncomplicated   EVALUATION COMPLEXITY: Low     GOALS: Goals reviewed with patient? No given time constraints   SHORT TERM GOALS: Target date: 04/08/2023 Pt will demonstrate appropriate understanding and performance of initially prescribed HEP in order to facilitate improved independence with management of symptoms.  Baseline: HEP TBD 04/11/23: Pt endorses good compliance with HEP Goal status: MET   2. Pt will improve greater than or equal to MCID on FOTO in order to demonstrate improved perception of  function due to symptoms.            Baseline: FOTO TBD   04/11/23: FOTO/ODI not administered             Goal status: NOT MET   LONG TERM GOALS: Target date: 05/23/2023 (extended 04/25/23)   Pt will meet predicted score on FOTO in order to demonstrate improved perception of function due to symptoms. Baseline: FOTO TBD Goal status: NOT MET   2.  Pt will demonstrate at least 140 degrees of active shoulder elevation in order to demonstrate improved tolerance to functional movement patterns such as reaching overhead and upper body dressing.  Baseline: see ROM chart above 04/25/23: see ROM chart above Goal status: ONGOING   3.  Pt will demonstrate at least 4/5 shoulder flexion MMT for improved symmetry of UE strength and improved tolerance to functional movements.  Baseline: MMT deferred on eval given proximity to injury 04/25/23: NT Goal status: ONGOING   4. Pt will report ability to perform upper body dressing with less than 2 point increase in pain on NPS in order to indicate improved tolerance to ADLs.            Baseline: 0-6/10 pain  with ADLs  04/25/23: 0-3/10            Goal status: MET   5. Pt will demonstrate appropriate performance of final prescribed HEP in order to facilitate improved self-management of symptoms post-discharge.             Baseline: HEP TBD  04/25/23: endorses good compliance with HEP, no issues            Goal status: MET      6. Pt will endorse at least 50% reduction in overall pain levels to facilitate improved tolerance to daily activities.             Baseline: 0-6/10 on NPS  04/25/23: 0-3/10            Goal status: MET    PLAN (updated 04/25/23):   PT FREQUENCY: 2x/week   PT DURATION: 4 weeks   PLANNED INTERVENTIONS: Therapeutic exercises, Therapeutic activity, Neuromuscular re-education, Balance training, Gait training, Patient/Family education, Self Care, Joint mobilization, Aquatic Therapy, Dry Needling, Electrical stimulation, Spinal mobilization,  Cryotherapy, Moist heat, Taping, Vasopneumatic device, Manual therapy, and Re-evaluation   PLAN FOR NEXT SESSION: continue to progress GH mobility as able/appropriate, periscapular resistance training as tolerated     Ashley Murrain PT, DPT 04/25/2023 3:00 PM

## 2023-04-25 ENCOUNTER — Encounter: Payer: Self-pay | Admitting: Physical Therapy

## 2023-04-25 ENCOUNTER — Ambulatory Visit: Payer: Self-pay | Attending: Physician Assistant | Admitting: Physical Therapy

## 2023-04-25 DIAGNOSIS — M25512 Pain in left shoulder: Secondary | ICD-10-CM | POA: Insufficient documentation

## 2023-04-25 DIAGNOSIS — M6281 Muscle weakness (generalized): Secondary | ICD-10-CM | POA: Insufficient documentation

## 2023-04-25 DIAGNOSIS — M25612 Stiffness of left shoulder, not elsewhere classified: Secondary | ICD-10-CM | POA: Insufficient documentation

## 2023-04-28 NOTE — Therapy (Signed)
OUTPATIENT PHYSICAL THERAPY TREATMENT NOTE   Patient Name: [name] [name] MRN: [id number] DOB:10-03-1951, 72 y.o., female Today's Date: 04/29/2023   Progress Note Reporting Period 03/11/2023 to 04/25/2023  PCP: Grayce Sessions, NP  REFERRING PROVIDER: Tarry Kos, MD   END OF SESSION:   PT End of Session - 04/29/23 1630     Visit Number 11    Number of Visits 17    Date for PT Re-Evaluation 05/23/23    Authorization Type none    PT Start Time 1631    PT Stop Time 1710    PT Time Calculation (min) 39 min    Activity Tolerance Patient tolerated treatment well;No increased pain    Behavior During Therapy [facility] for tasks assessed/performed               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   Hypertension    Osteoarthritis of ankle, right    and Subtalar Joint   Osteoporosis    Presbyopia of both eyes 01/28/2017   Pterygium eye, 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   Past Surgical History:  Procedure Laterality Date   ANKLE FUSION Right 12/24/2017   Tibiocalcaneal fusion   ANKLE FUSION Right 12/24/2017   Procedure: RIGHT TIBIOCALCANEAL FUSION;  Surgeon: Nadara Mustard, MD;  Location: [facility] OR;  Service: Orthopedics;  Laterality: Right;   CHOLECYSTECTOMY     Patient Active Problem List   Diagnosis Date Noted   Atrial fibrillation (HCC) 10/01/2019   Atrial fibrillation with RVR (HCC) 10/01/2019   Osteopenia of multiple sites 02/05/2019   Hyperlipidemia 02/05/2019   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/2017   Post-traumatic osteoarthritis, right ankle and foot    Seasonal allergic rhinitis due to pollen 05/12/2017   Allergy    Cataract 01/28/2017   Presbyopia of both eyes 01/28/2017   Pterygium eye, 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   Astigmatism, 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   Hypermetropia,  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   Acquired bilateral flat feet 09/07/2015   Osteoporosis 09/07/2015   Elevated troponin 01/26/2015   Generalized anxiety disorder 01/26/2015   Hypertension 07/28/2014   Gastritis 07/28/2014   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   OBESITY 05/10/2009   NEPHROLITHIASIS 06/15/2008   Dyslipidemia 03/16/2008   OSTEOARTHRITIS, LUMBAR SPINE 01/09/2008   BUNDLE BRANCH BLOCK, LEFT 12/11/2007   PTSD 10/01/2007   ARM PAIN, LEFT 10/01/2007    REFERRING DIAG:  W29.92XA (ICD-10-CM) - Traumatic closed displaced fracture of left shoulder with anterior dislocation, initial encounter   THERAPY DIAG:  Left shoulder pain, unspecified chronicity  Stiffness of left shoulder, not elsewhere classified  Muscle weakness (generalized)  Rationale for Evaluation and Treatment Rehabilitation  PERTINENT HISTORY: afib, HTN, osteoporosis; fall on 01/12/23  PRECAUTIONS: no precautions in chart review or per discussion w/ pt (>8 weeks out from fall); hx of falls  SUBJECTIVE:  SUBJECTIVE STATEMENT:   04/29/2023 Pt arrives without resting pain. Notes pain levels have improved significantly, mostly stiffness at this point. HEP going well. Accompanied by daughter and in person interpreter   PAIN:  Are you having pain: 04/29/2023  none at rest  Location/description: L shoulder, lateral/superior Worst: 3/10 on NPS Per eval: Best-worst over past week: 6/10  - aggravating factors: mostly at night or when moving  - Easing factors: medication, change in positioning, rest   OBJECTIVE: (objective measures completed at initial evaluation unless otherwise dated)   DIAGNOSTIC FINDINGS:  02/08/23 L shoulder MRI:  IMPRESSION: 1. Complete tear of the supraspinatus tendon with 3.5 cm of retraction. 2. Severe tendinosis of the  infraspinatus tendon with a small interstitial tear. 3. Mild tendinosis of the subscapularis tendon. 4. Intra-articular portion of the long head of the biceps tendon is attenuated concerning for a complete tear. 5. Nondisplaced impaction fracture of the superolateral humeral head involving the greater tuberosity with subcortical marrow edema.   PATIENT SURVEYS:  FOTO: deferred on eval given time constraints/language, plan to administer as appropriate    COGNITION: Overall cognitive status: Within functional limits for tasks assessed, pleasant and conversationally appropriate, does endorse some mild memory deficits                                  SENSATION: No neuro complaints - light touch intact throughout B UE   POSTURE: Guarded L UE, mild fwd head and kyphosis    UPPER EXTREMITY ROM:   A/PROM Right eval Left Eval AROM Left 04/01/23 Left 04/08/23 AROM Left A/ROM 04/22/23 L A/PROM 04/25/23  Shoulder flexion 165 deg  100 deg  112 deg 120 deg painless  115 deg pre MT, 120 deg post MT / 134 deg painless 110 deg pre session  112 post exercise/ 138 deg   Shoulder abduction 158 deg 92 deg     100 deg  Shoulder internal rotation (functional)  C7 NT    UT  Shoulder external rotation (functional) T12 NT    L5  Elbow flexion          Elbow extension          Wrist flexion          Wrist extension           (Blank rows = not tested) (Key: WFL = within functional limits not formally assessed, * = concordant pain, s = stiffness/stretching sensation, NT = not tested)  Comments: passive ROM approximately equivalent to AROM - limited assessment by muscle guarding   UPPER EXTREMITY MMT:   MMT Right eval Left eval  Shoulder flexion      Shoulder extension      Shoulder abduction      Shoulder extension      Shoulder internal rotation      Shoulder external rotation      Elbow flexion      Elbow extension      Grip strength      (Blank rows = not tested)  (Key: WFL = within functional  limits not formally assessed, * = concordant pain, s = stiffness/stretching sensation, NT = not tested)  Comments: deferred on evaluation   PALPATION:  Notable tightness throughout L UT, LS, deltoid, and lat but pt denies overt tenderness. No bony tenderness or gross deformities on palpation      TODAY'S TREATMENT:    [facility] Adult PT Treatment:  DATE: 04/28/23 Therapeutic Exercise: Supine dowel flexion AAROM 2x10 cues for comfortable ROM  Supine R GH flexion AROM 2x8 cues for comfortable ROM BTB 2x12 row cues for UT mechanics GTB shoulder ext 2x12 cues for reduced compensations at elbow YTB LUE ER 3x8 cues for reduced compensations at elbow 2# bicep curl 2x15 cues for pacing   [facility] Adult PT Treatment:                                                DATE: 04/25/23 Therapeutic Exercise: BTB row 2x10 cues for form and posture, reduced UT compensations  GTB shoulder ext 2x10 RTB IR iso walkout 2x5 cues for posture and reduced compensataion  YTB ER iso walkout x5 cues for appropriate tension and truncal ROM  Shoulder flexion walkbacks at counter x10 RTB bicep curls x10 cues for reduced compensations at shoulder    [facility] Adult PT Treatment:                                                DATE: 04/22/23 Therapeutic Exercise: Pulleys flexion AAROM x20 Pulleys scaption AAROM x20  GTB row 2x15 cues for reduced UT compensations  RTB shoulder extensions 2x10 cues for posture  RTB tricep push down 2x12 cues for form and posture  Shoulder flexion walkbacks at counter 2x10 cues for comfortable ROM and pacing Verbal HEP review    Manual Therapy: Seated; STM L latissimus dorsi, middle trap, rhomboid, LS, UT   [facility] Adult PT Treatment:                                                DATE: 04/15/23 Therapeutic Exercise: Pulley flexion AAROM x20 cues for comfortable ROM  Pulley scaption AAROM x20 cues for comfortable ROM  GTB row 2x15 cues for elbow  positioning Tricep pushdown 2x10 LUE only cues for form  Swiss ball flexion at table, standing x20 cues for comfortable ROM HEP update, provided w/ RTB for rows and significant time spent with education re: appropriate performance, pain free movement. Family education w/ daughter re: this topic as well    PATIENT EDUCATION: Education details: rationale for interventions, HEP, importance of activity modification based on symptoms Person educated: Patient Education method: Explanation, Demonstration, Tactile cues, Verbal cues Education comprehension: verbalized understanding, returned demonstration, verbal cues required, tactile cues required, and needs further education     HOME EXERCISE PROGRAM: Access Code: [email or web address] URL: [email or web address] Date: 04/15/2023 Prepared by: Fransisco Hertz  Exercises - Seated Shoulder Flexion Towel Slide at Table Top  - 1 x daily - 7 x weekly - 2 sets - 10 reps - Gentle Levator Scapulae Stretch  - 1 x daily - 7 x weekly - 1-3 sets - 1-3 reps - 30sec hold - Seated Gentle Upper Trapezius Stretch  - 1 x daily - 7 x weekly - 1-3 sets - 1-3 reps - 30sec hold - Standing Shoulder Row with Anchored Resistance  - 1 x daily - 7 x weekly - 2 sets - 10 reps   ASSESSMENT:   CLINICAL IMPRESSION: 04/29/2023 Pt arrives w/o pain, continues to  endorse steady improvement in pain, primary report of stiffness that improves w/ exercise. Today focusing on continued progression of periscapular strengthening and gentle progression for GH strengthening as tolerated. Pt denies any pain with activity. Also incorporated more GH AAROM and gravity reduced AROM to work on mobility as stiffness is primary reported complaint. No adverse events, pt departs without pain, endorses improved stiffness. Pt departs today's session in no acute distress, all voiced questions/concerns addressed appropriately from PT perspective.     Eval - Patient is a pleasant 72 y.o. woman who was seen  today for physical therapy evaluation and treatment for L shoulder pain s/p mechanical fall with anterior dislocation (reduced) and rotator cuff tear. Incident occurred in January 2024, pt >8 weeks out from initial fall and reduction, denies MD restrictions. Pt endorses pain mostly at night and transiently with movement, today demonstrates active and passive limitations of L GH mobility compared to R, no overt TTP or bony deformities noted. Denies any pain on arrival but does endorse feeling improved after mobility during exam. No concern for red flags on today's exam, reportedly progressing well since initial incident. Unable to prescribe HEP or administer FOTO due to time constraints (late check in) but will plan to administer at next visit as able. No adverse events. Recommend skilled PT to address aforementioned deficits and maximize functional independence, pt/daughter in agreement with plan as described below. Pt departs today's session in no acute distress, all voiced questions/concerns addressed appropriately from PT perspective.     OBJECTIVE IMPAIRMENTS: decreased activity tolerance, decreased endurance, decreased mobility, decreased ROM, decreased strength, hypomobility, impaired UE functional use, postural dysfunction, and pain.    ACTIVITY LIMITATIONS: carrying, lifting, bathing, dressing, reach over head, and hygiene/grooming   PARTICIPATION LIMITATIONS: meal prep, cleaning, and laundry   PERSONAL FACTORS: Age and 3+ comorbidities: afib, HTN, osteoporosis  are also affecting patient's functional outcome.    REHAB POTENTIAL: Good   CLINICAL DECISION MAKING: Stable/uncomplicated   EVALUATION COMPLEXITY: Low     GOALS: Goals reviewed with patient? No given time constraints   SHORT TERM GOALS: Target date: 04/08/2023 Pt will demonstrate appropriate understanding and performance of initially prescribed HEP in order to facilitate improved independence with management of symptoms.   Baseline: HEP TBD 04/11/23: Pt endorses good compliance with HEP Goal status: MET   2. Pt will improve greater than or equal to MCID on FOTO in order to demonstrate improved perception of function due to symptoms.            Baseline: FOTO TBD   04/11/23: FOTO/ODI not administered             Goal status: NOT MET   LONG TERM GOALS: Target date: 05/23/2023 (extended 04/25/23)   Pt will meet predicted score on FOTO in order to demonstrate improved perception of function due to symptoms. Baseline: FOTO TBD Goal status: NOT MET   2.  Pt will demonstrate at least 140 degrees of active shoulder elevation in order to demonstrate improved tolerance to functional movement patterns such as reaching overhead and upper body dressing.  Baseline: see ROM chart above 04/25/23: see ROM chart above Goal status: ONGOING   3.  Pt will demonstrate at least 4/5 shoulder flexion MMT for improved symmetry of UE strength and improved tolerance to functional movements.  Baseline: MMT deferred on eval given proximity to injury 04/25/23: NT Goal status: ONGOING   4. Pt will report ability to perform upper body dressing with less than 2 point increase  in pain on NPS in order to indicate improved tolerance to ADLs.            Baseline: 0-6/10 pain with ADLs  04/25/23: 0-3/10            Goal status: MET   5. Pt will demonstrate appropriate performance of final prescribed HEP in order to facilitate improved self-management of symptoms post-discharge.             Baseline: HEP TBD  04/25/23: endorses good compliance with HEP, no issues            Goal status: MET      6. Pt will endorse at least 50% reduction in overall pain levels to facilitate improved tolerance to daily activities.             Baseline: 0-6/10 on NPS  04/25/23: 0-3/10            Goal status: MET    PLAN (updated 04/25/23):   PT FREQUENCY: 2x/week   PT DURATION: 4 weeks   PLANNED INTERVENTIONS: Therapeutic exercises, Therapeutic activity,  Neuromuscular re-education, Balance training, Gait training, Patient/Family education, Self Care, Joint mobilization, Aquatic Therapy, Dry Needling, Electrical stimulation, Spinal mobilization, Cryotherapy, Moist heat, Taping, Vasopneumatic device, Manual therapy, and Re-evaluation   PLAN FOR NEXT SESSION: continue to progress GH mobility as able/appropriate, periscapular resistance training as tolerated     Ashley Murrain PT, DPT 04/29/2023 5:16 PM

## 2023-04-29 ENCOUNTER — Ambulatory Visit: Payer: Self-pay | Admitting: Physical Therapy

## 2023-04-29 ENCOUNTER — Encounter: Payer: Self-pay | Admitting: Physical Therapy

## 2023-04-29 DIAGNOSIS — M6281 Muscle weakness (generalized): Secondary | ICD-10-CM

## 2023-04-29 DIAGNOSIS — M25512 Pain in left shoulder: Secondary | ICD-10-CM

## 2023-04-29 DIAGNOSIS — M25612 Stiffness of left shoulder, not elsewhere classified: Secondary | ICD-10-CM

## 2023-05-01 NOTE — Therapy (Signed)
OUTPATIENT PHYSICAL THERAPY TREATMENT NOTE   Patient Name: [name] [name] MRN: [id number] DOB:21-Nov-1951, 72 y.o., female Today's Date: 05/02/2023   Progress Note Reporting Period 03/11/2023 to 04/25/2023  PCP: Grayce Sessions, NP  REFERRING PROVIDER: Tarry Kos, MD   END OF SESSION:   PT End of Session - 05/02/23 0932     Visit Number 12    Number of Visits 17    Date for PT Re-Evaluation 05/23/23    Authorization Type none    PT Start Time 0932    PT Stop Time 1016    PT Time Calculation (min) 44 min    Activity Tolerance Patient tolerated treatment well;No increased pain    Behavior During Therapy [facility] for tasks assessed/performed                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   Hypertension    Osteoarthritis of ankle, right    and Subtalar Joint   Osteoporosis    Presbyopia of both eyes 01/28/2017   Pterygium eye, bilateral 01/28/2017   Past Surgical History:  Procedure Laterality Date   ANKLE FUSION Right 12/24/2017   Tibiocalcaneal fusion   ANKLE FUSION Right 12/24/2017   Procedure: RIGHT TIBIOCALCANEAL FUSION;  Surgeon: Nadara Mustard, MD;  Location: [facility];  Service: Orthopedics;  Laterality: Right;   CHOLECYSTECTOMY     Patient Active Problem List   Diagnosis Date Noted   Atrial fibrillation (HCC) 10/01/2019   Atrial fibrillation with RVR (HCC) 10/01/2019   Osteopenia of multiple sites 02/05/2019   Hyperlipidemia 02/05/2019   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/2017   Post-traumatic osteoarthritis, right ankle and foot    Seasonal allergic rhinitis due to pollen 05/12/2017   Allergy    Cataract 01/28/2017   Presbyopia of both eyes 01/28/2017   Pterygium eye, bilateral 01/28/2017   Astigmatism, bilateral 01/28/2017    Hypermetropia, bilateral 01/28/2017   Acquired bilateral flat feet 09/07/2015   Osteoporosis 09/07/2015   Elevated troponin 01/26/2015   Generalized anxiety disorder 01/26/2015   Hypertension 07/28/2014   Gastritis 07/28/2014   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   OBESITY 05/10/2009   NEPHROLITHIASIS 06/15/2008   Dyslipidemia 03/16/2008   OSTEOARTHRITIS, LUMBAR SPINE 01/09/2008   BUNDLE BRANCH BLOCK, LEFT 12/11/2007   PTSD 10/01/2007   ARM PAIN, LEFT 10/01/2007    REFERRING DIAG:  W09.92XA (ICD-10-CM) - Traumatic closed displaced fracture of left shoulder with anterior dislocation, initial encounter   THERAPY DIAG:  Left shoulder pain, unspecified chronicity  Stiffness of left shoulder, not elsewhere classified  Muscle weakness (generalized)  Rationale for Evaluation and Treatment Rehabilitation  PERTINENT HISTORY: afib, HTN, osteoporosis; fall on 01/12/23  PRECAUTIONS: no precautions in chart review or per discussion w/ pt (>8 weeks out from fall); hx of falls  SUBJECTIVE:  SUBJECTIVE STATEMENT:   05/02/2023 Pt arrives w/o resting pain. Reports a little fatigue/soreness that resolved quickly after last session. States she has been weaning off pain meds with good tolerance. No other new updates. Daughter and in person interpreter present for assistance with translation today.    PAIN:  Are you having pain: 05/02/2023  none at rest  Location/description: L shoulder, lateral/superior Worst: 3/10 on NPS Per eval: Best-worst over past week: 6/10  - aggravating factors: mostly at night or when moving  - Easing factors: medication, change in positioning, rest   OBJECTIVE: (objective measures completed at initial evaluation unless otherwise dated)   DIAGNOSTIC FINDINGS:  02/08/23 L shoulder MRI:   IMPRESSION: 1. Complete tear of the supraspinatus tendon with 3.5 cm of retraction. 2. Severe tendinosis of the infraspinatus tendon with a small interstitial tear. 3. Mild tendinosis of the subscapularis tendon. 4. Intra-articular portion of the long head of the biceps tendon is attenuated concerning for a complete tear. 5. Nondisplaced impaction fracture of the superolateral humeral head involving the greater tuberosity with subcortical marrow edema.   PATIENT SURVEYS:  FOTO: deferred on eval given time constraints/language, plan to administer as appropriate    COGNITION: Overall cognitive status: Within functional limits for tasks assessed, pleasant and conversationally appropriate, does endorse some mild memory deficits                                  SENSATION: No neuro complaints - light touch intact throughout B UE   POSTURE: Guarded L UE, mild fwd head and kyphosis    UPPER EXTREMITY ROM:   A/PROM Right eval Left Eval AROM Left 04/01/23 Left 04/08/23 AROM Left A/ROM 04/22/23 L A/PROM 04/25/23  Shoulder flexion 165 deg  100 deg  112 deg 120 deg painless  115 deg pre MT, 120 deg post MT / 134 deg painless 110 deg pre session  112 post exercise/ 138 deg   Shoulder abduction 158 deg 92 deg     100 deg  Shoulder internal rotation (functional)  C7 NT    UT  Shoulder external rotation (functional) T12 NT    L5  Elbow flexion          Elbow extension          Wrist flexion          Wrist extension           (Blank rows = not tested) (Key: WFL = within functional limits not formally assessed, * = concordant pain, s = stiffness/stretching sensation, NT = not tested)  Comments: passive ROM approximately equivalent to AROM - limited assessment by muscle guarding   UPPER EXTREMITY MMT:   MMT Right eval Left eval  Shoulder flexion      Shoulder extension      Shoulder abduction      Shoulder extension      Shoulder internal rotation      Shoulder external rotation       Elbow flexion      Elbow extension      Grip strength      (Blank rows = not tested)  (Key: WFL = within functional limits not formally assessed, * = concordant pain, s = stiffness/stretching sensation, NT = not tested)  Comments: deferred on evaluation   PALPATION:  Notable tightness throughout L UT, LS, deltoid, and lat but pt denies overt tenderness. No bony tenderness or gross deformities  on palpation      TODAY'S TREATMENT:    [facility] Adult PT Treatment:                                                DATE: 05/02/23 Therapeutic Exercise: Supine shoulder flexion AAROM 2x12 cues for comfortable ROM Seated shoulder flex/scaption AROM x8 each cues for comfortable  BTB 3x12 row cues for posture and comfortable ROM  Swiss ball flexion x10 cues for comfortable ROM  HEP update + education  Therapeutic Activity: Significant time spent discussing symptoms, activity modification, progress with PT, PT POC, and follow up with provider    [facility] Adult PT Treatment:                                                DATE: 04/28/23 Therapeutic Exercise: Supine dowel flexion AAROM 2x10 cues for comfortable ROM  Supine R GH flexion AROM 2x8 cues for comfortable ROM BTB 2x12 row cues for UT mechanics GTB shoulder ext 2x12 cues for reduced compensations at elbow YTB LUE ER 3x8 cues for reduced compensations at elbow 2# bicep curl 2x15 cues for pacing   [facility] Adult PT Treatment:                                                DATE: 04/25/23 Therapeutic Exercise: BTB row 2x10 cues for form and posture, reduced UT compensations  GTB shoulder ext 2x10 RTB IR iso walkout 2x5 cues for posture and reduced compensataion  YTB ER iso walkout x5 cues for appropriate tension and truncal ROM  Shoulder flexion walkbacks at counter x10 RTB bicep curls x10 cues for reduced compensations at shoulder    [facility] Adult PT Treatment:                                                DATE: 04/22/23 Therapeutic Exercise: Pulleys  flexion AAROM x20 Pulleys scaption AAROM x20  GTB row 2x15 cues for reduced UT compensations  RTB shoulder extensions 2x10 cues for posture  RTB tricep push down 2x12 cues for form and posture  Shoulder flexion walkbacks at counter 2x10 cues for comfortable ROM and pacing Verbal HEP review    Manual Therapy: Seated; STM L latissimus dorsi, middle trap, rhomboid, LS, UT   [facility] Adult PT Treatment:                                                DATE: 04/15/23 Therapeutic Exercise: Pulley flexion AAROM x20 cues for comfortable ROM  Pulley scaption AAROM x20 cues for comfortable ROM  GTB row 2x15 cues for elbow positioning Tricep pushdown 2x10 LUE only cues for form  Swiss ball flexion at table, standing x20 cues for comfortable ROM HEP update, provided w/ RTB for rows and significant time spent with education re: appropriate performance, pain  free movement. Family education w/ daughter re: this topic as well    PATIENT EDUCATION: Education details: rationale for interventions, HEP, importance of activity modification based on symptoms Person educated: Patient Education method: Explanation, Demonstration, Tactile cues, Verbal cues Education comprehension: verbalized understanding, returned demonstration, verbal cues required, tactile cues required, and needs further education     HOME EXERCISE PROGRAM: Access Code: [email or web address] URL: [email or web address] Date: 05/02/2023 Prepared by: Fransisco Hertz  Exercises - Seated Shoulder Flexion Towel Slide at Table Top  - 1 x daily - 7 x weekly - 2 sets - 10 reps - Gentle Levator Scapulae Stretch  - 1 x daily - 7 x weekly - 1-3 sets - 1-3 reps - 30sec hold - Seated Gentle Upper Trapezius Stretch  - 1 x daily - 7 x weekly - 1-3 sets - 1-3 reps - 30sec hold - Standing Shoulder Row with Anchored Resistance  - 1 x daily - 7 x weekly - 2 sets - 10 reps - Supine Shoulder Flexion Extension AAROM with Dowel  - 1 x daily - 7 x weekly - 2 sets  - 10 reps   ASSESSMENT:   CLINICAL IMPRESSION: 05/02/2023 Pt arrives w/o pain, states she has been doing well and is trying to wean off of pain meds. HEP going well. Today able to progress for increased volume with periscapular strengthening, as well as GH mobility. Mild discomfort initially with GH flexion stretching that improves with repetition and resolves. Denies any pain on departure, no adverse events. Significant time spent discussing PT POC, progress, activity modification based on symptoms, and follow up with provider to ensure appropriate tissue healing. Recommend continuing along current POC in order to address relevant deficits and improve functional tolerance. Pt departs today's session in no acute distress, all voiced questions/concerns addressed appropriately from PT perspective.     Eval - Patient is a pleasant 72 y.o. woman who was seen today for physical therapy evaluation and treatment for L shoulder pain s/p mechanical fall with anterior dislocation (reduced) and rotator cuff tear. Incident occurred in January 2024, pt >8 weeks out from initial fall and reduction, denies MD restrictions. Pt endorses pain mostly at night and transiently with movement, today demonstrates active and passive limitations of L GH mobility compared to R, no overt TTP or bony deformities noted. Denies any pain on arrival but does endorse feeling improved after mobility during exam. No concern for red flags on today's exam, reportedly progressing well since initial incident. Unable to prescribe HEP or administer FOTO due to time constraints (late check in) but will plan to administer at next visit as able. No adverse events. Recommend skilled PT to address aforementioned deficits and maximize functional independence, pt/daughter in agreement with plan as described below. Pt departs today's session in no acute distress, all voiced questions/concerns addressed appropriately from PT perspective.     OBJECTIVE  IMPAIRMENTS: decreased activity tolerance, decreased endurance, decreased mobility, decreased ROM, decreased strength, hypomobility, impaired UE functional use, postural dysfunction, and pain.    ACTIVITY LIMITATIONS: carrying, lifting, bathing, dressing, reach over head, and hygiene/grooming   PARTICIPATION LIMITATIONS: meal prep, cleaning, and laundry   PERSONAL FACTORS: Age and 3+ comorbidities: afib, HTN, osteoporosis  are also affecting patient's functional outcome.    REHAB POTENTIAL: Good   CLINICAL DECISION MAKING: Stable/uncomplicated   EVALUATION COMPLEXITY: Low     GOALS: Goals reviewed with patient? No given time constraints   SHORT TERM GOALS: Target date: 04/08/2023 Pt will demonstrate appropriate understanding and performance  of initially prescribed HEP in order to facilitate improved independence with management of symptoms.  Baseline: HEP TBD 04/11/23: Pt endorses good compliance with HEP Goal status: MET   2. Pt will improve greater than or equal to MCID on FOTO in order to demonstrate improved perception of function due to symptoms.            Baseline: FOTO TBD   04/11/23: FOTO/ODI not administered             Goal status: NOT MET   LONG TERM GOALS: Target date: 05/23/2023 (extended 04/25/23)   Pt will meet predicted score on FOTO in order to demonstrate improved perception of function due to symptoms. Baseline: FOTO TBD Goal status: NOT MET   2.  Pt will demonstrate at least 140 degrees of active shoulder elevation in order to demonstrate improved tolerance to functional movement patterns such as reaching overhead and upper body dressing.  Baseline: see ROM chart above 04/25/23: see ROM chart above Goal status: ONGOING   3.  Pt will demonstrate at least 4/5 shoulder flexion MMT for improved symmetry of UE strength and improved tolerance to functional movements.  Baseline: MMT deferred on eval given proximity to injury 04/25/23: NT Goal status: ONGOING   4. Pt  will report ability to perform upper body dressing with less than 2 point increase in pain on NPS in order to indicate improved tolerance to ADLs.            Baseline: 0-6/10 pain with ADLs  04/25/23: 0-3/10            Goal status: MET   5. Pt will demonstrate appropriate performance of final prescribed HEP in order to facilitate improved self-management of symptoms post-discharge.             Baseline: HEP TBD  04/25/23: endorses good compliance with HEP, no issues            Goal status: MET      6. Pt will endorse at least 50% reduction in overall pain levels to facilitate improved tolerance to daily activities.             Baseline: 0-6/10 on NPS  04/25/23: 0-3/10            Goal status: MET    PLAN (updated 04/25/23):   PT FREQUENCY: 2x/week   PT DURATION: 4 weeks   PLANNED INTERVENTIONS: Therapeutic exercises, Therapeutic activity, Neuromuscular re-education, Balance training, Gait training, Patient/Family education, Self Care, Joint mobilization, Aquatic Therapy, Dry Needling, Electrical stimulation, Spinal mobilization, Cryotherapy, Moist heat, Taping, Vasopneumatic device, Manual therapy, and Re-evaluation   PLAN FOR NEXT SESSION: continue to progress GH mobility as able/appropriate, periscapular resistance training as tolerated     Ashley Murrain PT, DPT 05/02/2023 10:52 AM

## 2023-05-02 ENCOUNTER — Ambulatory Visit: Payer: Self-pay | Admitting: Physical Therapy

## 2023-05-02 ENCOUNTER — Encounter: Payer: Self-pay | Admitting: Physical Therapy

## 2023-05-02 DIAGNOSIS — M25612 Stiffness of left shoulder, not elsewhere classified: Secondary | ICD-10-CM

## 2023-05-02 DIAGNOSIS — M25512 Pain in left shoulder: Secondary | ICD-10-CM

## 2023-05-02 DIAGNOSIS — M6281 Muscle weakness (generalized): Secondary | ICD-10-CM

## 2023-05-05 ENCOUNTER — Other Ambulatory Visit: Payer: Self-pay

## 2023-05-05 ENCOUNTER — Encounter: Payer: Self-pay | Admitting: Family Medicine

## 2023-05-05 ENCOUNTER — Ambulatory Visit: Payer: Self-pay | Attending: Family Medicine | Admitting: Family Medicine

## 2023-05-05 VITALS — BP 112/72 | HR 71 | Ht 62.0 in | Wt 187.0 lb

## 2023-05-05 DIAGNOSIS — E7800 Pure hypercholesterolemia, unspecified: Secondary | ICD-10-CM

## 2023-05-05 DIAGNOSIS — K0889 Other specified disorders of teeth and supporting structures: Secondary | ICD-10-CM

## 2023-05-05 DIAGNOSIS — S4292XD Fracture of left shoulder girdle, part unspecified, subsequent encounter for fracture with routine healing: Secondary | ICD-10-CM

## 2023-05-05 DIAGNOSIS — I10 Essential (primary) hypertension: Secondary | ICD-10-CM

## 2023-05-05 NOTE — Therapy (Signed)
OUTPATIENT PHYSICAL THERAPY TREATMENT NOTE   Patient Name: [name] [name] MRN: [id number] DOB:08/09/1951, 72 y.o., female Today's Date: 05/06/2023   Progress Note Reporting Period 03/11/2023 to 04/25/2023  PCP: Grayce Sessions, NP  REFERRING PROVIDER: Tarry Kos, MD   END OF SESSION:   PT End of Session - 05/06/23 0941     Visit Number 13    Number of Visits 17    Date for PT Re-Evaluation 05/23/23    Authorization Type none    PT Start Time [phone]   late check in   PT Stop Time 1016    PT Time Calculation (min) 38 min    Activity Tolerance Patient tolerated treatment well;No increased pain    Behavior During Therapy [facility] for tasks assessed/performed                 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   Hypertension    Osteoarthritis of ankle, right    and Subtalar Joint   Osteoporosis    Presbyopia of both eyes 01/28/2017   Pterygium eye, bilateral 01/28/2017   Past Surgical History:  Procedure Laterality Date   ANKLE FUSION Right 12/24/2017   Tibiocalcaneal fusion   ANKLE FUSION Right 12/24/2017   Procedure: RIGHT TIBIOCALCANEAL FUSION;  Surgeon: Nadara Mustard, MD;  Location: [facility] OR;  Service: Orthopedics;  Laterality: Right;   CHOLECYSTECTOMY     Patient Active Problem List   Diagnosis Date Noted   Atrial fibrillation (HCC) 10/01/2019   Atrial fibrillation with RVR (HCC) 10/01/2019   Osteopenia of multiple sites 02/05/2019   Hyperlipidemia 02/05/2019   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/2017   Post-traumatic osteoarthritis, right ankle and foot    Seasonal allergic rhinitis due to pollen 05/12/2017   Allergy    Cataract 01/28/2017   Presbyopia of both eyes 01/28/2017   Pterygium eye, bilateral 01/28/2017   Astigmatism, bilateral  01/28/2017   Hypermetropia, bilateral 01/28/2017   Acquired bilateral flat feet 09/07/2015   Osteoporosis 09/07/2015   Elevated troponin 01/26/2015   Generalized anxiety disorder 01/26/2015   Hypertension 07/28/2014   Gastritis 07/28/2014   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   OBESITY 05/10/2009   NEPHROLITHIASIS 06/15/2008   Dyslipidemia 03/16/2008   OSTEOARTHRITIS, LUMBAR SPINE 01/09/2008   BUNDLE BRANCH BLOCK, LEFT 12/11/2007   PTSD 10/01/2007   ARM PAIN, LEFT 10/01/2007    REFERRING DIAG:  J19.92XA (ICD-10-CM) - Traumatic closed displaced fracture of left shoulder with anterior dislocation, initial encounter   THERAPY DIAG:  Left shoulder pain, unspecified chronicity  Stiffness of left shoulder, not elsewhere classified  Muscle weakness (generalized)  Rationale for Evaluation and Treatment Rehabilitation  PERTINENT HISTORY: afib, HTN, osteoporosis; fall on 01/12/23  PRECAUTIONS: no precautions in chart review or per discussion w/ pt (>8 weeks out from fall); hx of falls  SUBJECTIVE:  SUBJECTIVE STATEMENT:   05/06/2023 Pt states she continues to slowly improve. Felt fine after last session. Saw PCP yesterday. Still needs to follow up with ortho for shoulder. Accompanied by daughter, use of video interpreter initially, in person interpreter arrives after first couple minutes and is present throughout.    PAIN:  Are you having pain: 05/06/2023  none at rest  Location/description: L shoulder, lateral/superior Worst: 3/10 on NPS Per eval: Best-worst over past week: 6/10  - aggravating factors: mostly at night or when moving  - Easing factors: medication, change in positioning, rest   OBJECTIVE: (objective measures completed at initial evaluation unless otherwise dated)   DIAGNOSTIC FINDINGS:   02/08/23 L shoulder MRI:  IMPRESSION: 1. Complete tear of the supraspinatus tendon with 3.5 cm of retraction. 2. Severe tendinosis of the infraspinatus tendon with a small interstitial tear. 3. Mild tendinosis of the subscapularis tendon. 4. Intra-articular portion of the long head of the biceps tendon is attenuated concerning for a complete tear. 5. Nondisplaced impaction fracture of the superolateral humeral head involving the greater tuberosity with subcortical marrow edema.   PATIENT SURVEYS:  FOTO: deferred on eval given time constraints/language, plan to administer as appropriate    COGNITION: Overall cognitive status: Within functional limits for tasks assessed, pleasant and conversationally appropriate, does endorse some mild memory deficits                                  SENSATION: No neuro complaints - light touch intact throughout B UE   POSTURE: Guarded L UE, mild fwd head and kyphosis    UPPER EXTREMITY ROM:   A/PROM Right eval Left Eval AROM Left 04/01/23 Left 04/08/23 AROM Left A/ROM 04/22/23 L A/PROM 04/25/23  Shoulder flexion 165 deg  100 deg  112 deg 120 deg painless  115 deg pre MT, 120 deg post MT / 134 deg painless 110 deg pre session  112 post exercise/ 138 deg   Shoulder abduction 158 deg 92 deg     100 deg  Shoulder internal rotation (functional)  C7 NT    UT  Shoulder external rotation (functional) T12 NT    L5  Elbow flexion          Elbow extension          Wrist flexion          Wrist extension           (Blank rows = not tested) (Key: WFL = within functional limits not formally assessed, * = concordant pain, s = stiffness/stretching sensation, NT = not tested)  Comments: passive ROM approximately equivalent to AROM - limited assessment by muscle guarding   UPPER EXTREMITY MMT:   MMT Right eval Left eval  Shoulder flexion      Shoulder extension      Shoulder abduction      Shoulder extension      Shoulder internal rotation      Shoulder  external rotation      Elbow flexion      Elbow extension      Grip strength      (Blank rows = not tested)  (Key: WFL = within functional limits not formally assessed, * = concordant pain, s = stiffness/stretching sensation, NT = not tested)  Comments: deferred on evaluation   PALPATION:  Notable tightness throughout L UT, LS, deltoid, and lat but pt denies overt tenderness. No bony tenderness  or gross deformities on palpation      TODAY'S TREATMENT:    [facility] Adult PT Treatment:                                                DATE: 05/06/23 Therapeutic Exercise: Blue band row 2x10 cues for form and setup GTB shoulder extension 2x15 cues for posture  Supine shoulder flex AAROM with dowel 2x12 cues for comfortable ROM and appropriate grip width  Pulley flexion AAROM Pulley scaption AAROM  HEP review   [facility] Adult PT Treatment:                                                DATE: 05/02/23 Therapeutic Exercise: Supine shoulder flexion AAROM 2x12 cues for comfortable ROM Seated shoulder flex/scaption AROM x8 each cues for comfortable  BTB 3x12 row cues for posture and comfortable ROM  Swiss ball flexion x10 cues for comfortable ROM  HEP update + education  Therapeutic Activity: Significant time spent discussing symptoms, activity modification, progress with PT, PT POC, and follow up with provider    [facility] Adult PT Treatment:                                                DATE: 04/28/23 Therapeutic Exercise: Supine dowel flexion AAROM 2x10 cues for comfortable ROM  Supine R GH flexion AROM 2x8 cues for comfortable ROM BTB 2x12 row cues for UT mechanics GTB shoulder ext 2x12 cues for reduced compensations at elbow YTB LUE ER 3x8 cues for reduced compensations at elbow 2# bicep curl 2x15 cues for pacing   [facility] Adult PT Treatment:                                                DATE: 04/25/23 Therapeutic Exercise: BTB row 2x10 cues for form and posture, reduced UT compensations   GTB shoulder ext 2x10 RTB IR iso walkout 2x5 cues for posture and reduced compensataion  YTB ER iso walkout x5 cues for appropriate tension and truncal ROM  Shoulder flexion walkbacks at counter x10 RTB bicep curls x10 cues for reduced compensations at shoulder    [facility] Adult PT Treatment:                                                DATE: 04/22/23 Therapeutic Exercise: Pulleys flexion AAROM x20 Pulleys scaption AAROM x20  GTB row 2x15 cues for reduced UT compensations  RTB shoulder extensions 2x10 cues for posture  RTB tricep push down 2x12 cues for form and posture  Shoulder flexion walkbacks at counter 2x10 cues for comfortable ROM and pacing Verbal HEP review    Manual Therapy: Seated; STM L latissimus dorsi, middle trap, rhomboid, LS, UT   [facility] Adult PT Treatment:  DATE: 04/15/23 Therapeutic Exercise: Pulley flexion AAROM x20 cues for comfortable ROM  Pulley scaption AAROM x20 cues for comfortable ROM  GTB row 2x15 cues for elbow positioning Tricep pushdown 2x10 LUE only cues for form  Swiss ball flexion at table, standing x20 cues for comfortable ROM HEP update, provided w/ RTB for rows and significant time spent with education re: appropriate performance, pain free movement. Family education w/ daughter re: this topic as well    PATIENT EDUCATION: Education details: rationale for interventions, HEP, importance of activity modification based on symptoms Person educated: Patient Education method: Explanation, Demonstration, Tactile cues, Verbal cues Education comprehension: verbalized understanding, returned demonstration, verbal cues required, tactile cues required, and needs further education     HOME EXERCISE PROGRAM: Access Code: [email or web address] URL: [email or web address] Date: 05/02/2023 Prepared by: Fransisco Hertz  Exercises - Seated Shoulder Flexion Towel Slide at Table Top  - 1 x daily - 7 x weekly - 2 sets  - 10 reps - Gentle Levator Scapulae Stretch  - 1 x daily - 7 x weekly - 1-3 sets - 1-3 reps - 30sec hold - Seated Gentle Upper Trapezius Stretch  - 1 x daily - 7 x weekly - 1-3 sets - 1-3 reps - 30sec hold - Standing Shoulder Row with Anchored Resistance  - 1 x daily - 7 x weekly - 2 sets - 10 reps - Supine Shoulder Flexion Extension AAROM with Dowel  - 1 x daily - 7 x weekly - 2 sets - 10 reps   ASSESSMENT:   CLINICAL IMPRESSION: 05/06/2023 Pt arrives w/o pain, states she followed up with PCP yesterday, is going to try and schedule w/ ortho. Today continues to progress for increased volume with periscapular training and increased time with GH mobility. Today's session shortened due to late check in, pt states she had another appt that ran late. No adverse events, continues to endorse improved symptoms with exercise. Recommend continuing along current POC in order to address relevant deficits and improve functional tolerance. Pt departs today's session in no acute distress, all voiced questions/concerns addressed appropriately from PT perspective.     Eval - Patient is a pleasant 72 y.o. woman who was seen today for physical therapy evaluation and treatment for L shoulder pain s/p mechanical fall with anterior dislocation (reduced) and rotator cuff tear. Incident occurred in January 2024, pt >8 weeks out from initial fall and reduction, denies MD restrictions. Pt endorses pain mostly at night and transiently with movement, today demonstrates active and passive limitations of L GH mobility compared to R, no overt TTP or bony deformities noted. Denies any pain on arrival but does endorse feeling improved after mobility during exam. No concern for red flags on today's exam, reportedly progressing well since initial incident. Unable to prescribe HEP or administer FOTO due to time constraints (late check in) but will plan to administer at next visit as able. No adverse events. Recommend skilled PT to address  aforementioned deficits and maximize functional independence, pt/daughter in agreement with plan as described below. Pt departs today's session in no acute distress, all voiced questions/concerns addressed appropriately from PT perspective.     OBJECTIVE IMPAIRMENTS: decreased activity tolerance, decreased endurance, decreased mobility, decreased ROM, decreased strength, hypomobility, impaired UE functional use, postural dysfunction, and pain.    ACTIVITY LIMITATIONS: carrying, lifting, bathing, dressing, reach over head, and hygiene/grooming   PARTICIPATION LIMITATIONS: meal prep, cleaning, and laundry   PERSONAL FACTORS: Age and 3+ comorbidities: afib, HTN, osteoporosis  are also affecting  patient's functional outcome.    REHAB POTENTIAL: Good   CLINICAL DECISION MAKING: Stable/uncomplicated   EVALUATION COMPLEXITY: Low     GOALS: Goals reviewed with patient? No given time constraints   SHORT TERM GOALS: Target date: 04/08/2023 Pt will demonstrate appropriate understanding and performance of initially prescribed HEP in order to facilitate improved independence with management of symptoms.  Baseline: HEP TBD 04/11/23: Pt endorses good compliance with HEP Goal status: MET   2. Pt will improve greater than or equal to MCID on FOTO in order to demonstrate improved perception of function due to symptoms.            Baseline: FOTO TBD   04/11/23: FOTO/ODI not administered             Goal status: NOT MET   LONG TERM GOALS: Target date: 05/23/2023 (extended 04/25/23)   Pt will meet predicted score on FOTO in order to demonstrate improved perception of function due to symptoms. Baseline: FOTO TBD Goal status: NOT MET   2.  Pt will demonstrate at least 140 degrees of active shoulder elevation in order to demonstrate improved tolerance to functional movement patterns such as reaching overhead and upper body dressing.  Baseline: see ROM chart above 04/25/23: see ROM chart above Goal status:  ONGOING   3.  Pt will demonstrate at least 4/5 shoulder flexion MMT for improved symmetry of UE strength and improved tolerance to functional movements.  Baseline: MMT deferred on eval given proximity to injury 04/25/23: NT Goal status: ONGOING   4. Pt will report ability to perform upper body dressing with less than 2 point increase in pain on NPS in order to indicate improved tolerance to ADLs.            Baseline: 0-6/10 pain with ADLs  04/25/23: 0-3/10            Goal status: MET   5. Pt will demonstrate appropriate performance of final prescribed HEP in order to facilitate improved self-management of symptoms post-discharge.             Baseline: HEP TBD  04/25/23: endorses good compliance with HEP, no issues            Goal status: MET      6. Pt will endorse at least 50% reduction in overall pain levels to facilitate improved tolerance to daily activities.             Baseline: 0-6/10 on NPS  04/25/23: 0-3/10            Goal status: MET    PLAN (updated 04/25/23):   PT FREQUENCY: 2x/week   PT DURATION: 4 weeks   PLANNED INTERVENTIONS: Therapeutic exercises, Therapeutic activity, Neuromuscular re-education, Balance training, Gait training, Patient/Family education, Self Care, Joint mobilization, Aquatic Therapy, Dry Needling, Electrical stimulation, Spinal mobilization, Cryotherapy, Moist heat, Taping, Vasopneumatic device, Manual therapy, and Re-evaluation   PLAN FOR NEXT SESSION: continue to progress GH mobility as able/appropriate, periscapular resistance training as tolerated      Ashley Murrain PT, DPT 05/06/2023 10:25 AM

## 2023-05-05 NOTE — Patient Instructions (Addendum)
[facility]-[phone]    Dislipidemia Dyslipidemia La dislipidemia es un desequilibrio de sustancias cerosas parecidas a la grasa (lpidos) en la sangre. El cuerpo necesita lpidos en pequeas cantidades. Con frecuencia, la dislipidemia implica un nivel alto de colesterol o triglicridos, que son tipos de lpidos. Las formas frecuentes de dislipidemia incluyen las siguientes: Niveles elevados de colesterol LDL. El LDL es el tipo de colesterol que causa la acumulacin de depsitos de grasa (placas) en los vasos sanguneos que transportan la sangre fuera del corazn (arterias). Niveles bajos de colesterol HDL. El HDL es el tipo de colesterol que brinda proteccin contra las enfermedades cardacas. Los niveles altos de HDL eliminan la acumulacin de LDL de las arterias. Niveles altos de triglicridos. Los triglicridos son [place] Dear sustancia grasa presente en la sangre que se relaciona con la acumulacin de placa en las arterias. Cules son las causas? Hay dos tipos principales de dislipidemia: primaria y [place]. La dislipidemia primaria es causada por cambios (mutaciones) en los genes que se transmiten a travs de las familias (se heredan). Estas mutaciones causan varios tipos de dislipidemia. La dislipidemia secundaria puede ser causada por diversos factores de riesgo que pueden provocar la enfermedad, como las opciones de estilo de vida y Theatre manager. Qu incrementa el riesgo? Tiene ms probabilidades de Aeronautical engineer afeccin si es un hombre mayor o si es una mujer que ha pasado por la menopausia. Otros factores de riesgo son los siguientes: Tener antecedentes familiares de dislipidemia. Tomar determinados medicamentos, entre ellos, pldoras anticonceptivas, corticoesteroides, algunos diurticos y betabloqueantes. Seguir una dieta con alto contenido de grasas saturadas. Fumar cigarrillos o beber alcohol en exceso. Tener ciertas afecciones mdicas, como diabetes, sndrome de ovario  poliqustico (SOP), enfermedad renal, enfermedad heptica o hipotiroidismo. No hacer ejercicio regularmente. Tener sobrepeso o ser obeso con demasiada grasa en el abdomen. Cules son los signos o sntomas? En la [employer], la dislipidemia no causa ningn sntoma. En los [place] graves, los niveles muy altos de lpidos pueden causar: Protuberancias de grasa debajo de la piel (xantomas). Un anillo blanco o gris alrededor del centro negro (pupila) del ojo. Los niveles muy altos de triglicridos pueden causar inflamacin del pncreas (pancreatitis). Cmo se diagnostica? Su mdico puede diagnosticar dislipidemia basndose en un anlisis de sangre de rutina (anlisis de sangre en [place]). Como la [employer] de las personas no tienen sntomas de la afeccin, este anlisis de sangre (perfil de lpidos) se realiza en adultos mayores de 20 aos y se repite cada 4 a 6 aos. En este anlisis, se controla lo siguiente: Colesterol total. Esto mide la cantidad total de colesterol en la sangre, que incluye el colesterol LDL, el colesterol HDL y los triglicridos. Un valor saludable est por debajo de 200 mg/dl (1.61 mmol/l). Colesterol LDL. El valor objetivo de colesterol LDL es diferente para cada persona, en funcin de los factores de riesgo individuales. Un valor saludable suele estar por debajo de 100 mg/dl (0.96 mmol/l). Consulte al mdico cul debe ser el valor del colesterol LDL para usted. Colesterol HDL. Un nivel de colesterol HDL de 60 mg/dl (0.45 mmol/l) o superior es lo mejor porque ayuda a Health visitor las enfermedades cardacas. Un valor por debajo de 40 mg/dl (4.09 mmol/l) para los hombres o por debajo de 50 mg/dl (8.11 mmol/l) para las mujeres aumenta el riesgo de enfermedad cardaca. Triglicridos. Un valor de triglicridos saludable est por debajo de 150 mg/dl (9.14 mmol/l). Si su perfil de lpidos es anormal, su mdico puede realizar otros anlisis de [place]. Cmo  se trata? El  tratamiento depende del tipo de dislipidemia que usted tenga y sus otros factores de riesgo de enfermedades cardacas o accidente cerebrovascular. Su mdico tendr un rango objetivo para sus niveles de lpidos en funcin de esta informacin. El tratamiento para la dislipidemia comienza con cambios en el estilo de vida, tales como dieta y ejercicio. El mdico podra recomendarle que haga lo siguiente: Hacer ejercicio con regularidad. Realizar cambios en la dieta. Si fuma, dejar de hacerlo. Limitar el consumo de bebidas alcohlicas. Si los cambios en la dieta y la actividad fsica no ayudan a Barista sus objetivos, el mdico tambin puede recetarle medicamentos para disminuir los lpidos. El tipo de medicamento recetado con ms frecuencia disminuye el colesterol LDL (estatinas). Si tiene Publishing copy de triglicridos, su mdico puede recetarle otro tipo de frmaco (fibratos) o un suplemento de aceite de pescado con omega-3, o ambos. Siga estas instrucciones en su casa: Comida y bebida  Siga las indicaciones del mdico o el nutricionista respecto de las restricciones para las comidas o las bebidas. Siga una dieta saludable como se lo haya indicado el mdico. Esto puede ayudarle a Barista y Pharmacologist un peso saludable, reducir el colesterol LDL y aumentar el colesterol HDL. Puede incluir: Limitar sus caloras, si tiene sobrepeso. Comer ms frutas, verduras, cereales integrales, pescado y carnes magras. Limitar las grasas saturadas, las grasas trans y Print production planner. No beba alcohol si: Su mdico le indica no hacerlo. Est embarazada, puede estar embarazada o est tratando de [place]. Si bebe alcohol: Limite la cantidad que bebe a lo siguiente: De 0 a 1 medida por da para las mujeres. De 0 a 2 medidas por da para los hombres. Sepa cunta cantidad de alcohol hay en las bebidas que toma. En los [address], una medida equivale a una botella de cerveza de 12 oz (355 ml), un vaso de vino  de 5 oz (148 ml) o un vaso de una bebida alcohlica de alta graduacin de 1 oz (44 ml). Actividad Haga ejercicio con regularidad. Siga un programa de ejercicio y entrenamiento de fuerza tal como se lo haya indicado el mdico. Pregntele al mdico qu actividades son seguras para usted. El mdico puede recomendarle lo siguiente: 30 minutos de [place] de 4 a 6 das por [address] La caminata a paso ligero es un ejemplo de [place]. Entrenamiento de fuerza 2 [employer]. Indicaciones generales No consuma ningn producto que contenga nicotina o tabaco. Estos productos incluyen cigarrillos, tabaco para Theatre manager y aparatos de vapeo, como los Administrator, Civil Service. Si necesita ayuda para dejar de consumir estos productos, consulte al [employer]. Use los medicamentos de venta libre y los recetados solamente como se lo haya indicado el mdico. Esto incluye los suplementos. Concurra a todas las visitas de seguimiento. Esto es importante. Comunquese con un mdico si: Tiene dificultad para cumplir con su plan de actividad fsica o su dieta. Le cuesta dejar de fumar o controlar el consumo de alcohol. Resumen Con frecuencia, la dislipidemia implica un nivel alto de colesterol o triglicridos, que son tipos de lpidos. El tratamiento depende del tipo de dislipidemia que usted tenga y sus otros factores de riesgo de enfermedades cardacas o accidente cerebrovascular. El tratamiento para la dislipidemia comienza con cambios en el estilo de vida, tales como dieta y ejercicio. Su mdico puede recetarle medicamentos para disminuir los lpidos. Esta informacin no tiene Theme park manager el consejo del mdico. Asegrese de hacerle al mdico cualquier pregunta que tenga. Document Revised: 02/23/2021 Document  Reviewed: 02/23/2021 Elsevier Patient Education  2023 [employer].
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
## 2023-05-05 NOTE — Progress Notes (Unsigned)
Pain in left arm from fall in January.

## 2023-05-06 ENCOUNTER — Other Ambulatory Visit: Payer: Self-pay | Admitting: Family Medicine

## 2023-05-06 ENCOUNTER — Ambulatory Visit: Payer: Self-pay | Admitting: Physical Therapy

## 2023-05-06 ENCOUNTER — Ambulatory Visit: Payer: Self-pay | Attending: Family Medicine

## 2023-05-06 ENCOUNTER — Encounter: Payer: Self-pay | Admitting: Physical Therapy

## 2023-05-06 DIAGNOSIS — M25512 Pain in left shoulder: Secondary | ICD-10-CM

## 2023-05-06 DIAGNOSIS — M6281 Muscle weakness (generalized): Secondary | ICD-10-CM

## 2023-05-06 DIAGNOSIS — M25612 Stiffness of left shoulder, not elsewhere classified: Secondary | ICD-10-CM

## 2023-05-07 LAB — CMP14+EGFR
ALT: 25 IU/L (ref 0–32)
AST: 30 IU/L (ref 0–40)
Albumin/Globulin Ratio: 1.6 (ref 1.2–2.2)
Albumin: 4.2 g/dL (ref 3.8–4.8)
Alkaline Phosphatase: 86 IU/L (ref 44–121)
BUN/Creatinine Ratio: 34 — ABNORMAL HIGH (ref 12–28)
BUN: 20 mg/dL (ref 8–27)
Bilirubin Total: 0.3 mg/dL (ref 0.0–1.2)
CO2: 19 mmol/L — ABNORMAL LOW (ref 20–29)
Calcium: 9.7 mg/dL (ref 8.7–10.3)
Chloride: 104 mmol/L (ref 96–106)
Creatinine, Ser: 0.58 mg/dL (ref 0.57–1.00)
Globulin, Total: 2.7 g/dL (ref 1.5–4.5)
Glucose: 97 mg/dL (ref 70–99)
Potassium: 4.1 mmol/L (ref 3.5–5.2)
Sodium: 140 mmol/L (ref 134–144)
Total Protein: 6.9 g/dL (ref 6.0–8.5)
eGFR: 95 mL/min/{1.73_m2} (ref >59)

## 2023-05-07 LAB — CBC WITH DIFFERENTIAL/PLATELET
Basophils Absolute: 0 10*3/uL (ref 0.0–0.2)
Basos: 1 %
EOS (ABSOLUTE): 0.2 10*3/uL (ref 0.0–0.4)
Eos: 3 %
Hematocrit: 38.6 % (ref 34.0–46.6)
Hemoglobin: 13 g/dL (ref 11.1–15.9)
Immature Grans (Abs): 0 10*3/uL (ref 0.0–0.1)
Immature Granulocytes: 0 %
Lymphocytes Absolute: 1.4 10*3/uL (ref 0.7–3.1)
Lymphs: 30 %
MCH: 31.5 pg (ref 26.6–33.0)
MCHC: 33.7 g/dL (ref 31.5–35.7)
MCV: 94 fL (ref 79–97)
Monocytes Absolute: 0.4 10*3/uL (ref 0.1–0.9)
Monocytes: 9 %
Neutrophils Absolute: 2.6 10*3/uL (ref 1.4–7.0)
Neutrophils: 57 %
Platelets: 282 10*3/uL (ref 150–450)
RBC: 4.13 x10E6/uL (ref 3.77–5.28)
RDW: 12.4 % (ref 11.7–15.4)
WBC: 4.6 10*3/uL (ref 3.4–10.8)

## 2023-05-07 LAB — LP+NON-HDL CHOLESTEROL
Cholesterol, Total: 186 mg/dL (ref 100–199)
HDL: 71 mg/dL (ref >39)
LDL Chol Calc (NIH): 98 mg/dL (ref 0–99)
Total Non-HDL-Chol (LDL+VLDL): 115 mg/dL (ref 0–129)
Triglycerides: 97 mg/dL (ref 0–149)
VLDL Cholesterol Cal: 17 mg/dL (ref 5–40)

## 2023-05-07 NOTE — Therapy (Signed)
OUTPATIENT PHYSICAL THERAPY TREATMENT NOTE   Patient Name: [name] [name] MRN: [id number] DOB:[birth year], 72 y.o., female Today's Date: 05/08/2023   Progress Note Reporting Period 03/11/2023 to 04/25/2023  PCP: Grayce Sessions, NP  REFERRING PROVIDER: Tarry Kos, MD   END OF SESSION:   PT End of Session - 05/08/23 1636     Visit Number 14    Number of Visits 17    Date for PT Re-Evaluation 05/23/23    Authorization Type none    PT Start Time 1638   late check in   PT Stop Time 1724    PT Time Calculation (min) 46 min    Activity Tolerance Patient tolerated treatment well;No increased pain    Behavior During Therapy [facility] for tasks assessed/performed              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   Hypertension    Osteoarthritis of ankle, right    and Subtalar Joint   Osteoporosis    Presbyopia of both eyes 01/28/2017   Pterygium eye, bilateral 01/28/2017   Past Surgical History:  Procedure Laterality Date   ANKLE FUSION Right 12/24/2017   Tibiocalcaneal fusion   ANKLE FUSION Right 12/24/2017   Procedure: RIGHT TIBIOCALCANEAL FUSION;  Surgeon: Nadara Mustard, MD;  Location: [facility] OR;  Service: Orthopedics;  Laterality: Right;   CHOLECYSTECTOMY     Patient Active Problem List   Diagnosis Date Noted   Atrial fibrillation (HCC) 10/01/2019   Atrial fibrillation with RVR (HCC) 10/01/2019   Osteopenia of multiple sites 02/05/2019   Hyperlipidemia 02/05/2019   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/2017   Post-traumatic osteoarthritis, right ankle and foot    Seasonal allergic rhinitis due to pollen 05/12/2017   Allergy    Cataract 01/28/2017   Presbyopia of both eyes 01/28/2017   Pterygium eye, bilateral 01/28/2017   Astigmatism, bilateral 01/28/2017    Hypermetropia, 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   Acquired bilateral flat feet 09/07/2015   Osteoporosis 09/07/2015   Elevated troponin 01/26/2015   Generalized anxiety disorder 01/26/2015   Hypertension 07/28/2014   Gastritis 07/28/2014   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   OBESITY 05/10/2009   NEPHROLITHIASIS 06/15/2008   Dyslipidemia 03/16/2008   OSTEOARTHRITIS, LUMBAR SPINE 01/09/2008   BUNDLE BRANCH BLOCK, LEFT 12/11/2007   PTSD 10/01/2007   ARM PAIN, LEFT 10/01/2007    REFERRING DIAG:  U20.92XA (ICD-10-CM) - Traumatic closed displaced fracture of left shoulder with anterior dislocation, initial encounter   THERAPY DIAG:  Left shoulder pain, unspecified chronicity  Stiffness of left shoulder, not elsewhere classified  Muscle weakness (generalized)  Rationale for Evaluation and Treatment Rehabilitation  PERTINENT HISTORY: afib, HTN, osteoporosis; fall on 01/12/23  PRECAUTIONS: no precautions in chart review or per discussion w/ pt (>8 weeks out from fall); hx of falls  SUBJECTIVE:  SUBJECTIVE STATEMENT:   05/08/2023 Pt states she continues to feel good with exericse at home. Has been sleeping well. Accompanied by in person interpreter. States she continues to use her arm for more daily activities   PAIN:  Are you having pain: 05/08/2023  none at rest  Location/description: L shoulder, lateral/superior Worst: 3/10 on NPS Per eval: Best-worst over past week: 6/10  - aggravating factors: mostly at night or when moving  - Easing factors: medication, change in positioning, rest   OBJECTIVE: (objective measures completed at initial evaluation unless otherwise dated)   DIAGNOSTIC FINDINGS:  02/08/23 L shoulder MRI:  IMPRESSION: 1. Complete tear of the supraspinatus tendon with 3.5 cm of retraction. 2. Severe  tendinosis of the infraspinatus tendon with a small interstitial tear. 3. Mild tendinosis of the subscapularis tendon. 4. Intra-articular portion of the long head of the biceps tendon is attenuated concerning for a complete tear. 5. Nondisplaced impaction fracture of the superolateral humeral head involving the greater tuberosity with subcortical marrow edema.   PATIENT SURVEYS:  FOTO: deferred on eval given time constraints/language, plan to administer as appropriate    COGNITION: Overall cognitive status: Within functional limits for tasks assessed, pleasant and conversationally appropriate, does endorse some mild memory deficits                                  SENSATION: No neuro complaints - light touch intact throughout B UE   POSTURE: Guarded L UE, mild fwd head and kyphosis    UPPER EXTREMITY ROM:   A/PROM Right eval Left Eval AROM Left 04/01/23 Left 04/08/23 AROM Left A/ROM 04/22/23 L A/PROM 04/25/23 L A/PROM 05/08/23  Shoulder flexion 165 deg  100 deg  112 deg 120 deg painless  115 deg pre MT, 120 deg post MT / 134 deg painless 110 deg pre session  112 post exercise/ 138 deg  129 deg / 145 deg  Shoulder abduction 158 deg 92 deg     100 deg   Shoulder internal rotation (functional)  C7 NT    UT   Shoulder external rotation (functional) T12 NT    L5   Elbow flexion           Elbow extension           Wrist flexion           Wrist extension            (Blank rows = not tested) (Key: WFL = within functional limits not formally assessed, * = concordant pain, s = stiffness/stretching sensation, NT = not tested)  Comments: passive ROM approximately equivalent to AROM - limited assessment by muscle guarding   UPPER EXTREMITY MMT:   MMT Right eval Left eval  Shoulder flexion      Shoulder extension      Shoulder abduction      Shoulder extension      Shoulder internal rotation      Shoulder external rotation      Elbow flexion      Elbow extension      Grip strength       (Blank rows = not tested)  (Key: WFL = within functional limits not formally assessed, * = concordant pain, s = stiffness/stretching sensation, NT = not tested)  Comments: deferred on evaluation   PALPATION:  Notable tightness throughout L UT, LS, deltoid, and lat but pt denies overt tenderness. No bony  tenderness or gross deformities on palpation      TODAY'S TREATMENT:    [facility] Adult PT Treatment:                                                DATE: 05/08/23 Therapeutic Exercise: Supine AAROM w/ dowel x10 cues for ROM Supine AAROM w dowel + 2# ankle weight x8 cues for comfortable ROM GH flexion swiss ball up wall 2x5 cues for comfortable ROM and posture  3# bicep curl 2x10 cues for reduced compensations at shoulder  Shoulder flexion x8 AROM seated cues for comfortable ROM Shoulder flexion walkback x1 at end of session   [facility] Adult PT Treatment:                                                DATE: 05/06/23 Therapeutic Exercise: Blue band row 2x10 cues for form and setup GTB shoulder extension 2x15 cues for posture  Supine shoulder flex AAROM with dowel 2x12 cues for comfortable ROM and appropriate grip width  Pulley flexion AAROM Pulley scaption AAROM  HEP review   [facility] Adult PT Treatment:                                                DATE: 05/02/23 Therapeutic Exercise: Supine shoulder flexion AAROM 2x12 cues for comfortable ROM Seated shoulder flex/scaption AROM x8 each cues for comfortable  BTB 3x12 row cues for posture and comfortable ROM  Swiss ball flexion x10 cues for comfortable ROM  HEP update + education  Therapeutic Activity: Significant time spent discussing symptoms, activity modification, progress with PT, PT POC, and follow up with provider    [facility] Adult PT Treatment:                                                DATE: 04/28/23 Therapeutic Exercise: Supine dowel flexion AAROM 2x10 cues for comfortable ROM  Supine R GH flexion AROM 2x8 cues for  comfortable ROM BTB 2x12 row cues for UT mechanics GTB shoulder ext 2x12 cues for reduced compensations at elbow YTB LUE ER 3x8 cues for reduced compensations at elbow 2# bicep curl 2x15 cues for pacing   [facility] Adult PT Treatment:                                                DATE: 04/25/23 Therapeutic Exercise: BTB row 2x10 cues for form and posture, reduced UT compensations  GTB shoulder ext 2x10 RTB IR iso walkout 2x5 cues for posture and reduced compensataion  YTB ER iso walkout x5 cues for appropriate tension and truncal ROM  Shoulder flexion walkbacks at counter x10 RTB bicep curls x10 cues for reduced compensations at shoulder    [facility] Adult PT Treatment:  DATE: 04/22/23 Therapeutic Exercise: Pulleys flexion AAROM x20 Pulleys scaption AAROM x20  GTB row 2x15 cues for reduced UT compensations  RTB shoulder extensions 2x10 cues for posture  RTB tricep push down 2x12 cues for form and posture  Shoulder flexion walkbacks at counter 2x10 cues for comfortable ROM and pacing Verbal HEP review    Manual Therapy: Seated; STM L latissimus dorsi, middle trap, rhomboid, LS, UT   [facility] Adult PT Treatment:                                                DATE: 04/15/23 Therapeutic Exercise: Pulley flexion AAROM x20 cues for comfortable ROM  Pulley scaption AAROM x20 cues for comfortable ROM  GTB row 2x15 cues for elbow positioning Tricep pushdown 2x10 LUE only cues for form  Swiss ball flexion at table, standing x20 cues for comfortable ROM HEP update, provided w/ RTB for rows and significant time spent with education re: appropriate performance, pain free movement. Family education w/ daughter re: this topic as well    PATIENT EDUCATION: Education details: rationale for interventions, HEP, importance of activity modification based on symptoms Person educated: Patient Education method: Explanation, Demonstration, Tactile cues, Verbal  cues Education comprehension: verbalized understanding, returned demonstration, verbal cues required, tactile cues required, and needs further education     HOME EXERCISE PROGRAM: Access Code: [email or web address] URL: [email or web address] Date: 05/02/2023 Prepared by: Fransisco Hertz  Exercises - Seated Shoulder Flexion Towel Slide at Table Top  - 1 x daily - 7 x weekly - 2 sets - 10 reps - Gentle Levator Scapulae Stretch  - 1 x daily - 7 x weekly - 1-3 sets - 1-3 reps - 30sec hold - Seated Gentle Upper Trapezius Stretch  - 1 x daily - 7 x weekly - 1-3 sets - 1-3 reps - 30sec hold - Standing Shoulder Row with Anchored Resistance  - 1 x daily - 7 x weekly - 2 sets - 10 reps - Supine Shoulder Flexion Extension AAROM with Dowel  - 1 x daily - 7 x weekly - 2 sets - 10 reps   ASSESSMENT:   CLINICAL IMPRESSION: 05/08/2023 Pt arrives w/o pain, states she continues to perform exercises at home without issue. Does endorse fatigue this afternoon from exercising earlier this morning and being busy. Today focusing primarily on increasing volume/ROM with GH mobility towards overhead positions. Continues to report mild discomfort initially with exercises that improves with repetition. Primary report of muscular fatigue. No adverse events. Recommend continuing along current POC in order to address relevant deficits and improve functional tolerance. Pt reportedly follows up with ortho next week - pending their assessment of tissue healing and PT assessment of LTG, considering extension of PT POC for more strengthening/mobility work vs discharge to independent HEP to maintain progress with therapy thus far. Pt departs today's session in no acute distress, all voiced questions/concerns addressed appropriately from PT perspective.    Eval - Patient is a pleasant 72 y.o. woman who was seen today for physical therapy evaluation and treatment for L shoulder pain s/p mechanical fall with anterior dislocation  (reduced) and rotator cuff tear. Incident occurred in January 2024, pt >8 weeks out from initial fall and reduction, denies MD restrictions. Pt endorses pain mostly at night and transiently with movement, today demonstrates active and passive limitations of L GH mobility compared to R,  no overt TTP or bony deformities noted. Denies any pain on arrival but does endorse feeling improved after mobility during exam. No concern for red flags on today's exam, reportedly progressing well since initial incident. Unable to prescribe HEP or administer FOTO due to time constraints (late check in) but will plan to administer at next visit as able. No adverse events. Recommend skilled PT to address aforementioned deficits and maximize functional independence, pt/daughter in agreement with plan as described below. Pt departs today's session in no acute distress, all voiced questions/concerns addressed appropriately from PT perspective.     OBJECTIVE IMPAIRMENTS: decreased activity tolerance, decreased endurance, decreased mobility, decreased ROM, decreased strength, hypomobility, impaired UE functional use, postural dysfunction, and pain.    ACTIVITY LIMITATIONS: carrying, lifting, bathing, dressing, reach over head, and hygiene/grooming   PARTICIPATION LIMITATIONS: meal prep, cleaning, and laundry   PERSONAL FACTORS: Age and 3+ comorbidities: afib, HTN, osteoporosis  are also affecting patient's functional outcome.    REHAB POTENTIAL: Good   CLINICAL DECISION MAKING: Stable/uncomplicated   EVALUATION COMPLEXITY: Low     GOALS: Goals reviewed with patient? No given time constraints   SHORT TERM GOALS: Target date: 04/08/2023 Pt will demonstrate appropriate understanding and performance of initially prescribed HEP in order to facilitate improved independence with management of symptoms.  Baseline: HEP TBD 04/11/23: Pt endorses good compliance with HEP Goal status: MET   2. Pt will improve greater than or  equal to MCID on FOTO in order to demonstrate improved perception of function due to symptoms.            Baseline: FOTO TBD   04/11/23: FOTO/ODI not administered             Goal status: NOT MET   LONG TERM GOALS: Target date: 05/23/2023 (extended 04/25/23)   Pt will meet predicted score on FOTO in order to demonstrate improved perception of function due to symptoms. Baseline: FOTO TBD Goal status: NOT MET   2.  Pt will demonstrate at least 140 degrees of active shoulder elevation in order to demonstrate improved tolerance to functional movement patterns such as reaching overhead and upper body dressing.  Baseline: see ROM chart above 04/25/23: see ROM chart above Goal status: ONGOING   3.  Pt will demonstrate at least 4/5 shoulder flexion MMT for improved symmetry of UE strength and improved tolerance to functional movements.  Baseline: MMT deferred on eval given proximity to injury 04/25/23: NT Goal status: ONGOING   4. Pt will report ability to perform upper body dressing with less than 2 point increase in pain on NPS in order to indicate improved tolerance to ADLs.            Baseline: 0-6/10 pain with ADLs  04/25/23: 0-3/10            Goal status: MET   5. Pt will demonstrate appropriate performance of final prescribed HEP in order to facilitate improved self-management of symptoms post-discharge.             Baseline: HEP TBD  04/25/23: endorses good compliance with HEP, no issues            Goal status: MET      6. Pt will endorse at least 50% reduction in overall pain levels to facilitate improved tolerance to daily activities.             Baseline: 0-6/10 on NPS  04/25/23: 0-3/10            Goal  status: MET    PLAN (updated 04/25/23):   PT FREQUENCY: 2x/week   PT DURATION: 4 weeks   PLANNED INTERVENTIONS: Therapeutic exercises, Therapeutic activity, Neuromuscular re-education, Balance training, Gait training, Patient/Family education, Self Care, Joint mobilization, Aquatic  Therapy, Dry Needling, Electrical stimulation, Spinal mobilization, Cryotherapy, Moist heat, Taping, Vasopneumatic device, Manual therapy, and Re-evaluation   PLAN FOR NEXT SESSION: continue to progress GH mobility as able/appropriate, periscapular resistance training as tolerated    Ashley Murrain PT, DPT 05/08/2023 5:43 PM

## 2023-05-08 ENCOUNTER — Ambulatory Visit: Payer: Self-pay | Admitting: Physical Therapy

## 2023-05-08 ENCOUNTER — Encounter: Payer: Self-pay | Admitting: Physical Therapy

## 2023-05-08 DIAGNOSIS — M25612 Stiffness of left shoulder, not elsewhere classified: Secondary | ICD-10-CM

## 2023-05-08 DIAGNOSIS — M25512 Pain in left shoulder: Secondary | ICD-10-CM

## 2023-05-08 DIAGNOSIS — M6281 Muscle weakness (generalized): Secondary | ICD-10-CM

## 2023-05-13 ENCOUNTER — Ambulatory Visit (INDEPENDENT_AMBULATORY_CARE_PROVIDER_SITE_OTHER): Payer: Self-pay | Admitting: Orthopaedic Surgery

## 2023-05-13 DIAGNOSIS — S4292XA Fracture of left shoulder girdle, part unspecified, initial encounter for closed fracture: Secondary | ICD-10-CM

## 2023-05-13 NOTE — Progress Notes (Signed)
Office Visit Note   Patient: [name] [name]           Date of Birth: [birth year]           MRN: [id number] Visit Date: 05/13/2023              Requested by: Hoy Register, MD [address] [place] [address] [place],  [place] [address] PCP: Hoy Register, MD   Assessment & Plan: Visit Diagnoses:  1. Traumatic closed displaced fracture of left shoulder with anterior dislocation, initial encounter     Plan: I reviewed the PT notes and I am very happy with her progress.  At this point we can add strengthening.  They would like to drop back to physical therapy once a week for financial reasons.  Recheck in 6 weeks.  Interpreter present today which increased complexity of the visit.  All questions answered to their satisfaction.  Follow-Up Instructions: Return in about 6 weeks (around 06/24/2023).   Orders:  No orders of the defined types were placed in this encounter.  No orders of the defined types were placed in this encounter.     Procedures: No procedures performed   Clinical Data: No additional findings.   Subjective: Chief Complaint  Patient presents with   Left Shoulder - Follow-up    HPI Patient returns today for 6-week follow-up status post shoulder dislocation.  She is progressing and making improvements with physical therapy.  Range of motion is improving as well. Review of Systems   Objective: Vital Signs: There were no vitals taken for this visit.  Physical Exam  Ortho Exam Examination left shoulder shows significant improvement in range of motion and strength. Specialty Comments:  No specialty comments available.  Imaging: No results found.   PMFS History: Patient Active Problem List   Diagnosis Date Noted   Atrial fibrillation (HCC) 10/01/2019   Atrial fibrillation with RVR (HCC) 10/01/2019   Osteopenia of multiple sites 02/05/2019   Hyperlipidemia 02/05/2019   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/2017   Post-traumatic osteoarthritis, right  ankle and foot    Seasonal allergic rhinitis due to pollen 05/12/2017   Allergy    Cataract 01/28/2017   Presbyopia of both eyes 01/28/2017   Pterygium eye, bilateral 01/28/2017   Astigmatism, bilateral 01/28/2017   Hypermetropia, bilateral 01/28/2017   Acquired bilateral flat feet 09/07/2015   Osteoporosis 09/07/2015   Elevated troponin 01/26/2015   Generalized anxiety disorder 01/26/2015   Hypertension 07/28/2014   Gastritis 07/28/2014   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   OBESITY 05/10/2009   NEPHROLITHIASIS 06/15/2008   Dyslipidemia 03/16/2008   OSTEOARTHRITIS, 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  Past Surgical History:  Procedure Laterality Date   ANKLE FUSION Right 12/24/2017   Tibiocalcaneal fusion   ANKLE FUSION Right 12/24/2017   Procedure: RIGHT TIBIOCALCANEAL FUSION;  Surgeon: Nadara Mustard, MD;  Location: [facility] OR;  Service: Orthopedics;  Laterality: Right;   CHOLECYSTECTOMY     Social History   Occupational History   Not on file  Tobacco Use   Smoking status: Never   Smokeless tobacco: Never  Vaping Use   Vaping Use: Never used  Substance and Sexual Activity   Alcohol use: No    Alcohol/week: 0.0 standard drinks of alcohol   Drug use: No   Sexual activity: Not Currently

## 2023-05-21 ENCOUNTER — Ambulatory Visit: Payer: Self-pay

## 2023-05-21 DIAGNOSIS — M25512 Pain in left shoulder: Secondary | ICD-10-CM

## 2023-05-21 NOTE — Therapy (Signed)
OUTPATIENT PHYSICAL THERAPY TREATMENT NOTE   Patient Name: [name] [name] MRN: [id number] DOB:27-Oct-1951, 72 y.o., female Today's Date: 05/23/2023   Progress Note Reporting Period 03/11/2023 to 04/25/2023  PCP: Grayce Sessions, NP   REFERRING PROVIDER: Tarry Kos, MD   END OF SESSION:   PT End of Session - 05/22/23 1846     Visit Number 15    Number of Visits 17    Date for PT Re-Evaluation 07/04/23    PT Start Time 1535    PT Stop Time 1615    PT Time Calculation (min) 40 min    Activity Tolerance Patient tolerated treatment well;Patient limited by pain    Behavior During Therapy [facility] for tasks assessed/performed               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/2017   Hypertension    Osteoarthritis of ankle, right    and Subtalar Joint   Osteoporosis    Presbyopia of both eyes 01/28/2017   Pterygium eye, bilateral 01/28/2017   Past Surgical History:  Procedure Laterality Date   ANKLE FUSION Right 12/24/2017   Tibiocalcaneal fusion   ANKLE FUSION Right 12/24/2017   Procedure: RIGHT TIBIOCALCANEAL FUSION;  Surgeon: Nadara Mustard, MD;  Location: [facility];  Service: Orthopedics;  Laterality: Right;   CHOLECYSTECTOMY     Patient Active Problem List   Diagnosis Date Noted   Atrial fibrillation (HCC) 10/01/2019   Atrial fibrillation with RVR (HCC) 10/01/2019   Osteopenia of multiple sites 02/05/2019   Hyperlipidemia 02/05/2019   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/2017   Post-traumatic osteoarthritis, right ankle and foot    Seasonal allergic rhinitis due to pollen 05/12/2017   Allergy    Cataract 01/28/2017   Presbyopia of both eyes 01/28/2017   Pterygium eye, bilateral 01/28/2017   Astigmatism, bilateral 01/28/2017   Hypermetropia, bilateral  01/28/2017   Acquired bilateral flat feet 09/07/2015   Osteoporosis 09/07/2015   Elevated troponin 01/26/2015   Generalized anxiety disorder 01/26/2015   Hypertension 07/28/2014   Gastritis 07/28/2014   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   OBESITY 05/10/2009   NEPHROLITHIASIS 06/15/2008   Dyslipidemia 03/16/2008   OSTEOARTHRITIS, LUMBAR SPINE 01/09/2008   BUNDLE BRANCH BLOCK, LEFT 12/11/2007   PTSD 10/01/2007   ARM PAIN, LEFT 10/01/2007    REFERRING DIAG:  W09.92XA (ICD-10-CM) - Traumatic closed displaced fracture of left shoulder with anterior dislocation, initial encounter   THERAPY DIAG:  Left shoulder pain, unspecified chronicity  Rationale for Evaluation and Treatment Rehabilitation  PERTINENT HISTORY: afib, HTN, osteoporosis; fall on 01/12/23  PRECAUTIONS: no precautions in chart review or per discussion w/ pt (>8 weeks out from fall); hx of falls  SUBJECTIVE:  SUBJECTIVE STATEMENT:   05/21/2023 Pt is accompanied by daughter and in-person interpreter today. Her daughter states that she has seen Ortho and was told that she should begin coming to PT 1x/week and that we may begin more aggressive therapy. She feels that she has been continuing to do well with PT.    PAIN:  Are you having pain: 05/21/2023  none at rest  Location/description: L shoulder, lateral/superior Worst: 3-4/10 on NPS Per eval: Best-worst over past week: 6/10  - aggravating factors: mostly at night or when moving  - Easing factors: medication, change in positioning, rest   OBJECTIVE: (objective measures completed at initial evaluation unless otherwise dated)   DIAGNOSTIC FINDINGS:  02/08/23 L shoulder MRI:  IMPRESSION: 1. Complete tear of the supraspinatus tendon with 3.5 cm of retraction. 2. Severe tendinosis of the  infraspinatus tendon with a small interstitial tear. 3. Mild tendinosis of the subscapularis tendon. 4. Intra-articular portion of the long head of the biceps tendon is attenuated concerning for a complete tear. 5. Nondisplaced impaction fracture of the superolateral humeral head involving the greater tuberosity with subcortical marrow edema.   PATIENT SURVEYS:  FOTO: deferred on eval given time constraints/language, plan to administer as appropriate    COGNITION: Overall cognitive status: Within functional limits for tasks assessed, pleasant and conversationally appropriate, does endorse some mild memory deficits                                  SENSATION: No neuro complaints - light touch intact throughout B UE   POSTURE: Guarded L UE, mild fwd head and kyphosis    UPPER EXTREMITY ROM:   A/PROM Right eval Left Eval AROM Left 04/01/23 Left 04/08/23 AROM Left A/ROM 04/22/23 L A/PROM 04/25/23 L A/PROM 05/08/23  Shoulder flexion 165 deg  100 deg  112 deg 120 deg painless  115 deg pre MT, 120 deg post MT / 134 deg painless 110 deg pre session  112 post exercise/ 138 deg  129 deg / 145 deg  Shoulder abduction 158 deg 92 deg     100 deg 110 deg   Shoulder internal rotation (functional)  C7 NT    UT   Shoulder external rotation (functional) T12 NT    L5   Elbow flexion           Elbow extension           Wrist flexion           Wrist extension            (Blank rows = not tested) (Key: WFL = within functional limits not formally assessed, * = concordant pain, s = stiffness/stretching sensation, NT = not tested)  Comments: passive ROM approximately equivalent to AROM - limited assessment by muscle guarding   UPPER EXTREMITY MMT:   MMT Right eval Left eval Left 05/21/23  Shoulder flexion    3+ 4-  Shoulder extension       Shoulder abduction    3+ 4-  Shoulder extension       Shoulder internal rotation       Shoulder external rotation       Elbow flexion       Elbow extension        Grip strength        (Blank rows = not tested)  (Key: WFL = within functional limits not formally assessed, * = concordant pain, s =  stiffness/stretching sensation, NT = not tested)  Comments: deferred on evaluation   PALPATION:  Notable tightness throughout L UT, LS, deltoid, and lat but pt denies overt tenderness. No bony tenderness or gross deformities on palpation      TODAY'S TREATMENT:    [facility] Adult PT Treatment:                                                DATE: 05/21/23 Therapeutic Exercise: Supine AAROM w dowel + 2# ankle weight x10 cues for comfortable ROM GH flexion swiss ball up wall 2x5 cues for comfortable ROM and posture  4# bicep curl 2x10 cues for reduced compensations at shoulder  Arm bike, 2 x fwd/back, level 1  Standing shoulder flexion with 2# x 10  Standing shoulder abduction with 2# x 10   Therapeutic Activity:  Reviewed and reassessed progression towards established goals including updated objective and subjective assessment   [facility] Adult PT Treatment:                                                DATE: 05/06/23 Therapeutic Exercise: Blue band row 2x10 cues for form and setup GTB shoulder extension 2x15 cues for posture  Supine shoulder flex AAROM with dowel 2x12 cues for comfortable ROM and appropriate grip width  Pulley flexion AAROM Pulley scaption AAROM  HEP review   [facility] Adult PT Treatment:                                                DATE: 05/02/23 Therapeutic Exercise: Supine shoulder flexion AAROM 2x12 cues for comfortable ROM Seated shoulder flex/scaption AROM x8 each cues for comfortable  BTB 3x12 row cues for posture and comfortable ROM  Swiss ball flexion x10 cues for comfortable ROM  HEP update + education  Therapeutic Activity: Significant time spent discussing symptoms, activity modification, progress with PT, PT POC, and follow up with provider    [facility] Adult PT Treatment:                                                 DATE: 04/28/23 Therapeutic Exercise: Supine dowel flexion AAROM 2x10 cues for comfortable ROM  Supine R GH flexion AROM 2x8 cues for comfortable ROM BTB 2x12 row cues for UT mechanics GTB shoulder ext 2x12 cues for reduced compensations at elbow YTB LUE ER 3x8 cues for reduced compensations at elbow 2# bicep curl 2x15 cues for pacing   [facility] Adult PT Treatment:                                                DATE: 04/25/23 Therapeutic Exercise: BTB row 2x10 cues for form and posture, reduced UT compensations  GTB shoulder ext 2x10 RTB IR iso walkout 2x5 cues for posture and reduced compensataion  YTB  ER iso walkout x5 cues for appropriate tension and truncal ROM  Shoulder flexion walkbacks at counter x10 RTB bicep curls x10 cues for reduced compensations at shoulder      PATIENT EDUCATION: Education details: rationale for interventions, HEP, importance of activity modification based on symptoms Person educated: Patient Education method: Explanation, Demonstration, Tactile cues, Verbal cues Education comprehension: verbalized understanding, returned demonstration, verbal cues required, tactile cues required, and needs further education     HOME EXERCISE PROGRAM: Access Code: [email or web address] URL: [email or web address] Date: 05/02/2023 Prepared by: Fransisco Hertz  Exercises - Seated Shoulder Flexion Towel Slide at Table Top  - 1 x daily - 7 x weekly - 2 sets - 10 reps - Gentle Levator Scapulae Stretch  - 1 x daily - 7 x weekly - 1-3 sets - 1-3 reps - 30sec hold - Seated Gentle Upper Trapezius Stretch  - 1 x daily - 7 x weekly - 1-3 sets - 1-3 reps - 30sec hold - Standing Shoulder Row with Anchored Resistance  - 1 x daily - 7 x weekly - 2 sets - 10 reps - Supine Shoulder Flexion Extension AAROM with Dowel  - 1 x daily - 7 x weekly - 2 sets - 10 reps   ASSESSMENT:   CLINICAL IMPRESSION: Patient is demonstrating some improvement with shoulder strength and mobility compared to last  reassessment. However, she continues to be limited by pain with resisted shoulder flexion, resisted shoulder abduction and resistance over 90 degrees of elevation. She will continue to benefit from skilled PT intervention to address strength deficits and improve tolerance of normal daily activities. She has mild pain and muscle fatigue at end of today's session.     OBJECTIVE IMPAIRMENTS: decreased activity tolerance, decreased endurance, decreased mobility, decreased ROM, decreased strength, hypomobility, impaired UE functional use, postural dysfunction, and pain.    ACTIVITY LIMITATIONS: carrying, lifting, bathing, dressing, reach over head, and hygiene/grooming   PARTICIPATION LIMITATIONS: meal prep, cleaning, and laundry   PERSONAL FACTORS: Age and 3+ comorbidities: afib, HTN, osteoporosis  are also affecting patient's functional outcome.    REHAB POTENTIAL: Good   CLINICAL DECISION MAKING: Stable/uncomplicated   EVALUATION COMPLEXITY: Low     GOALS: Goals reviewed with patient? No given time constraints   SHORT TERM GOALS: Target date: 04/08/2023 Pt will demonstrate appropriate understanding and performance of initially prescribed HEP in order to facilitate improved independence with management of symptoms.  Baseline: HEP TBD 04/11/23: Pt endorses good compliance with HEP Goal status: MET   2. Pt will improve greater than or equal to MCID on FOTO in order to demonstrate improved perception of function due to symptoms.            Baseline: FOTO TBD   04/11/23: FOTO/ODI not administered             Goal status: NOT MET   LONG TERM GOALS: Target date: 07/04/2023 (extended 05/22/23)   Pt will meet predicted score on FOTO in order to demonstrate improved perception of function due to symptoms. Baseline: FOTO TBD Goal status: NOT MET   2.  Pt will demonstrate at least 140 degrees of active shoulder elevation in order to demonstrate improved tolerance to functional movement patterns  such as reaching overhead and upper body dressing.  Baseline: see ROM chart above 04/25/23: see ROM chart above Goal status: ONGOING   3.  Pt will demonstrate at least 4/5 shoulder flexion MMT for improved symmetry of UE strength and improved tolerance to functional movements.  Baseline:  MMT deferred on eval given proximity to injury 04/25/23: NT Goal status: ONGOING   4. Pt will report ability to perform upper body dressing with less than 2 point increase in pain on NPS in order to indicate improved tolerance to ADLs.            Baseline: 0-6/10 pain with ADLs  04/25/23: 0-3/10            Goal status: MET   5. Pt will demonstrate appropriate performance of final prescribed HEP in order to facilitate improved self-management of symptoms post-discharge.             Baseline: HEP TBD  04/25/23: endorses good compliance with HEP, no issues            Goal status: MET      6. Pt will endorse at least 50% reduction in overall pain levels to facilitate improved tolerance to daily activities.             Baseline: 0-6/10 on NPS  04/25/23: 0-3/10            Goal status: MET    PLAN (updated 05/21/23):   PT FREQUENCY: 1x/week   PT DURATION: 6 weeks   PLANNED INTERVENTIONS: Therapeutic exercises, Therapeutic activity, Neuromuscular re-education, Balance training, Gait training, Patient/Family education, Self Care, Joint mobilization, Aquatic Therapy, Dry Needling, Electrical stimulation, Spinal mobilization, Cryotherapy, Moist heat, Taping, Vasopneumatic device, Manual therapy, and Re-evaluation   PLAN FOR NEXT SESSION: continue to progress GH mobility as able/appropriate, periscapular resistance training as tolerated, continue with resistance progression as tolerated   Mauri Reading PT, DPT 05/23/2023 9:57 AM

## 2023-05-27 ENCOUNTER — Ambulatory Visit: Payer: Self-pay | Attending: Physician Assistant

## 2023-05-27 DIAGNOSIS — M25612 Stiffness of left shoulder, not elsewhere classified: Secondary | ICD-10-CM | POA: Insufficient documentation

## 2023-05-27 DIAGNOSIS — M6281 Muscle weakness (generalized): Secondary | ICD-10-CM | POA: Insufficient documentation

## 2023-05-27 DIAGNOSIS — M25512 Pain in left shoulder: Secondary | ICD-10-CM | POA: Insufficient documentation

## 2023-05-29 ENCOUNTER — Ambulatory Visit: Payer: Self-pay

## 2023-05-29 DIAGNOSIS — M6281 Muscle weakness (generalized): Secondary | ICD-10-CM

## 2023-05-29 DIAGNOSIS — M25512 Pain in left shoulder: Secondary | ICD-10-CM

## 2023-05-29 DIAGNOSIS — M25612 Stiffness of left shoulder, not elsewhere classified: Secondary | ICD-10-CM

## 2023-05-29 NOTE — Therapy (Signed)
OUTPATIENT PHYSICAL THERAPY TREATMENT NOTE   Patient Name: [name] [name] MRN: [id number] DOB:[birth year], 72 y.o., female Today's Date: 05/29/2023   Progress Note Reporting Period 03/11/2023 to 04/25/2023  PCP: Grayce Sessions, NP   REFERRING PROVIDER: Tarry Kos, MD   END OF SESSION:       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/2017   Hypertension    Osteoarthritis of ankle, right    and Subtalar Joint   Osteoporosis    Presbyopia of both eyes 01/28/2017   Pterygium eye, bilateral 01/28/2017   Past Surgical History:  Procedure Laterality Date   ANKLE FUSION Right 12/24/2017   Tibiocalcaneal fusion   ANKLE FUSION Right 12/24/2017   Procedure: RIGHT TIBIOCALCANEAL FUSION;  Surgeon: Nadara Mustard, MD;  Location: [facility];  Service: Orthopedics;  Laterality: Right;   CHOLECYSTECTOMY     Patient Active Problem List   Diagnosis Date Noted   Atrial fibrillation (HCC) 10/01/2019   Atrial fibrillation with RVR (HCC) 10/01/2019   Osteopenia of multiple sites 02/05/2019   Hyperlipidemia 02/05/2019   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/2017   Post-traumatic osteoarthritis, right ankle and foot    Seasonal allergic rhinitis due to pollen 05/12/2017   Allergy    Cataract 01/28/2017   Presbyopia of both eyes 01/28/2017   Pterygium eye, bilateral 01/28/2017   Astigmatism, bilateral 01/28/2017   Hypermetropia, bilateral 01/28/2017   Acquired bilateral flat feet 09/07/2015   Osteoporosis 09/07/2015   Elevated troponin 01/26/2015   Generalized anxiety disorder 01/26/2015   Hypertension 07/28/2014   Gastritis 07/28/2014   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  Rationale for Evaluation and Treatment Rehabilitation  PERTINENT HISTORY: afib, HTN, osteoporosis; fall on 01/12/23  PRECAUTIONS: no precautions in chart review or per discussion w/ pt (>8 weeks out from fall); hx of falls  SUBJECTIVE:  SUBJECTIVE STATEMENT:   05/29/23: Patient reports that she is feeling a bit better today. She is accompanied by her daughter and in-person interpreter throughout today's session.   05/21/2023 Pt is accompanied by daughter and in-person interpreter today. Her daughter states that she has seen Ortho and was told that she should begin coming to PT 1x/week and that we may begin more aggressive therapy. She feels that she has been continuing to do well with PT.    PAIN:  Are you having pain: 05/21/2023  none at rest  Location/description: L shoulder, lateral/superior Worst: 3-4/10 on NPS Per eval: Best-worst over past week: 6/10  - aggravating factors: mostly at night or when moving  - Easing factors: medication, change in positioning, rest   OBJECTIVE: (objective measures completed at initial evaluation unless otherwise dated)   DIAGNOSTIC FINDINGS:  02/08/23 L shoulder MRI:  IMPRESSION: 1. Complete tear of the supraspinatus tendon with 3.5 cm of retraction. 2. Severe tendinosis of the infraspinatus tendon with a small interstitial tear. 3. Mild tendinosis of the subscapularis tendon. 4. Intra-articular portion of the long head of the biceps tendon is attenuated concerning for a complete tear. 5. Nondisplaced  impaction fracture of the superolateral humeral head involving the greater tuberosity with subcortical marrow edema.   PATIENT SURVEYS:  FOTO: deferred on eval given time constraints/language, plan to administer as appropriate    COGNITION: Overall cognitive status: Within functional limits for tasks assessed, pleasant and conversationally appropriate, does endorse some mild memory deficits                                  SENSATION: No neuro complaints - light touch intact throughout B UE   POSTURE: Guarded L UE, mild fwd head and kyphosis    UPPER EXTREMITY ROM:   A/PROM Right eval Left Eval AROM Left 04/01/23 Left 04/08/23 AROM Left A/ROM 04/22/23 L A/PROM 04/25/23 L A/PROM 05/08/23  Shoulder flexion 165 deg  100 deg  112 deg 120 deg painless  115 deg pre MT, 120 deg post MT / 134 deg painless 110 deg pre session  112 post exercise/ 138 deg  129 deg / 145 deg  Shoulder abduction 158 deg 92 deg     100 deg 110 deg   Shoulder internal rotation (functional)  C7 NT    UT   Shoulder external rotation (functional) T12 NT    L5   Elbow flexion           Elbow extension           Wrist flexion           Wrist extension            (Blank rows = not tested) (Key: WFL = within functional limits not formally assessed, * = concordant pain, s = stiffness/stretching sensation, NT = not tested)  Comments: passive ROM approximately equivalent to AROM - limited assessment by muscle guarding   UPPER EXTREMITY MMT:   MMT Right eval Left eval Left 05/21/23  Shoulder flexion    3+ 4-  Shoulder extension       Shoulder abduction    3+ 4-  Shoulder extension       Shoulder internal rotation       Shoulder external rotation       Elbow flexion       Elbow extension       Grip strength        (  Blank rows = not tested)  (Key: WFL = within functional limits not formally assessed, * = concordant pain, s = stiffness/stretching sensation, NT = not tested)  Comments: deferred on evaluation    PALPATION:  Notable tightness throughout L UT, LS, deltoid, and lat but pt denies overt tenderness. No bony tenderness or gross deformities on palpation      TODAY'S TREATMENT:      [facility] Adult PT Treatment:                                                DATE: 05/29/2023  Therapeutic Exercise: Warm up - pulley flexion/scaption x 2' each  Shoulder rolls fwd/back x 10 each  Standing shoulder flexion with yellow resistance band x 15 with frequent cueing for elbow extension  Standing shoulder abduction with resistance band GTB shoulder extension 2x15 cues for posture  Isometric ER/IR walkouts, x 10 each side with red theraband with frequent cueing  Resisted rows, RTB, 2 x 10  Shoulder depression into physioball - seated, 5 sec hold x 10    [facility] Adult PT Treatment:                                                DATE: 05/21/23 Therapeutic Exercise: Supine AAROM w dowel + 2# ankle weight x10 cues for comfortable ROM GH flexion swiss ball up wall 2x5 cues for comfortable ROM and posture  4# bicep curl 2x10 cues for reduced compensations at shoulder  Arm bike, 2 x fwd/back, level 1  Standing shoulder flexion with 2# x 10  Standing shoulder abduction with 2# x 10   Therapeutic Activity:  Reviewed and reassessed progression towards established goals including updated objective and subjective assessment   [facility] Adult PT Treatment:                                                DATE: 05/06/23 Therapeutic Exercise: Blue band row 2x10 cues for form and setup GTB shoulder extension 2x15 cues for posture  Supine shoulder flex AAROM with dowel 2x12 cues for comfortable ROM and appropriate grip width  Pulley flexion AAROM Pulley scaption AAROM  HEP review   [facility] Adult PT Treatment:                                                DATE: 05/02/23 Therapeutic Exercise: Supine shoulder flexion AAROM 2x12 cues for comfortable ROM Seated shoulder flex/scaption AROM x8 each cues for comfortable   BTB 3x12 row cues for posture and comfortable ROM  Swiss ball flexion x10 cues for comfortable ROM  HEP update + education  Therapeutic Activity: Significant time spent discussing symptoms, activity modification, progress with PT, PT POC, and follow up with provider        PATIENT EDUCATION: Education details: rationale for interventions, HEP, importance of activity modification based on symptoms Person educated: Patient Education method: Explanation, Demonstration, Tactile cues, Verbal cues Education comprehension: verbalized understanding,  returned demonstration, verbal cues required, tactile cues required, and needs further education     HOME EXERCISE PROGRAM: Access Code: [email or web address] URL: [email or web address] Date: 05/02/2023 Prepared by: Fransisco Hertz  Exercises - Seated Shoulder Flexion Towel Slide at Table Top  - 1 x daily - 7 x weekly - 2 sets - 10 reps - Gentle Levator Scapulae Stretch  - 1 x daily - 7 x weekly - 1-3 sets - 1-3 reps - 30sec hold - Seated Gentle Upper Trapezius Stretch  - 1 x daily - 7 x weekly - 1-3 sets - 1-3 reps - 30sec hold - Standing Shoulder Row with Anchored Resistance  - 1 x daily - 7 x weekly - 2 sets - 10 reps - Supine Shoulder Flexion Extension AAROM with Dowel  - 1 x daily - 7 x weekly - 2 sets - 10 reps   ASSESSMENT:   CLINICAL IMPRESSION: Tala had improved tolerance of today's treatment session. She did require frequent cueing with isometric ER/IR walkouts and resisted shoulder flexion. She reports muscle fatigue at end of session, but denies increased pain. We will continue to progress towards established goals as able.      OBJECTIVE IMPAIRMENTS: decreased activity tolerance, decreased endurance, decreased mobility, decreased ROM, decreased strength, hypomobility, impaired UE functional use, postural dysfunction, and pain.    ACTIVITY LIMITATIONS: carrying, lifting, bathing, dressing, reach over head, and  hygiene/grooming   PARTICIPATION LIMITATIONS: meal prep, cleaning, and laundry   PERSONAL FACTORS: Age and 3+ comorbidities: afib, HTN, osteoporosis  are also affecting patient's functional outcome.    REHAB POTENTIAL: Good   CLINICAL DECISION MAKING: Stable/uncomplicated   EVALUATION COMPLEXITY: Low     GOALS: Goals reviewed with patient? No given time constraints   SHORT TERM GOALS: Target date: 04/08/2023 Pt will demonstrate appropriate understanding and performance of initially prescribed HEP in order to facilitate improved independence with management of symptoms.  Baseline: HEP TBD 04/11/23: Pt endorses good compliance with HEP Goal status: MET   2. Pt will improve greater than or equal to MCID on FOTO in order to demonstrate improved perception of function due to symptoms.            Baseline: FOTO TBD   04/11/23: FOTO/ODI not administered             Goal status: NOT MET   LONG TERM GOALS: Target date: 07/04/2023 (extended 05/22/23)   Pt will meet predicted score on FOTO in order to demonstrate improved perception of function due to symptoms. Baseline: FOTO TBD Goal status: NOT MET   2.  Pt will demonstrate at least 140 degrees of active shoulder elevation in order to demonstrate improved tolerance to functional movement patterns such as reaching overhead and upper body dressing.  Baseline: see ROM chart above 04/25/23: see ROM chart above Goal status: ONGOING   3.  Pt will demonstrate at least 4/5 shoulder flexion MMT for improved symmetry of UE strength and improved tolerance to functional movements.  Baseline: MMT deferred on eval given proximity to injury 04/25/23: NT Goal status: ONGOING   4. Pt will report ability to perform upper body dressing with less than 2 point increase in pain on NPS in order to indicate improved tolerance to ADLs.            Baseline: 0-6/10 pain with ADLs  04/25/23: 0-3/10            Goal status: MET   5. Pt will demonstrate appropriate  performance of final prescribed  HEP in order to facilitate improved self-management of symptoms post-discharge.             Baseline: HEP TBD  04/25/23: endorses good compliance with HEP, no issues            Goal status: MET      6. Pt will endorse at least 50% reduction in overall pain levels to facilitate improved tolerance to daily activities.             Baseline: 0-6/10 on NPS  04/25/23: 0-3/10            Goal status: MET    PLAN (updated 05/21/23):   PT FREQUENCY: 1x/week   PT DURATION: 6 weeks   PLANNED INTERVENTIONS: Therapeutic exercises, Therapeutic activity, Neuromuscular re-education, Balance training, Gait training, Patient/Family education, Self Care, Joint mobilization, Aquatic Therapy, Dry Needling, Electrical stimulation, Spinal mobilization, Cryotherapy, Moist heat, Taping, Vasopneumatic device, Manual therapy, and Re-evaluation   PLAN FOR NEXT SESSION: continue to progress GH mobility as able/appropriate, periscapular resistance training as tolerated, continue with resistance progression as tolerated   Mauri Reading PT, DPT 05/29/2023 6:33 PM

## 2023-06-02 ENCOUNTER — Ambulatory Visit: Payer: Self-pay | Attending: Family Medicine

## 2023-06-02 DIAGNOSIS — Z23 Encounter for immunization: Secondary | ICD-10-CM | POA: Insufficient documentation

## 2023-06-02 NOTE — Progress Notes (Signed)
Hepatitis B recombinant vaccine administered LEFT DELTOID per protocols.  Information sheet given. Patient denies and pain or discomfort at injection site. Tolerated injection well no reaction.

## 2023-06-05 ENCOUNTER — Ambulatory Visit: Payer: Self-pay

## 2023-06-05 DIAGNOSIS — M6281 Muscle weakness (generalized): Secondary | ICD-10-CM

## 2023-06-05 DIAGNOSIS — M25512 Pain in left shoulder: Secondary | ICD-10-CM

## 2023-06-05 DIAGNOSIS — M25612 Stiffness of left shoulder, not elsewhere classified: Secondary | ICD-10-CM

## 2023-06-05 NOTE — Therapy (Signed)
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   Hypertension    Osteoarthritis of ankle, right    and Subtalar Joint   Osteoporosis    Presbyopia of both eyes 01/28/2017   Pterygium eye, bilateral 01/28/2017   Past Surgical History:  Procedure Laterality Date   ANKLE FUSION Right 12/24/2017   Tibiocalcaneal fusion   ANKLE FUSION Right 12/24/2017   Procedure: RIGHT TIBIOCALCANEAL FUSION;  Surgeon: Nadara Mustard, MD;  Location: [facility] OR;  Service: Orthopedics;  Laterality: Right;   CHOLECYSTECTOMY     Patient Active Problem List   Diagnosis Date Noted   Atrial fibrillation (HCC) 10/01/2019   Atrial fibrillation with RVR (HCC) 10/01/2019   Osteopenia of multiple sites 02/05/2019   Hyperlipidemia 02/05/2019   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/2017   Post-traumatic osteoarthritis, right ankle and foot    Seasonal allergic rhinitis due to pollen 05/12/2017   Allergy    Cataract 01/28/2017   Presbyopia of both eyes 01/28/2017   Pterygium eye, bilateral 01/28/2017   Astigmatism, bilateral 01/28/2017   Hypermetropia, bilateral 01/28/2017   Acquired bilateral flat feet 09/07/2015   Osteoporosis 09/07/2015   Elevated troponin 01/26/2015   Generalized anxiety disorder 01/26/2015   Hypertension 07/28/2014   Gastritis 07/28/2014   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   OBESITY 05/10/2009   NEPHROLITHIASIS 06/15/2008   Dyslipidemia 03/16/2008   OSTEOARTHRITIS, LUMBAR SPINE 01/09/2008    BUNDLE BRANCH BLOCK, LEFT 12/11/2007   PTSD 10/01/2007   ARM PAIN, LEFT 10/01/2007    REFERRING DIAG:  H84.92XA (ICD-10-CM) - Traumatic closed displaced fracture of left shoulder with anterior dislocation, initial encounter   THERAPY DIAG:  No diagnosis found.  Rationale for Evaluation and Treatment Rehabilitation  PERTINENT HISTORY: afib, HTN, osteoporosis; fall on 01/12/23  PRECAUTIONS: no precautions in chart review or per discussion w/ pt (>8 weeks out from fall); hx of falls  SUBJECTIVE:  SUBJECTIVE STATEMENT:   Patient reporting no pain at start of session.   She is accompanied by her daughter and in-person interpreter throughout today's session.     PAIN:  Are you having pain: 05/21/2023  none at rest  Location/description: L shoulder, lateral/superior Worst: 3-4/10 on NPS Per eval: Best-worst over past week: 6/10  - aggravating factors: mostly at night or when moving  - Easing factors: medication, change in positioning, rest   OBJECTIVE: (objective measures completed at initial evaluation unless otherwise dated)   DIAGNOSTIC FINDINGS:  02/08/23 L shoulder MRI:  IMPRESSION: 1. Complete tear of the supraspinatus tendon with 3.5 cm of retraction. 2. Severe tendinosis of the infraspinatus tendon with a small interstitial tear. 3. Mild tendinosis of the subscapularis tendon. 4. Intra-articular portion of the long head of the biceps tendon is attenuated concerning for a complete tear. 5. Nondisplaced impaction fracture of the superolateral humeral head involving the greater tuberosity with subcortical marrow edema.   PATIENT SURVEYS:  FOTO: deferred on eval given time constraints/language, plan to administer as appropriate    COGNITION: Overall cognitive status: Within functional limits  for tasks assessed, pleasant and conversationally appropriate, does endorse some mild memory deficits                                  SENSATION: No neuro complaints - light touch intact throughout B UE   POSTURE: Guarded L UE, mild fwd head and kyphosis    UPPER EXTREMITY ROM:   A/PROM Right eval Left Eval AROM Left 04/01/23 Left 04/08/23 AROM Left A/ROM 04/22/23 L A/PROM 04/25/23 L A/PROM 05/08/23  Shoulder flexion 165 deg  100 deg  112 deg 120 deg painless  115 deg pre MT, 120 deg post MT / 134 deg painless 110 deg pre session  112 post exercise/ 138 deg  129 deg / 145 deg  Shoulder abduction 158 deg 92 deg     100 deg 110 deg   Shoulder internal rotation (functional)  C7 NT    UT   Shoulder external rotation (functional) T12 NT    L5   Elbow flexion           Elbow extension           Wrist flexion           Wrist extension            (Blank rows = not tested) (Key: WFL = within functional limits not formally assessed, * = concordant pain, s = stiffness/stretching sensation, NT = not tested)  Comments: passive ROM approximately equivalent to AROM - limited assessment by muscle guarding   UPPER EXTREMITY MMT:   MMT Right eval Left eval Left 05/21/23  Shoulder flexion    3+ 4-  Shoulder extension       Shoulder abduction    3+ 4-  Shoulder extension       Shoulder internal rotation       Shoulder external rotation       Elbow flexion       Elbow extension       Grip strength        (Blank rows = not tested)  (Key: WFL = within functional limits not formally assessed, * = concordant pain, s = stiffness/stretching sensation, NT = not tested)  Comments: deferred on evaluation   PALPATION:  Notable tightness throughout L UT, LS, deltoid, and lat but pt  denies overt tenderness. No bony tenderness or gross deformities on palpation        TODAY'S TREATMENT:     [facility] Adult PT Treatment:                                                DATE: 06/05/2023  Therapeutic  Exercise: Warm up - pulley flexion/scaption x 2' each  Shoulder rolls fwd/back x 15 each  Seated L D2 flexion AROM x 15  Standing shoulder abduction with red resistance band, 2 x 10 GTB shoulder extension 2x15 cues for posture  Isometric ER/IR walkouts, x 10 each side with yellow theraband with frequent cueing, towel in axilla Resisted rows, RTB, 2 x 10  Shoulder depression into physioball - seated, 5 sec hold x 20     [facility] Adult PT Treatment:                                                DATE: 05/29/2023  Therapeutic Exercise: Warm up - pulley flexion/scaption x 2' each  Shoulder rolls fwd/back x 10 each  Standing shoulder flexion with yellow resistance band x 15 with frequent cueing for elbow extension  Standing shoulder abduction with resistance band GTB shoulder extension 2x15 cues for posture  Isometric ER/IR walkouts, x 10 each side with red theraband with frequent cueing  Resisted rows, RTB, 2 x 10  Shoulder depression into physioball - seated, 5 sec hold x 10    [facility] Adult PT Treatment:                                                DATE: 05/21/23 Therapeutic Exercise: Supine AAROM w dowel + 2# ankle weight x10 cues for comfortable ROM GH flexion swiss ball up wall 2x5 cues for comfortable ROM and posture  4# bicep curl 2x10 cues for reduced compensations at shoulder  Arm bike, 2 x fwd/back, level 1  Standing shoulder flexion with 2# x 10  Standing shoulder abduction with 2# x 10   Therapeutic Activity:  Reviewed and reassessed progression towards established goals including updated objective and subjective assessment      PATIENT EDUCATION: Education details: rationale for interventions, HEP, importance of activity modification based on symptoms Person educated: Patient Education method: Explanation, Demonstration, Tactile cues, Verbal cues Education comprehension: verbalized understanding, returned demonstration, verbal cues required, tactile cues required, and  needs further education     HOME EXERCISE PROGRAM: Access Code: [email or web address] URL: [email or web address] Date: 05/02/2023 Prepared by: Fransisco Hertz  Exercises - Seated Shoulder Flexion Towel Slide at Table Top  - 1 x daily - 7 x weekly - 2 sets - 10 reps - Gentle Levator Scapulae Stretch  - 1 x daily - 7 x weekly - 1-3 sets - 1-3 reps - 30sec hold - Seated Gentle Upper Trapezius Stretch  - 1 x daily - 7 x weekly - 1-3 sets - 1-3 reps - 30sec hold - Standing Shoulder Row with Anchored Resistance  - 1 x daily - 7 x weekly - 2 sets - 10 reps - Supine Shoulder Flexion Extension AAROM with  Dowel  - 1 x daily - 7 x weekly - 2 sets - 10 reps   ASSESSMENT:   CLINICAL IMPRESSION: Permelia demonstrated significantly improved mechanics with all exercises today. She continues to benefit from visual, verbal, and tactile cueing with IR/ER walkouts and D2 shoulder flexion. We will continue with progression towards established goals as able.     OBJECTIVE IMPAIRMENTS: decreased activity tolerance, decreased endurance, decreased mobility, decreased ROM, decreased strength, hypomobility, impaired UE functional use, postural dysfunction, and pain.    ACTIVITY LIMITATIONS: carrying, lifting, bathing, dressing, reach over head, and hygiene/grooming   PARTICIPATION LIMITATIONS: meal prep, cleaning, and laundry   PERSONAL FACTORS: Age and 3+ comorbidities: afib, HTN, osteoporosis  are also affecting patient's functional outcome.    REHAB POTENTIAL: Good   CLINICAL DECISION MAKING: Stable/uncomplicated   EVALUATION COMPLEXITY: Low     GOALS: Goals reviewed with patient? No given time constraints   SHORT TERM GOALS: Target date: 04/08/2023 Pt will demonstrate appropriate understanding and performance of initially prescribed HEP in order to facilitate improved independence with management of symptoms.  Baseline: HEP TBD 04/11/23: Pt endorses good compliance with HEP Goal status: MET   2. Pt  will improve greater than or equal to MCID on FOTO in order to demonstrate improved perception of function due to symptoms.            Baseline: FOTO TBD   04/11/23: FOTO/ODI not administered             Goal status: NOT MET   LONG TERM GOALS: Target date: 07/04/2023 (extended 05/22/23)   Pt will meet predicted score on FOTO in order to demonstrate improved perception of function due to symptoms. Baseline: FOTO TBD Goal status: NOT MET   2.  Pt will demonstrate at least 140 degrees of active shoulder elevation in order to demonstrate improved tolerance to functional movement patterns such as reaching overhead and upper body dressing.  Baseline: see ROM chart above 04/25/23: see ROM chart above Goal status: ONGOING   3.  Pt will demonstrate at least 4/5 shoulder flexion MMT for improved symmetry of UE strength and improved tolerance to functional movements.  Baseline: MMT deferred on eval given proximity to injury 04/25/23: NT Goal status: ONGOING   4. Pt will report ability to perform upper body dressing with less than 2 point increase in pain on NPS in order to indicate improved tolerance to ADLs.            Baseline: 0-6/10 pain with ADLs  04/25/23: 0-3/10            Goal status: MET   5. Pt will demonstrate appropriate performance of final prescribed HEP in order to facilitate improved self-management of symptoms post-discharge.             Baseline: HEP TBD  04/25/23: endorses good compliance with HEP, no issues            Goal status: MET      6. Pt will endorse at least 50% reduction in overall pain levels to facilitate improved tolerance to daily activities.             Baseline: 0-6/10 on NPS  04/25/23: 0-3/10            Goal status: MET    PLAN (updated 05/21/23):   PT FREQUENCY: 1x/week   PT DURATION: 6 weeks   PLANNED INTERVENTIONS: Therapeutic exercises, Therapeutic activity, Neuromuscular re-education, Balance training, Gait training, Patient/Family education, Self Care,  Joint mobilization, Aquatic Therapy, Dry Needling, Electrical stimulation, Spinal mobilization, Cryotherapy, Moist heat, Taping, Vasopneumatic device, Manual therapy, and Re-evaluation   PLAN FOR NEXT SESSION: continue to progress GH mobility as able/appropriate, periscapular resistance training as tolerated, continue with resistance progression as tolerated   Mauri Reading PT, DPT 06/05/2023 1:25 PM

## 2023-06-12 ENCOUNTER — Ambulatory Visit: Payer: Self-pay

## 2023-06-12 DIAGNOSIS — M25512 Pain in left shoulder: Secondary | ICD-10-CM

## 2023-06-12 DIAGNOSIS — M25612 Stiffness of left shoulder, not elsewhere classified: Secondary | ICD-10-CM

## 2023-06-12 DIAGNOSIS — M6281 Muscle weakness (generalized): Secondary | ICD-10-CM

## 2023-06-12 NOTE — Therapy (Signed)
OUTPATIENT PHYSICAL THERAPY TREATMENT NOTE   Patient Name: [name] [name] MRN: [id number] DOB:Sep 24, 1951, 72 y.o., female Today's Date: 06/12/2023   Progress Note Reporting Period 03/11/2023 to 04/25/2023  PCP: Grayce Sessions, NP   REFERRING PROVIDER: Tarry Kos, MD   END OF SESSION:   PT End of Session - 06/12/23 1658     Visit Number 18    Date for PT Re-Evaluation 07/04/23    Authorization Type none    PT Start Time 1620    PT Stop Time 1700    PT Time Calculation (min) 40 min    Activity Tolerance Patient tolerated treatment well    Behavior During Therapy [facility] for tasks assessed/performed                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/2017   Hypertension    Osteoarthritis of ankle, right    and Subtalar Joint   Osteoporosis    Presbyopia of both eyes 01/28/2017   Pterygium eye, bilateral 01/28/2017   Past Surgical History:  Procedure Laterality Date   ANKLE FUSION Right 12/24/2017   Tibiocalcaneal fusion   ANKLE FUSION Right 12/24/2017   Procedure: RIGHT TIBIOCALCANEAL FUSION;  Surgeon: Nadara Mustard, MD;  Location: [facility];  Service: Orthopedics;  Laterality: Right;   CHOLECYSTECTOMY     Patient Active Problem List   Diagnosis Date Noted   Atrial fibrillation (HCC) 10/01/2019   Atrial fibrillation with RVR (HCC) 10/01/2019   Osteopenia of multiple sites 02/05/2019   Hyperlipidemia 02/05/2019   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/2017   Post-traumatic osteoarthritis, right ankle and foot    Seasonal allergic rhinitis due to pollen 05/12/2017   Allergy    Cataract 01/28/2017   Presbyopia of both eyes 01/28/2017   Pterygium eye, bilateral 01/28/2017   Astigmatism, bilateral 01/28/2017   Hypermetropia, bilateral 01/28/2017   Acquired  bilateral flat feet 09/07/2015   Osteoporosis 09/07/2015   Elevated troponin 01/26/2015   Generalized anxiety disorder 01/26/2015   Hypertension 07/28/2014   Gastritis 07/28/2014   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   OBESITY 05/10/2009   NEPHROLITHIASIS 06/15/2008   Dyslipidemia 03/16/2008   OSTEOARTHRITIS, LUMBAR SPINE 01/09/2008   BUNDLE BRANCH BLOCK, LEFT 12/11/2007   PTSD 10/01/2007   ARM PAIN, LEFT 10/01/2007    REFERRING DIAG:  W09.92XA (ICD-10-CM) - Traumatic closed displaced fracture of left shoulder with anterior dislocation, initial encounter   THERAPY DIAG:  Left shoulder pain, unspecified chronicity  Stiffness of left shoulder, not elsewhere classified  Muscle weakness (generalized)  Rationale for Evaluation and Treatment Rehabilitation  PERTINENT HISTORY: afib, HTN, osteoporosis; fall on 01/12/23  PRECAUTIONS: no precautions in chart review or per discussion w/ pt (>8 weeks out from fall); hx of falls  SUBJECTIVE:  SUBJECTIVE STATEMENT:   Patient reporting that she definitely feels that she is getting benefit from PT, she has been performing her HEP every morning and her husband helps her to work on AAROM/PROM activities at home. She is continuing to notice limitations with grooming tasks d/t mobility limitations.   She is accompanied by her daughter and in-person interpreter throughout today's session.     PAIN:  Are you having pain: 05/21/2023  none at rest  Location/description: L shoulder, lateral/superior Worst: 3-4/10 on NPS Per eval: Best-worst over past week: 6/10  - aggravating factors: mostly at night or when moving  - Easing factors: medication, change in positioning, rest   OBJECTIVE: (objective measures completed at initial evaluation unless otherwise  dated)   DIAGNOSTIC FINDINGS:  02/08/23 L shoulder MRI:  IMPRESSION: 1. Complete tear of the supraspinatus tendon with 3.5 cm of retraction. 2. Severe tendinosis of the infraspinatus tendon with a small interstitial tear. 3. Mild tendinosis of the subscapularis tendon. 4. Intra-articular portion of the long head of the biceps tendon is attenuated concerning for a complete tear. 5. Nondisplaced impaction fracture of the superolateral humeral head involving the greater tuberosity with subcortical marrow edema.   PATIENT SURVEYS:  FOTO: deferred on eval given time constraints/language, plan to administer as appropriate    COGNITION: Overall cognitive status: Within functional limits for tasks assessed, pleasant and conversationally appropriate, does endorse some mild memory deficits                                  SENSATION: No neuro complaints - light touch intact throughout B UE   POSTURE: Guarded L UE, mild fwd head and kyphosis    UPPER EXTREMITY ROM:   A/PROM Right eval Left Eval AROM Left 04/01/23 Left 04/08/23 AROM Left A/ROM 04/22/23 L A/PROM 04/25/23 L A/PROM 05/08/23  Shoulder flexion 165 deg  100 deg  112 deg 120 deg painless  115 deg pre MT, 120 deg post MT / 134 deg painless 110 deg pre session  112 post exercise/ 138 deg  129 deg / 145 deg  Shoulder abduction 158 deg 92 deg     100 deg 110 deg   Shoulder internal rotation (functional)  C7 NT    UT   Shoulder external rotation (functional) T12 NT    L5   Elbow flexion           Elbow extension           Wrist flexion           Wrist extension            (Blank rows = not tested) (Key: WFL = within functional limits not formally assessed, * = concordant pain, s = stiffness/stretching sensation, NT = not tested)  Comments: passive ROM approximately equivalent to AROM - limited assessment by muscle guarding   UPPER EXTREMITY MMT:   MMT Right eval Left eval Left 05/21/23  Shoulder flexion    3+ 4-  Shoulder  extension       Shoulder abduction    3+ 4-  Shoulder extension       Shoulder internal rotation       Shoulder external rotation       Elbow flexion       Elbow extension       Grip strength        (Blank rows = not tested)  (Key: WFL =  within functional limits not formally assessed, * = concordant pain, s = stiffness/stretching sensation, NT = not tested)  Comments: deferred on evaluation   PALPATION:  Notable tightness throughout L UT, LS, deltoid, and lat but pt denies overt tenderness. No bony tenderness or gross deformities on palpation        TODAY'S TREATMENT:     [facility] Adult PT Treatment:                                                DATE: 06/12/2023  Therapeutic Exercise: Warm up - pulley flexion/scaption x 3' each  Standing shoulder abduction with red resistance band, 2 x 10 Standing shoulder diagonals with yellow resistance band diagonal, 2 x 10  Isometric ER/IR walkouts, x 10 each side with yellow theraband with frequent cueing, towel in axilla Resisted rows, RTB, x 25 Shoulder extension/pull downs, GTB x 20  Shoulder depression into physioball - seated, 5 sec hold x 20    [facility] Adult PT Treatment:                                                DATE: 06/05/2023  Therapeutic Exercise: Warm up - pulley flexion/scaption x 2' each  Shoulder rolls fwd/back x 15 each  Seated L D2 flexion AROM x 15  Standing shoulder abduction with red resistance band, 2 x 10 GTB shoulder extension 2x15 cues for posture  Isometric ER/IR walkouts, x 10 each side with yellow theraband with frequent cueing, towel in axilla Resisted rows, RTB, 2 x 10  Shoulder depression into physioball - seated, 5 sec hold x 20     [facility] Adult PT Treatment:                                                DATE: 05/29/2023  Therapeutic Exercise: Warm up - pulley flexion/scaption x 2' each  Shoulder rolls fwd/back x 10 each  Standing shoulder flexion with yellow resistance band x 15 with frequent cueing  for elbow extension  Standing shoulder abduction with resistance band GTB shoulder extension 2x15 cues for posture  Isometric ER/IR walkouts, x 10 each side with red theraband with frequent cueing  Resisted rows, RTB, 2 x 10  Shoulder depression into physioball - seated, 5 sec hold x 10       PATIENT EDUCATION: Education details: rationale for interventions, HEP, importance of activity modification based on symptoms Person educated: Patient Education method: Explanation, Demonstration, Tactile cues, Verbal cues Education comprehension: verbalized understanding, returned demonstration, verbal cues required, tactile cues required, and needs further education     HOME EXERCISE PROGRAM: Access Code: [email or web address] URL: [email or web address] Date: 05/02/2023 Prepared by: Fransisco Hertz  Exercises - Seated Shoulder Flexion Towel Slide at Table Top  - 1 x daily - 7 x weekly - 2 sets - 10 reps - Gentle Levator Scapulae Stretch  - 1 x daily - 7 x weekly - 1-3 sets - 1-3 reps - 30sec hold - Seated Gentle Upper Trapezius Stretch  - 1 x daily - 7 x weekly - 1-3 sets - 1-3 reps - 30sec  hold - Standing Shoulder Row with Anchored Resistance  - 1 x daily - 7 x weekly - 2 sets - 10 reps - Supine Shoulder Flexion Extension AAROM with Dowel  - 1 x daily - 7 x weekly - 2 sets - 10 reps   ASSESSMENT:   CLINICAL IMPRESSION:  Neala is independent with her home exercise program and is demonstrating significant improvement with isometric ER/IR walkouts. We will continue to progress strengthening program with focusing on functional improvements. Plan is to discharge within next 2-3 visits.     OBJECTIVE IMPAIRMENTS: decreased activity tolerance, decreased endurance, decreased mobility, decreased ROM, decreased strength, hypomobility, impaired UE functional use, postural dysfunction, and pain.    ACTIVITY LIMITATIONS: carrying, lifting, bathing, dressing, reach over head, and hygiene/grooming    PARTICIPATION LIMITATIONS: meal prep, cleaning, and laundry   PERSONAL FACTORS: Age and 3+ comorbidities: afib, HTN, osteoporosis  are also affecting patient's functional outcome.    REHAB POTENTIAL: Good   CLINICAL DECISION MAKING: Stable/uncomplicated   EVALUATION COMPLEXITY: Low     GOALS: Goals reviewed with patient? No given time constraints   SHORT TERM GOALS: Target date: 04/08/2023 Pt will demonstrate appropriate understanding and performance of initially prescribed HEP in order to facilitate improved independence with management of symptoms.  Baseline: HEP TBD 04/11/23: Pt endorses good compliance with HEP Goal status: MET   2. Pt will improve greater than or equal to MCID on FOTO in order to demonstrate improved perception of function due to symptoms.            Baseline: FOTO TBD   04/11/23: FOTO/ODI not administered             Goal status: NOT MET   LONG TERM GOALS: Target date: 07/04/2023 (extended 05/22/23)   Pt will meet predicted score on FOTO in order to demonstrate improved perception of function due to symptoms. Baseline: FOTO TBD Goal status: NOT MET   2.  Pt will demonstrate at least 140 degrees of active shoulder elevation in order to demonstrate improved tolerance to functional movement patterns such as reaching overhead and upper body dressing.  Baseline: see ROM chart above 04/25/23: see ROM chart above Goal status: ONGOING   3.  Pt will demonstrate at least 4/5 shoulder flexion MMT for improved symmetry of UE strength and improved tolerance to functional movements.  Baseline: MMT deferred on eval given proximity to injury 04/25/23: NT Goal status: ONGOING   4. Pt will report ability to perform upper body dressing with less than 2 point increase in pain on NPS in order to indicate improved tolerance to ADLs.            Baseline: 0-6/10 pain with ADLs  04/25/23: 0-3/10            Goal status: MET   5. Pt will demonstrate appropriate performance of final  prescribed HEP in order to facilitate improved self-management of symptoms post-discharge.             Baseline: HEP TBD  04/25/23: endorses good compliance with HEP, no issues            Goal status: MET      6. Pt will endorse at least 50% reduction in overall pain levels to facilitate improved tolerance to daily activities.             Baseline: 0-6/10 on NPS  04/25/23: 0-3/10            Goal status: MET  PLAN (updated 05/21/23):   PT FREQUENCY: 1x/week   PT DURATION: 6 weeks   PLANNED INTERVENTIONS: Therapeutic exercises, Therapeutic activity, Neuromuscular re-education, Balance training, Gait training, Patient/Family education, Self Care, Joint mobilization, Aquatic Therapy, Dry Needling, Electrical stimulation, Spinal mobilization, Cryotherapy, Moist heat, Taping, Vasopneumatic device, Manual therapy, and Re-evaluation   PLAN FOR NEXT SESSION: continue to progress GH mobility as able/appropriate, periscapular resistance training as tolerated, continue with resistance progression as tolerated   Mauri Reading PT, DPT 06/12/2023 4:59 PM

## 2023-06-19 ENCOUNTER — Ambulatory Visit: Payer: Self-pay

## 2023-06-19 ENCOUNTER — Telehealth: Payer: Self-pay

## 2023-06-19 NOTE — Telephone Encounter (Signed)
Spoke with daughter via in-person interpreter d/t missed appt today. Confirmed nxt appt time. - MJ

## 2023-06-23 NOTE — Progress Notes (Unsigned)
Office Visit Note   Patient: [name] [name]           Date of Birth: [birth year]           MRN: [id number] Visit Date: 06/24/2023              Requested by: Hoy Register, MD [address] [place],  [place] [address] PCP: Hoy Register, MD   Assessment & Plan: Visit Diagnoses:  1. Traumatic closed displaced fracture of left shoulder with anterior dislocation, initial encounter     Plan: Patient is now 6 weeks status post left traumatic shoulder dislocation.  She has been doing physical therapy once a week and has improved her function and strength significantly.  Very happy with the improvement.  At this point I will let her continue to do PT until she meets her goals.  She does not need to follow-up with me anymore.  Follow-Up Instructions: No follow-ups on file.   Orders:  No orders of the defined types were placed in this encounter.  No orders of the defined types were placed in this encounter.     Procedures: No procedures performed   Clinical Data: No additional findings.   Subjective: Chief Complaint  Patient presents with   Left Shoulder - Follow-up    HPI Patient returns today for 6-week checkup status post left shoulder dislocation.  She is doing physical therapy once a week and is feeling much better. Review of Systems   Objective: Vital Signs: There were no vitals taken for this visit.  Physical Exam  Ortho Exam Examination of left shoulder shows significant improvement in strength in forward flexion abduction external rotation and internal rotation. Specialty Comments:  No specialty comments available.  Imaging: No results found.   PMFS History: Patient Active Problem List   Diagnosis Date Noted   Atrial fibrillation (HCC) 10/01/2019   Atrial fibrillation with RVR (HCC) 10/01/2019   Osteopenia of multiple sites 02/05/2019   Hyperlipidemia 02/05/2019   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/2017   Post-traumatic  osteoarthritis, right ankle and foot    Seasonal allergic rhinitis due to pollen 05/12/2017   Allergy    Cataract 01/28/2017   Presbyopia of both eyes 01/28/2017   Pterygium eye, bilateral 01/28/2017   Astigmatism, bilateral 01/28/2017   Hypermetropia, bilateral 01/28/2017   Acquired bilateral flat feet 09/07/2015   Osteoporosis 09/07/2015   Elevated troponin 01/26/2015   Generalized anxiety disorder 01/26/2015   Hypertension 07/28/2014   Gastritis 07/28/2014   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   OBESITY 05/10/2009   NEPHROLITHIASIS 06/15/2008   Dyslipidemia 03/16/2008   OSTEOARTHRITIS, 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  Past Surgical History:  Procedure Laterality Date   ANKLE FUSION Right 12/24/2017   Tibiocalcaneal fusion   ANKLE FUSION Right 12/24/2017   Procedure: RIGHT TIBIOCALCANEAL FUSION;  Surgeon: Nadara Mustard, MD;  Location: [facility] OR;  Service: Orthopedics;  Laterality: Right;   CHOLECYSTECTOMY     Social History   Occupational History   Not on file  Tobacco Use   Smoking status: Never   Smokeless tobacco: Never  Vaping Use   Vaping Use: Never used  Substance and Sexual Activity   Alcohol use: No    Alcohol/week: 0.0 standard drinks of alcohol   Drug use: No   Sexual activity: Not Currently

## 2023-06-24 ENCOUNTER — Ambulatory Visit (INDEPENDENT_AMBULATORY_CARE_PROVIDER_SITE_OTHER): Payer: Self-pay | Admitting: Orthopaedic Surgery

## 2023-06-24 DIAGNOSIS — S4292XA Fracture of left shoulder girdle, part unspecified, initial encounter for closed fracture: Secondary | ICD-10-CM

## 2023-07-01 ENCOUNTER — Ambulatory Visit: Payer: Self-pay

## 2023-07-03 ENCOUNTER — Ambulatory Visit: Payer: Self-pay | Attending: Physician Assistant

## 2023-07-03 DIAGNOSIS — M25512 Pain in left shoulder: Secondary | ICD-10-CM | POA: Insufficient documentation

## 2023-07-03 DIAGNOSIS — M6281 Muscle weakness (generalized): Secondary | ICD-10-CM | POA: Insufficient documentation

## 2023-07-03 DIAGNOSIS — M25612 Stiffness of left shoulder, not elsewhere classified: Secondary | ICD-10-CM | POA: Insufficient documentation

## 2023-07-03 NOTE — Therapy (Signed)
PHYSICAL THERAPY DISCHARGE SUMMARY  Visits from Start of Care: 19  Current functional level related to goals / functional outcomes: See objective/assessment   Remaining deficits: See objective/assessment   Education / Equipment: See objective/assessment    Patient agrees to discharge. Patient goals were met. Patient is being discharged due to meeting the stated rehab goals.     OUTPATIENT PHYSICAL THERAPY TREATMENT NOTE   Patient Name: [name] [name] MRN: [id number] DOB:02-08-1951, 72 y.o., female Today's Date: 07/03/2023   PCP: Grayce Sessions, NP   REFERRING PROVIDER: Tarry Kos, MD   END OF SESSION:   PT End of Session - 07/03/23 1536     Visit Number 19    PT Start Time 1532    PT Stop Time 1602    PT Time Calculation (min) 30 min    Activity Tolerance Patient tolerated treatment well    Behavior During Therapy [facility] for tasks assessed/performed                 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/2017   Hypertension    Osteoarthritis of ankle, right    and Subtalar Joint   Osteoporosis    Presbyopia of both eyes 01/28/2017   Pterygium eye, bilateral 01/28/2017   Past Surgical History:  Procedure Laterality Date   ANKLE FUSION Right 12/24/2017   Tibiocalcaneal fusion   ANKLE FUSION Right 12/24/2017   Procedure: RIGHT TIBIOCALCANEAL FUSION;  Surgeon: Nadara Mustard, MD;  Location: [facility] OR;  Service: Orthopedics;  Laterality: Right;   CHOLECYSTECTOMY     Patient Active Problem List   Diagnosis Date Noted   Atrial fibrillation (HCC) 10/01/2019   Atrial fibrillation with RVR (HCC) 10/01/2019   Osteopenia of multiple sites 02/05/2019   Hyperlipidemia 02/05/2019   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/2017   Post-traumatic osteoarthritis,  right ankle and foot    Seasonal allergic rhinitis due to pollen 05/12/2017   Allergy    Cataract 01/28/2017   Presbyopia of both eyes 01/28/2017   Pterygium eye, bilateral 01/28/2017   Astigmatism, bilateral 01/28/2017   Hypermetropia, bilateral 01/28/2017   Acquired bilateral flat feet 09/07/2015   Osteoporosis 09/07/2015   Elevated troponin 01/26/2015   Generalized anxiety disorder 01/26/2015   Hypertension 07/28/2014   Gastritis 07/28/2014   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   OBESITY 05/10/2009   NEPHROLITHIASIS 06/15/2008   Dyslipidemia 03/16/2008   OSTEOARTHRITIS, LUMBAR SPINE 01/09/2008   BUNDLE BRANCH BLOCK, LEFT 12/11/2007   PTSD 10/01/2007   ARM PAIN, LEFT 10/01/2007    REFERRING DIAG:  W09.92XA (ICD-10-CM) - Traumatic closed displaced fracture of left shoulder with anterior dislocation, initial encounter   THERAPY DIAG:  Left shoulder pain, unspecified chronicity  Stiffness of left shoulder, not elsewhere classified  Muscle weakness (generalized)  Rationale for Evaluation and Treatment Rehabilitation  PERTINENT HISTORY: afib, HTN, osteoporosis; fall on 01/12/23  PRECAUTIONS: no precautions in chart review or per discussion w/ pt (>8 weeks out from fall); hx of falls  SUBJECTIVE:  SUBJECTIVE STATEMENT:   Patient continues to report that she is doing well with ADLs and has improved mobility. She endorses feeling better with PT and expressing gratitude for the exercises throughout her POC. She has improved tolerance of her grooming tasks.   She reports 0/10 pain today.   She is accompanied by her daughter who provides interpretation throughout today's session.     PAIN:  Are you having pain: 05/21/2023  none at rest  Location/description: L shoulder, lateral/superior Worst: 3-4/10 on  NPS Per eval: Best-worst over past week: 6/10  - aggravating factors: mostly at night or when moving  - Easing factors: medication, change in positioning, rest   OBJECTIVE: (objective measures completed at initial evaluation unless otherwise dated)   DIAGNOSTIC FINDINGS:  02/08/23 L shoulder MRI:  IMPRESSION: 1. Complete tear of the supraspinatus tendon with 3.5 cm of retraction. 2. Severe tendinosis of the infraspinatus tendon with a small interstitial tear. 3. Mild tendinosis of the subscapularis tendon. 4. Intra-articular portion of the long head of the biceps tendon is attenuated concerning for a complete tear. 5. Nondisplaced impaction fracture of the superolateral humeral head involving the greater tuberosity with subcortical marrow edema.   PATIENT SURVEYS:  FOTO: deferred on eval given time constraints/language, plan to administer as appropriate    COGNITION: Overall cognitive status: Within functional limits for tasks assessed, pleasant and conversationally appropriate, does endorse some mild memory deficits                                  SENSATION: No neuro complaints - light touch intact throughout B UE   POSTURE: Guarded L UE, mild fwd head and kyphosis    UPPER EXTREMITY ROM:   A/PROM Right eval Left Eval AROM Left 04/01/23 Left 04/08/23 AROM Left A/ROM 04/22/23 L A/PROM 04/25/23 L A/PROM 05/08/23 L AROM 07/03/23  Shoulder flexion 165 deg  100 deg  112 deg 120 deg painless  115 deg pre MT, 120 deg post MT / 134 deg painless 110 deg pre session  112 post exercise/ 138 deg  129 deg / 145 deg 165 deg  Shoulder abduction 158 deg 92 deg     100 deg 110 deg  180 deg  Shoulder internal rotation (functional)  C7 NT    UT  T2  Shoulder external rotation (functional) T12 NT    L5  T12  Elbow flexion            Elbow extension            Wrist flexion            Wrist extension             (Blank rows = not tested) (Key: WFL = within functional limits not formally  assessed, * = concordant pain, s = stiffness/stretching sensation, NT = not tested)  Comments: passive ROM approximately equivalent to AROM - limited assessment by muscle guarding   UPPER EXTREMITY MMT:   MMT Right eval Left eval Left 05/21/23 Left 07/03/23  Shoulder flexion    3+ 4- 4+  Shoulder extension        Shoulder abduction    3+ 4- 4-  Shoulder extension        Shoulder internal rotation        Shoulder external rotation        Elbow flexion        Elbow extension  Grip strength         (Blank rows = not tested)  (Key: WFL = within functional limits not formally assessed, * = concordant pain, s = stiffness/stretching sensation, NT = not tested)  Comments: deferred on evaluation   PALPATION:  Notable tightness throughout L UT, LS, deltoid, and lat but pt denies overt tenderness. No bony tenderness or gross deformities on palpation       TODAY'S TREATMENT:     [facility] Adult PT Treatment:                                                DATE: 07/03/2023  Therapeutic Exercise: Warm up - pulley flexion/scaption x 3' each  Standing shoulder abduction with red resistance band, 2 x 10 Standing shoulder diagonals with yellow resistance band diagonal, 2 x 10  Isometric ER/IR walkouts, x 10 each side with red theraband with frequent cueing, towel in axilla Shoulder extension/pull downs, GTB x 20  Resisted shoulder adduction with 10#    [facility] Adult PT Treatment:                                                DATE: 06/12/2023  Therapeutic Exercise: Warm up - pulley flexion/scaption x 3' each  Standing shoulder abduction with red resistance band, 2 x 10 Standing shoulder diagonals with yellow resistance band diagonal, 2 x 10  Isometric ER/IR walkouts, x 10 each side with yellow theraband with frequent cueing, towel in axilla Resisted rows, RTB, x 25 Shoulder extension/pull downs, GTB x 20  Shoulder depression into physioball - seated, 5 sec hold x 20    [facility] Adult PT  Treatment:                                                DATE: 06/05/2023  Therapeutic Exercise: Warm up - pulley flexion/scaption x 2' each  Shoulder rolls fwd/back x 15 each  Seated L D2 flexion AROM x 15  Standing shoulder abduction with red resistance band, 2 x 10 GTB shoulder extension 2x15 cues for posture  Isometric ER/IR walkouts, x 10 each side with yellow theraband with frequent cueing, towel in axilla Resisted rows, RTB, 2 x 10  Shoulder depression into physioball - seated, 5 sec hold x 20       PATIENT EDUCATION: Education details: rationale for interventions, HEP, importance of activity modification based on symptoms Person educated: Patient Education method: Explanation, Demonstration, Tactile cues, Verbal cues Education comprehension: verbalized understanding, returned demonstration, verbal cues required, tactile cues required, and needs further education     HOME EXERCISE PROGRAM: Access Code: [email or web address] URL: [email or web address] Date: 05/02/2023 Prepared by: Fransisco Hertz  Exercises - Seated Shoulder Flexion Towel Slide at Table Top  - 1 x daily - 7 x weekly - 2 sets - 10 reps - Gentle Levator Scapulae Stretch  - 1 x daily - 7 x weekly - 1-3 sets - 1-3 reps - 30sec hold - Seated Gentle Upper Trapezius Stretch  - 1 x daily - 7 x weekly - 1-3 sets - 1-3 reps - 30sec hold -  Standing Shoulder Row with Anchored Resistance  - 1 x daily - 7 x weekly - 2 sets - 10 reps - Supine Shoulder Flexion Extension AAROM with Dowel  - 1 x daily - 7 x weekly - 2 sets - 10 reps   ASSESSMENT:   CLINICAL IMPRESSION: Darius has demonstrated great progression with skilled PT and has MET all established goals. She has improved Lt UE AROM, and LT UE MMT. She has been able to perform normal IADLs and ADLs with minimal to no pain. She will be discharged from skilled PT care at this time to remain independent with home program.     OBJECTIVE IMPAIRMENTS: decreased activity  tolerance, decreased endurance, decreased mobility, decreased ROM, decreased strength, hypomobility, impaired UE functional use, postural dysfunction, and pain.    ACTIVITY LIMITATIONS: carrying, lifting, bathing, dressing, reach over head, and hygiene/grooming   PARTICIPATION LIMITATIONS: meal prep, cleaning, and laundry   PERSONAL FACTORS: Age and 3+ comorbidities: afib, HTN, osteoporosis  are also affecting patient's functional outcome.    REHAB POTENTIAL: Good   CLINICAL DECISION MAKING: Stable/uncomplicated   EVALUATION COMPLEXITY: Low     GOALS: Goals reviewed with patient? No given time constraints   SHORT TERM GOALS: Target date: 04/08/2023 Pt will demonstrate appropriate understanding and performance of initially prescribed HEP in order to facilitate improved independence with management of symptoms.  Baseline: HEP TBD 04/11/23: Pt endorses good compliance with HEP Goal status: MET   2. Pt will improve greater than or equal to MCID on FOTO in order to demonstrate improved perception of function due to symptoms.            Baseline: FOTO TBD   04/11/23: FOTO/ODI not administered             Goal status: DISCONTINUED    LONG TERM GOALS: Target date: 07/04/2023 (extended 05/22/23)   Pt will meet predicted score on FOTO in order to demonstrate improved perception of function due to symptoms. Baseline: FOTO TBD Goal status: DISCONTINUED   2.  Pt will demonstrate at least 140 degrees of active shoulder elevation in order to demonstrate improved tolerance to functional movement patterns such as reaching overhead and upper body dressing.  Baseline: see ROM chart above 04/25/23: see ROM chart above Goal status: MET   3.  Pt will demonstrate at least 4/5 shoulder flexion MMT for improved symmetry of UE strength and improved tolerance to functional movements.  Baseline: MMT deferred on eval given proximity to injury 04/25/23: NT Goal status: MET   4. Pt will report ability to  perform upper body dressing with less than 2 point increase in pain on NPS in order to indicate improved tolerance to ADLs.            Baseline: 0-6/10 pain with ADLs  04/25/23: 0-3/10            Goal status: MET   5. Pt will demonstrate appropriate performance of final prescribed HEP in order to facilitate improved self-management of symptoms post-discharge.             Baseline: HEP TBD  04/25/23: endorses good compliance with HEP, no issues            Goal status: MET      6. Pt will endorse at least 50% reduction in overall pain levels to facilitate improved tolerance to daily activities.             Baseline: 0-6/10 on NPS  04/25/23: 0-3/10  Goal status: MET     PLAN (updated 05/21/23):   PT FREQUENCY: 1x/week   PT DURATION: 6 weeks   PLANNED INTERVENTIONS: Therapeutic exercises, Therapeutic activity, Neuromuscular re-education, Balance training, Gait training, Patient/Family education, Self Care, Joint mobilization, Aquatic Therapy, Dry Needling, Electrical stimulation, Spinal mobilization, Cryotherapy, Moist heat, Taping, Vasopneumatic device, Manual therapy, and Re-evaluation   PLAN FOR NEXT SESSION: continue to progress GH mobility as able/appropriate, periscapular resistance training as tolerated, continue with resistance progression as tolerated   Mauri Reading PT, DPT 07/03/2023 4:42 PM

## 2023-07-16 ENCOUNTER — Other Ambulatory Visit: Payer: Self-pay

## 2023-11-05 ENCOUNTER — Ambulatory Visit: Payer: Self-pay | Attending: Family Medicine | Admitting: Family Medicine

## 2023-11-05 ENCOUNTER — Other Ambulatory Visit: Payer: Self-pay

## 2023-11-05 ENCOUNTER — Encounter: Payer: Self-pay | Admitting: Family Medicine

## 2023-11-05 VITALS — BP 131/79 | HR 70 | Ht 62.0 in | Wt 185.0 lb

## 2023-11-05 DIAGNOSIS — I10 Essential (primary) hypertension: Secondary | ICD-10-CM

## 2023-11-05 DIAGNOSIS — E785 Hyperlipidemia, unspecified: Secondary | ICD-10-CM

## 2023-11-05 DIAGNOSIS — Z23 Encounter for immunization: Secondary | ICD-10-CM

## 2023-11-05 DIAGNOSIS — F419 Anxiety disorder, unspecified: Secondary | ICD-10-CM

## 2023-11-05 DIAGNOSIS — R7303 Prediabetes: Secondary | ICD-10-CM

## 2023-11-05 MED ORDER — ROSUVASTATIN CALCIUM 20 MG PO TABS
20.0000 mg | ORAL_TABLET | Freq: Every day | ORAL | 1 refills | Status: DC
  Filled 2023-11-05: qty 90, 90d supply, fill #0
  Filled 2024-01-26: qty 90, 90d supply, fill #1

## 2023-11-05 MED ORDER — METOPROLOL SUCCINATE ER 25 MG PO TB24
12.5000 mg | ORAL_TABLET | Freq: Every day | ORAL | 1 refills | Status: DC
  Filled 2023-11-05: qty 45, 90d supply, fill #0
  Filled 2024-01-26 (×2): qty 45, 90d supply, fill #1
  Filled 2024-04-14: qty 45, 90d supply, fill #2
  Filled 2024-06-28 (×3): qty 45, 90d supply, fill #3

## 2023-11-05 MED ORDER — LISINOPRIL-HYDROCHLOROTHIAZIDE 20-25 MG PO TABS
1.0000 | ORAL_TABLET | Freq: Every day | ORAL | 1 refills | Status: DC
  Filled 2023-11-05: qty 90, 90d supply, fill #0
  Filled 2024-01-26: qty 90, 90d supply, fill #1

## 2023-11-05 NOTE — Patient Instructions (Signed)
Plan de alimentacin DASH DASH Eating Plan DASH es la sigla en ingls de "Enfoques alimentarios para detener la hipertensin" (Dietary Approaches to Stop Hypertension). El plan de alimentacin DASH ha demostrado: Bajar la presin arterial alta (hipertensin). Reducir el riesgo de diabetes tipo 2, enfermedad cardaca y accidente cerebrovascular. Ayudar a perder peso. Consejos para seguir este plan Leer las etiquetas de los alimentos Verifique la cantidad de sal (sodio) por porcin en las etiquetas de los alimentos. Elija alimentos con menos del 5 por ciento del valor diario (VD) de sodio. En general, los alimentos con menos de 300 miligramos (mg) de sodio por porcin se encuadran dentro de este plan alimentario. Para encontrar cereales integrales, busque la palabra "integral" como primera palabra en la lista de ingredientes. Al ir de compras Compre productos en los que en su etiqueta diga: "bajo contenido de sodio" o "sin sal agregada". Compre alimentos frescos. Evite los alimentos enlatados y comidas precocidas o congeladas. Al cocinar Trate de no agregar sal cuando cocine. Use hierbas o aderezos sin sal, en lugar de sal de mesa o sal marina. Consulte al mdico o farmacutico antes de usar sustitutos de la sal. No fra los alimentos. A la hora de cocinar los alimentos, opte por hornearlos, hervirlos, grillarlos, asarlos al horno o a la parrilla. Cocine con aceites que sean buenos para el corazn. Estos incluyen el aceite de oliva, canola, aguacate, soja y girasol. Planificacin de las comidas  Siga una dieta equilibrada. Esto debe incluir lo siguiente: 4 o ms porciones de frutas y 4 o ms porciones de verduras por da. Trate de que medio plato de cada comida sea de frutas y verduras. De 6 a 8 porciones de cereales integrales todos los das. 6 o menos porciones de carne magra, ave o pescado por da. 1 onza es 1 porcin. Una porcin de 3 onzas (85 g) de carne tiene casi el mismo tamao que la  palma de la mano. Un huevo es 1 onza (28 g). De 2 a 3 porciones de productos lcteos descremados por da. Una porcin es 1 taza (237 ml). 1 porcin de frutos secos, semillas o frijoles 5 veces por semana. De 2 a 3 porciones de grasas cardiosaludables. Las grasas saludables llamadas cidos grasos omega-3 se encuentran en alimentos como las nueces, las semillas de lino, las leches fortificadas y los huevos. Estas grasas tambin se encuentran en los pescados de agua fra, como la sardina, el salmn y la caballa. Limite la cantidad que consume de: Alimentos enlatados o envasados. Alimentos con alto contenido de grasa trans, como alimentos fritos. Alimentos con alto contenido de grasa saturada, como carne con grasa. Postres y otros dulces, bebidas azucaradas y otros alimentos con azcar agregada. Productos lcteos enteros. No le agregue sal a los alimentos antes de probarlos. No coma ms de 4 yemas de huevo por semana. Trate de comer al menos 2 comidas vegetarianas por semana. Consuma ms comida casera y menos de restaurante, de bares y comida rpida. Estilo de vida Cuando coma en un restaurante, pida que preparen su comida con menos sal, o bien, sin nada de sal. Si bebe alcohol: Limite la cantidad que bebe a lo siguiente: De 0 a 1 medida al da si es mujer. De 0 a 2 medidas al da si es varn. Sepa cunta cantidad de alcohol hay en las bebidas que toma. En los [place], una medida es una botella de cerveza de 12 oz (355 ml), un vaso de vino de 5 oz (148 ml) o   un vaso de una bebida alcohlica de alta graduacin de 1 oz (44 ml). Informacin general Evite ingerir ms de 2,300 mg de sal por da. Si tiene hipertensin, es posible que necesite reducir la ingesta de sodio a 1,500 mg por da. Trabaje con el mdico para mantenerse en un peso saludable o para perder peso. Pregunte cul es el rango de peso recomendable para usted. La mayora de los das de la semana, realice al menos 30 minutos de  ejercicio que haga que se acelere su corazn. Estos pueden incluir caminar, nadar o andar en bicicleta. Trabaje con su mdico o nutricionista para ajustar su plan alimentario a sus necesidades calricas especficas. Qu alimentos debo comer? Frutas Todas las frutas frescas, congeladas o secas. Frutas enlatadas que estn en su jugo natural y que no tengan azcar agregada. Verduras Verduras frescas o congeladas crudas, cocidas al vapor, asadas o grilladas. Jugos de tomate y verduras con bajo contenido de sodio o reducidos en sodio. Salsa y pasta de tomate con bajo contenido de sodio o reducidas en sodio. Verduras enlatadas con bajo contenido de sodio o reducidas en sodio. Granos Pan de salvado o integral. Pasta de salvado o integral. Arroz integral. Avena. Quinua. Trigo burgol. Cereales integrales y con bajo contenido de sodio. Pan pita. Galletitas de agua con bajo contenido de grasa y sodio. Tortillas de harina integral. Carnes y otras protenas Pollo o pavo sin piel. Carne de pollo o de pavo molida. Cerdo desgrasado. Pescado y mariscos. Claras de huevo. Porotos, guisantes o lentejas secos. Frutos secos, mantequilla de frutos secos y semillas sin sal. Frijoles enlatados sin sal. Cortes de carne vacuna magra, desgrasada. Carne precocida o curada magra y baja en sodio, como embutidos o panes de carne. Lcteos Leche descremada (1 %) o descremada (desnatada). Quesos reducidos en grasa, con bajo contenido de grasa o descremados. Queso blanco o ricota sin grasa, con bajo contenido de sodio. Yogur semidescremado o descremado. Queso con bajo contenido de grasa y sodio. Grasas y aceites Margarinas untables que no contengan grasas trans. Aceite vegetal. Mayonesa y aderezos para ensaladas livianos, reducidos en grasa o con bajo contenido de grasas (reducidos en sodio). Aceite de canola, crtamo, oliva, aguacate, soja y girasol. Aguacate. Alios y condimentos Hierbas. Especias. Mezclas de condimentos sin  sal. Otros alimentos Palomitas de maz y pretzels sin sal. Dulces con bajo contenido de grasas. Es posible que los productos que se enumeran ms arriba no sean todos los alimentos y las bebidas que puede consumir. Consulte a un nutricionista para obtener ms informacin. Qu alimentos debo evitar? Frutas Fruta enlatada en almbar liviano o espeso. Frutas cocidas en aceite. Frutas con salsa de crema o mantequilla. Verduras Verduras con crema o fritas. Verduras en salsa de queso. Verduras enlatadas regulares que no sean con bajo contenido de sodio o reducidas en sodio. Pasta y salsa de tomates enlatadas regulares que no sean con bajo contenido de sodio o reducidas en sodio. Jugos de tomate y de verduras regulares que no sean con bajo contenido de sodio o reducidos en sodio. Pepinillos. Aceitunas. Granos Productos de panificacin hechos con grasa, como medialunas, magdalenas y algunos panes. Comidas con arroz o pasta seca listas para usar. Carnes y otras protenas Cortes de carne con alto contenido de grasa. Costillas. Carne frita. Tocino. Mortadela, salame y otras carnes precocidas o curadas, como embutidos o panes de carne, que no sean magros y que no sean con bajo contenido de sodio. Grasa de la espalda del cerdo (panceta). Salchicha de cerdo. Frutos secos   y semillas con sal. Frijoles enlatados con sal agregada. Pescado enlatado o ahumado. Huevos enteros o yemas. Pollo o pavo con piel. Lcteos Leche entera o al 2 %, crema y mitad leche y mitad crema. Queso crema entero o con toda su grasa. Yogur entero o endulzado. Quesos con toda su grasa. Sustitutos de cremas no lcteas. Coberturas batidas. Quesos para untar y quesos procesados. Grasas y aceites Mantequilla. Margarina en barra. Manteca de cerdo. Materia grasa. Mantequilla clarificada. Grasa de tocino. Aceites tropicales como aceite de coco, palmiste o palma. Alios y condimentos Sal de cebolla, sal de ajo, sal condimentada, sal de mesa y sal  marina. Salsa [place]. Salsa trtara. Salsa barbacoa. Salsa teriyaki. Salsa de soja, incluso la que tiene contenido reducido de sodio. Salsa de carne. Salsas en lata y envasadas. Salsa de pescado. Salsa de ostras. Salsa rosada. Rbanos picantes comprados en tiendas. [place]. Mostaza. Saborizantes y tiernizantes para carne. Caldo en cubitos. Salsas picantes. Adobos preelaborados o envasados. Aderezos para tacos preelaborados o envasados. Salsas. Aderezos comunes para ensalada. Otros alimentos Palomitas de maz y pretzels con sal. Es posible que los productos que se enumeran ms arriba no sean todos los alimentos y las bebidas que debe evitar. Consulte a un nutricionista para obtener ms informacin. Dnde obtener ms informacin National Heart, Lung, and Blood Institute (Instituto Nacional del Corazn, los Pulmones y la Sangre, NHLBI): nhlbi.nih.gov American Heart Association (Asociacin Estadounidense del Corazn, AHA): heart.org Academy of Nutrition and Dietetics (Academia de Nutricin y Diettica): eatright.org National Kidney Foundation (NKF) (Fundacin Nacional del Rin): kidney.org Esta informacin no tiene como fin reemplazar el consejo del mdico. Asegrese de hacerle al mdico cualquier pregunta que tenga. Document Revised: 01/17/2023 Document Reviewed: 01/17/2023 Elsevier Patient Education  2024 Elsevier Inc.  

## 2023-11-05 NOTE — Progress Notes (Signed)
Subjective:  Patient ID: [name] [name], female    DOB: [birth year]  Age: 72 y.o. MRN: [id number]  CC: Medical Management of Chronic Issues   HPI [name] [name] [name] [name] is a 72 y.o. year old female with a history of hypertension, hyperlipidemia, A-fib, fracture of left shoulder with anterior dislocation, Prediabetes. Seen with a Spanish video interpreter today. She is accompanied by her daughter to the visit.  Interval History: Discussed the use of AI scribe software for clinical note transcription with the patient, who gave verbal consent to proceed.  Her hypertension is managed with lisinopril/hydrochlorothiazide, and metoprolol.  She is also on a statin which she has been adherent with.  She presents with acute onset dizziness and nausea which she experienced this morning but then resolved. She denies constipation, diarrhea and abdominal pain. She is scheduled to travel to [place] tomorrow, and has not visited her home country in 20 years.        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  Social History   Socioeconomic History   Marital status: Married    Spouse name: Not on file   Number of children: Not on file   Years of education: Not on file   Highest education  level: Not on file  Occupational History   Not on file  Tobacco Use   Smoking status: Never   Smokeless tobacco: Never  Vaping Use   Vaping status: Never Used  Substance and Sexual Activity   Alcohol use: No    Alcohol/week: 0.0 standard drinks of alcohol   Drug use: No   Sexual activity: Not Currently  Other Topics Concern   Not on file  Social History Narrative   Originally from [place].   [place]  In [place]   Lives with her husband, her widowed daughter, and her daughter's 3 children.   This daughter's husband was tortured and killed by her other daughter's husband when he tried to help [place]   Social Determinants of Health   Financial Resource Strain: Low Risk  (11/05/2023)   Overall Financial Resource Strain (CARDIA)    Difficulty of Paying Living Expenses: Not hard at all  Food Insecurity: Food Insecurity Present (11/05/2023)   Hunger Vital Sign    Worried About Running Out of Food in the Last Year: Never true    Ran Out of Food in the Last Year: Sometimes true  Transportation Needs: No Transportation Needs (11/05/2023)   PRAPARE - Administrator, Civil Service (Medical): No    Lack of Transportation (Non-Medical): No  Physical Activity: Insufficiently Active (11/05/2023)   Exercise Vital Sign    Days of Exercise per Week: 3 days  Minutes of Exercise per Session: 30 min  Stress: No Stress Concern Present (11/05/2023)   [employer] of Occupational Health - Occupational Stress Questionnaire    Feeling of Stress : Not at all  Social Connections: Moderately Integrated (11/05/2023)   Social Connection and Isolation Panel [NHANES]    Frequency of Communication with Friends and Family: Twice a week    Frequency of Social Gatherings with Friends and Family: Twice a week    Attends Religious Services: 1 to 4 times per year    Active Member of [employer] or Organizations: No    Attends Banker Meetings: Never    Marital Status: Married     No Known Allergies  Outpatient Medications Prior to Visit  Medication Sig Dispense Refill   acetaminophen (TYLENOL) 500 MG tablet Take 1,000 mg by mouth every 6 (six) hours as needed for mild pain or headache.     aspirin EC 81 MG tablet Take 1 tablet (81 mg total) by mouth daily. Swallow whole. 30 tablet 12   diclofenac Sodium (VOLTAREN) 1 % GEL Apply 2 g topically 4 (four) times daily. 350 g 1   ibuprofen (ADVIL) 600 MG tablet Take 1 tablet (600 mg total) by mouth every 8 (eight) hours as needed (pain). 30 tablet 1   lisinopril-hydrochlorothiazide (ZESTORETIC) 20-25 MG tablet TAKE 1 TABLET BY MOUTH DAILY. 90 tablet 1   metoprolol succinate (TOPROL-XL) 25 MG 24 hr tablet TAKE 0.5 TABLETS (12.5 MG TOTAL) BY MOUTH DAILY. 180 tablet 1   rosuvastatin (CRESTOR) 20 MG tablet Take 1 tablet (20 mg total) by mouth daily. 90 tablet 1   guaiFENesin-codeine (CHERATUSSIN AC) 100-10 MG/5ML syrup Take 5 mLs by mouth 4 (four) times daily as needed for cough. (Patient not taking: Reported on 05/05/2023) 120 mL 0   No facility-administered medications prior to visit.     ROS Review of Systems  Constitutional:  Negative for activity change and appetite change.  HENT:  Negative for sinus pressure and sore throat.   Respiratory:  Negative for chest tightness, shortness of breath and wheezing.   Cardiovascular:  Negative for chest pain and palpitations.  Gastrointestinal:  Negative for abdominal distention, abdominal pain and constipation.  Genitourinary: Negative.   Musculoskeletal: Negative.   Psychiatric/Behavioral:  Negative for behavioral problems and dysphoric mood.     Objective:  BP 131/79   Pulse 70   Ht 5\' 2"  (1.575 m)   Wt 185 lb (83.9 kg)   SpO2 98%   BMI 33.84 kg/m      11/05/2023    1:42 PM 05/05/2023    1:55 PM 02/19/2023   10:25 AM  BP/Weight  Systolic BP 131 112 143  Diastolic BP 79 72 72  Wt. (Lbs) 185 187 189  BMI 33.84 kg/m2 34.2 kg/m2 35.71 kg/m2      Physical  Exam Constitutional:      Appearance: She is well-developed.  Cardiovascular:     Rate and Rhythm: Normal rate.     Heart sounds: Normal heart sounds. No murmur heard. Pulmonary:     Effort: Pulmonary effort is normal.     Breath sounds: Normal breath sounds. No wheezing or rales.  Chest:     Chest wall: No tenderness.  Abdominal:     General: Bowel sounds are normal. There is no distension.     Palpations: Abdomen is soft. There is no mass.     Tenderness: There is no abdominal tenderness.  Musculoskeletal:  General: Normal range of motion.     Right lower leg: No edema.     Left lower leg: No edema.  Neurological:     Mental Status: She is alert and oriented to person, place, and time.  Psychiatric:        Mood and Affect: Mood normal.        Latest Ref Rng & Units 05/06/2023    2:29 PM 01/12/2023    5:42 PM 09/05/2022    9:05 AM  CMP  Glucose 70 - 99 mg/dL 97  829  562   BUN 8 - 27 mg/dL 20  20  18    Creatinine 0.57 - 1.00 mg/dL 1.30  8.65  7.84   Sodium 134 - 144 mmol/L 140  136  140   Potassium 3.5 - 5.2 mmol/L 4.1  3.7  4.7   Chloride 96 - 106 mmol/L 104  106  102   CO2 20 - 29 mmol/L 19  20  24    Calcium 8.7 - 10.3 mg/dL 9.7  9.1  69.6   Total Protein 6.0 - 8.5 g/dL 6.9   7.0   Total Bilirubin 0.0 - 1.2 mg/dL 0.3   0.8   Alkaline Phos 44 - 121 IU/L 86   97   AST 0 - 40 IU/L 30   21   ALT 0 - 32 IU/L 25   18     Lipid Panel     Component Value Date/Time   CHOL 186 05/06/2023 1432   TRIG 97 05/06/2023 1432   HDL 71 05/06/2023 1432   CHOLHDL 3.7 09/04/2021 1335   CHOLHDL 3.4 02/10/2014 1527   VLDL 17 02/10/2014 1527   LDLCALC 98 05/06/2023 1432    CBC    Component Value Date/Time   WBC 4.6 05/06/2023 1427   WBC 10.5 01/12/2023 1742   RBC 4.13 05/06/2023 1427   RBC 3.91 01/12/2023 1742   HGB 13.0 05/06/2023 1427   HCT 38.6 05/06/2023 1427   PLT 282 05/06/2023 1427   MCV 94 05/06/2023 1427   MCH 31.5 05/06/2023 1427   MCH 32.0 01/12/2023  1742   MCHC 33.7 05/06/2023 1427   MCHC 33.2 01/12/2023 1742   RDW 12.4 05/06/2023 1427   LYMPHSABS 1.4 05/06/2023 1427   MONOABS 0.7 09/30/2019 2358   EOSABS 0.2 05/06/2023 1427   BASOSABS 0.0 05/06/2023 1427    Lab Results  Component Value Date   HGBA1C 5.7 (H) 09/05/2022    Assessment & Plan:     Anxiety Acute onset of dizziness and nausea No associated abdominal pain or diarrhea. Possible anxiety related to upcoming travel. -Advise to monitor symptoms and seek medical attention if symptoms persist or worsen.  Hypertension and Hyperlipidemia Well controlled on current medications. -Continue Lisinopril, Hydrochlorothiazide, Metoprolol, and cholesterol medication. -Order blood work to check kidney function and screen for diabetes.  Prediabetes Last A1c was 5.7 We will repeat A1c today Continue lifestyle modification to prevent progression to type 2 diabetes mellitus   General Health Maintenance / Followup Plans -Administer influenza vaccine today. -Provide 90-day supply of all current medications for travel. -Schedule follow-up visit in 6 months after return from travel.          Meds ordered this encounter  Medications   lisinopril-hydrochlorothiazide (ZESTORETIC) 20-25 MG tablet    Sig: TAKE 1 TABLET BY MOUTH DAILY.    Dispense:  90 tablet    Refill:  1   metoprolol succinate (TOPROL-XL) 25 MG 24 hr tablet  Sig: TAKE 0.5 TABLETS (12.5 MG TOTAL) BY MOUTH DAILY.    Dispense:  180 tablet    Refill:  1   rosuvastatin (CRESTOR) 20 MG tablet    Sig: Take 1 tablet (20 mg total) by mouth daily.    Dispense:  90 tablet    Refill:  1    Follow-up: Return in about 6 months (around 05/04/2024) for Chronic medical conditions.       Hoy Register, MD, FAAFP. [facility] [place], [place] [phone]   11/05/2023, 1:58 PM

## 2023-11-06 LAB — BASIC METABOLIC PANEL
BUN/Creatinine Ratio: 28 (ref 12–28)
BUN: 20 mg/dL (ref 8–27)
CO2: 20 mmol/L (ref 20–29)
Calcium: 10.2 mg/dL (ref 8.7–10.3)
Chloride: 102 mmol/L (ref 96–106)
Creatinine, Ser: 0.71 mg/dL (ref 0.57–1.00)
Glucose: 91 mg/dL (ref 70–99)
Potassium: 4 mmol/L (ref 3.5–5.2)
Sodium: 143 mmol/L (ref 134–144)
eGFR: 89 mL/min/{1.73_m2} (ref >59)

## 2023-11-06 LAB — HEMOGLOBIN A1C
Est. average glucose Bld gHb Est-mCnc: 117 mg/dL
Hgb A1c MFr Bld: 5.7 % — ABNORMAL HIGH (ref 4.8–5.6)

## 2024-01-26 ENCOUNTER — Other Ambulatory Visit: Payer: Self-pay

## 2024-02-17 IMAGING — MG MM DIGITAL SCREENING BILAT W/ TOMO AND CAD
6 of 12 series · 6 of 36 positions shown · non-contrast
Comparison: Previous exam(s).

CLINICAL DATA: Screening.

EXAM:
DIGITAL SCREENING BILATERAL MAMMOGRAM WITH TOMOSYNTHESIS AND CAD
TECHNIQUE: Bilateral screening digital craniocaudal and mediolateral oblique
mammograms were obtained. Bilateral screening digital breast
tomosynthesis was performed. The images were evaluated with
computer-aided detection.

[L MLO synth-2D (1 of 2)]
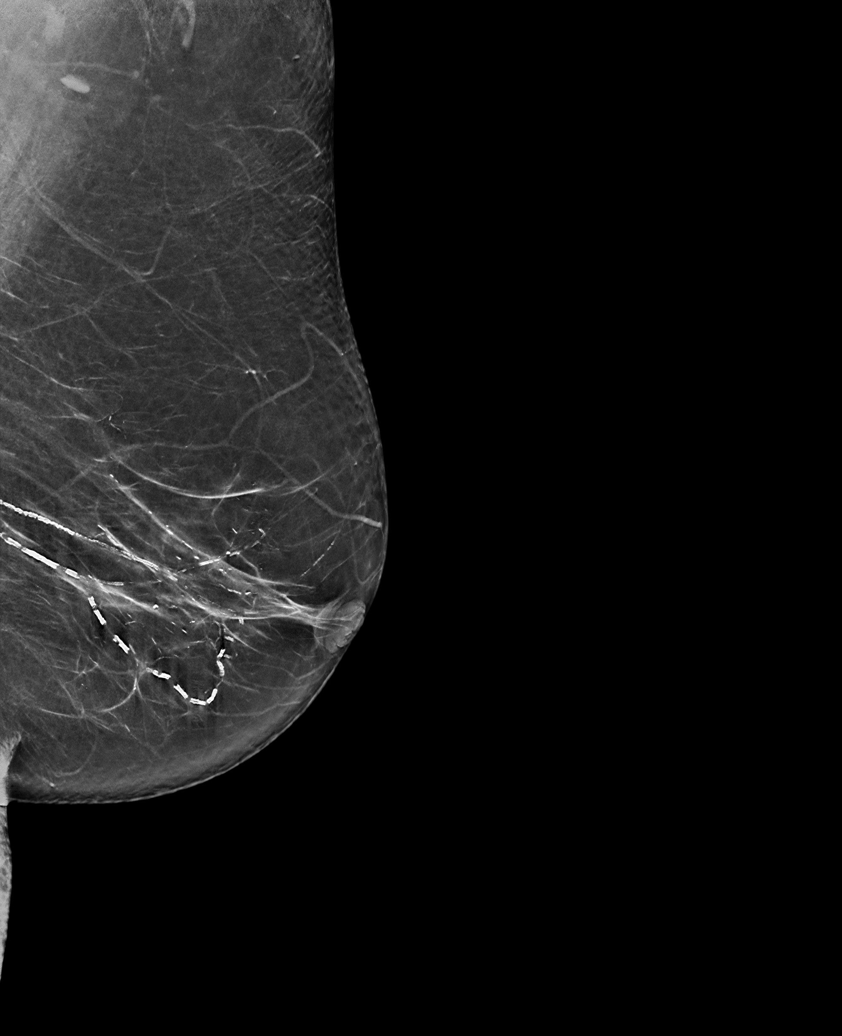

[R MLO synth-2D (1 of 2)]
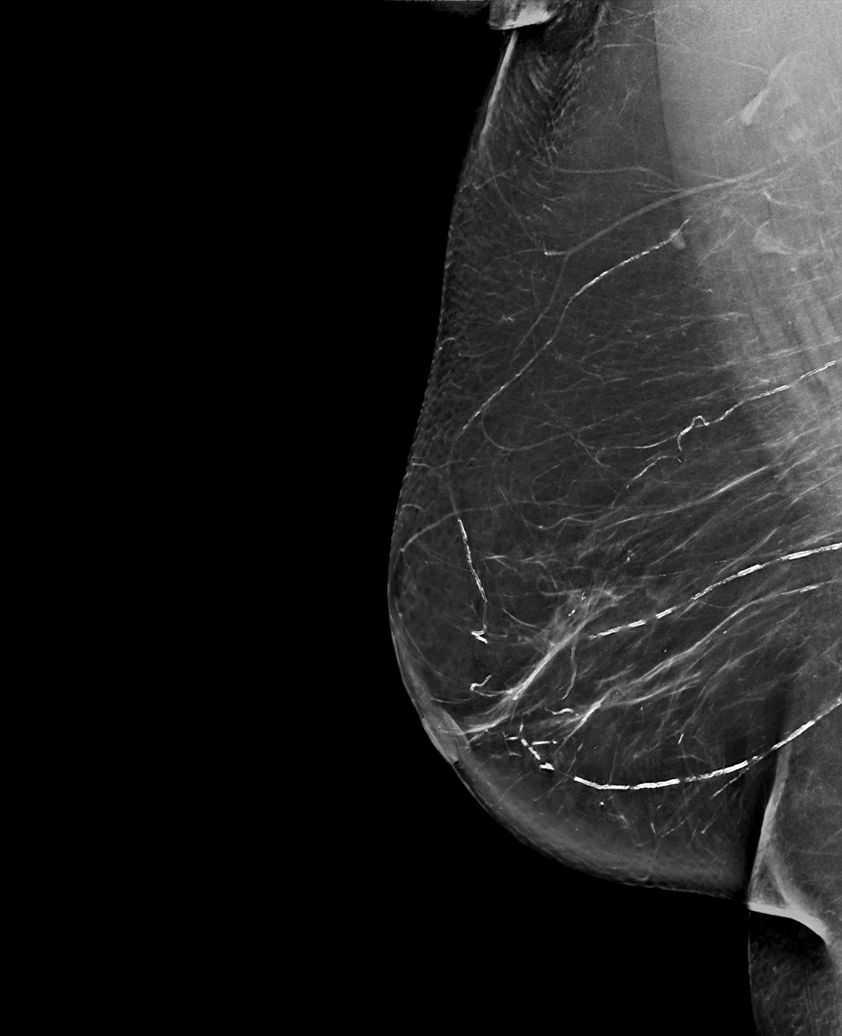

[L MLO synth-2D (2 of 2)]
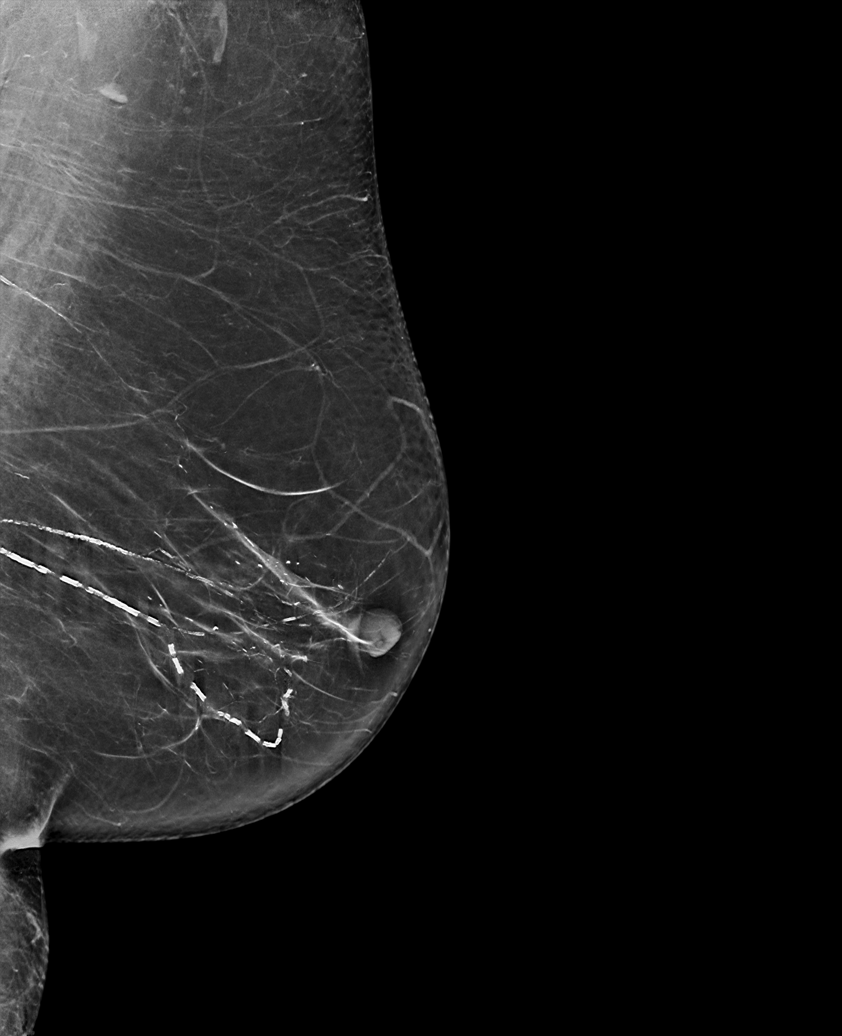

[L CC synth-2D]
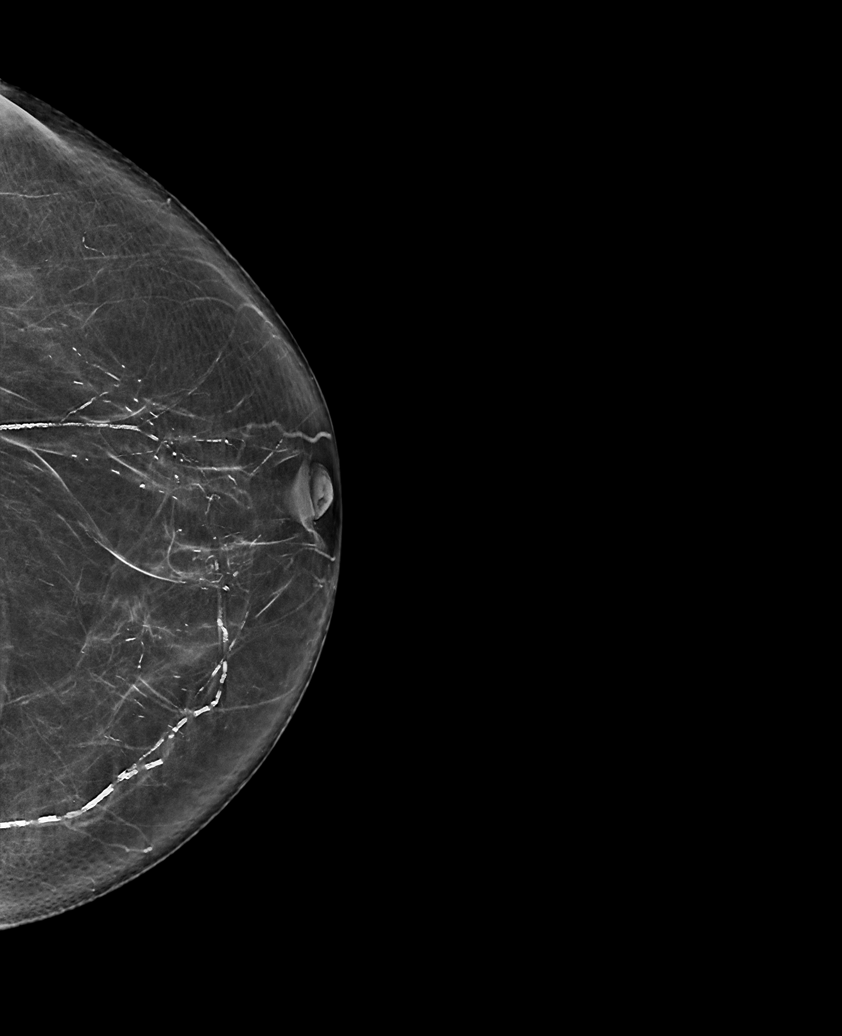

[R CC synth-2D]
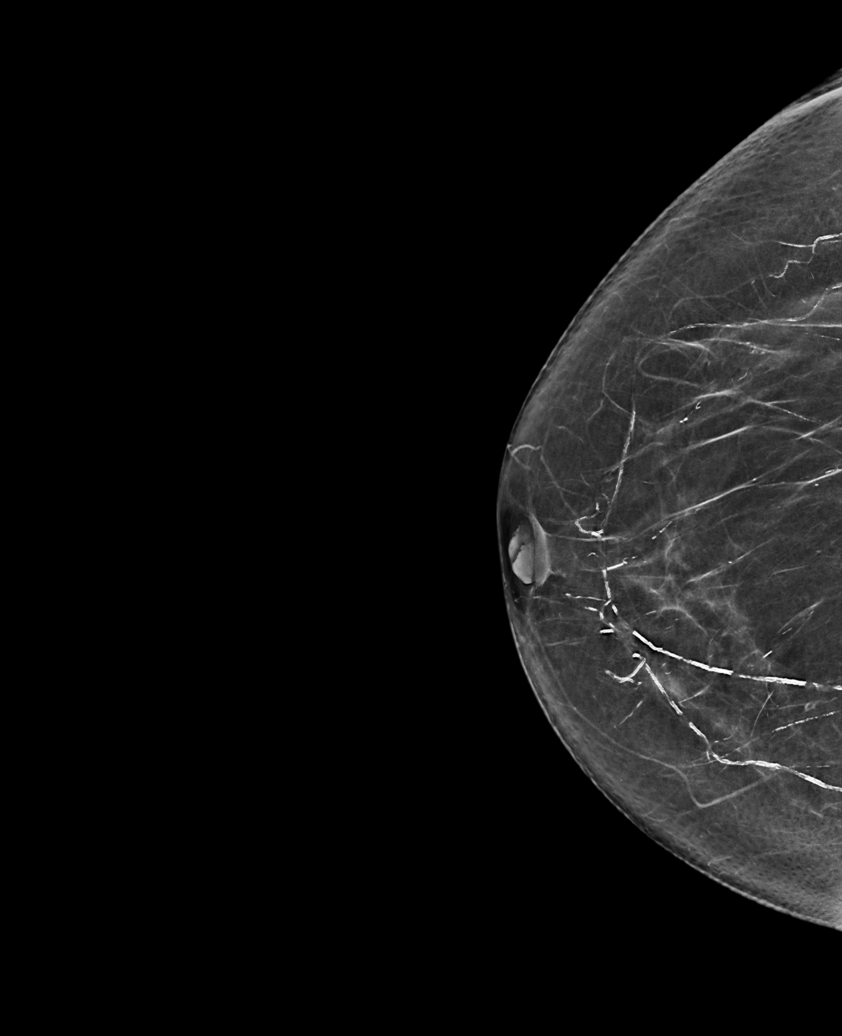

[R MLO synth-2D (2 of 2)]
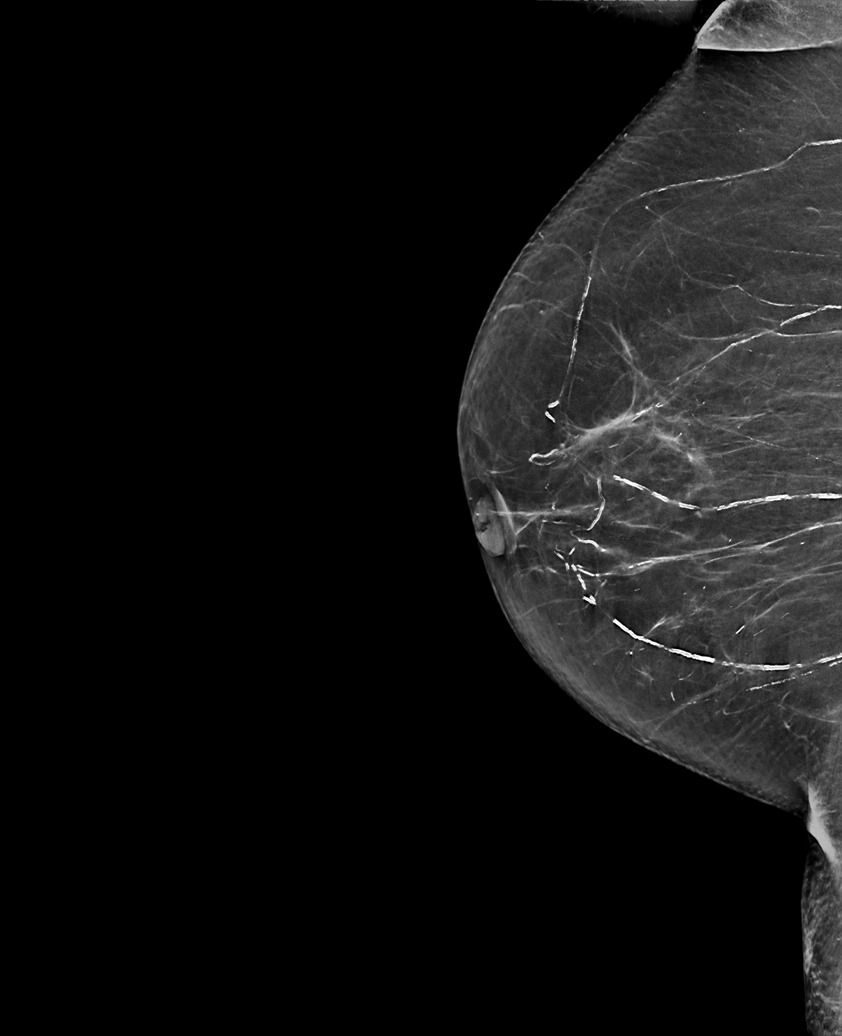

[6 of 36 positions shown; findings below may reference images not displayed]

ACR Breast Density Category b: There are scattered areas of
fibroglandular density.
FINDINGS: There are no findings suspicious for malignancy.
IMPRESSION: No mammographic evidence of malignancy. A result letter of this
screening mammogram will be mailed directly to the patient.

RECOMMENDATION:
Screening mammogram in one year. (Code:51-O-LD2)

BI-RADS CATEGORY  1: Negative.

## 2024-04-14 ENCOUNTER — Other Ambulatory Visit: Payer: Self-pay

## 2024-05-04 ENCOUNTER — Ambulatory Visit: Payer: Self-pay | Admitting: Family Medicine

## 2024-06-28 ENCOUNTER — Other Ambulatory Visit: Payer: Self-pay | Admitting: Family Medicine

## 2024-06-28 ENCOUNTER — Other Ambulatory Visit: Payer: Self-pay

## 2024-06-28 DIAGNOSIS — I10 Essential (primary) hypertension: Secondary | ICD-10-CM

## 2024-06-28 DIAGNOSIS — E785 Hyperlipidemia, unspecified: Secondary | ICD-10-CM

## 2024-06-30 ENCOUNTER — Other Ambulatory Visit: Payer: Self-pay

## 2024-07-02 ENCOUNTER — Other Ambulatory Visit: Payer: Self-pay

## 2024-07-09 ENCOUNTER — Other Ambulatory Visit: Payer: Self-pay | Admitting: Family Medicine

## 2024-07-09 ENCOUNTER — Other Ambulatory Visit: Payer: Self-pay

## 2024-07-09 DIAGNOSIS — E785 Hyperlipidemia, unspecified: Secondary | ICD-10-CM

## 2024-07-09 DIAGNOSIS — I10 Essential (primary) hypertension: Secondary | ICD-10-CM

## 2024-07-09 MED ORDER — ROSUVASTATIN CALCIUM 20 MG PO TABS
20.0000 mg | ORAL_TABLET | Freq: Every day | ORAL | 0 refills | Status: DC
  Filled 2024-07-09: qty 45, 45d supply, fill #0

## 2024-07-09 MED ORDER — LISINOPRIL-HYDROCHLOROTHIAZIDE 20-25 MG PO TABS
1.0000 | ORAL_TABLET | Freq: Every day | ORAL | 0 refills | Status: DC
  Filled 2024-07-09: qty 45, 45d supply, fill #0

## 2024-07-09 NOTE — Telephone Encounter (Signed)
 Requested Prescriptions  Pending Prescriptions Disp Refills   lisinopril -hydrochlorothiazide  (ZESTORETIC ) 20-25 MG tablet 45 tablet 0    Sig: Take 1 tablet by mouth daily.     Cardiovascular:  ACEI + Diuretic Combos Failed - 07/09/2024  4:25 PM      Failed - Na in normal range and within 180 days    Sodium  Date Value Ref Range Status  11/05/2023 143 134 - 144 mmol/L Final         Failed - K in normal range and within 180 days    Potassium  Date Value Ref Range Status  11/05/2023 4.0 3.5 - 5.2 mmol/L Final         Failed - Cr in normal range and within 180 days    Creat  Date Value Ref Range Status  12/13/2014 0.51 0.50 - 1.10 mg/dL Final   Creatinine, Ser  Date Value Ref Range Status  11/05/2023 0.71 0.57 - 1.00 mg/dL Final         Failed - eGFR is 30 or above and within 180 days    GFR, Est African American  Date Value Ref Range Status  12/13/2014 >89 mL/min Final   GFR calc Af Amer  Date Value Ref Range Status  01/24/2021 96 >59 mL/min/1.73 Final    Comment:    **In accordance with recommendations from the NKF-ASN Task force,**   [facility] is in the process of updating its eGFR calculation to the   2021 CKD-EPI creatinine equation that estimates kidney function   without a race variable.    GFR, Est Non African American  Date Value Ref Range Status  12/13/2014 >89 mL/min Final    Comment:      The estimated GFR is a calculation valid for adults (>=69 years old) that uses the CKD-EPI algorithm to adjust for age and sex. It is   not to be used for children, pregnant women, hospitalized patients,    patients on dialysis, or with rapidly changing kidney function. According to the NKDEP, eGFR >89 is normal, 60-89 shows mild impairment, 30-59 shows moderate impairment, 15-29 shows severe impairment and <15 is ESRD.      GFR, Estimated  Date Value Ref Range Status  01/12/2023 >60 >60 mL/min Final    Comment:    (NOTE) Calculated using the CKD-EPI Creatinine  Equation (2021)    eGFR  Date Value Ref Range Status  11/05/2023 89 >59 mL/min/1.73 Final         Failed - Valid encounter within last 6 months    Recent Outpatient Visits           8 months ago Anxiety   [facility] Of [place]. [facility] Delbert Clam, MD   1 year ago Traumatic closed displaced fracture of left shoulder with anterior dislocation with routine healing, subsequent encounter   [facility] - A Dept Of [place]. [facility] Delbert Clam, MD   1 year ago Atrial fibrillation with [facility])   [facility] Of [place]. [facility] Delbert Clam, MD   1 year ago Arthralgia, unspecified joint   [facility] Celestia Rosaline SQUIBB, NP   2 years ago Acute bacterial bronchitis   [facility], [place], [place]              Passed - Patient is not  pregnant      Passed - Last BP in normal range    BP Readings from Last 1 Encounters:  11/05/23 131/79          rosuvastatin  (CRESTOR ) 20 MG tablet 45 tablet 0    Sig: Take 1 tablet (20 mg total) by mouth daily.     Cardiovascular:  Antilipid - Statins 2 Failed - 07/09/2024  4:25 PM      Failed - Lipid Panel in normal range within the last 12 months    Cholesterol, Total  Date Value Ref Range Status  05/06/2023 186 100 - 199 mg/dL Final   LDL Chol Calc (NIH)  Date Value Ref Range Status  05/06/2023 98 0 - 99 mg/dL Final   HDL  Date Value Ref Range Status  05/06/2023 71 >39 mg/dL Final   Triglycerides  Date Value Ref Range Status  05/06/2023 97 0 - 149 mg/dL Final         Passed - Cr in normal range and within 360 days    Creat  Date Value Ref Range Status  12/13/2014 0.51 0.50 - 1.10 mg/dL Final   Creatinine, Ser  Date Value Ref Range Status  11/05/2023 0.71 0.57 - 1.00 mg/dL Final         Passed - Patient is not pregnant       Passed - Valid encounter within last 12 months    Recent Outpatient Visits           8 months ago Anxiety   [facility] Dept Of [place]. [facility] Delbert Clam, MD   1 year ago Traumatic closed displaced fracture of left shoulder with anterior dislocation with routine healing, subsequent encounter   [facility] - [facility] Dept Of [place]. [facility] Delbert Clam, MD   1 year ago Atrial fibrillation with [facility])   [facility] - [facility] Dept Of [place]. [facility] Delbert Clam, MD   1 year ago Arthralgia, unspecified joint   [place] Celestia Rosaline SQUIBB, NP   2 years ago Acute bacterial bronchitis   [facility], Cari S, PA-C               Courtesy refill. Patient will need an office visit for additional refills.

## 2024-07-09 NOTE — Telephone Encounter (Signed)
 Copied from CRM [id number]. Topic: Clinical - Medication Refill >> Jul 09, 2024  3:58 PM Selinda RAMAN wrote: Medication: lisinopril -hydrochlorothiazide  (ZESTORETIC ) 20-25 MG tablet, rosuvastatin  (CRESTOR ) 20 MG tablet  Has the patient contacted their pharmacy? Yes   This is the patient's preferred pharmacy:  [facility] [address] [place] [address] Phone: [phone] Fax: [phone]      Is this the correct pharmacy for this prescription? Yes If no, delete pharmacy and type the correct one.   Has the prescription been filled recently? No  Is the patient out of the medication? Yes and she has been for a week  Has the patient been seen for an appointment in the last year OR does the patient have an upcoming appointment? Yes  Can we respond through MyChart? No  The patient was told she needs to make an appointment as she no showed an appointment in May but has had trauma in the family recently. She has been out of her meds a week and really needs these filled at least enough to get her to her appt on August 21st. She is also on the wait list.

## 2024-07-13 ENCOUNTER — Other Ambulatory Visit: Payer: Self-pay

## 2024-08-11 ENCOUNTER — Telehealth: Payer: Self-pay | Admitting: Family Medicine

## 2024-08-11 NOTE — Telephone Encounter (Signed)
 pt unconfirmed appt (per volunteer nolvm)

## 2024-08-12 ENCOUNTER — Encounter: Payer: Self-pay | Admitting: Family Medicine

## 2024-08-12 ENCOUNTER — Ambulatory Visit: Payer: Self-pay | Attending: Family Medicine | Admitting: Family Medicine

## 2024-08-12 ENCOUNTER — Other Ambulatory Visit: Payer: Self-pay

## 2024-08-12 VITALS — BP 137/79 | HR 67 | Ht 62.0 in | Wt 182.4 lb

## 2024-08-12 DIAGNOSIS — I10 Essential (primary) hypertension: Secondary | ICD-10-CM

## 2024-08-12 DIAGNOSIS — M25512 Pain in left shoulder: Secondary | ICD-10-CM

## 2024-08-12 DIAGNOSIS — D179 Benign lipomatous neoplasm, unspecified: Secondary | ICD-10-CM

## 2024-08-12 DIAGNOSIS — R5383 Other fatigue: Secondary | ICD-10-CM

## 2024-08-12 DIAGNOSIS — E785 Hyperlipidemia, unspecified: Secondary | ICD-10-CM

## 2024-08-12 DIAGNOSIS — R7303 Prediabetes: Secondary | ICD-10-CM

## 2024-08-12 DIAGNOSIS — F419 Anxiety disorder, unspecified: Secondary | ICD-10-CM

## 2024-08-12 DIAGNOSIS — G8929 Other chronic pain: Secondary | ICD-10-CM

## 2024-08-12 MED ORDER — DICLOFENAC SODIUM 1 % EX GEL
4.0000 g | Freq: Four times a day (QID) | CUTANEOUS | 1 refills | Status: AC
  Filled 2024-08-12: qty 100, 8d supply, fill #0
  Filled 2024-09-23: qty 100, 8d supply, fill #1

## 2024-08-12 MED ORDER — LISINOPRIL-HYDROCHLOROTHIAZIDE 20-25 MG PO TABS
1.0000 | ORAL_TABLET | Freq: Every day | ORAL | 0 refills | Status: DC
  Filled 2024-08-12 – 2024-09-23 (×2): qty 45, 45d supply, fill #0

## 2024-08-12 MED ORDER — ROSUVASTATIN CALCIUM 20 MG PO TABS
20.0000 mg | ORAL_TABLET | Freq: Every day | ORAL | 0 refills | Status: DC
  Filled 2024-08-12 – 2024-09-23 (×2): qty 45, 45d supply, fill #0

## 2024-08-12 MED ORDER — METOPROLOL SUCCINATE ER 25 MG PO TB24
12.5000 mg | ORAL_TABLET | Freq: Every day | ORAL | 1 refills | Status: AC
  Filled 2024-08-12: qty 180, 360d supply, fill #0
  Filled 2024-12-27: qty 27, 54d supply, fill #0

## 2024-08-12 NOTE — Progress Notes (Signed)
 Subjective:  Patient ID: [name] [name], female    DOB: September 29, 1951  Age: 73 y.o. MRN: [id number]  CC: Medical Management of Chronic Issues (Right arm pain/Knot and pain on ride side)     Discussed the use of AI scribe software for clinical note transcription with the patient, who gave verbal consent to proceed.  History of Present Illness [name] [name] is a 73 year old female with  a history of hypertension, hyperlipidemia, A-fib, fracture of left shoulder with anterior dislocation, Prediabetes. and a knot on her right arm. She is accompanied by her daughter, who is concerned about her mother's health.  She experiences severe pain in her right arm, extending from the neck to the right elbow, for two weeks. The pain disrupts her sleep and occurs without specific triggers. Numbness is present in her right hand. A long-standing knot on her right side remains stable.  Her daughter observes symptoms of low energy, overeating, and sleep disturbances, suggesting possible depression, especially after a recent illness in [place] and family stressors while in [place].  Daughter states patient has been worrying about this a lot.  On inquiry from the patient she denies being depressed or having anxiety symptoms.  Denies suicidal ideations or intent.  She is on lisinopril , hydrochlorothiazide , metoprolol , and Crestor  but did not take her medications this morning.    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  Social History   Socioeconomic History   Marital status: Married    Spouse name: Not on file   Number of children: Not on file   Years of education: Not on file   Highest education level: Not on file  Occupational History   Not on file  Tobacco Use   Smoking status: Never   Smokeless tobacco: Never  Vaping Use   Vaping status: Never Used  Substance and Sexual Activity   Alcohol use: No    Alcohol/week: 0.0 standard drinks of alcohol   Drug use: No   Sexual activity: Not Currently  Other Topics Concern   Not on file  Social History Narrative   Originally from [place].   [place]ame [place]  In [place]   Lives with her husband, her widowed daughter, and her daughter's 3 children.   This daughter's husband was tortured and killed by her other daughter's husband when he tried to help [place]   Social Drivers of Health   Financial Resource Strain: Low Risk  (11/05/2023)   Overall Financial Resource Strain (CARDIA)    Difficulty of Paying Living Expenses: Not hard at all  Food Insecurity: Food Insecurity Present (11/05/2023)   Hunger Vital Sign    Worried About Programme researcher, broadcasting/film/video  in the Last Year: Never true    Ran Out of Food in the Last Year: Sometimes true  Transportation Needs: No Transportation Needs (11/05/2023)   PRAPARE - Administrator, Civil Service (Medical): No    Lack of Transportation (Non-Medical): No  Physical Activity: Insufficiently Active (11/05/2023)   Exercise Vital Sign    Days of Exercise per Week: 3 days    Minutes of Exercise per Session: 30 min  Stress: No Stress Concern Present (11/05/2023)   [employer] of Occupational Health - Occupational Stress Questionnaire    Feeling of Stress : Not at all  Social  Connections: Moderately Integrated (11/05/2023)   Social Connection and Isolation Panel    Frequency of Communication with Friends and Family: Twice a week    Frequency of Social Gatherings with Friends and Family: Twice a week    Attends Religious Services: 1 to 4 times per year    Active Member of [employer] or Organizations: No    Attends Engineer, structural: Never    Marital Status: Married    No Known Allergies  Outpatient Medications Prior to Visit  Medication Sig Dispense Refill   acetaminophen  (TYLENOL ) 500 MG tablet Take 1,000 mg by mouth every 6 (six) hours as needed for mild pain or headache.     aspirin  EC 81 MG tablet Take 1 tablet (81 mg total) by mouth daily. Swallow whole. 30 tablet 12   ibuprofen  (ADVIL ) 600 MG tablet Take 1 tablet (600 mg total) by mouth every 8 (eight) hours as needed (pain). 30 tablet 1   diclofenac  Sodium (VOLTAREN ) 1 % GEL Apply 2 g topically 4 (four) times daily. 350 g 1   lisinopril -hydrochlorothiazide  (ZESTORETIC ) 20-25 MG tablet Take 1 tablet by mouth daily. 45 tablet 0   metoprolol  succinate (TOPROL -XL) 25 MG 24 hr tablet Take 0.5 tablets (12.5 mg total) by mouth daily. 180 tablet 1   rosuvastatin  (CRESTOR ) 20 MG tablet Take 1 tablet (20 mg total) by mouth daily. 45 tablet 0   guaiFENesin -codeine  (CHERATUSSIN AC) 100-10 MG/5ML syrup Take 5 mLs by mouth 4 (four) times daily as needed for cough. (Patient not taking: Reported on 08/12/2024) 120 mL 0   No facility-administered medications prior to visit.     ROS Review of Systems  Constitutional:  Positive for fatigue. Negative for activity change and appetite change.  HENT:  Negative for sinus pressure and sore throat.   Respiratory:  Negative for chest tightness, shortness of breath and wheezing.   Cardiovascular:  Negative for chest pain and palpitations.  Gastrointestinal:  Negative for abdominal distention, abdominal pain and constipation.  Genitourinary: Negative.   Musculoskeletal:         See HPI  Psychiatric/Behavioral:  Positive for sleep disturbance. Negative for behavioral problems and dysphoric mood.     Objective:  BP 137/79   Pulse 67   Ht 5' 2 (1.575 m)   Wt 182 lb 6.4 oz (82.7 kg)   SpO2 97%   BMI 33.36 kg/m      08/12/2024    8:43 AM 11/05/2023    1:42 PM 05/05/2023    1:55 PM  BP/Weight  Systolic BP 137 131 112  Diastolic BP 79 79 72  Wt. (Lbs) 182.4 185 187  BMI 33.36 kg/m2 33.84 kg/m2 34.2 kg/m2      Physical Exam Constitutional:      Appearance: She is well-developed.  Cardiovascular:     Rate and Rhythm: Normal rate.  Heart sounds: Normal heart sounds. No murmur heard. Pulmonary:     Effort: Pulmonary effort is normal.     Breath sounds: Normal breath sounds. No wheezing or rales.  Chest:     Chest wall: No tenderness.  Abdominal:     General: Bowel sounds are normal. There is no distension.     Palpations: Abdomen is soft. There is no mass.     Tenderness: There is no abdominal tenderness.  Musculoskeletal:     Right lower leg: No edema.     Left lower leg: No edema.     Comments: Slight tenderness on palpation of right trapezius muscle, right biceps but absent on the left  Skin:    Comments: Soft fluctuant lesion inferior to the right axilla.  Not tender  Neurological:     Mental Status: She is alert and oriented to person, place, and time.  Psychiatric:        Mood and Affect: Mood normal.        Latest Ref Rng & Units 11/05/2023    2:04 PM 05/06/2023    2:29 PM 01/12/2023    5:42 PM  CMP  Glucose 70 - 99 mg/dL 91  97  877   BUN 8 - 27 mg/dL 20  20  20    Creatinine 0.57 - 1.00 mg/dL 9.28  9.41  9.42   Sodium 134 - 144 mmol/L 143  140  136   Potassium 3.5 - 5.2 mmol/L 4.0  4.1  3.7   Chloride 96 - 106 mmol/L 102  104  106   CO2 20 - 29 mmol/L 20  19  20    Calcium  8.7 - 10.3 mg/dL 89.7  9.7  9.1   Total Protein 6.0 - 8.5 g/dL  6.9    Total Bilirubin 0.0 - 1.2 mg/dL  0.3    Alkaline Phos 44 - 121 IU/L  86     AST 0 - 40 IU/L  30    ALT 0 - 32 IU/L  25      Lipid Panel     Component Value Date/Time   CHOL 186 05/06/2023 1432   TRIG 97 05/06/2023 1432   HDL 71 05/06/2023 1432   CHOLHDL 3.7 09/04/2021 1335   CHOLHDL 3.4 02/10/2014 1527   VLDL 17 02/10/2014 1527   LDLCALC 98 05/06/2023 1432    CBC    Component Value Date/Time   WBC 4.6 05/06/2023 1427   WBC 10.5 01/12/2023 1742   RBC 4.13 05/06/2023 1427   RBC 3.91 01/12/2023 1742   HGB 13.0 05/06/2023 1427   HCT 38.6 05/06/2023 1427   PLT 282 05/06/2023 1427   MCV 94 05/06/2023 1427   MCH 31.5 05/06/2023 1427   MCH 32.0 01/12/2023 1742   MCHC 33.7 05/06/2023 1427   MCHC 33.2 01/12/2023 1742   RDW 12.4 05/06/2023 1427   LYMPHSABS 1.4 05/06/2023 1427   MONOABS 0.7 09/30/2019 2358   EOSABS 0.2 05/06/2023 1427   BASOSABS 0.0 05/06/2023 1427    Lab Results  Component Value Date   HGBA1C 5.7 (H) 11/05/2023      08/12/2024    8:45 AM 11/05/2023    1:51 PM 05/05/2023    1:56 PM 02/19/2023   10:27 AM 09/04/2022    1:58 PM  Depression screen PHQ 2/9  Decreased Interest 1 0 0 0 0  Down, Depressed, Hopeless 1 0 0 0 0  PHQ - 2 Score 2 0 0 0 0  Altered sleeping 1  0 0 0 0  Tired, decreased energy 2 0 0 0 1  Change in appetite 1 0 0 0 0  Feeling bad or failure about yourself  1 0 0 0 0  Trouble concentrating 0 0 0 0 0  Moving slowly or fidgety/restless 1 0 0 0 0  Suicidal thoughts 0 0 0 0 0  PHQ-9 Score 8 0 0 0 1  Difficult doing work/chores    Not difficult at all        Assessment and Plan Assessment & Plan Right arm pain Pain from neck to right elbow with numbness in right hand, possible scoliosis noted. - Prescribe Voltaren  gel for right shoulder and neck pain. - Recommend heat application for pain relief. - Refer to physical therapy for exercises and massage.  Lipoma of right upper arm Identified as a fatty tissue mass, no immediate concern for malignancy. - Observe for changes.  Essential hypertension Blood  pressure well-controlled with current medication regimen. - Continue Lisinopril -hydrochlorothiazide  and Metoprolol .  Hyperlipidemia Cholesterol levels to be monitored with upcoming blood tests. - Continue Rosuvastatin . - Order blood tests to check cholesterol levels.  Prediabetes Previous diagnosis with A1c to be re-evaluated. - Order blood tests to check A1c levels. -Last A1c was 5.7  Anxiety Concerns about anxiety and possible depression, but the patient anxiety or depression, no suicidal ideation, not open to therapy or medication currently. - Advised daughter to have a conversation with her mother at home about her symptoms as this morning is the first time the patient admitted to such symptoms on the PHQ-9.. - Discuss potential for counseling or medication if she becomes open to it in the future.    Healthcare maintenance Colonoscopy and mammogram discontinued due to age  Meds ordered this encounter  Medications   lisinopril -hydrochlorothiazide  (ZESTORETIC ) 20-25 MG tablet    Sig: Take 1 tablet by mouth daily.    Dispense:  45 tablet    Refill:  0    Courtesy refill. Patient will need an office visit for additional refills.   metoprolol  succinate (TOPROL -XL) 25 MG 24 hr tablet    Sig: Take 0.5 tablets (12.5 mg total) by mouth daily.    Dispense:  180 tablet    Refill:  1   rosuvastatin  (CRESTOR ) 20 MG tablet    Sig: Take 1 tablet (20 mg total) by mouth daily.    Dispense:  45 tablet    Refill:  0    Courtesy refill. Patient will need an office visit for additional refills.   diclofenac  Sodium (VOLTAREN ) 1 % GEL    Sig: Apply 4 grams topically 4 (four) times daily.    Dispense:  100 g    Refill:  1    Follow-up: Return in about 6 months (around 02/12/2025) for Chronic medical conditions.       Corrina Sabin, MD, FAAFP. [facility] [place], [place] [phone]   08/12/2024, 12:56 PM

## 2024-08-12 NOTE — Patient Instructions (Addendum)
 VISIT SUMMARY:  Today, you visited the clinic due to severe pain in your right arm, extending from your neck to your elbow, and numbness in your right hand. We also discussed your long-standing knot on your right arm, your blood pressure, cholesterol levels, and concerns about anxiety and possible depression.  YOUR PLAN:  -RIGHT ARM PAIN: You have pain from your neck to your right elbow, with numbness in your right hand. This may be related to scoliosis. We have prescribed Voltaren  gel for the pain, recommended applying heat, and referred you to physical therapy for exercises and massage.  -LIPOMA OF RIGHT UPPER ARM: A lipoma is a non-cancerous lump of fatty tissue. We will continue to observe it for any changes.  -ESSENTIAL HYPERTENSION: Your blood pressure is well-controlled with your current medications. Please continue taking Lisinopril -hydrochlorothiazide  and Metoprolol  as prescribed.  -HYPERLIPIDEMIA: Hyperlipidemia means you have high cholesterol levels. We will monitor your cholesterol with upcoming blood tests. Please continue taking Rosuvastatin  as prescribed.  -PREDIABETES: Prediabetes means your blood sugar levels are higher than normal but not high enough to be classified as diabetes. We will re-evaluate your A1c levels with a blood test.  -ANXIETY: We discussed concerns about anxiety and possible depression. Although you are not currently open to therapy or medication, we have documented this in your medical history and can revisit it in the future if you change your mind.  INSTRUCTIONS:  Please follow up with the blood tests to check your cholesterol and A1c levels. Additionally, attend the physical therapy sessions as referred. Continue taking your prescribed medications regularly.    If you would like to receive your shingles vaccine please visit your local HEALTH DEPARTMENT.   [facility] [place]:Address: [address] [place], [place] [address] Hours:   Thursday 8?AM-5?PM Friday 8?AM-5?PM Saturday Closed Sunday Closed Monday 8?AM-5?PM Tuesday 8?AM-5?PM Wednesday 8?AM-5?PM Phone: [phone]

## 2024-08-13 ENCOUNTER — Other Ambulatory Visit: Payer: Self-pay

## 2024-08-13 ENCOUNTER — Ambulatory Visit: Payer: Self-pay | Admitting: Family Medicine

## 2024-08-13 LAB — CBC WITH DIFFERENTIAL/PLATELET
Basophils Absolute: 0 x10E3/uL (ref 0.0–0.2)
Basos: 1 %
EOS (ABSOLUTE): 0.2 x10E3/uL (ref 0.0–0.4)
Eos: 3 %
Hematocrit: 41.4 % (ref 34.0–46.6)
Hemoglobin: 13.7 g/dL (ref 11.1–15.9)
Immature Grans (Abs): 0 x10E3/uL (ref 0.0–0.1)
Immature Granulocytes: 0 %
Lymphocytes Absolute: 1.5 x10E3/uL (ref 0.7–3.1)
Lymphs: 26 %
MCH: 31.7 pg (ref 26.6–33.0)
MCHC: 33.1 g/dL (ref 31.5–35.7)
MCV: 96 fL (ref 79–97)
Monocytes Absolute: 0.4 x10E3/uL (ref 0.1–0.9)
Monocytes: 7 %
Neutrophils Absolute: 3.6 x10E3/uL (ref 1.4–7.0)
Neutrophils: 63 %
Platelets: 252 x10E3/uL (ref 150–450)
RBC: 4.32 x10E6/uL (ref 3.77–5.28)
RDW: 12.1 % (ref 11.7–15.4)
WBC: 5.7 x10E3/uL (ref 3.4–10.8)

## 2024-08-13 LAB — CMP14+EGFR
ALT: 18 IU/L (ref 0–32)
AST: 23 IU/L (ref 0–40)
Albumin: 4.1 g/dL (ref 3.8–4.8)
Alkaline Phosphatase: 93 IU/L (ref 44–121)
BUN/Creatinine Ratio: 23 (ref 12–28)
BUN: 13 mg/dL (ref 8–27)
Bilirubin Total: 0.5 mg/dL (ref 0.0–1.2)
CO2: 20 mmol/L (ref 20–29)
Calcium: 9.6 mg/dL (ref 8.7–10.3)
Chloride: 106 mmol/L (ref 96–106)
Creatinine, Ser: 0.56 mg/dL — ABNORMAL LOW (ref 0.57–1.00)
Globulin, Total: 2.7 g/dL (ref 1.5–4.5)
Glucose: 94 mg/dL (ref 70–99)
Potassium: 4 mmol/L (ref 3.5–5.2)
Sodium: 141 mmol/L (ref 134–144)
Total Protein: 6.8 g/dL (ref 6.0–8.5)
eGFR: 95 mL/min/1.73 (ref >59)

## 2024-08-13 LAB — LP+NON-HDL CHOLESTEROL
Cholesterol, Total: 159 mg/dL (ref 100–199)
HDL: 61 mg/dL (ref >39)
LDL Chol Calc (NIH): 80 mg/dL (ref 0–99)
Total Non-HDL-Chol (LDL+VLDL): 98 mg/dL (ref 0–129)
Triglycerides: 102 mg/dL (ref 0–149)
VLDL Cholesterol Cal: 18 mg/dL (ref 5–40)

## 2024-08-13 LAB — TSH: TSH: 1.53 u[IU]/mL (ref 0.450–4.500)

## 2024-08-13 LAB — HEMOGLOBIN A1C
Est. average glucose Bld gHb Est-mCnc: 117 mg/dL
Hgb A1c MFr Bld: 5.7 % — ABNORMAL HIGH (ref 4.8–5.6)

## 2024-08-13 LAB — T4, FREE: Free T4: 1.11 ng/dL (ref 0.82–1.77)

## 2024-08-13 LAB — VITAMIN D 25 HYDROXY (VIT D DEFICIENCY, FRACTURES): Vit D, 25-Hydroxy: 27.3 ng/mL — ABNORMAL LOW (ref 30.0–100.0)

## 2024-08-13 MED ORDER — ERGOCALCIFEROL 1.25 MG (50000 UT) PO CAPS
50000.0000 [IU] | ORAL_CAPSULE | ORAL | 1 refills | Status: AC
  Filled 2024-08-13: qty 12, 84d supply, fill #0
  Filled 2024-12-27: qty 12, 84d supply, fill #1

## 2024-08-25 ENCOUNTER — Other Ambulatory Visit: Payer: Self-pay

## 2024-09-23 ENCOUNTER — Other Ambulatory Visit: Payer: Self-pay

## 2024-09-24 ENCOUNTER — Other Ambulatory Visit: Payer: Self-pay

## 2024-09-26 ENCOUNTER — Emergency Department (HOSPITAL_COMMUNITY): Payer: Self-pay

## 2024-09-26 ENCOUNTER — Encounter (HOSPITAL_COMMUNITY): Payer: Self-pay

## 2024-09-26 ENCOUNTER — Other Ambulatory Visit: Payer: Self-pay

## 2024-09-26 ENCOUNTER — Emergency Department (HOSPITAL_COMMUNITY)
Admission: EM | Admit: 2024-09-26 | Discharge: 2024-09-26 | Disposition: A | Discharge status: Home / Self Care | Payer: Self-pay | Attending: Emergency Medicine | Admitting: Emergency Medicine

## 2024-09-26 DIAGNOSIS — Z7982 Long term (current) use of aspirin: Secondary | ICD-10-CM | POA: Insufficient documentation

## 2024-09-26 DIAGNOSIS — S43004A Unspecified dislocation of right shoulder joint, initial encounter: Secondary | ICD-10-CM

## 2024-09-26 DIAGNOSIS — I10 Essential (primary) hypertension: Secondary | ICD-10-CM | POA: Insufficient documentation

## 2024-09-26 DIAGNOSIS — Z79899 Other long term (current) drug therapy: Secondary | ICD-10-CM | POA: Insufficient documentation

## 2024-09-26 DIAGNOSIS — W108XXA Fall (on) (from) other stairs and steps, initial encounter: Secondary | ICD-10-CM | POA: Insufficient documentation

## 2024-09-26 DIAGNOSIS — S43014A Anterior dislocation of right humerus, initial encounter: Secondary | ICD-10-CM | POA: Insufficient documentation

## 2024-09-26 LAB — COMPREHENSIVE METABOLIC PANEL WITH GFR
ALT: 19 U/L (ref 0–44)
AST: 27 U/L (ref 15–41)
Albumin: 3.5 g/dL (ref 3.5–5.0)
Alkaline Phosphatase: 73 U/L (ref 38–126)
Anion gap: 15 (ref 5–15)
BUN: 18 mg/dL (ref 8–23)
CO2: 21 mmol/L — ABNORMAL LOW (ref 22–32)
Calcium: 9.1 mg/dL (ref 8.9–10.3)
Chloride: 104 mmol/L (ref 98–111)
Creatinine, Ser: 0.75 mg/dL (ref 0.44–1.00)
GFR, Estimated: >60 mL/min (ref >60)
Glucose, Bld: 146 mg/dL — ABNORMAL HIGH (ref 70–99)
Potassium: 3.3 mmol/L — ABNORMAL LOW (ref 3.5–5.1)
Sodium: 140 mmol/L (ref 135–145)
Total Bilirubin: 0.9 mg/dL (ref 0.0–1.2)
Total Protein: 6.7 g/dL (ref 6.5–8.1)

## 2024-09-26 LAB — CBC
HCT: 38.5 % (ref 36.0–46.0)
Hemoglobin: 12.9 g/dL (ref 12.0–15.0)
MCH: 31.7 pg (ref 26.0–34.0)
MCHC: 33.5 g/dL (ref 30.0–36.0)
MCV: 94.6 fL (ref 80.0–100.0)
Platelets: 281 K/uL (ref 150–400)
RBC: 4.07 MIL/uL (ref 3.87–5.11)
RDW: 13.4 % (ref 11.5–15.5)
WBC: 6.4 K/uL (ref 4.0–10.5)
nRBC: 0 % (ref 0.0–0.2)

## 2024-09-26 LAB — PROTIME-INR
INR: 1 (ref 0.8–1.2)
Prothrombin Time: 13.7 s (ref 11.4–15.2)

## 2024-09-26 LAB — I-STAT CHEM 8, ED
BUN: 20 mg/dL (ref 8–23)
Calcium, Ion: 1.26 mmol/L (ref 1.15–1.40)
Chloride: 106 mmol/L (ref 98–111)
Creatinine, Ser: 0.8 mg/dL (ref 0.44–1.00)
Glucose, Bld: 146 mg/dL — ABNORMAL HIGH (ref 70–99)
HCT: 38 % (ref 36.0–46.0)
Hemoglobin: 12.9 g/dL (ref 12.0–15.0)
Potassium: 3.5 mmol/L (ref 3.5–5.1)
Sodium: 142 mmol/L (ref 135–145)
TCO2: 24 mmol/L (ref 22–32)

## 2024-09-26 LAB — I-STAT CG4 LACTIC ACID, ED: Lactic Acid, Venous: 1.3 mmol/L (ref 0.5–1.9)

## 2024-09-26 MED ORDER — ONDANSETRON HCL 4 MG/2ML IJ SOLN
4.0000 mg | Freq: Once | INTRAMUSCULAR | Status: AC
  Administered 2024-09-26: 4 mg via INTRAVENOUS
  Filled 2024-09-26: qty 2

## 2024-09-26 MED ORDER — OXYCODONE HCL 5 MG PO TABS: 5.0000 mg | ORAL_TABLET | Freq: Four times a day (QID) | ORAL | 0 refills | Status: AC | PRN

## 2024-09-26 MED ORDER — HYDROMORPHONE HCL 1 MG/ML IJ SOLN
1.0000 mg | Freq: Once | INTRAMUSCULAR | Status: AC
Start: 2024-09-26 — End: 2024-09-26
  Administered 2024-09-26: 1 mg via INTRAVENOUS
  Filled 2024-09-26: qty 1

## 2024-09-26 MED ORDER — PROPOFOL 10 MG/ML IV BOLUS
0.5000 mg/kg | Freq: Once | INTRAVENOUS | Status: AC
  Administered 2024-09-26: 41 mg via INTRAVENOUS
  Filled 2024-09-26: qty 20

## 2024-09-26 MED ORDER — HYDROMORPHONE HCL 1 MG/ML IJ SOLN
0.5000 mg | Freq: Once | INTRAMUSCULAR | Status: AC
  Administered 2024-09-26: 0.5 mg via INTRAVENOUS
  Filled 2024-09-26: qty 1

## 2024-09-26 NOTE — Progress Notes (Signed)
 Orthopedic Tech Progress Note Patient Details:  [name] [name] [birth year] [id number]  Ortho Devices Type of Ortho Device: Sling immobilizer Ortho Device/Splint Location: rue Ortho Device/Splint Interventions: Ordered, Application, Adjustment  I applied sling post reduction with rn help. Post Interventions Patient Tolerated: Well Instructions Provided: Care of device, Adjustment of device  Chandra Dorn PARAS 09/26/2024, 9:42 PM

## 2024-09-26 NOTE — ED Notes (Signed)
 Assuming pt care, pt bib ems coming from home for fall at ground level, c/o RT shoulder pain radiating to back, med hx. Afib, takes thinners. Family at bedside call bell within reach

## 2024-09-26 NOTE — Progress Notes (Signed)
 Orthopedic Tech Progress Note Patient Details:  [name] March 28, 1951 [id number]  Patient ID: [name] [name], female   DOB: June 25, 1951, 73 y.o.   MRN: [id number] Level II; not currently needed. Laymon [name] 09/26/2024, 7:12 PM

## 2024-09-26 NOTE — Discharge Instructions (Signed)
 Call the orthopedic doctors office to schedule an appointment .  Keep your arm in the sling until directed by the orthopedic doctor.  Apply ice to help with swelling.  Take tylenol  for pain and reserve the oxycodone  for more severe pain.

## 2024-09-26 NOTE — ED Provider Notes (Signed)
 [place] EMERGENCY DEPARTMENT AT [facility] Provider Note   CSN: [id number] Arrival date & time: 09/26/24  1847     Patient presents with: [name] [name] Ip [name] Kendallyn Lippold is a 73 y.o. female.   HPI   Patient has a history of arthritis osteoporosis hypertension hypercholesterolemia atrial fibrillation.  Patient presents ED for evaluation after a fall.  Patient missed a step tried to catch herself on the way down but ended up ending her shoulder.  Patient denies any headache or head injury.  She is having severe pain in her right shoulder down her arm.  Is also on the back part of her arm.  Patient states she also is having some pain in her back more towards the shoulder area.  Prior to Admission medications   Medication Sig Start Date End Date Taking? Authorizing Provider  oxyCODONE  (ROXICODONE ) 5 MG immediate release tablet Take 1 tablet (5 mg total) by mouth every 6 (six) hours as needed for severe pain (pain score 7-10). 09/26/24  Yes Randol Simmonds, MD  acetaminophen  (TYLENOL ) 500 MG tablet Take 1,000 mg by mouth every 6 (six) hours as needed for mild pain or headache.    [provider]  aspirin  EC 81 MG tablet Take 1 tablet (81 mg total) by mouth daily. Swallow whole. 09/04/22   Newlin, Enobong, MD  diclofenac  Sodium (VOLTAREN ) 1 % GEL Apply 4 grams topically 4 (four) times daily. 08/12/24   Newlin, Enobong, MD  ergocalciferol  (DRISDOL ) 1.25 MG (50000 UT) capsule Take 1 capsule (50,000 Units total) by mouth once a week. 08/13/24   Newlin, Enobong, MD  guaiFENesin -codeine  (CHERATUSSIN AC) 100-10 MG/5ML syrup Take 5 mLs by mouth 4 (four) times daily as needed for cough. Patient not taking: Reported on 08/12/2024 12/10/22   Vonna Sharlet POUR, MD  ibuprofen  (ADVIL ) 600 MG tablet Take 1 tablet (600 mg total) by mouth every 8 (eight) hours as needed (pain). 02/19/23   Mayers, Cari S, PA-C  lisinopril -hydrochlorothiazide  (ZESTORETIC ) 20-25 MG tablet Take 1 tablet by mouth  daily. 08/12/24   Newlin, Enobong, MD  metoprolol  succinate (TOPROL -XL) 25 MG 24 hr tablet Take 0.5 tablets (12.5 mg total) by mouth daily. 08/12/24   Newlin, Enobong, MD  rosuvastatin  (CRESTOR ) 20 MG tablet Take 1 tablet (20 mg total) by mouth daily. 08/12/24   Newlin, Enobong, MD    Allergies: Patient has no known allergies.    Review of Systems  Updated Vital Signs BP (!) 155/105   Pulse 69   Temp 97.6 F (36.4 C) (Oral)   Resp 18   Ht 1.575 m (5' 2)   Wt 82 kg   SpO2 100%   BMI 33.06 kg/m   Physical Exam Vitals and nursing note reviewed.  Constitutional:      General: She is in acute distress.     Appearance: She is well-developed. She is ill-appearing.  HENT:     Head: Normocephalic and atraumatic.     Right Ear: External ear normal.     Left Ear: External ear normal.  Eyes:     General: No scleral icterus.       Right eye: No discharge.        Left eye: No discharge.     Conjunctiva/sclera: Conjunctivae normal.  Neck:     Trachea: No tracheal deviation.  Cardiovascular:     Rate and Rhythm: Normal rate and regular rhythm.  Pulmonary:     Effort: Pulmonary effort is normal. No respiratory distress.  Breath sounds: Normal breath sounds. No stridor. No wheezing or rales.  Abdominal:     General: Bowel sounds are normal. There is no distension.     Palpations: Abdomen is soft.     Tenderness: There is no abdominal tenderness. There is no guarding or rebound.  Musculoskeletal:        General: No deformity.     Right shoulder: Tenderness present.     Right upper arm: Tenderness present.     Right forearm: Tenderness present.     Cervical back: Neck supple. No tenderness.     Thoracic back: No tenderness.     Lumbar back: No tenderness.  Skin:    General: Skin is warm and dry.     Findings: No rash.  Neurological:     General: No focal deficit present.     Mental Status: She is alert.     Cranial Nerves: No cranial nerve deficit, dysarthria or facial  asymmetry.     Sensory: No sensory deficit.     Motor: No abnormal muscle tone or seizure activity.     Coordination: Coordination normal.  Psychiatric:        Mood and Affect: Mood normal.     (all labs ordered are listed, but only abnormal results are displayed) Labs Reviewed  COMPREHENSIVE METABOLIC PANEL WITH GFR - Abnormal; Notable for the following components:      Result Value   Potassium 3.3 (*)    CO2 21 (*)    Glucose, Bld 146 (*)    All other components within normal limits  I-STAT CHEM 8, ED - Abnormal; Notable for the following components:   Glucose, Bld 146 (*)    All other components within normal limits  CBC  PROTIME-INR  I-STAT CG4 LACTIC ACID, ED  SAMPLE TO BLOOD BANK    EKG: EKG Interpretation Date/Time:  Sunday September 26 2024 19:24:30 EDT Ventricular Rate:  65 PR Interval:    QRS Duration:  137 QT Interval:  403 QTC Calculation: 419 R Axis:   23  Text Interpretation: Atrial fibrillation Left bundle branch block Confirmed by Randol Simmonds [phone]) on 09/26/2024 7:47:28 PM  Radiology: [facility]COLA Humerus Right Result Date: 09/26/2024 CLINICAL DATA:  fall, pain EXAM: RIGHT FOREARM - 2 VIEW; RIGHT HUMERUS - 2+ VIEW COMPARISON:  X-ray right shoulder 10/16/2024 FINDINGS: Anterior shoulder dislocation again noted. There is no evidence of acute displaced fracture or other focal bone lesions of the humerus or bones of the forearm. Wrist is grossly unremarkable. soft tissues are unremarkable. IMPRESSION: No acute displaced fracture of the right humerus or bones of the right forearm. Anterior shoulder dislocation. Electronically Signed   By: Morgane  Naveau M.D.   On: 09/26/2024 20:40   DG Forearm Right Result Date: 09/26/2024 CLINICAL DATA:  fall, pain EXAM: RIGHT FOREARM - 2 VIEW; RIGHT HUMERUS - 2+ VIEW COMPARISON:  X-ray right shoulder 10/16/2024 FINDINGS: Anterior shoulder dislocation again noted. There is no evidence of acute displaced fracture or other focal bone lesions  of the humerus or bones of the forearm. Wrist is grossly unremarkable. soft tissues are unremarkable. IMPRESSION: No acute displaced fracture of the right humerus or bones of the right forearm. Anterior shoulder dislocation. Electronically Signed   By: Morgane  Naveau M.D.   On: 09/26/2024 20:40   DG Shoulder Right Result Date: 09/26/2024 CLINICAL DATA:  fall, pain EXAM: RIGHT SHOULDER - 2+ VIEW COMPARISON:  None Available. FINDINGS: Anterior right shoulder dislocation. No definite acute displaced fracture. 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 Mild moderate degenerative changes of the shoulder. Soft tissues are unremarkable. IMPRESSION: Anterior right shoulder dislocation. Electronically Signed   By: Morgane  Naveau M.D.   On: 09/26/2024 20:38   CT HEAD WO CONTRAST Result Date: 09/26/2024 CLINICAL DATA:  Head trauma, moderate-severe; Polytrauma, blunt EXAM: CT HEAD WITHOUT CONTRAST CT CERVICAL SPINE WITHOUT CONTRAST TECHNIQUE: Multidetector CT imaging of the head and cervical spine was performed following the standard protocol without intravenous contrast. Multiplanar CT image reconstructions of the cervical spine were also generated. RADIATION DOSE REDUCTION: This exam was performed according to the departmental dose-optimization program which includes automated exposure control, adjustment of the mA and/or kV according to patient size and/or use of iterative reconstruction technique. COMPARISON:  CT head and C-spine 01/12/2023 FINDINGS: CT HEAD FINDINGS Brain: No evidence of large-territorial acute infarction. Chronic few scattered punctate calcifications likely sequelae of prior infection. No parenchymal hemorrhage. No mass lesion. No extra-axial collection. No mass effect or midline shift. No hydrocephalus. Basilar cisterns are patent. Vascular: No hyperdense vessel. Skull: No acute fracture or focal lesion. Sinuses/Orbits: Paranasal sinuses and mastoid air cells are clear. The orbits are unremarkable.  Other: None. CT CERVICAL SPINE FINDINGS Alignment: Mild retrolisthesis of C3 on C4. Skull base and vertebrae: Multilevel moderate degenerative change of the spine. No acute fracture. No aggressive appearing focal osseous lesion or focal pathologic process. Soft tissues and spinal canal: No prevertebral fluid or swelling. No visible canal hematoma. Upper chest: Unremarkable. Other: None. IMPRESSION: 1. No acute intracranial abnormality. 2. No acute displaced fracture or traumatic listhesis of the cervical spine. Electronically Signed   By: Morgane  Naveau M.D.   On: 09/26/2024 19:51   CT CERVICAL SPINE WO CONTRAST Result Date: 09/26/2024 CLINICAL DATA:  Head trauma, moderate-severe; Polytrauma, blunt EXAM: CT HEAD WITHOUT CONTRAST CT CERVICAL SPINE WITHOUT CONTRAST TECHNIQUE: Multidetector CT imaging of the head and cervical spine was performed following the standard protocol without intravenous contrast. Multiplanar CT image reconstructions of the cervical spine were also generated. RADIATION DOSE REDUCTION: This exam was performed according to the departmental dose-optimization program which includes automated exposure control, adjustment of the mA and/or kV according to patient size and/or use of iterative reconstruction technique. COMPARISON:  CT head and C-spine 01/12/2023 FINDINGS: CT HEAD FINDINGS Brain: No evidence of large-territorial acute infarction. Chronic few scattered punctate calcifications likely sequelae of prior infection. No parenchymal hemorrhage. No mass lesion. No extra-axial collection. No mass effect or midline shift. No hydrocephalus. Basilar cisterns are patent. Vascular: No hyperdense vessel. Skull: No acute fracture or focal lesion. Sinuses/Orbits: Paranasal sinuses and mastoid air cells are clear. The orbits are unremarkable. Other: None. CT CERVICAL SPINE FINDINGS Alignment: Mild retrolisthesis of C3 on C4. Skull base and vertebrae: Multilevel moderate degenerative change of the  spine. No acute fracture. No aggressive appearing focal osseous lesion or focal pathologic process. Soft tissues and spinal canal: No prevertebral fluid or swelling. No visible canal hematoma. Upper chest: Unremarkable. Other: None. IMPRESSION: 1. No acute intracranial abnormality. 2. No acute displaced fracture or traumatic listhesis of the cervical spine. Electronically Signed   By: Morgane  Naveau M.D.   On: 09/26/2024 19:51   DG Chest Port 1 View Result Date: 09/26/2024 CLINICAL DATA:  Trauma EXAM: PORTABLE CHEST 1 VIEW COMPARISON:  Chest x-ray 01/12/2023. FINDINGS: The heart and mediastinal contours are unchanged. Atherosclerotic plaque. No focal consolidation. Chronic coarsened interstitial markings with no overt pulmonary edema. No pleural effusion. No pneumothorax. Right anterior shoulder dislocation. IMPRESSION: 1. Right anterior shoulder dislocation.  Limited evaluation. Recommend dedicated right shoulder view for evaluation of possible associated fracture. 2. No acute cardiopulmonary disease. 3.  Aortic Atherosclerosis (ICD10-I70.0). Electronically Signed   By: Morgane  Naveau M.D.   On: 09/26/2024 19:24     .Sedation  Date/Time: 09/26/2024 8:42 PM  Performed by: Randol Simmonds, MD Authorized by: Randol Simmonds, MD   Consent:    Consent obtained:  Verbal   Consent given by:  Patient   Risks discussed:  Allergic reaction, dysrhythmia, inadequate sedation, nausea, prolonged hypoxia resulting in organ damage, prolonged sedation necessitating reversal, respiratory compromise necessitating ventilatory assistance and intubation and vomiting   Alternatives discussed:  Analgesia without sedation, anxiolysis and regional anesthesia Universal protocol:    Procedure explained and questions answered to patient or proxy's satisfaction: yes     Relevant documents present and verified: yes     Test results available: yes     Imaging studies available: yes     Required blood products, implants, devices, and  special equipment available: yes     Site/side marked: yes     Immediately prior to procedure, a time out was called: yes     Patient identity confirmed:  Verbally with patient Indications:    Procedure necessitating sedation performed by:  Physician performing sedation Pre-sedation assessment:    Time since last food or drink:  5   ASA classification: class 1 - normal, healthy patient     Mouth opening:  3 or more finger widths   Thyromental distance:  4 finger widths   Mallampati score:  I - soft palate, uvula, fauces, pillars visible   Neck mobility: normal     Pre-sedation assessments completed and reviewed: airway patency, cardiovascular function, hydration status, mental status, nausea/vomiting, pain level, respiratory function and temperature   A pre-sedation assessment was completed prior to the start of the procedure Immediate pre-procedure details:    Reassessment: Patient reassessed immediately prior to procedure     Reviewed: vital signs, relevant labs/tests and NPO status     Verified: bag valve mask available, emergency equipment available, intubation equipment available, IV patency confirmed, oxygen available and suction available   Procedure details (see MAR for exact dosages):    Preoxygenation:  Nasal cannula   Sedation:  Propofol    Intended level of sedation: deep   Intra-procedure monitoring:  Blood pressure monitoring, cardiac monitor, continuous pulse oximetry, frequent LOC assessments, frequent vital sign checks and continuous capnometry   Intra-procedure events: none     Total Provider sedation time (minutes):  15 Post-procedure details:   A post-sedation assessment was completed following the completion of the procedure.   Attendance: Constant attendance by certified staff until patient recovered     Recovery: Patient returned to pre-procedure baseline     Post-sedation assessments completed and reviewed: airway patency, cardiovascular function, hydration  status, mental status, nausea/vomiting, pain level, respiratory function and temperature     Patient is stable for discharge or admission: yes     Procedure completion:  Tolerated well, no immediate complications .Ortho Injury Treatment  Date/Time: 09/26/2024 9:28 PM  Performed by: Randol Simmonds, MD Authorized by: Randol Simmonds, MD   Consent:    Consent obtained:  Written   Consent given by:  PatientInjury location: shoulder Location details: right shoulder Injury type: dislocation Dislocation type: anterior Chronicity: new Pre-procedure neurovascular assessment: neurovascularly intact  Anesthesia: Local anesthesia used: no  Patient sedated: Yes. Refer to sedation procedure documentation for details of sedation. Manipulation performed: yes Reduction method: traction and counter  traction Reduction successful: yes X-ray confirmed reduction: yes Immobilization: sling Post-procedure neurovascular assessment: post-procedure neurovascularly intact Post-procedure distal perfusion: normal Post-procedure neurological function: normal Post-procedure range of motion: normal      Medications Ordered in the ED  HYDROmorphone  (DILAUDID ) injection 0.5 mg (0.5 mg Intravenous Given 09/26/24 1923)  ondansetron  (ZOFRAN ) injection 4 mg (4 mg Intravenous Given 09/26/24 1923)  HYDROmorphone  (DILAUDID ) injection 1 mg (1 mg Intravenous Given 09/26/24 2032)  propofol  (DIPRIVAN ) 10 mg/mL bolus/IV push 41 mg (41 mg Intravenous Given 09/26/24 2108)    Clinical Course as of 09/26/24 2304  Sun Sep 26, 2024  1946 CBC CBC normal.  Metabolic panel unremarkable lactic acid level normal [JK]  1946 Chest x-ray suggest shoulder dislocation [JK]  2204 Post reduction xray, successful reduction [JK]    Clinical Course User Index [JK] Randol Simmonds, MD                                 Medical Decision Making Problems Addressed: Dislocation of right shoulder joint, initial encounter: acute illness or injury that  poses a threat to life or bodily functions  Amount and/or Complexity of Data Reviewed Labs: ordered. Decision-making details documented in ED Course. Radiology: ordered and independent interpretation performed.  Risk Prescription drug management. Parenteral controlled substances. Drug therapy requiring intensive monitoring for toxicity.   Patient presented to the ED after a fall.  Patient had a mechanical fall when she missed a step.  Patient was primarily complaining of pain in her shoulder.  I was concerned about the possibility of rib fractures or cervical spine fracture.  X-ray imaging does not show any signs of C-spine injury.  No head injury.  No chest injury noted.  Patient however does have a dislocation of her right shoulder.  No signs of fracture.  Patient underwent procedural sedation with reduction of her dislocation.  Postreduction films obtained.  Patient placed in a sling.  11:05 PM  Pt re examined as she felt like her hand was asleep.  Pt able to wiggle fingers, able to range wrist.  Normal cap refill.  It is possibles she may have some neuropraxia after her dislocation.  Will have her follow up with ortho      Final diagnoses:  Dislocation of right shoulder joint, initial encounter    ED Discharge Orders          Ordered    oxyCODONE  (ROXICODONE ) 5 MG immediate release tablet  Every 6 hours PRN        09/26/24 2133               Randol Simmonds, MD 09/26/24 2307

## 2024-09-26 NOTE — Sedation Documentation (Signed)
 Pt being bagged starting at 2111. Spo2 dropped to 50% with 2 L. Improved to 97%

## 2024-09-26 NOTE — Sedation Documentation (Signed)
Pt is alert and oriented at this time.

## 2024-09-26 NOTE — ED Triage Notes (Signed)
 Pt coming in from home. Mechanical fall . Missed a step and caught herself on the way down. Pain to right shoulder   Pt reports blood thinner . Pt is spanish speaking,.  160/70 Hr 60 98% ra

## 2024-10-11 NOTE — Therapy (Signed)
 OUTPATIENT PHYSICAL THERAPY SHOULDER EVALUATION   Patient Name: [name] [name] MRN: [id number] DOB:[birth year], 73 y.o., female Today's Date: 10/12/2024   PT End of Session - 10/12/24 1013     Visit Number 1    PT Start Time 0930    PT Stop Time 1012    PT Time Calculation (min) 42 min          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/2017   Hypertension    Osteoarthritis of ankle, right    and Subtalar Joint   Osteoporosis    Presbyopia of both eyes 01/28/2017   Pterygium eye, bilateral 01/28/2017   Past Surgical History:  Procedure Laterality Date   ANKLE FUSION Right 12/24/2017   Tibiocalcaneal fusion   ANKLE FUSION Right 12/24/2017   Procedure: RIGHT TIBIOCALCANEAL FUSION;  Surgeon: Harden Jerona GAILS, MD;  Location: [facility] OR;  Service: Orthopedics;  Laterality: Right;   CHOLECYSTECTOMY     Patient Active Problem List   Diagnosis Date Noted   Atrial fibrillation (HCC) 10/01/2019   Atrial fibrillation with RVR (HCC) 10/01/2019   Osteopenia of multiple sites 02/05/2019   Hyperlipidemia 02/05/2019   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/2017   Post-traumatic osteoarthritis, right ankle and foot    Seasonal allergic rhinitis due to pollen 05/12/2017   Allergy    Cataract 01/28/2017   Presbyopia of both eyes 01/28/2017   Pterygium eye, bilateral 01/28/2017   Astigmatism, bilateral 01/28/2017   Hypermetropia, bilateral 01/28/2017   Acquired bilateral flat feet 09/07/2015   Osteoporosis 09/07/2015   Elevated troponin 01/26/2015   Generalized anxiety disorder 01/26/2015   Hypertension 07/28/2014   Gastritis 07/28/2014   Tear film insufficiency 01/03/2010   OBESITY 05/10/2009   NEPHROLITHIASIS 06/15/2008   Dyslipidemia 03/16/2008   Spondylosis 01/09/2008   BUNDLE BRANCH  BLOCK, LEFT 12/11/2007   PTSD 10/01/2007   ARM PAIN, LEFT 10/01/2007    PCP: Delbert Clam, MD  REFERRING PROVIDER: Delbert Clam, MD  THERAPY DIAG:  Chronic right shoulder pain  Muscle weakness  REFERRING DIAG: Chronic left shoulder pain [M25.512, G89.29]   Rationale for Evaluation and Treatment:  Rehabilitation  SUBJECTIVE:  PERTINENT PAST HISTORY:  PTSD, osteopenia, Hx of R shoulder dislocation (4 weeks ago)      PRECAUTIONS: Fall, R anterior shoulder dislocation  WEIGHT BEARING RESTRICTIONS No  FALLS:  Has patient fallen in last 6 months? Yes, Number of falls: many times from second step resulting in injury  MOI/History of condition:  Onset date: 1 month  SUBJECTIVE STATEMENT  In person interpreter utilized throughout  Pt is a 73 y.o. female who presents to clinic with chief complaint of R shoulder pain following fall from second step about about 1 week ago.  Was holding rail and arm was forced into end range horizontal abd.  Dislocation and hill-sachs lesion.  No MRI.  Has not had follow up from hospital.  Has been in sling since not using arm.  Endorses clicking and popping and pain with movement.   Red flags:  denies   Pain:  Are you having pain? Yes Pain location: R shoulder pain NPRS scale:  Best: 0/10, Worst: 7/10 Aggravating factors: shoulder movement Relieving factors: rest Pain description: aching  Occupation: NA  Assistive Device: NA  Hand Dominance: R  Patient Goals/Specific Activities: reduce pain   OBJECTIVE:   DIAGNOSTIC FINDINGS:  IMPRESSION: 1. Interval reduction of anteroinferior shoulder dislocation seen on prior study. 2. Hill-Sachs deformity.   GENERAL OBSERVATION: Pt wearing R sling     SENSATION: Light touch: Deficits R hand   PALPATION: Diffuse TTP about R shoulder  UPPER EXTREMITY AROM:  ROM Right (Eval) Left (Eval)  Shoulder flexion Pt spontaneously raised R shoulder to ~80 degrees   Shoulder  abduction    Shoulder internal rotation    Shoulder external rotation    Functional IR    Functional ER    Shoulder extension    Elbow extension    Elbow flexion     (Blank rows = not tested, N = WNL, * = concordant pain with testing)  UPPER EXTREMITY MMT:  MMT Right (Eval) Left (Eval)  Shoulder flexion    Shoulder abduction (C5)    Shoulder ER 3-   Shoulder IR    Middle trapezius    Lower trapezius    Shoulder extension    Grip strength    Cervical flexion (C1,C2)    Cervical S/B (C3)    Shoulder shrug (C4)    Wrist flexion 2+   Wrist ext 2+   Thumb ext (C8)    Finger abd (T1)    Grossly     (Blank rows = not tested, score listed is out of 5 possible points.  N = WNL, D = diminished, C = clear for gross weakness with myotome testing, * = concordant pain with testing)   UPPER EXTREMITY PROM:  PROM Right (Eval) Left (Eval)  Shoulder flexion    Shoulder abduction    Shoulder internal rotation    Shoulder external rotation    Functional IR    Functional ER    Shoulder extension    Elbow extension    Elbow flexion     (Blank rows = not tested, N = WNL, * = concordant pain with testing)    TODAY'S TREATMENT:  Therapeutic Exercise: Creating, reviewing, and completing below HEP   PATIENT EDUCATION ([place]/[facility]):  POC, diagnosis, prognosis, HEP, and outcome measures.  Pt educated via explanation, demonstration, and handout (HEP).  Pt confirms understanding verbally.   HOME EXERCISE PROGRAM: Access Code: [email or web address] URL: [email or web address] Date: 10/12/2024 Prepared by: Helene Gasmen  Exercises - Seated Elbow Flexion and Extension AROM  - 1 x daily - 7 x weekly - 3 sets - 10 reps - Wrist AROM Flexion Extension  - 1 x daily - 7 x weekly - 3 sets - 10 reps  Treatment priorities   Eval                                                  ASSESSMENT:  CLINICAL IMPRESSION: [name] [name] is a 73 y.o. female who presents to clinic with signs and sxs  consistent with R shoulder pain following fall and dislocation about 2 weeks ago.  Pt has not had any follow up following ER visit.  No fracture noted but pt does have hills-sachs lesion noted and given age a higher likelihood of R/C tear.  Significant weakness with wrist flexion and ext needs further investigation as well.  At this point she needs to follow up with ortho or at least PCP for further assessment, particularly given weakness in wrist.  Advised she should continue using R shoulder sling for now.  OBJECTIVE IMPAIRMENTS: Pain, shoulder ROM, shoulder strength  ACTIVITY LIMITATIONS: reaching, lifting, housework, self care  PERSONAL FACTORS: See medical history and pertinent history   REHAB POTENTIAL: Fair  CLINICAL DECISION MAKING: Evolving/moderate complexity  EVALUATION COMPLEXITY: Moderate   GOALS: Not established   PLAN: PT FREQUENCY: 1x visit  PT DURATION: 1x visit  PLANNED INTERVENTIONS:  [id number]- PT Re-evaluation, 97110-Therapeutic exercises, 97530- Therapeutic activity, 97112- Neuromuscular re-education, 97535- Self Care, 02859- Manual therapy, [id number]- Gait training, [id number]- Aquatic Therapy, [id number]- Electrical stimulation (manual), [id number]- Vasopneumatic device, [id number]- Traction (mechanical), [id number]- Ionotophoresis 4mg /ml Dexamethasone , Taping, Dry Needling, Joint manipulation, and Spinal manipulation.   Sheilia Reznick PT, DPT 10/12/2024, 12:16 PM

## 2024-10-12 ENCOUNTER — Other Ambulatory Visit: Payer: Self-pay

## 2024-10-12 ENCOUNTER — Ambulatory Visit: Payer: Self-pay | Admitting: *Deleted

## 2024-10-12 ENCOUNTER — Other Ambulatory Visit: Payer: Self-pay | Admitting: Family Medicine

## 2024-10-12 ENCOUNTER — Ambulatory Visit: Payer: Self-pay | Attending: Family Medicine | Admitting: Physical Therapy

## 2024-10-12 DIAGNOSIS — M25511 Pain in right shoulder: Secondary | ICD-10-CM | POA: Insufficient documentation

## 2024-10-12 DIAGNOSIS — M6281 Muscle weakness (generalized): Secondary | ICD-10-CM | POA: Insufficient documentation

## 2024-10-12 DIAGNOSIS — S43014D Anterior dislocation of right humerus, subsequent encounter: Secondary | ICD-10-CM

## 2024-10-12 DIAGNOSIS — M25512 Pain in left shoulder: Secondary | ICD-10-CM | POA: Insufficient documentation

## 2024-10-12 DIAGNOSIS — G8929 Other chronic pain: Secondary | ICD-10-CM | POA: Insufficient documentation

## 2024-10-12 NOTE — Telephone Encounter (Signed)
 Please advise regarding referral correction and report of patient not using right hand and daughter reports patient has already been seen in ED. CAL notified of daughter request.      FYI Only or Action Required?: Action required by provider: referral request.  Patient was last seen in primary care on 08/12/2024 by Delbert Clam, MD.  Called Nurse Triage reporting Advice Only.  Symptoms began today.  Interventions attempted: Nothing.  Symptoms are: no movement in right hand .  Triage Disposition: Call PCP When Office is Open  Patient/caregiver understands and will follow disposition?: No, wishes to speak with PCP             Copied from CRM #[id number]. Topic: Clinical - Red Word Triage >> Oct 12, 2024 10:42 AM Ivette P wrote: Red Word that prompted transfer to Nurse Triage: pt has no movement in her right hand.  Referral was sent for left shoulder, wrong referral was sent. Reason for Disposition  [1] Caller requesting NON-URGENT health information AND [2] PCP's office is the best resource  Answer Assessment - Initial Assessment Questions Patient daughter requesting call back; Alden # [phone] regarding orthopedic referral. If patient called use interpreter and patient may not answer phone or be able to use phone using left arm per daughter. Patient daughter reports patient went to referral appt today and was told referral was for wrong arm and would not see patient . Patient was scheduled for therapy. Daughter reports patient was to be referred to ortho for right hand. Recommended if patient sx of not using or being able to use right hand go to ED. Patient daughter reports patient was already seen in ED 09/26/24. Please advise.       1. REASON FOR CALL: What is the main reason for your call? or How can I best help you?     Patient daughter Alden, not with patient now; calling to request correct referral to be placed for the right hand immobility for orthopedics  and not left shoulder that was incorrectly placed. 2. SYMPTOMS : Do you have any symptoms?      No movement in right hand since accident or fall from 09/26/24.  3. OTHER QUESTIONS: Do you have any other questions?     Can referral be placed for correct extremity , right hand ASAP. Reports no control over right hand.  Protocols used: Information Only Call - No Triage-A-AH

## 2024-10-13 NOTE — Telephone Encounter (Signed)
 Please see my referral to orthopedic below::  Diagnosis Information  Diagnosis  S43.014D (ICD-10-CM) - Anterior dislocation of right shoulder, subsequent encounter   Referral Notes Number of Notes: 2 .  suggestion  The following note will not be printed.   Type Date User Summary Attachment  Provider Comments 10/12/2024  6:55 PM [name] Orthopedics Referral -  Note: Placed in [name] [name] Ph# [phone] [address] [place], [place] [address] . Type Date User Summary Attachment  Provider Comments 10/12/2024  6:15 PM Delbert Clam, MD Provider Comments -  Note: Right shoulder dislocation post fall     Physical therapy had reached out to me stating due to her inability to use her right arm they would recommend she see orthopedic first and that is why they are not doing physical therapy because they would rather she see orthopedic.  I placed referral as requested above for the correct arm; right shoulder dislocation.  They can call orthopedic to follow-up on this referral.

## 2024-10-13 NOTE — Telephone Encounter (Signed)
 PT referral needs to be changed to correct arm so she can be scheduled.

## 2024-10-14 NOTE — Telephone Encounter (Signed)
 Patient was called and informed and she states that they have an appointment set for Monday with Ortho.

## 2024-10-18 ENCOUNTER — Encounter: Payer: Self-pay | Admitting: Orthopedic Surgery

## 2024-10-19 ENCOUNTER — Other Ambulatory Visit: Payer: Self-pay

## 2024-10-19 ENCOUNTER — Ambulatory Visit: Payer: Self-pay | Admitting: Physician Assistant

## 2024-10-19 DIAGNOSIS — M25511 Pain in right shoulder: Secondary | ICD-10-CM

## 2024-10-19 DIAGNOSIS — G5631 Lesion of radial nerve, right upper limb: Secondary | ICD-10-CM | POA: Insufficient documentation

## 2024-10-19 DIAGNOSIS — M25531 Pain in right wrist: Secondary | ICD-10-CM

## 2024-10-19 NOTE — Progress Notes (Unsigned)
 Office Visit Note   Patient: [name] [name]           Date of Birth: 03-12-1951           MRN: [id number] Visit Date: 10/19/2024              Requested by: Delbert Clam, MD [address] [place],  [place] [address] PCP: Delbert Clam, MD   Assessment & Plan: Visit Diagnoses:  1. Acute pain of right shoulder     Plan: Patient is a pleasant 73 year old woman who is accompanied by her daughter who interprets for her.  She is almost 1 month status post fall onto her right side.  She was seen evaluated emergency room where she had an anterior shoulder dislocation.  This was reduced with sedation in the emergency room.  She was given a sling.  At that time there was noted some weakness of the right wrist.  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  She does tell through her daughter this is slightly better so hopefully this will recover.  In the meantime we will refer her for OT evaluation and give her a wrist splint.  Should do passive range of motion of her right shoulder.  Will follow-up with me in 3 weeks.  Will also arrange for nerve conduction studies.  Showed her exercises for passive range of motion of her wrist.  X-rays of the shoulder at the time showed a Hill-Sachs lesion of note she had an anterior dislocation of her left shoulder last year this was treated with conservative treatment and rehab.  She did have a rotator cuff tear  Follow-Up Instructions: No follow-ups on file.   Orders:  Orders Placed This Encounter  Procedures   XR Shoulder Right   No orders of the defined types were placed in this encounter.     Procedures: No procedures performed   Clinical Data: No additional findings.   Subjective: Chief Complaint  Patient presents with   Right Shoulder - Pain    HPI pleasant 73 year old woman who is almost 1 month status post anterior shoulder dislocation when she fell down  some steps was seen evaluate emergency room where she had an anterior dislocation of the shoulder which was reduced with sedation  Review of Systems  All other systems reviewed and are negative.    Objective: Vital Signs: There were no vitals taken for this visit.  Physical Exam Constitutional:      Appearance: Normal appearance.  Pulmonary:     Effort: Pulmonary effort is normal.  Skin:    General: Skin is warm and dry.  Neurological:     General: No focal deficit present.     Mental Status: She is alert and oriented to person, place, and time.  Psychiatric:        Mood and Affect: Mood normal.    Ortho Exam Examination of her right arm she has minimal tenderness to palpation.  She has no active or minimal active extension flexion of her wrist or fingers on the right side.  She has brisk capillary refill pulses are intact some decrease sensation in the thumb.  But hand is warm. Specialty Comments:  No specialty comments available.  Imaging: No results found.   PMFS History: Patient Active Problem List   Diagnosis Date Noted   Atrial fibrillation (HCC) 10/01/2019   Atrial fibrillation with RVR (HCC) 10/01/2019   Osteopenia of  multiple sites 02/05/2019   Hyperlipidemia 02/05/2019   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/2017   Post-traumatic osteoarthritis, right ankle and foot    Seasonal allergic rhinitis due to pollen 05/12/2017   Allergy    Cataract 01/28/2017   Presbyopia of both eyes 01/28/2017   Pterygium eye, bilateral 01/28/2017   Astigmatism, bilateral 01/28/2017   Hypermetropia, bilateral 01/28/2017   Acquired bilateral flat feet 09/07/2015   Osteoporosis 09/07/2015   Elevated troponin 01/26/2015   Generalized anxiety disorder 01/26/2015   Hypertension 07/28/2014   Gastritis 07/28/2014   Tear film insufficiency 01/03/2010   OBESITY 05/10/2009   NEPHROLITHIASIS 06/15/2008   Dyslipidemia 03/16/2008   Spondylosis 01/09/2008   BUNDLE BRANCH BLOCK, LEFT 12/11/2007    PTSD 10/01/2007   ARM PAIN, LEFT 10/01/2007   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/2017   Hypertension    Osteoarthritis of ankle, right    and Subtalar Joint   Osteoporosis    Presbyopia of both eyes 01/28/2017   Pterygium eye, bilateral 01/28/2017    No family history on file.  Past Surgical History:  Procedure Laterality Date   ANKLE FUSION Right 12/24/2017   Tibiocalcaneal fusion   ANKLE FUSION Right 12/24/2017   Procedure: RIGHT TIBIOCALCANEAL FUSION;  Surgeon: Harden Jerona GAILS, MD;  Location: [facility] OR;  Service: Orthopedics;  Laterality: Right;   CHOLECYSTECTOMY     Social History   Occupational History   Not on file  Tobacco Use   Smoking status: Never   Smokeless tobacco: Never  Vaping Use   Vaping status: Never Used  Substance and Sexual Activity   Alcohol use: No    Alcohol/week: 0.0 standard drinks of alcohol   Drug use: No   Sexual activity: Not Currently

## 2024-10-25 ENCOUNTER — Encounter: Payer: Self-pay | Admitting: Radiology

## 2024-11-09 ENCOUNTER — Encounter: Payer: Self-pay | Admitting: Physical Medicine and Rehabilitation

## 2024-11-09 ENCOUNTER — Telehealth: Payer: Self-pay | Admitting: Physical Medicine and Rehabilitation

## 2024-11-09 ENCOUNTER — Ambulatory Visit: Payer: Self-pay | Admitting: Physician Assistant

## 2024-11-09 NOTE — Telephone Encounter (Signed)
 Pt's daughter called asking if pt can be seen sometime today. Pt daughter Alden  number to call if any one cancels is [phone]. Will call pt if anyone cancels

## 2024-11-12 ENCOUNTER — Ambulatory Visit: Payer: Self-pay | Admitting: Physical Medicine and Rehabilitation

## 2024-11-12 DIAGNOSIS — R202 Paresthesia of skin: Secondary | ICD-10-CM

## 2024-11-12 DIAGNOSIS — M25511 Pain in right shoulder: Secondary | ICD-10-CM

## 2024-11-12 DIAGNOSIS — R29898 Other symptoms and signs involving the musculoskeletal system: Secondary | ICD-10-CM

## 2024-11-12 NOTE — Progress Notes (Unsigned)
 Pain Scale   Average Pain 3 Patient advising she has numbness, tingling and weakness with pain and swelling in her right hand. Patient is Right hand dominate.        +Driver, -BT, -Dye Allergies.

## 2024-11-15 ENCOUNTER — Ambulatory Visit: Payer: Self-pay | Attending: Physician Assistant | Admitting: Occupational Therapy

## 2024-11-15 DIAGNOSIS — M6281 Muscle weakness (generalized): Secondary | ICD-10-CM | POA: Insufficient documentation

## 2024-11-15 DIAGNOSIS — R208 Other disturbances of skin sensation: Secondary | ICD-10-CM | POA: Insufficient documentation

## 2024-11-15 DIAGNOSIS — G8929 Other chronic pain: Secondary | ICD-10-CM | POA: Insufficient documentation

## 2024-11-15 DIAGNOSIS — R278 Other lack of coordination: Secondary | ICD-10-CM | POA: Insufficient documentation

## 2024-11-15 DIAGNOSIS — M25511 Pain in right shoulder: Secondary | ICD-10-CM | POA: Insufficient documentation

## 2024-11-16 ENCOUNTER — Encounter: Payer: Self-pay | Admitting: Physical Medicine and Rehabilitation

## 2024-11-16 NOTE — Progress Notes (Signed)
 [name] [name] [name] [name] - 73 y.o. female MRN [id number]  Date of birth: February 03, 1951  Office Visit Note: Visit Date: 11/12/2024 PCP: Delbert Clam, MD Referred by: Persons, Ronal Dragon, PA  Subjective: Chief Complaint  Patient presents with   Right Hand - Pain, Numbness, Weakness   HPI: [name] [name] is a 73 y.o. female who comes in today at the request of Ronal Dragon Persons, PA-C for evaluation and management of chronic, worsening and severe pain, numbness, weakness and tingling in the Right upper extremities.  Patient is Right hand dominant.  She reports through interpreter and her daughter today that she is about 6 weeks out from fall on the right side that caused a shoulder dislocation anteriorly.  She was seen in emergency department and had this reduced and at the time they had noted some weakness of the hand.  Since that time she has gone to have increasing weakness initially with paresthesias in the arm.  She does report some improvement over time.  She is able to extend the wrist and extend her fingers a lot more than she could before.  She has good movement of the shoulder with good abduction.  She has no symptoms on the left.  She saw Dr. Jerri in the past for left shoulder dislocation.  She denies any frank radicular symptoms.  She does not note any prodromal flu symptoms prior to this and did have a mechanical fall.   I spent more than 30 minutes speaking face-to-face with the patient with 50% of the time in counseling and discussing coordination of care.      Review of Systems  Musculoskeletal:  Positive for joint pain and neck pain.  Neurological:  Positive for tingling and focal weakness.  All other systems reviewed and are negative.  Otherwise per HPI.  Assessment & Plan: Visit Diagnoses:    ICD-10-CM   1. Paresthesia of skin  R20.2 NCV with EMG (electromyography)    2. Acute pain of right shoulder  M25.511     3. Right arm weakness  R29.898     4.  Right hand weakness  R29.898        Plan: Impression: Clinically from the story and on exam the seem to be more of a radial nerve injury or radial nerve palsy and much less likely brachial plexopathy.  Electrodiagnostic study performed today.  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  This does not appear to be a brachial plexopathy.  There is no significant electrodiagnostic evidence of other nerve entrapment, brachial plexopathy or cervical radiculopathy.    Recommendations: 1.  Follow-up with referring physician. 2.  Focused occupational physical therapy along with nerve membrane stabilizing medication such as Lyrica or Cymbalta and possibly referral to upper extremity orthopedic surgery.  Meds & Orders: No orders of the defined types were placed in this encounter.   Orders Placed This Encounter  Procedures   NCV with EMG (electromyography)    Follow-up: Return for Ronal Dragon Persons, PA-C.   Procedures: No procedures performed  EMG & NCV Findings: Evaluation of the right median motor nerve showed reduced amplitude (4.1 mV).  The right radial motor nerve showed prolonged distal onset latency (4.3 ms), reduced amplitude (0.6 mV), and decreased conduction velocity (Up Arm-8cm, 47 m/s).  The right median (across palm) sensory nerve showed prolonged distal peak latency (Palm, 3.3  ms).  All remaining nerves (as indicated in the following tables) were within normal limits.    Significant instability and denervation potentials seen in the right tricep, brachioradialis, extensor digitorum commonness and extensor indices proprius.  No active motor unit action potentials in the extensor indicis proprius. All other examined muscles (as indicated in the following table) showed no evidence of electrical instability.    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  This does not appear to be a brachial plexopathy.  There is no significant electrodiagnostic evidence of other nerve entrapment, brachial plexopathy or cervical radiculopathy.    Recommendations: 1.  Follow-up with referring physician. 2.  Focused occupational physical therapy along with nerve membrane stabilizing medication such as Lyrica or Cymbalta and possibly referral to upper extremity orthopedic surgery.  ___________________________ Prentice Masters FAAPMR Board Certified, American Board of Physical Medicine and Rehabilitation    Nerve Conduction Studies Anti Sensory Summary Table   Stim Site NR Peak (ms) Norm Peak (ms) P-T Amp (V) Norm P-T Amp Site1 Site2 Delta-P (ms) Dist (cm) Vel (m/s) Norm Vel (m/s)  Right Median Acr Palm Anti Sensory (2nd Digit)  30.7C  Wrist    3.6 <3.6 33.1 >10 Wrist Palm 0.3 0.0    Palm    *3.3 <2.0 3.6         Right Radial Anti Sensory (Base 1st Digit)  31C  Wrist    2.1 <3.1 6.8  Wrist Base 1st Digit 2.1 0.0    Site 2    2.1  5.8         Right Ulnar Anti Sensory (5th Digit)  31.2C  Wrist    3.1 <3.7 46.8 >15.0 Wrist 5th Digit 3.1 14.0 45 >38   Motor Summary Table   Stim Site NR Onset (ms) Norm Onset (ms) O-P Amp (mV) Norm O-P Amp Site1 Site2 Delta-0 (ms) Dist (cm) Vel (m/s) Norm Vel (m/s)  Right Median Motor (Abd Poll Brev)  31.6C  Wrist    3.7 <4.2 *4.1 >5 Elbow Wrist 3.4 19.0 56 >50  Elbow    7.1  4.2         Right Radial Motor (Ext Indicis)  31.1C  8cm    *4.3 <2.5 *0.6 >1.7 Up Arm 8cm 4.3 20.0 *47 >60  Up Arm    8.6  0.5         Right Ulnar Motor (Abd Dig Min)  32C  Wrist    3.0 <4.2 6.8 >3 B Elbow Wrist 2.3 15.0 65 >53  B Elbow    5.3  6.2  A Elbow B Elbow 1.2 10.0 83 >53  A Elbow    6.5  5.7          EMG   Side Muscle Nerve Root Ins Act Fibs Psw Amp Dur Poly Recrt Int Bruna Comment  Right Abd Poll  Brev Median C8-T1 Nml Nml Nml Nml Nml 0 Nml Nml   Right 1stDorInt Ulnar C8-T1 Nml Nml Nml Nml Nml 0 Nml Nml   Right ExtIndicis Radial (Post Int) C7-8 Dcr 4+ 4+ Nml Nml 0 --- Nml No MUAP  Right ExtDigCom Radial  Inc 4+ 4+ Nml Nml 0 Rdc Nml   Right BrachioRad Radial C5-6 Inc 3+ 3+ Nml Nml 0 Rdc Nml   Right Triceps Radial C6-7-8 Inc 3+ 3+ Nml Nml 0 Nml Nml   Right  Deltoid Axillary C5-6 Nml Nml Nml Nml Nml 0 Nml Nml     Nerve Conduction Studies Anti Sensory Left/Right Comparison   Stim Site L Lat (ms) R Lat (ms) L-R Lat (ms) L Amp (V) R Amp (V) L-R Amp (%) Site1 Site2 L Vel (m/s) R Vel (m/s) L-R Vel (m/s)  Median Acr Palm Anti Sensory (2nd Digit)  30.7C  Wrist  3.6   33.1  Wrist Palm     Palm  *3.3   3.6        Radial Anti Sensory (Base 1st Digit)  31C  Wrist  2.1   6.8  Wrist Base 1st Digit     Site 2  2.1   5.8        Ulnar Anti Sensory (5th Digit)  31.2C  Wrist  3.1   46.8  Wrist 5th Digit  45    Motor Left/Right Comparison   Stim Site L Lat (ms) R Lat (ms) L-R Lat (ms) L Amp (mV) R Amp (mV) L-R Amp (%) Site1 Site2 L Vel (m/s) R Vel (m/s) L-R Vel (m/s)  Median Motor (Abd Poll Brev)  31.6C  Wrist  3.7   *4.1  Elbow Wrist  56   Elbow  7.1   4.2        Radial Motor (Ext Indicis)  31.1C  8cm  *4.3   *0.6  Up Arm 8cm  *47   Up Arm  8.6   0.5        Ulnar Motor (Abd Dig Min)  32C  Wrist  3.0   6.8  B Elbow Wrist  65   B Elbow  5.3   6.2  A Elbow B Elbow  83   A Elbow  6.5   5.7           Waveforms:             Clinical History: No specialty comments available.   She reports that she has never smoked. She has never used smokeless tobacco.  Recent Labs    08/12/24 0942  HGBA1C 5.7*    Objective:  VS:  HT:    WT:   BMI:     BP:   HR: bpm  TEMP: ( )  RESP:  Physical Exam Vitals and nursing note reviewed.  Constitutional:      General: She is not in acute distress.    Appearance: Normal appearance. She is well-developed. She is not ill-appearing.  HENT:      Head: Normocephalic and atraumatic.  Eyes:     Conjunctiva/sclera: Conjunctivae normal.     Pupils: Pupils are equal, round, and reactive to light.  Cardiovascular:     Rate and Rhythm: Normal rate.     Pulses: Normal pulses.  Pulmonary:     Effort: Pulmonary effort is normal.  Musculoskeletal:        General: Tenderness present.     Right lower leg: No edema.     Left lower leg: No edema.     Comments: Examination of the right upper extremity shows some localized swelling in the hand but without atrophy of the intrinsic hand musculature or APB.  Patient has some difficulty making the A-OK sign on the right.  She has a negative Phalen sign.  She has a negative Hoffmann sign.  She can flex the arm and pretty full strength extension is somewhat weak at the elbow.  She has weakness with wrist extension and long finger extension.  She  has sensation impairment in more of her radial nerve distribution.  She can actively abduct the right arm with good strength in the deltoid.  Skin:    General: Skin is warm and dry.     Findings: No erythema or rash.  Neurological:     General: No focal deficit present.     Mental Status: She is alert and oriented to person, place, and time.     Cranial Nerves: No cranial nerve deficit.     Sensory: Sensory deficit present.     Motor: Weakness present. No abnormal muscle tone.     Coordination: Coordination normal.     Gait: Gait normal.  Psychiatric:        Mood and Affect: Mood normal.        Behavior: Behavior normal.     Ortho Exam  Imaging: No results found.  Past Medical/Family/Surgical/Social History: Medications & Allergies reviewed per EMR, new medications updated. Patient Active Problem List   Diagnosis Date Noted   Acute radial nerve palsy, right 10/19/2024   Atrial fibrillation (HCC) 10/01/2019   Atrial fibrillation with RVR (HCC) 10/01/2019   Osteopenia of multiple sites 02/05/2019   Hyperlipidemia 02/05/2019   S/P ankle fusion  12/24/2017   Post-traumatic osteoarthritis, right ankle and foot    Seasonal allergic rhinitis due to pollen 05/12/2017   Allergy    Cataract 01/28/2017   Presbyopia of both eyes 01/28/2017   Pterygium eye, bilateral 01/28/2017   Astigmatism, bilateral 01/28/2017   Hypermetropia, bilateral 01/28/2017   Acquired bilateral flat feet 09/07/2015   Osteoporosis 09/07/2015   Elevated troponin 01/26/2015   Generalized anxiety disorder 01/26/2015   Hypertension 07/28/2014   Gastritis 07/28/2014   Tear film insufficiency 01/03/2010   OBESITY 05/10/2009   NEPHROLITHIASIS 06/15/2008   Dyslipidemia 03/16/2008   Spondylosis 01/09/2008   BUNDLE BRANCH BLOCK, LEFT 12/11/2007   PTSD 10/01/2007   ARM PAIN, LEFT 10/01/2007   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/2017   Hypertension    Osteoarthritis of ankle, right    and Subtalar Joint   Osteoporosis    Presbyopia of both eyes 01/28/2017   Pterygium eye, bilateral 01/28/2017   History reviewed. No pertinent family history. Past Surgical History:  Procedure Laterality Date   ANKLE FUSION Right 12/24/2017   Tibiocalcaneal fusion   ANKLE FUSION Right 12/24/2017   Procedure: RIGHT TIBIOCALCANEAL FUSION;  Surgeon: Harden Jerona GAILS, MD;  Location: [facility] OR;  Service: Orthopedics;  Laterality: Right;   CHOLECYSTECTOMY     Social History   Occupational History   Not on file  Tobacco Use   Smoking status: Never   Smokeless tobacco: Never  Vaping Use   Vaping status: Never Used  Substance and Sexual Activity   Alcohol use: No    Alcohol/week: 0.0 standard drinks of alcohol   Drug use: No   Sexual activity: Not Currently

## 2024-11-16 NOTE — Therapy (Signed)
 OUTPATIENT OCCUPATIONAL THERAPY ORTHO EVALUATION  Patient Name: [name] [name] MRN: [id number] DOB:29-Oct-1951, 73 y.o., female Today's Date: 11/17/2024  PCP: Delbert Clam, MD REFERRING PROVIDER: Persons, Ronal Dragon, [place]  END OF SESSION:  OT End of Session - 11/17/24 1208     Visit Number 1    Number of Visits 5   including eval   Date for Recertification  12/22/24    Authorization Type Self pay    OT Start Time 0930    OT Stop Time 1019    OT Time Calculation (min) 49 min    Activity Tolerance Patient tolerated treatment well;Patient limited by pain    Behavior During Therapy [facility] for tasks assessed/performed          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/2017   Hypertension    Osteoarthritis of ankle, right    and Subtalar Joint   Osteoporosis    Presbyopia of both eyes 01/28/2017   Pterygium eye, bilateral 01/28/2017   Past Surgical History:  Procedure Laterality Date   ANKLE FUSION Right 12/24/2017   Tibiocalcaneal fusion   ANKLE FUSION Right 12/24/2017   Procedure: RIGHT TIBIOCALCANEAL FUSION;  Surgeon: Harden Jerona GAILS, MD;  Location: [facility] OR;  Service: Orthopedics;  Laterality: Right;   CHOLECYSTECTOMY     Patient Active Problem List   Diagnosis Date Noted   Acute radial nerve palsy, right 10/19/2024   Atrial fibrillation (HCC) 10/01/2019   Atrial fibrillation with RVR (HCC) 10/01/2019   Osteopenia of multiple sites 02/05/2019   Hyperlipidemia 02/05/2019   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/2017   Post-traumatic osteoarthritis, right ankle and foot    Seasonal allergic rhinitis due to pollen 05/12/2017   Allergy    Cataract 01/28/2017   Presbyopia of both eyes 01/28/2017   Pterygium eye, bilateral 01/28/2017   Astigmatism, bilateral 01/28/2017    Hypermetropia, bilateral 01/28/2017   Acquired bilateral flat feet 09/07/2015   Osteoporosis 09/07/2015   Elevated troponin 01/26/2015   Generalized anxiety disorder 01/26/2015   Hypertension 07/28/2014   Gastritis 07/28/2014   Tear film insufficiency 01/03/2010   OBESITY 05/10/2009   NEPHROLITHIASIS 06/15/2008   Dyslipidemia 03/16/2008   Spondylosis 01/09/2008   BUNDLE BRANCH BLOCK, LEFT 12/11/2007   PTSD 10/01/2007   ARM PAIN, LEFT 10/01/2007    ONSET DATE: 10/19/2024 referral date, 09/26/24 inciting incident  REFERRING DIAG: M25.511 (ICD-10-CM) - Acute pain of right shoulder M25.531 (ICD-10-CM) - Pain in right wrist  THERAPY DIAG:  Chronic right shoulder pain  Other lack of coordination  Muscle weakness (generalized)  Other disturbances of skin sensation  Rationale for Evaluation and Treatment: Rehabilitation  SUBJECTIVE:   SUBJECTIVE STATEMENT: I'm doing so so, my hand is hurting but it's moving better than it was. Pt accompanied by: family member and interpreter: Chonda and daughter  PERTINENT HISTORY:  On 09/26/24 pt went to ED. Per ED note:   Patient missed a step tried to catch herself on the way down but ended up injuring her R shoulder, causing anterior R shoulder dislocation. Patient denies any headache or head injury. She is having severe pain in her right shoulder down her arm. Is also on the back part of her arm.  Other PMH includes: arthritis, osteoporosis, HTN hypercholesterolemia, afib  as well as anterior dislocation of L shoulder last year which was treated with conservative tx and rehab.  On 10/19/24 went to ortho, findings were consistent with radial nerve palsy secondary to dislocation. Per referring PA: Will follow-up with me in 3 weeks.  Will also arrange for nerve conduction studies. Showed her exercises for passive range of motion of her wrist   REFER TO IHP PGS 133-134  PRECAUTIONS: None  RED FLAGS: None   WEIGHT BEARING RESTRICTIONS:  No  PAIN:  Are you having pain? No  FALLS: Has patient fallen in last 6 months? Yes. Number of falls 1  LIVING ENVIRONMENT: Lives with: lives with their spouse Lives in: Mobile home Stairs: Yes: External: 3 steps; on right going up Has following equipment at home: Single point cane but doesn't use  PLOF: Independent  PATIENT GOALS: To reduce pain in my arm and not have to take as long to do things. Pt likes to cook and daughter reports she is requiring increased time to do so.  NEXT MD VISIT:   OBJECTIVE:  Note: Objective measures were completed at Evaluation unless otherwise noted.  HAND DOMINANCE: Right  ADLs: Independent, but does require increased time. When she eats she uses her left hand. Fingers in R hand still feel numb.  WFL  FUNCTIONAL OUTCOME MEASURES: Upper Extremity Functional Scale (UEFS): 39/80  UPPER EXTREMITY ROM:     Active ROM Right eval Left eval  Shoulder flexion 135   Shoulder abduction    Shoulder adduction    Shoulder extension    Shoulder internal rotation    Shoulder external rotation    Elbow flexion    Elbow extension    Wrist flexion 30   Wrist extension -10   Wrist ulnar deviation    Wrist radial deviation    Wrist pronation    Wrist supination    (Blank rows = not tested)  Active ROM Right eval Left eval  Thumb MCP (0-60)    Thumb IP (0-80)    Thumb Radial abd/add (0-55)     Thumb Palmar abd/add (0-45)     Thumb Opposition to Small Finger     Index MCP (0-90)     Index PIP (0-100)     Index DIP (0-70)      Long MCP (0-90)      Long PIP (0-100)      Long DIP (0-70)      Ring MCP (0-90)      Ring PIP (0-100)      Ring DIP (0-70)      Little MCP (0-90)      Little PIP (0-100)      Little DIP (0-70)      (Blank rows = not tested)   UPPER EXTREMITY MMT:   3+/5 grossly  MMT Right eval Left eval  Shoulder flexion    Shoulder abduction    Shoulder adduction    Shoulder extension    Shoulder internal rotation     Shoulder external rotation    Middle trapezius    Lower trapezius    Elbow flexion    Elbow extension    Wrist flexion    Wrist extension    Wrist ulnar deviation    Wrist radial deviation    Wrist pronation    Wrist supination    (Blank rows = not tested)  HAND FUNCTION: Grip strength: Right: 8.7 lbs; Left: 28.3 lbs  COORDINATION: 9 Hole Peg test: Right: 51.05 sec; Left: 38.64 sec  Box and Blocks:  Right 24 blocks, Left 46blocks  SENSATION: numbness and tingling in R fingers Numbness and tingling in R fingers  EDEMA: some swelling in R fingers such as long finger  COGNITION: Overall cognitive status: Within functional limits for tasks assessed   OBSERVATIONS: Pt with impaired coordination, ROM and grip strength in dominant R hand requiring increased time to complete ADL/IADL and other desired tasks.   TREATMENT DATE: 11/17/24                                                                                                                           No charge for tx this date d/t self pay. Pt and daughter educated in purpose of OT, goals, and POC. Pt encouraged to continue stretches that have previously been issued. Pt agreed.     PATIENT EDUCATION: Education details: Purpose of OT  Person educated: Patient and Child(ren) Education method: Explanation Education comprehension: verbalized understanding and needs further education  HOME EXERCISE PROGRAM:   GOALS: Goals reviewed with patient? Yes  SHORT TERM GOALS: Target date: 12/10/24  Pt will be independent with HEP for grip, coordination, and ROM of RUE Baseline: New to OP OT Goal status: INITIAL  2.  Pt will be educated in AE for improved ease in ADLs/IADLs prn Baseline: New to OP OT Goal status: INITIAL  3.  Pt will be independent with desensitization techniques for addressing numbness and tingling in R hand Baseline: New to OP OT Goal status: INITIAL  4.  Pt will be educated in modality usage (heat and  cold) for pain relief Baseline: New to OP OT Goal status: INITIAL  5.  Pt will increase R wrist flexion to at least 50 degrees Baseline: 35 degrees Goal status: INITIAL  6.  Pt will be educated in joint protection strategies to protect affected R hand Baseline: New to OP OT Goal status: INITIAL  LONG TERM GOALS: Target date: 12/22/24  Pt will increase UEFS score to at least 50/80 Baseline: 39/80 Goal status: INITIAL  2.  Pt will improve R wrist extension to at least 30 degrees Baseline: -10 degrees Goal status: INITIAL  3.  Pt will increase R shoulder flexion to at least 150 degrees for improved functional reach Baseline: 135 degrees Goal status: INITIAL  4.  Patient will demonstrate at least 15 lbs R grip strength as needed to open jars and other containers.  Baseline Right: 8.7 lbs; Left: 28.3 lbs:  Goal status: INITIAL  5.  Pt will be able to place at least 34 blocks using right hand with completion of Box and Blocks test.  Baseline: Right 24 blocks, Left 46blocks Goal status: INITIAL  6.  Patient will demo improved FM coordination as evidenced by completing nine-hole peg with use of R hand in 41 seconds or less.  Baseline: Right: 51.05 sec; Left: 38.64 sec Goal status: INITIAL  ASSESSMENT:  CLINICAL IMPRESSION: Patient is a 73 y.o. female who was seen today for  occupational therapy evaluation for acute pain of R shoulder and pain in R wrist s/p 09/26/24 fall and anterior dislocation of R shoulder. Hx includes HTN anterior dislocation of L shoulder, hypercholesterolemia, osteoporosis . Patient currently presents below baseline level of functioning demonstrating functional deficits and impairments as noted below. Pt would benefit from skilled OT services in the outpatient setting to work on impairments as noted below to help pt return to PLOF as able.     PERFORMANCE DEFICITS: in functional skills including ADLs, IADLs, coordination, dexterity, edema, tone, ROM, strength,  pain, Fine motor control, decreased knowledge of use of DME, and UE functional use, and psychosocial skills including coping strategies, environmental adaptation, and routines and behaviors.   IMPAIRMENTS: are limiting patient from ADLs, IADLs, and leisure.   COMORBIDITIES: may have co-morbidities  that affects occupational performance. Patient will benefit from skilled OT to address above impairments and improve overall function.  MODIFICATION OR ASSISTANCE TO COMPLETE EVALUATION: Min-Moderate modification of tasks or assist with assess necessary to complete an evaluation.  OT OCCUPATIONAL PROFILE AND HISTORY: Detailed assessment: Review of records and additional review of physical, cognitive, psychosocial history related to current functional performance.  CLINICAL DECISION MAKING: Moderate - several treatment options, min-mod task modification necessary  REHAB POTENTIAL: Fair given existence of cardiopulmonary co-morbidities  EVALUATION COMPLEXITY: Moderate      PLAN:  OT FREQUENCY: 1x/week  OT DURATION: 4 weeks  PLANNED INTERVENTIONS: 97168 OT Re-evaluation, 97535 self care/ADL training, 02889 therapeutic exercise, 97530 therapeutic activity, 97140 manual therapy, 97035 ultrasound, 97010 moist heat, 97010 cryotherapy, 97033 iontophoresis, manual lymph drainage, passive range of motion, energy conservation, coping strategies training, patient/family education, and DME and/or AE instructions  RECOMMENDED OTHER SERVICES: none at this time  CONSULTED AND AGREED WITH PLAN OF CARE: Patient  PLAN FOR NEXT SESSION: Educate in modality usage Educate in edema mgmt for R digits Consider iontophoresis UE HEP with special attention to shoulder flexors, adductors, and internal rotators (also IHP handouts on pgs 249-250)  Coordination and putty HEPs  DOI 09/24/24-DOE 11/17/24 PT IS 7 WEEKS POST INJURY  Janique Hoefer, OT 11/17/2024, 12:08 PM

## 2024-11-16 NOTE — Procedures (Signed)
 EMG & NCV Findings: Evaluation of the right median motor nerve showed reduced amplitude (4.1 mV).  The right radial motor nerve showed prolonged distal onset latency (4.3 ms), reduced amplitude (0.6 mV), and decreased conduction velocity (Up Arm-8cm, 47 m/s).  The right median (across palm) sensory nerve showed prolonged distal peak latency (Palm, 3.3 ms).  All remaining nerves (as indicated in the following tables) were within normal limits.    Significant instability and denervation potentials seen in the right tricep, brachioradialis, extensor digitorum commonness and extensor indices proprius.  No active motor unit action potentials in the extensor indicis proprius. All other examined muscles (as indicated in the following table) showed no evidence of electrical instability.    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  This does not appear to be a brachial plexopathy.  There is no significant electrodiagnostic evidence of other nerve entrapment, brachial plexopathy or cervical radiculopathy.    Recommendations: 1.  Follow-up with referring physician. 2.  Focused occupational physical therapy along with nerve membrane stabilizing medication such as Lyrica or Cymbalta and possibly referral to upper extremity orthopedic surgery.  ___________________________ Prentice Masters FAAPMR Board Certified, American Board of Physical Medicine and Rehabilitation    Nerve Conduction Studies Anti Sensory Summary Table   Stim Site NR Peak (ms) Norm Peak (ms) P-T Amp (V) Norm P-T Amp Site1 Site2 Delta-P (ms) Dist (cm) Vel (m/s) Norm Vel (m/s)  Right Median Acr Palm Anti Sensory (2nd Digit)  30.7C  Wrist    3.6 <3.6 33.1 >10 Wrist Palm 0.3 0.0    Palm    *3.3 <2.0 3.6         Right Radial Anti Sensory (Base 1st Digit)  31C  Wrist    2.1 <3.1 6.8  Wrist Base 1st  Digit 2.1 0.0    Site 2    2.1  5.8         Right Ulnar Anti Sensory (5th Digit)  31.2C  Wrist    3.1 <3.7 46.8 >15.0 Wrist 5th Digit 3.1 14.0 45 >38   Motor Summary Table   Stim Site NR Onset (ms) Norm Onset (ms) O-P Amp (mV) Norm O-P Amp Site1 Site2 Delta-0 (ms) Dist (cm) Vel (m/s) Norm Vel (m/s)  Right Median Motor (Abd Poll Brev)  31.6C  Wrist    3.7 <4.2 *4.1 >5 Elbow Wrist 3.4 19.0 56 >50  Elbow    7.1  4.2         Right Radial Motor (Ext Indicis)  31.1C  8cm    *4.3 <2.5 *0.6 >1.7 Up Arm 8cm 4.3 20.0 *47 >60  Up Arm    8.6  0.5         Right Ulnar Motor (Abd Dig Min)  32C  Wrist    3.0 <4.2 6.8 >3 B Elbow Wrist 2.3 15.0 65 >53  B Elbow    5.3  6.2  A Elbow B Elbow 1.2 10.0 83 >53  A Elbow    6.5  5.7          EMG   Side Muscle Nerve Root Ins Act Fibs Psw Amp Dur Poly Recrt Int Bruna Comment  Right Abd Poll Brev Median C8-T1 Nml Nml Nml Nml Nml 0 Nml Nml   Right 1stDorInt Ulnar C8-T1 Nml Nml Nml Nml Nml 0 Nml Nml   Right ExtIndicis Radial (Post Int)  C7-8 Dcr 4+ 4+ Nml Nml 0 --- Nml No MUAP  Right ExtDigCom Radial  Inc 4+ 4+ Nml Nml 0 Rdc Nml   Right BrachioRad Radial C5-6 Inc 3+ 3+ Nml Nml 0 Rdc Nml   Right Triceps Radial C6-7-8 Inc 3+ 3+ Nml Nml 0 Nml Nml   Right Deltoid Axillary C5-6 Nml Nml Nml Nml Nml 0 Nml Nml     Nerve Conduction Studies Anti Sensory Left/Right Comparison   Stim Site L Lat (ms) R Lat (ms) L-R Lat (ms) L Amp (V) R Amp (V) L-R Amp (%) Site1 Site2 L Vel (m/s) R Vel (m/s) L-R Vel (m/s)  Median Acr Palm Anti Sensory (2nd Digit)  30.7C  Wrist  3.6   33.1  Wrist Palm     Palm  *3.3   3.6        Radial Anti Sensory (Base 1st Digit)  31C  Wrist  2.1   6.8  Wrist Base 1st Digit     Site 2  2.1   5.8        Ulnar Anti Sensory (5th Digit)  31.2C  Wrist  3.1   46.8  Wrist 5th Digit  45    Motor Left/Right Comparison   Stim Site L Lat (ms) R Lat (ms) L-R Lat (ms) L Amp (mV) R Amp (mV) L-R Amp (%) Site1 Site2 L Vel (m/s) R Vel (m/s) L-R Vel (m/s)   Median Motor (Abd Poll Brev)  31.6C  Wrist  3.7   *4.1  Elbow Wrist  56   Elbow  7.1   4.2        Radial Motor (Ext Indicis)  31.1C  8cm  *4.3   *0.6  Up Arm 8cm  *47   Up Arm  8.6   0.5        Ulnar Motor (Abd Dig Min)  32C  Wrist  3.0   6.8  B Elbow Wrist  65   B Elbow  5.3   6.2  A Elbow B Elbow  83   A Elbow  6.5   5.7           Waveforms:

## 2024-11-17 ENCOUNTER — Telehealth: Payer: Self-pay

## 2024-11-17 ENCOUNTER — Other Ambulatory Visit: Payer: Self-pay

## 2024-11-17 ENCOUNTER — Ambulatory Visit: Payer: Self-pay

## 2024-11-17 DIAGNOSIS — M6281 Muscle weakness (generalized): Secondary | ICD-10-CM

## 2024-11-17 DIAGNOSIS — R208 Other disturbances of skin sensation: Secondary | ICD-10-CM

## 2024-11-17 DIAGNOSIS — G8929 Other chronic pain: Secondary | ICD-10-CM

## 2024-11-17 DIAGNOSIS — R278 Other lack of coordination: Secondary | ICD-10-CM

## 2024-11-17 NOTE — Telephone Encounter (Signed)
 Copied from CRM #[id number]. Topic: General - Other >> Nov 17, 2024 11:20 AM Pinkey ORN wrote: Patient is requesting an application for the orange card. Patient is also requesting to be transferred to a closer location (near [place]) for therapy.

## 2024-11-17 NOTE — Telephone Encounter (Signed)
 Noted

## 2024-12-01 ENCOUNTER — Encounter: Payer: Self-pay | Admitting: Physical Medicine and Rehabilitation

## 2024-12-01 ENCOUNTER — Ambulatory Visit: Payer: Self-pay

## 2024-12-03 ENCOUNTER — Ambulatory Visit (INDEPENDENT_AMBULATORY_CARE_PROVIDER_SITE_OTHER): Payer: Self-pay | Admitting: Physician Assistant

## 2024-12-03 DIAGNOSIS — S43014A Anterior dislocation of right humerus, initial encounter: Secondary | ICD-10-CM | POA: Insufficient documentation

## 2024-12-03 NOTE — Progress Notes (Signed)
 Office Visit Note   Patient: [name] [name]           Date of Birth: March 13, 1951           MRN: [id number] Visit Date: 12/03/2024              Requested by: Delbert Clam, MD [address] [place],  [place] [address] PCP: Delbert Clam, MD  Chief Complaint  Patient presents with   Right Shoulder - Follow-up   Right Wrist - Follow-up      HPI: Patient is a 73 year old woman seen with the help of interpreter today.  She saw me about a month after an anterior shoulder dislocation for concerns for wrist weakness in her right hand.  I referred her for electrodiagnostic studies and referred her to occupational therapy.  Her daughter says she is not sure they had her mom's financial assistance available and they were telling them it was $200 co-pay.  Assessment & Plan: Visit Diagnoses:  1. Closed anterior dislocation of right shoulder, initial encounter     Plan: She has significant improvement.  We reviewed the diagnostic studies which demonstrated a severe radial neuropathy but not brachial plexus injury.  At this point I encouraged her to try occupational therapy for her right shoulder and her right wrist I think she is going to continue to do well follow-up for final visit in 6 weeks  Follow-Up Instructions: Return in about 6 weeks (around 01/14/2025).   Ortho Exam  Patient is alert, oriented, no adenopathy, well-dressed, normal affect, normal respiratory effort. Examination of her right wrist she does have active extension and flexion pulses are intact swelling has resolved.  She can elevate her arm actively to 100 degrees.    Imaging: No results found. No images are attached to the encounter.  Labs: Lab Results  Component Value Date   HGBA1C 5.7 (H) 08/12/2024   HGBA1C 5.7 (H) 11/05/2023   HGBA1C 5.7 (H) 09/05/2022   ESRSEDRATE 20 09/17/2007   REPTSTATUS 01/11/2008 FINAL 01/09/2008   CULT  01/09/2008    LACTOBACILLUS SPECIES Note:  Standardized susceptibility testing for this organism is not available.   LABORGA Normal Upper Respiratory Flora 07/28/2014   LABORGA No Beta Hemolytic Streptococci Isolated 07/28/2014     Lab Results  Component Value Date   ALBUMIN 3.5 09/26/2024   ALBUMIN 4.1 08/12/2024   ALBUMIN 4.2 05/06/2023    Lab Results  Component Value Date   MG 2.3 10/01/2019   Lab Results  Component Value Date   VD25OH 27.3 (L) 08/12/2024    No results found for: PREALBUMIN    Latest Ref Rng & Units 09/26/2024    7:09 PM 09/26/2024    7:01 PM 08/12/2024    9:42 AM  CBC EXTENDED  WBC 4.0 - 10.5 K/uL  6.4  5.7   RBC 3.87 - 5.11 MIL/uL  4.07  4.32   Hemoglobin 12.0 - 15.0 g/dL 87.0  87.0  86.2   HCT 36.0 - 46.0 % 38.0  38.5  41.4   Platelets 150 - 400 K/uL  281  252   NEUT# 1.4 - 7.0 x10E3/uL   3.6   Lymph# 0.7 - 3.1 x10E3/uL   1.5      There is no height or weight on file to calculate BMI.  Orders:  No orders of the defined types were placed in this encounter.  No orders of the defined types were placed in this encounter.  Procedures: No procedures performed  Clinical Data: No additional findings.  ROS:  All other systems negative, except as noted in the HPI. Review of Systems  Objective: Vital Signs: There were no vitals taken for this visit.  Specialty Comments:  No specialty comments available.  PMFS History: Patient Active Problem List   Diagnosis Date Noted   Closed anterior dislocation of right shoulder 12/03/2024   Acute radial nerve palsy, right 10/19/2024   Atrial fibrillation (HCC) 10/01/2019   Atrial fibrillation with RVR (HCC) 10/01/2019   Osteopenia of multiple sites 02/05/2019   Hyperlipidemia 02/05/2019   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/2017   Post-traumatic osteoarthritis, right ankle and foot    Seasonal allergic rhinitis due to pollen 05/12/2017   Allergy    Cataract 01/28/2017   Presbyopia of both eyes 01/28/2017   Pterygium eye, bilateral 01/28/2017    Astigmatism, bilateral 01/28/2017   Hypermetropia, bilateral 01/28/2017   Acquired bilateral flat feet 09/07/2015   Osteoporosis 09/07/2015   Elevated troponin 01/26/2015   Generalized anxiety disorder 01/26/2015   Hypertension 07/28/2014   Gastritis 07/28/2014   Tear film insufficiency 01/03/2010   OBESITY 05/10/2009   NEPHROLITHIASIS 06/15/2008   Dyslipidemia 03/16/2008   Spondylosis 01/09/2008   BUNDLE BRANCH BLOCK, LEFT 12/11/2007   PTSD 10/01/2007   ARM PAIN, LEFT 10/01/2007   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/2017   Hypertension    Osteoarthritis of ankle, right    and Subtalar Joint   Osteoporosis    Presbyopia of both eyes 01/28/2017   Pterygium eye, bilateral 01/28/2017    No family history on file.  Past Surgical History:  Procedure Laterality Date   ANKLE FUSION Right 12/24/2017   Tibiocalcaneal fusion   ANKLE FUSION Right 12/24/2017   Procedure: RIGHT TIBIOCALCANEAL FUSION;  Surgeon: Harden Jerona GAILS, MD;  Location: [facility] OR;  Service: Orthopedics;  Laterality: Right;   CHOLECYSTECTOMY     Social History   Occupational History   Not on file  Tobacco Use   Smoking status: Never   Smokeless tobacco: Never  Vaping Use   Vaping status: Never Used  Substance and Sexual Activity   Alcohol use: No    Alcohol/week: 0.0 standard drinks of alcohol   Drug use: No   Sexual activity: Not Currently

## 2024-12-07 ENCOUNTER — Ambulatory Visit: Payer: Self-pay | Attending: Physician Assistant | Admitting: Occupational Therapy

## 2024-12-07 ENCOUNTER — Ambulatory Visit: Payer: Self-pay | Admitting: Physician Assistant

## 2024-12-14 ENCOUNTER — Ambulatory Visit: Payer: Self-pay | Admitting: Occupational Therapy

## 2024-12-14 NOTE — Therapy (Incomplete)
 " OUTPATIENT OCCUPATIONAL THERAPY ORTHO EVALUATION  Patient Name: [name] [name] MRN: [id number] DOB:13-Mar-1951, 73 y.o., female Today's Date: 12/14/2024  PCP: Delbert Clam, MD REFERRING PROVIDER: Persons, Ronal Dragon, [place]  END OF SESSION:    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/2017   Hypertension    Osteoarthritis of ankle, right    and Subtalar Joint   Osteoporosis    Presbyopia of both eyes 01/28/2017   Pterygium eye, bilateral 01/28/2017   Past Surgical History:  Procedure Laterality Date   ANKLE FUSION Right 12/24/2017   Tibiocalcaneal fusion   ANKLE FUSION Right 12/24/2017   Procedure: RIGHT TIBIOCALCANEAL FUSION;  Surgeon: Harden Jerona GAILS, MD;  Location: [facility] OR;  Service: Orthopedics;  Laterality: Right;   CHOLECYSTECTOMY     Patient Active Problem List   Diagnosis Date Noted   Closed anterior dislocation of right shoulder 12/03/2024   Acute radial nerve palsy, right 10/19/2024   Atrial fibrillation (HCC) 10/01/2019   Atrial fibrillation with RVR (HCC) 10/01/2019   Osteopenia of multiple sites 02/05/2019   Hyperlipidemia 02/05/2019   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/2017   Post-traumatic osteoarthritis, right ankle and foot    Seasonal allergic rhinitis due to pollen 05/12/2017   Allergy    Cataract 01/28/2017   Presbyopia of both eyes 01/28/2017   Pterygium eye, bilateral 01/28/2017   Astigmatism, bilateral 01/28/2017   Hypermetropia, bilateral 01/28/2017   Acquired bilateral flat feet 09/07/2015   Osteoporosis 09/07/2015   Elevated troponin 01/26/2015   Generalized anxiety disorder 01/26/2015   Hypertension 07/28/2014   Gastritis 07/28/2014   Tear film insufficiency 01/03/2010   OBESITY 05/10/2009   NEPHROLITHIASIS 06/15/2008   Dyslipidemia  03/16/2008   Spondylosis 01/09/2008   BUNDLE BRANCH BLOCK, LEFT 12/11/2007   PTSD 10/01/2007   ARM PAIN, LEFT 10/01/2007    ONSET DATE: 10/19/2024 referral date, 09/26/24 inciting incident  REFERRING DIAG: M25.511 (ICD-10-CM) - Acute pain of right shoulder M25.531 (ICD-10-CM) - Pain in right wrist  THERAPY DIAG:  No diagnosis found.  Rationale for Evaluation and Treatment: Rehabilitation  SUBJECTIVE:   SUBJECTIVE STATEMENT: I'm doing so so, my hand is hurting but it's moving better than it was. Pt accompanied by: family member and interpreter: Rashawna and daughter  PERTINENT HISTORY:  On 09/26/24 pt went to ED. Per ED note:   Patient missed a step tried to catch herself on the way down but ended up injuring her R shoulder, causing anterior R shoulder dislocation. Patient denies any headache or head injury. She is having severe pain in her right shoulder down her arm. Is also on the back part of her arm.  Other PMH includes: arthritis, osteoporosis, HTN hypercholesterolemia, afib as well as anterior dislocation of L shoulder last year which was treated with conservative tx and rehab.  On 10/19/24 went to ortho, findings were consistent with radial nerve palsy secondary to dislocation. Per referring PA: Will follow-up with me in 3 weeks.  Will also arrange for nerve conduction studies. Showed her exercises for passive range of motion of her wrist   REFER TO IHP PGS 133-134  PRECAUTIONS: None  RED FLAGS: None   WEIGHT BEARING RESTRICTIONS: No  PAIN:  Are you having pain? No  FALLS: Has patient fallen in  last 6 months? Yes. Number of falls 1  LIVING ENVIRONMENT: Lives with: lives with their spouse Lives in: Mobile home Stairs: Yes: External: 3 steps; on right going up Has following equipment at home: Single point cane but doesn't use  PLOF: Independent  PATIENT GOALS: To reduce pain in my arm and not have to take as long to do things. Pt likes to cook and daughter reports  she is requiring increased time to do so.  NEXT MD VISIT:   OBJECTIVE:  Note: Objective measures were completed at Evaluation unless otherwise noted.  HAND DOMINANCE: Right  ADLs: Independent, but does require increased time. When she eats she uses her left hand. Fingers in R hand still feel numb.  WFL  FUNCTIONAL OUTCOME MEASURES: Upper Extremity Functional Scale (UEFS): 39/80  UPPER EXTREMITY ROM:     Active ROM Right eval Left eval  Shoulder flexion 135   Shoulder abduction    Shoulder adduction    Shoulder extension    Shoulder internal rotation    Shoulder external rotation    Elbow flexion    Elbow extension    Wrist flexion 30   Wrist extension -10   Wrist ulnar deviation    Wrist radial deviation    Wrist pronation    Wrist supination    (Blank rows = not tested)  Active ROM Right eval Left eval  Thumb MCP (0-60)    Thumb IP (0-80)    Thumb Radial abd/add (0-55)     Thumb Palmar abd/add (0-45)     Thumb Opposition to Small Finger     Index MCP (0-90)     Index PIP (0-100)     Index DIP (0-70)      Long MCP (0-90)      Long PIP (0-100)      Long DIP (0-70)      Ring MCP (0-90)      Ring PIP (0-100)      Ring DIP (0-70)      Little MCP (0-90)      Little PIP (0-100)      Little DIP (0-70)      (Blank rows = not tested)   UPPER EXTREMITY MMT:   3+/5 grossly  MMT Right eval Left eval  Shoulder flexion    Shoulder abduction    Shoulder adduction    Shoulder extension    Shoulder internal rotation    Shoulder external rotation    Middle trapezius    Lower trapezius    Elbow flexion    Elbow extension    Wrist flexion    Wrist extension    Wrist ulnar deviation    Wrist radial deviation    Wrist pronation    Wrist supination    (Blank rows = not tested)  HAND FUNCTION: Grip strength: Right: 8.7 lbs; Left: 28.3 lbs  COORDINATION: 9 Hole Peg test: Right: 51.05 sec; Left: 38.64 sec Box and Blocks:  Right 24 blocks, Left  46blocks  SENSATION: numbness and tingling in R fingers Numbness and tingling in R fingers  EDEMA: some swelling in R fingers such as long finger  COGNITION: Overall cognitive status: Within functional limits for tasks assessed   OBSERVATIONS: Pt with impaired coordination, ROM and grip strength in dominant R hand requiring increased time to complete ADL/IADL and other desired tasks.   TREATMENT DATE:  12/14/24    Next treatment***  Heat before exercise - provide with handout  Shoulder flexion short arc, full IR and  external rotation to tolerance with arm adducted to side of the body,   Isometrics for biceps and triceps with elbow flexion to 90*  Full arc AROM for shoulder , shoulder internally rotated with shoulder abduction  Scapular stabilizers  Bands if above go well  11/17/24                                                                                                                           No charge for tx this date d/t self pay. Pt and daughter educated in purpose of OT, goals, and POC. Pt encouraged to continue stretches that have previously been issued. Pt agreed.     PATIENT EDUCATION: Education details: Purpose of OT  Person educated: Patient and Child(ren) Education method: Explanation Education comprehension: verbalized understanding and needs further education  HOME EXERCISE PROGRAM:   GOALS: Goals reviewed with patient? Yes  SHORT TERM GOALS: Target date: 12/10/24  Pt will be independent with HEP for grip, coordination, and ROM of RUE Baseline: New to OP OT Goal status: INITIAL  2.  Pt will be educated in AE for improved ease in ADLs/IADLs prn Baseline: New to OP OT Goal status: INITIAL  3.  Pt will be independent with desensitization techniques for addressing numbness and tingling in R hand Baseline: New to OP OT Goal status: INITIAL  4.  Pt will be educated in modality usage (heat and cold) for pain relief Baseline: New to OP  OT Goal status: INITIAL  5.  Pt will increase R wrist flexion to at least 50 degrees Baseline: 35 degrees Goal status: INITIAL  6.  Pt will be educated in joint protection strategies to protect affected R hand Baseline: New to OP OT Goal status: INITIAL  LONG TERM GOALS: Target date: 12/22/24  Pt will increase UEFS score to at least 50/80 Baseline: 39/80 Goal status: INITIAL  2.  Pt will improve R wrist extension to at least 30 degrees Baseline: -10 degrees Goal status: INITIAL  3.  Pt will increase R shoulder flexion to at least 150 degrees for improved functional reach Baseline: 135 degrees Goal status: INITIAL  4.  Patient will demonstrate at least 15 lbs R grip strength as needed to open jars and other containers.  Baseline Right: 8.7 lbs; Left: 28.3 lbs:  Goal status: INITIAL  5.  Pt will be able to place at least 34 blocks using right hand with completion of Box and Blocks test.  Baseline: Right 24 blocks, Left 46blocks Goal status: INITIAL  6.  Patient will demo improved FM coordination as evidenced by completing nine-hole peg with use of R hand in 41 seconds or less.  Baseline: Right: 51.05 sec; Left: 38.64 sec Goal status: INITIAL  ASSESSMENT:  CLINICAL IMPRESSION: Patient is a 73 y.o. female who was seen today for occupational therapy evaluation for acute pain of R shoulder and pain in R wrist s/p 09/26/24 fall and anterior dislocation of R shoulder. Hx includes HTN anterior  dislocation of L shoulder, hypercholesterolemia, osteoporosis . Patient currently presents below baseline level of functioning demonstrating functional deficits and impairments as noted below. Pt would benefit from skilled OT services in the outpatient setting to work on impairments as noted below to help pt return to PLOF as able.     PERFORMANCE DEFICITS: in functional skills including ADLs, IADLs, coordination, dexterity, edema, tone, ROM, strength, pain, Fine motor control, decreased  knowledge of use of DME, and UE functional use, and psychosocial skills including coping strategies, environmental adaptation, and routines and behaviors.   IMPAIRMENTS: are limiting patient from ADLs, IADLs, and leisure.   COMORBIDITIES: may have co-morbidities  that affects occupational performance. Patient will benefit from skilled OT to address above impairments and improve overall function.  MODIFICATION OR ASSISTANCE TO COMPLETE EVALUATION: Min-Moderate modification of tasks or assist with assess necessary to complete an evaluation.  OT OCCUPATIONAL PROFILE AND HISTORY: Detailed assessment: Review of records and additional review of physical, cognitive, psychosocial history related to current functional performance.  CLINICAL DECISION MAKING: Moderate - several treatment options, min-mod task modification necessary  REHAB POTENTIAL: Fair given existence of cardiopulmonary co-morbidities  EVALUATION COMPLEXITY: Moderate      PLAN:  OT FREQUENCY: 1x/week  OT DURATION: 4 weeks  PLANNED INTERVENTIONS: 97168 OT Re-evaluation, 97535 self care/ADL training, 02889 therapeutic exercise, 97530 therapeutic activity, 97140 manual therapy, 97035 ultrasound, 97010 moist heat, 97010 cryotherapy, 97033 iontophoresis, manual lymph drainage, passive range of motion, energy conservation, coping strategies training, patient/family education, and DME and/or AE instructions  RECOMMENDED OTHER SERVICES: none at this time  CONSULTED AND AGREED WITH PLAN OF CARE: Patient  PLAN FOR NEXT SESSION: Educate in modality usage Educate in edema mgmt for R digits Consider iontophoresis UE HEP with special attention to shoulder flexors, adductors, and internal rotators (also IHP handouts on pgs 249-250)  Coordination and putty HEPs  DOI 09/24/24-DOE 11/17/24 PT IS 7 WEEKS POST INJURY  Bevan Disney, OT 12/14/2024, 8:48 AM   "

## 2024-12-22 ENCOUNTER — Ambulatory Visit: Payer: Self-pay | Admitting: Occupational Therapy

## 2024-12-27 ENCOUNTER — Other Ambulatory Visit: Payer: Self-pay

## 2024-12-27 ENCOUNTER — Other Ambulatory Visit: Payer: Self-pay | Admitting: Family Medicine

## 2024-12-27 DIAGNOSIS — I10 Essential (primary) hypertension: Secondary | ICD-10-CM

## 2024-12-27 DIAGNOSIS — E785 Hyperlipidemia, unspecified: Secondary | ICD-10-CM

## 2024-12-27 MED ORDER — ROSUVASTATIN CALCIUM 20 MG PO TABS
20.0000 mg | ORAL_TABLET | Freq: Every day | ORAL | 0 refills | Status: AC
  Filled 2024-12-27: qty 90, 90d supply, fill #0

## 2024-12-27 MED ORDER — LISINOPRIL-HYDROCHLOROTHIAZIDE 20-25 MG PO TABS
1.0000 | ORAL_TABLET | Freq: Every day | ORAL | 0 refills | Status: AC
  Filled 2024-12-27 (×2): qty 90, 90d supply, fill #0

## 2025-01-14 ENCOUNTER — Ambulatory Visit: Payer: Self-pay | Admitting: Physician Assistant

## 2025-01-21 ENCOUNTER — Ambulatory Visit: Payer: Self-pay | Admitting: Physician Assistant

## 2025-02-14 ENCOUNTER — Ambulatory Visit: Payer: Self-pay | Admitting: Family Medicine
